# Patient Record
Sex: Female | Born: 1940 | Race: Black or African American | Hispanic: No | Smoking: Never smoker
Health system: Southern US, Community
[De-identification: ages and names within clinical notes are randomized; demographics above are authoritative.]

## PROBLEM LIST (undated history)

## (undated) ENCOUNTER — Ambulatory Visit: Disposition: A | Discharge status: Home / Self Care | Payer: PPO

## (undated) DIAGNOSIS — E119 Type 2 diabetes mellitus without complications: Secondary | ICD-10-CM

## (undated) DIAGNOSIS — I10 Essential (primary) hypertension: Secondary | ICD-10-CM

## (undated) DIAGNOSIS — M179 Osteoarthritis of knee, unspecified: Secondary | ICD-10-CM

## (undated) DIAGNOSIS — E785 Hyperlipidemia, unspecified: Secondary | ICD-10-CM

## (undated) DIAGNOSIS — D179 Benign lipomatous neoplasm, unspecified: Secondary | ICD-10-CM

## (undated) DIAGNOSIS — M1710 Unilateral primary osteoarthritis, unspecified knee: Secondary | ICD-10-CM

## (undated) HISTORY — DX: Essential (primary) hypertension: I10

## (undated) HISTORY — DX: Hyperlipidemia, unspecified: E78.5

## (undated) HISTORY — DX: Benign lipomatous neoplasm, unspecified: D17.9

## (undated) HISTORY — DX: Type 2 diabetes mellitus without complications: E11.9

## (undated) HISTORY — DX: Unilateral primary osteoarthritis, unspecified knee: M17.10

## (undated) HISTORY — DX: Osteoarthritis of knee, unspecified: M17.9

---
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
## 1968-09-24 HISTORY — PX: ACNE CYST REMOVAL: SUR1112

## 1983-09-25 HISTORY — PX: VAGINAL HYSTERECTOMY: SUR661

## 1983-09-25 HISTORY — PX: OOPHORECTOMY: SHX86

## 2002-12-08 ENCOUNTER — Observation Stay (HOSPITAL_COMMUNITY): Admission: EM | Admit: 2002-12-08 | Discharge: 2002-12-09 | Payer: Self-pay | Admitting: Emergency Medicine

## 2002-12-08 ENCOUNTER — Encounter: Payer: Self-pay | Admitting: Emergency Medicine

## 2002-12-09 ENCOUNTER — Encounter: Payer: Self-pay | Admitting: Internal Medicine

## 2003-04-20 ENCOUNTER — Encounter: Payer: Self-pay | Admitting: Emergency Medicine

## 2003-04-20 ENCOUNTER — Emergency Department (HOSPITAL_COMMUNITY): Admission: EM | Admit: 2003-04-20 | Discharge: 2003-04-20 | Payer: Self-pay | Admitting: Emergency Medicine

## 2004-09-27 ENCOUNTER — Ambulatory Visit: Payer: Self-pay | Admitting: Internal Medicine

## 2004-10-18 ENCOUNTER — Ambulatory Visit: Payer: Self-pay | Admitting: Internal Medicine

## 2004-10-26 ENCOUNTER — Ambulatory Visit: Payer: Self-pay | Admitting: Internal Medicine

## 2004-11-06 ENCOUNTER — Encounter: Admission: RE | Admit: 2004-11-06 | Discharge: 2005-02-04 | Payer: Self-pay | Admitting: Internal Medicine

## 2004-11-14 ENCOUNTER — Ambulatory Visit: Payer: Self-pay | Admitting: Internal Medicine

## 2004-11-21 ENCOUNTER — Encounter: Admission: RE | Admit: 2004-11-21 | Discharge: 2004-11-21 | Payer: Self-pay | Admitting: Internal Medicine

## 2005-01-31 ENCOUNTER — Ambulatory Visit: Payer: Self-pay | Admitting: Internal Medicine

## 2005-03-19 ENCOUNTER — Ambulatory Visit: Payer: Self-pay | Admitting: Internal Medicine

## 2005-04-02 ENCOUNTER — Ambulatory Visit: Payer: Self-pay | Admitting: Cardiology

## 2005-06-11 ENCOUNTER — Ambulatory Visit: Payer: Self-pay | Admitting: Internal Medicine

## 2005-06-18 ENCOUNTER — Ambulatory Visit: Payer: Self-pay | Admitting: Internal Medicine

## 2005-08-22 ENCOUNTER — Ambulatory Visit: Payer: Self-pay | Admitting: Internal Medicine

## 2006-04-22 ENCOUNTER — Ambulatory Visit: Payer: Self-pay | Admitting: Endocrinology

## 2006-05-13 ENCOUNTER — Ambulatory Visit: Payer: Self-pay | Admitting: Internal Medicine

## 2006-06-10 ENCOUNTER — Ambulatory Visit: Payer: Self-pay | Admitting: Internal Medicine

## 2006-09-09 ENCOUNTER — Ambulatory Visit: Payer: Self-pay | Admitting: Internal Medicine

## 2007-04-19 DIAGNOSIS — E119 Type 2 diabetes mellitus without complications: Secondary | ICD-10-CM | POA: Insufficient documentation

## 2007-04-19 DIAGNOSIS — I10 Essential (primary) hypertension: Secondary | ICD-10-CM | POA: Insufficient documentation

## 2007-04-19 DIAGNOSIS — M1990 Unspecified osteoarthritis, unspecified site: Secondary | ICD-10-CM | POA: Insufficient documentation

## 2007-04-19 DIAGNOSIS — J309 Allergic rhinitis, unspecified: Secondary | ICD-10-CM | POA: Insufficient documentation

## 2007-08-05 ENCOUNTER — Encounter: Payer: Self-pay | Admitting: Internal Medicine

## 2008-06-05 ENCOUNTER — Ambulatory Visit: Payer: Self-pay | Admitting: Family Medicine

## 2008-06-05 DIAGNOSIS — J019 Acute sinusitis, unspecified: Secondary | ICD-10-CM | POA: Insufficient documentation

## 2008-09-11 ENCOUNTER — Telehealth: Payer: Self-pay | Admitting: Family Medicine

## 2008-09-13 ENCOUNTER — Telehealth: Payer: Self-pay | Admitting: Internal Medicine

## 2008-09-15 ENCOUNTER — Ambulatory Visit: Payer: Self-pay | Admitting: Internal Medicine

## 2008-09-15 DIAGNOSIS — E785 Hyperlipidemia, unspecified: Secondary | ICD-10-CM | POA: Insufficient documentation

## 2008-10-05 ENCOUNTER — Telehealth: Payer: Self-pay | Admitting: Internal Medicine

## 2009-05-18 ENCOUNTER — Ambulatory Visit: Payer: Self-pay | Admitting: Internal Medicine

## 2009-05-18 ENCOUNTER — Telehealth: Payer: Self-pay | Admitting: Internal Medicine

## 2009-05-18 DIAGNOSIS — R05 Cough: Secondary | ICD-10-CM

## 2009-05-18 DIAGNOSIS — B351 Tinea unguium: Secondary | ICD-10-CM | POA: Insufficient documentation

## 2009-05-18 DIAGNOSIS — R059 Cough, unspecified: Secondary | ICD-10-CM | POA: Insufficient documentation

## 2009-05-18 LAB — CONVERTED CEMR LAB
ALT: 21 units/L (ref 0–35)
Alkaline Phosphatase: 77 units/L (ref 39–117)
BUN: 13 mg/dL (ref 6–23)
Bilirubin Urine: NEGATIVE
Bilirubin, Direct: 0.2 mg/dL (ref 0.0–0.3)
Chloride: 101 meq/L (ref 96–112)
Eosinophils Relative: 3 % (ref 0.0–5.0)
Glucose, Bld: 112 mg/dL — ABNORMAL HIGH (ref 70–99)
HCT: 36.2 % (ref 36.0–46.0)
MCV: 85.3 fL (ref 78.0–100.0)
Nitrite: NEGATIVE
Platelets: 266 10*3/uL (ref 150.0–400.0)
Potassium: 4.4 meq/L (ref 3.5–5.1)
RDW: 13.8 % (ref 11.5–14.6)
Specific Gravity, Urine: 1.015 (ref 1.000–1.030)
Total Protein, Urine: NEGATIVE mg/dL
Total Protein: 8.8 g/dL — ABNORMAL HIGH (ref 6.0–8.3)
pH: 6 (ref 5.0–8.0)

## 2009-05-19 ENCOUNTER — Encounter: Payer: Self-pay | Admitting: Internal Medicine

## 2009-05-25 LAB — HM DIABETES EYE EXAM: HM Diabetic Eye Exam: NORMAL

## 2009-11-02 ENCOUNTER — Ambulatory Visit: Payer: Self-pay | Admitting: Internal Medicine

## 2009-11-02 DIAGNOSIS — J45901 Unspecified asthma with (acute) exacerbation: Secondary | ICD-10-CM | POA: Insufficient documentation

## 2009-11-02 DIAGNOSIS — J209 Acute bronchitis, unspecified: Secondary | ICD-10-CM | POA: Insufficient documentation

## 2009-11-02 DIAGNOSIS — R1110 Vomiting, unspecified: Secondary | ICD-10-CM | POA: Insufficient documentation

## 2010-02-18 ENCOUNTER — Ambulatory Visit: Payer: Self-pay | Admitting: Family Medicine

## 2010-02-18 DIAGNOSIS — H6980 Other specified disorders of Eustachian tube, unspecified ear: Secondary | ICD-10-CM | POA: Insufficient documentation

## 2010-04-07 ENCOUNTER — Telehealth: Payer: Self-pay | Admitting: Internal Medicine

## 2010-04-17 ENCOUNTER — Telehealth: Payer: Self-pay | Admitting: Internal Medicine

## 2010-04-26 ENCOUNTER — Encounter: Payer: Self-pay | Admitting: Internal Medicine

## 2010-06-01 ENCOUNTER — Encounter: Payer: Self-pay | Admitting: Internal Medicine

## 2010-06-08 ENCOUNTER — Encounter: Payer: Self-pay | Admitting: Internal Medicine

## 2010-06-09 ENCOUNTER — Ambulatory Visit: Payer: Self-pay | Admitting: Internal Medicine

## 2010-06-12 LAB — CONVERTED CEMR LAB
AST: 20 units/L (ref 0–37)
Albumin: 4.1 g/dL (ref 3.5–5.2)
Alkaline Phosphatase: 72 units/L (ref 39–117)
Basophils Relative: 0.3 % (ref 0.0–3.0)
Bilirubin Urine: NEGATIVE
Bilirubin, Direct: 0.1 mg/dL (ref 0.0–0.3)
Eosinophils Relative: 1.3 % (ref 0.0–5.0)
GFR calc non Af Amer: 117.01 mL/min (ref >60)
Glucose, Bld: 139 mg/dL — ABNORMAL HIGH (ref 70–99)
Hemoglobin: 12.9 g/dL (ref 12.0–15.0)
Hgb A1c MFr Bld: 10 % — ABNORMAL HIGH (ref 4.6–6.5)
Leukocytes, UA: NEGATIVE
Lymphocytes Relative: 34.8 % (ref 12.0–46.0)
MCHC: 34 g/dL (ref 30.0–36.0)
Monocytes Relative: 4.8 % (ref 3.0–12.0)
Neutro Abs: 5.6 10*3/uL (ref 1.4–7.7)
Neutrophils Relative %: 58.8 % (ref 43.0–77.0)
Nitrite: NEGATIVE
Potassium: 4 meq/L (ref 3.5–5.1)
RBC: 4.41 M/uL (ref 3.87–5.11)
Sodium: 138 meq/L (ref 135–145)
Specific Gravity, Urine: >=1.03 (ref 1.000–1.030)
Total Protein: 8.4 g/dL — ABNORMAL HIGH (ref 6.0–8.3)
WBC: 9.5 10*3/uL (ref 4.5–10.5)
pH: 6 (ref 5.0–8.0)

## 2010-06-16 ENCOUNTER — Ambulatory Visit: Payer: Self-pay | Admitting: Internal Medicine

## 2010-06-16 ENCOUNTER — Telehealth: Payer: Self-pay | Admitting: Internal Medicine

## 2010-09-13 ENCOUNTER — Telehealth: Payer: Self-pay | Admitting: Internal Medicine

## 2010-10-19 ENCOUNTER — Telehealth: Payer: Self-pay | Admitting: Internal Medicine

## 2010-10-24 NOTE — Assessment & Plan Note (Signed)
Summary: EAR ACHE//VGJ   Vital Signs:  Patient profile:   70 year old female Weight:      219 pounds Temp:     97.5 degrees F oral Pulse rate:   74 / minute BP sitting:   120 / 80  (left arm)  Vitals Entered By: Doristine Devoid (Feb 18, 2010 10:56 AM) CC: R ear pain   Acute Visit History:      The patient complains of earache, sinus problems, and sore throat.  These symptoms began 5 days ago.  She denies chest pain, cough, and fever.  Other comments include: Pain from ear runs to right face and down neck  mild cough continues from early April (treated for brnchitis.Marland Kitchenusing albuterol as needed) Occ using veramyst, but not consoistently.        The earache is located on the right side.        She complains of ears being blocked.        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 1po Once Daily 2)  Ibuprofen 600 Mg  Tabs (Ibuprofen) .... Two Times A Day Pc As Needed 3)  Promethazine-Dm 6.25-15 Mg/6ml Syrp (Promethazine-Dm) .... 5-10 Ml By Mouth Qid As Needed Cough 4)  Astepro 137 Mcg/spray Soln (Azelastine Hcl) .... 2 Sprays Each Nostril Two Times A Day 5)  Veramyst 27.5 Mcg/spray Susp (Fluticasone Furoate) 6)  Adult Aspirin Ec Low Strength 81 Mg Tbec (Aspirin) .[name].. 1po Once Daily 7)  Actos 45 Mg Tabs (Pioglitazone Hcl) .[name].. 1po Once Daily 8)  Hyzaar 100-25 Mg Tabs (Losartan Potassium-Hctz) .[name].. 1 By Mouth Qd 9)  Proair Hfa 108 (90 Base) Mcg/act Aers (Albuterol Sulfate) .... 2 Inh Qid As Needed 10)   Tussicaps 10-8 Mg Xr12h-Cap (Hydrocod Polst-Chlorphen Polst) .[name].. 1 By Mouth Two Times A Day As Needed Cough 11)  Proair Hfa 108 (90 Base) Mcg/act Aers (Albuterol Sulfate) .... 2 Inh Qid As Needed 12)  Promethazine Hcl 25 Mg Tabs (Promethazine Hcl) .[name].. 1-2 By Mouth Four Times A Day As Needed Nausea  Allergies (verified): 1)  ! Codeine Sulfate (Codeine Sulfate) 2)  ! Aspirin (Aspirin) 3)  ! Darvocet 4)  Metformin Hcl (Metformin Hcl)  Review of Systems General:  Denies fatigue and fever. CV:  Denies chest pain or discomfort. Resp:  Denies shortness of breath.  Physical Exam  General:  Well-developed,well-nourished,in no acute distress; alert,appropriate and cooperative throughout examination Head:  no maxillary sinus pain, mil dpain more laterally near ear.  Eyes:  No corneal or conjunctival inflammation noted. EOMI. Perrla. Funduscopic exam benign, without hemorrhages, exudates or papilledema. Vision grossly normal. Ears:  clear fluid B TMs Nose:  nasal dischargemucosal pallor.   Mouth:  MMM Neck:  no carotid bruit or thyromegaly no cervical or supraclavicular lymphadenopathy  Chest Wall:  lipoma on upper sternum, no tenderness.   Lungs:  Normal respiratory effort, chest expands symmetrically. Lungs are clear to auscultation, no crackles or wheezes. Heart:  Normal rate and regular rhythm. S1 and S2 normal without gallop, murmur, click, rub or  other extra sounds. 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 If not improving call.   Problem # 2:  ALLERGIC RHINITIS (ICD-477.9) Start oral antihistamine and daily nasal steroid.  The following medications were removed from the medication list:    Astepro 137 Mcg/spray Soln (Azelastine hcl) .[name]... 2 sprays each  nostril two times a day Her updated medication list for this problem includes:    Veramyst 27.5 Mcg/spray Susp (Fluticasone furoate)    Promethazine Hcl 25 Mg Tabs (Promethazine hcl) .[name]... 1-2 by mouth four times a day as needed nausea  Problem # 3:  DIABETES MELLITUS, TYPE II (ICD-250.00) Not taking actos because of cost. Wishes to have generic.Marland Kitchentoled her no generic equivalent.  Call primary MD next week to discuss next step of treatment. Counseled on exercsie , weight loss and healthy eating habits.  Her updated medication list for this problem includes:    Glimepiride 4 Mg Tabs (Glimepiride) .[name]... 1po once daily    Adult Aspirin Ec Low Strength 81 Mg Tbec (Aspirin) .[name]... 1po once daily    Actos 45 Mg Tabs (Pioglitazone hcl) .[name]... 1po once daily    Hyzaar 100-25 Mg Tabs (Losartan potassium-hctz) .[name]... 1 by mouth qd  Complete Medication List: 1)  Glimepiride 4 Mg Tabs (Glimepiride) .[name].. 1po once daily 2)  Ibuprofen 600 Mg Tabs (Ibuprofen) .... Two times a day pc as needed 3)  Promethazine-dm 6.25-15 Mg/68ml Syrp (Promethazine-dm) .... 5-10 ml by mouth qid as needed cough 4)  Veramyst 27.5 Mcg/spray Susp (Fluticasone furoate) 5)  Adult Aspirin Ec Low Strength 81 Mg Tbec (Aspirin) .[name].. 1po once daily 6)  Actos 45 Mg Tabs (Pioglitazone hcl) .[name].. 1po once daily 7)  Hyzaar 100-25 Mg Tabs (Losartan potassium-hctz) .[name].. 1 by mouth qd 8)  Proair Hfa 108 (90 Base) Mcg/act Aers (Albuterol sulfate) .... 2 inh qid as needed 9)  Tussicaps 10-8 Mg Xr12h-cap (Hydrocod polst-chlorphen polst) .[name].. 1 by mouth two times a day as needed cough 10)  Promethazine Hcl 25 Mg Tabs (Promethazine hcl) .[name].. 1-2 by mouth four times a day as needed nausea  Patient Instructions: 1)  Start nasal saline irrigaton 2-3 times daily. MAke sure to do prior to nasal steroid. 2)  Veramyst 2 spray per nostril daily. 3)  Can try OTC claritin daily if not improving as expected.  4)  Ibuprofen as neede for pain. 5)  Call if not  improving in 5-7 days.

## 2010-10-24 NOTE — Assessment & Plan Note (Signed)
Summary: dry cough,chest sore,worsening-lb   Vital Signs:  Patient profile:   70 year old female Weight:      216 pounds Temp:     98 degrees F oral Pulse rate:   88 / minute BP sitting:   156 / 74  (left arm)  Vitals Entered By: Tora Perches (November 02, 2009 3:49 PM) CC: dry cough ,chest soreness Is Patient Diabetic? Yes   Primary Care Provider:  Plotnikov  CC:  dry cough  and chest soreness.  History of Present Illness: The patient presents with complaints of sore throat, fever, cough, sinus congestion and drainge of several days duration. Not better with OTC meds. Chest hurts with coughing. Can't sleep due to cough. Muscle aches are present.  The mucus is colored. Started to have a severe cough w/vomiting today, CP and chocking  Preventive Screening-Counseling & Management  Alcohol-Tobacco     Smoking Status: never  Current Medications (verified): 1)  Lotensin Hct 20-25 Mg  Tabs (Benazepril-Hydrochlorothiazide) .... Two Times A Day 2)  Glimepiride 4 Mg Tabs (Glimepiride) .[name].. 1po Once Daily 3)  Ibuprofen 600 Mg  Tabs (Ibuprofen) .... Two Times A Day Pc As Needed 4)  Promethazine-Dm 6.25-15 Mg/55ml Syrp (Promethazine-Dm) .... 5-10 Ml By Mouth Qid As Needed Cough 5)  Astepro 137 Mcg/spray Soln (Azelastine Hcl) .... 2 Sprays Each Nostril Two Times A Day 6)  Veramyst 27.5 Mcg/spray Susp (Fluticasone Furoate) 7)  Adult Aspirin Ec Low Strength 81 Mg Tbec (Aspirin) .[name].. 1po Once Daily 8)  Actos 45 Mg Tabs (Pioglitazone Hcl) .[name].. 1po Once Daily  Allergies: 1)  ! Codeine Sulfate (Codeine Sulfate) 2)  ! Aspirin (Aspirin) 3)  ! Darvocet 4)  Metformin Hcl (Metformin Hcl)  Past History:  Past Medical History: Last updated: 09/15/2008 Allergic rhinitis Diabetes mellitus, type II Hypertension Osteoarthritis knees Hyperlipidemia lipoma anterior upper chest  Past Surgical History: Last updated: 09/15/2008 Hysterectomy  ( complete)-85 Oophorectomy - 1985 Appendectomy  1963 cyst off the back 1970's  Family History: Last updated: 09/15/2008 father with brain cancer mother died age 6 yo with DM, MI 5 sibs - several with DM, HTN neice - brain aneurysm brother with CAD  Social History: Last updated: 05/18/2009 work - [employer] widowed Never Smoked Alcohol use-no Drug use-no Regular exercise-no  Physical Exam  General:  alert, well-developed, well-nourished, well-hydrated, cooperative to examination, good hygiene, and overweight-appearing.   Nose:  nasal discharge, mucosal erythema, and mucosal edema.   Mouth:  Oral mucosa and oropharynx without lesions or exudates.  Teeth in good repair. Lungs:  Normal respiratory effort, chest expands symmetrically. Lungs are clear to auscultation, no crackles or wheezes. Heart:  Normal rate and regular rhythm. S1 and S2 normal without gallop, murmur, click, rub or other extra sounds. Abdomen:  Bowel sounds positive,abdomen soft and non-tender without masses, organomegaly or hernias noted. Msk:  No deformity or scoliosis noted of thoracic or lumbar spine.   Neurologic:  alert & oriented X3.   Skin:  Intact without suspicious lesions or rashes   Impression & Recommendations:  Problem # 1:  COUGH (ICD-786.2) Assessment New d/c ACE Orders: T-2 View CXR, Same Day (71020.5TC)  Problem # 2:  ASTHMA, WITH ACUTE EXACERBATION (ICD-493.92) Assessment: Deteriorated  Her updated medication list for this problem includes:    Proair Hfa 108 (90 Base) Mcg/act Aers (Albuterol sulfate) .[name]... 2 inh qid as needed  Problem # 3:  VOMITING (ICD-787.03) Assessment: New  Problem # 4:  BRONCHITIS, ACUTE (ICD-466.0)  Assessment: New  Her updated medication list for this problem includes:    Promethazine-dm 6.25-15 Mg/36ml Syrp (Promethazine-dm) .[name]... 5-10 ml by mouth qid as needed cough    Proair Hfa 108 (90 Base) Mcg/act Aers (Albuterol sulfate) .[name]... 2 inh qid as needed    Tussicaps 10-8 Mg Xr12h-cap (Hydrocod  polst-chlorphen polst) .[name]... 1 by mouth two times a day as needed cough  Orders: Depo- Medrol 40mg  (J1030) Depo- Medrol 80mg  (J1040) Admin of Therapeutic Inj  intramuscular or subcutaneous (21308) Promethazine up to 50mg  (J2550) Demerol  100mg   Injection (M5784)  Complete Medication List: 1)  Glimepiride 4 Mg Tabs (Glimepiride) .[name].. 1po once daily 2)  Ibuprofen 600 Mg Tabs (Ibuprofen) .... Two times a day pc as needed 3)  Promethazine-dm 6.25-15 Mg/3ml Syrp (Promethazine-dm) .... 5-10 ml by mouth qid as needed cough 4)  Astepro 137 Mcg/spray Soln (Azelastine hcl) .... 2 sprays each nostril two times a day 5)  Veramyst 27.5 Mcg/spray Susp (Fluticasone furoate) 6)  Adult Aspirin Ec Low Strength 81 Mg Tbec (Aspirin) .[name].. 1po once daily 7)  Actos 45 Mg Tabs (Pioglitazone hcl) .[name].. 1po once daily 8)  Hyzaar 100-25 Mg Tabs (Losartan potassium-hctz) .[name].. 1 by mouth qd 9)  Proair Hfa 108 (90 Base) Mcg/act Aers (Albuterol sulfate) .... 2 inh qid as needed 10)  Tussicaps 10-8 Mg Xr12h-cap (Hydrocod polst-chlorphen polst) .[name].. 1 by mouth two times a day as needed cough 11)  Proair Hfa 108 (90 Base) Mcg/act Aers (Albuterol sulfate) .... 2 inh qid as needed 12)  Promethazine Hcl 25 Mg Tabs (Promethazine hcl) .[name].. 1-2 by mouth four times a day as needed nausea  Patient Instructions: 1)  Stop Lotensin HCT 2)  Start Hyzaar instead 3)  Call if you are not better in a reasonable amount of time or if worse. Go to ER if feeling really bad!  4)  Use over-the-counter medicines for "cold": Tylenol  650mg  or Advil 400mg  every 6 hours  for fever; Delsym or Robutussin for cough. Mucinex or Mucinex D for congestion. Ricola or Halls for sore throat. Prescriptions: IBUPROFEN 600 MG  TABS (IBUPROFEN) two times a day pc as needed  #60 x 1   Entered and Authorized by:   Tresa Garter MD   Signed by:   Tresa Garter MD on 11/02/2009   Method used:   Print then Give to Patient   RxID:    [id number] ZITHROMAX Z-PAK 250 MG TABS (AZITHROMYCIN) as dirrected  #1 x 0   Entered and Authorized by:   Tresa Garter MD   Signed by:   Tresa Garter MD on 11/02/2009   Method used:   Print then Give to Patient   RxID:   [id number] PROMETHAZINE HCL 25 MG TABS (PROMETHAZINE HCL) 1-2 by mouth four times a day as needed nausea  #60 x 1   Entered and Authorized by:   Tresa Garter MD   Signed by:   Tresa Garter MD on 11/02/2009   Method used:   Print then Give to Patient   RxID:   [id number] PROAIR HFA 108 (90 BASE) MCG/ACT AERS (ALBUTEROL SULFATE) 2 inh qid as needed  #1 x 3   Entered and Authorized by:   Tresa Garter MD   Signed by:   Tresa Garter MD on 11/02/2009   Method used:   Print then Give to Patient   RxID:   [id number] TUSSICAPS 10-8 MG XR12H-CAP (HYDROCOD POLST-CHLORPHEN POLST) 1 by  mouth two times a day as needed cough  #20 x 0   Entered and Authorized by:   Tresa Garter MD   Signed by:   Tresa Garter MD on 11/02/2009   Method used:   Print then Give to Patient   RxID:   [id number] PROAIR HFA 108 (90 BASE) MCG/ACT AERS (ALBUTEROL SULFATE) 2 inh qid as needed  #3 x 3   Entered and Authorized by:   Tresa Garter MD   Signed by:   Tresa Garter MD on 11/02/2009   Method used:   Print then Give to Patient   RxID:   [id number] MWNUUV 100-25 MG TABS (LOSARTAN POTASSIUM-HCTZ) 1 by mouth qd  #30 x 12   Entered and Authorized by:   Tresa Garter MD   Signed by:   Tresa Garter MD on 11/02/2009   Method used:   Print then Give to Patient   RxID:   [id number]    Medication Administration  Injection # 1:    Medication: Depo- Medrol 40mg     Diagnosis: BRONCHITIS, ACUTE (ICD-466.0)    Route: IM    Site: RUOQ gluteus    Exp Date: 06/2010    Lot #: [id number]    Mfr: teva    Patient tolerated injection without complications    Given by: Tora Perches (November 02, 2009 4:36 PM)  Injection # 2:    Medication: Depo- Medrol 80mg     Diagnosis: BRONCHITIS, ACUTE (ICD-466.0)    Route: IM    Site: RUOQ gluteus    Exp Date: 06/2010    Lot #: [id number]    Mfr: teva    Patient tolerated injection without complications    Given by: Tora Perches (November 02, 2009 4:36 PM)  Injection # 3:    Medication: Promethazine up to 50mg     Diagnosis: BRONCHITIS, ACUTE (ICD-466.0)    Route: IM    Site: LUOQ gluteus    Exp Date: 12/2009    Lot #: [id number]    Mfr: [email or web address]    Comments: 50mg  given    Patient tolerated injection without complications    Given by: Tora Perches (November 02, 2009 4:38 PM)  Injection # 4:    Medication: Demerol  100mg   Injection    Diagnosis: BRONCHITIS, ACUTE (ICD-466.0)    Route: IM    Site: LUOQ gluteus    Exp Date: 11/23/2010    Lot #: [id number]    Mfr: hospira    Comments: 50 mg given    Patient tolerated injection without complications    Given by: Tora Perches (November 02, 2009 4:41 PM)  Orders Added: 1)  Depo- Medrol 40mg  [J1030] 2)  Depo- Medrol 80mg  [J1040] 3)  Admin of Therapeutic Inj  intramuscular or subcutaneous [96372] 4)  Promethazine up to 50mg  [J2550] 5)  Demerol  100mg   Injection [J2175] 6)  T-2 View CXR, Same Day [71020.5TC] 7)  Est. Patient Level IV [32355]

## 2010-10-24 NOTE — Letter (Signed)
Summary: Generic Letter  [facility]  [address] [place], [place] [address]   Phone: [phone]  Fax: [phone]    06/08/2010  RE: QUINTANA CANELO [address] [place], [place]  [address]  Dear Dr Wynelle Cleveland,  I got your letter from 9/12 re: Ms. Gloeckner surgical clearance. I have not seen her since 11/02/09. Let me bring her for an office visit and labs to check her out first. I will let you know ASAP.     Thank you!           Sincerely,   Jacinta Shoe MD

## 2010-10-24 NOTE — Progress Notes (Signed)
Summary: Rf Diabetic Meds/Plot pt  Phone Note From Pharmacy   Caller: [facility] [address]  ZO[facility][phone] Summary of Call: rec fax from pharm: pt is requesting Rf on diabetic meds. We have Actos and Glimeperide listed in EMR. Per pharmacy she has not been filling any diabetic meds recently.  Tried to call pt to verify whether she needs this.....no answer.ok to refill? Initial call taken by: Lanier Prude, [facility]),  April 07, 2010 3:48 PM  Follow-up for Phone Call        ok to refill, then needs ROV in 6 wks with Dr Posey Rea  with:  hgba1c, bmet, and lipids prior - 250.02 Follow-up by: Corwin Levins MD,  April 07, 2010 4:39 PM    Prescriptions: ACTOS 45 MG TABS (PIOGLITAZONE HCL) 1po once daily  #90 x 1   Entered by:   Lamar Sprinkles, CMA   Authorized by:   Tresa Garter MD   Signed by:   Lamar Sprinkles, CMA on 04/07/2010   Method used:   Electronically to        [facility]  [address] [id number] ([facility])       [address]       [place], [place]  [address]       Ph: [phone] or [phone]       Fax: [phone]   RxID:   [id number] GLIMEPIRIDE 4 MG TABS (GLIMEPIRIDE) 1po once daily  #90 x 1   Entered by:   Lamar Sprinkles, CMA   Authorized by:   Tresa Garter MD   Signed by:   Lamar Sprinkles, CMA on 04/07/2010   Method used:   [facility] to        [facility]  [address] [id number]       [address]       [place], [place]  [address]       Ph: [phone] or [phone]       Fax: [phone]   RxID:   [id number]

## 2010-10-24 NOTE — Assessment & Plan Note (Signed)
Summary: medical clearance-lb   Vital Signs:  Patient profile:   70 year old female Height:      67 inches Weight:      220 pounds BMI:     34.58 Temp:     97.2 degrees F oral Pulse rate:   72 / minute Pulse rhythm:   regular Resp:     16 per minute BP sitting:   144 / 76  (left arm) Cuff size:   large  Vitals Entered By: Lanier Prude, Beverly Gust) (June 09, 2010 4:43 PM) CC: med clearance for phenol matrixectomy  Is Patient Diabetic? Yes Comments she is not taking Actos regularly.  She is not taking Tussicaps, Promethazine-DM, Tussicaps or Onglyza.     Primary Care Provider:  [facility]nikov  CC:  med clearance for phenol matrixectomy .  History of Present Illness: IM Consult Req: by Dr Wynelle Cleveland Reason: Preop phenol matrixectomy The patient presents for a follow up of hypertension- stable, diabetes - worse, hyperlipidemia  - stable Her CBGs -are high at home She has been taking Actos sporadically after she read that "it can kill you".  Preventive Screening-Counseling & Management  Alcohol-Tobacco     Smoking Status: never  Current Medications (verified): 1)  Ibuprofen 600 Mg  Tabs (Ibuprofen) .... Two Times A Day Pc As Needed 2)  Promethazine-Dm 6.25-15 Mg/8ml Syrp (Promethazine-Dm) .... 5-10 Ml By Mouth Qid As Needed Cough 3)  Veramyst 27.5 Mcg/spray Susp (Fluticasone Furoate) 4)  Adult Aspirin Ec Low Strength 81 Mg Tbec (Aspirin) .[name].. 1po Once Daily 5)  Actos 45 Mg Tabs (Pioglitazone Hcl) .[name].. 1po Once Daily 6)  Hyzaar 100-25 Mg Tabs (Losartan Potassium-Hctz) .[name].. 1 By Mouth Qd 7)  Proair Hfa 108 (90 Base) Mcg/act Aers (Albuterol Sulfate) .... 2 Inh Qid As Needed 8)  Tussicaps 10-8 Mg Xr12h-Cap (Hydrocod Polst-Chlorphen Polst) .[name].. 1 By Mouth Two Times A Day As Needed Cough 9)  Promethazine Hcl 25 Mg Tabs (Promethazine Hcl) .[name].. 1-2 By Mouth Four Times A Day As Needed Nausea 10)  Onglyza 5 Mg Tabs (Saxagliptin Hcl) .[name].. 1 By Mouth Once Daily For  Diabetes  Allergies (verified): 1)  ! Codeine Sulfate (Codeine Sulfate) 2)  ! Aspirin (Aspirin) 3)  ! Darvocet 4)  Metformin Hcl (Metformin Hcl) 5)  Glimepiride (Glimepiride)  Past History:  Past Medical History: Last updated: 09/15/2008 Allergic rhinitis Diabetes mellitus, type II Hypertension Osteoarthritis knees Hyperlipidemia lipoma anterior upper chest  Past Surgical History: Last updated: 09/15/2008 Hysterectomy  ( complete)-85 Oophorectomy - 1985 Appendectomy 1963 cyst off the back 1970's  Family History: Last updated: 09/15/2008 father with brain cancer mother died age 63 yo with DM, MI 5 sibs - several with DM, HTN neice - brain aneurysm brother with CAD  Social History: Last updated: 05/18/2009 work - [employer] widowed Never Smoked Alcohol use-no Drug use-no Regular exercise-no  Review of Systems  The patient denies anorexia, fever, weight loss, weight gain, vision loss, decreased hearing, hoarseness, chest pain, syncope, dyspnea on exertion, peripheral edema, prolonged cough, headaches, hemoptysis, abdominal pain, melena, hematochezia, severe indigestion/heartburn, hematuria, incontinence, genital sores, muscle weakness, suspicious skin lesions, transient blindness, difficulty walking, depression, unusual weight change, abnormal bleeding, enlarged lymph nodes, angioedema, and breast masses.    Physical Exam  General:  Well-developed,overweight-appearing.  NAD Head:  Normocephalic and atraumatic without obvious abnormalities. No apparent alopecia or balding. Eyes:  No corneal or conjunctival inflammation noted. EOMI. Perrla. Funduscopic exam benign, without hemorrhages, exudates or papilledema. Vision grossly normal. Ears:  clear fluid B TMs Nose:  No nasal discharge mucosal pallor.   Mouth:  MMM Neck:  No deformities, masses, or tenderness noted. 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 Lungs are clear to auscultation, no crackles or wheezes. Heart:  Normal rate and regular rhythm. S1 and S2 normal without gallop, murmur, click, rub or other extra sounds. Abdomen:  Bowel sounds positive,abdomen soft and non-tender without masses, organomegaly or hernias noted. Msk:  No deformity or scoliosis noted of thoracic or lumbar spine.  L great toenail deformed from injury Pulses:  R and L carotid,radial,femoral,dorsalis pedis and posterior tibial pulses are full and equal bilaterally Extremities:  trace ankle edema Neurologic:  alert & oriented X3.   Skin:  lipoma on centrl chest Psych:  Cognition and judgment appear intact. Alert and cooperative with normal attention span and concentration. No apparent delusions, illusions, hallucinations   Impression & Recommendations:  Problem # 1:  PREOPERATIVE EXAMINATION (ICD-V72.84) Assessment New Labs is pending. She should be good for surgery in 2 wks when her CBGs start to look better hopefully.  Orders: TLB-BMP (Basic Metabolic Panel-BMET) (80048-METABOL) TLB-A1C / Hgb A1C (Glycohemoglobin) (83036-A1C) TLB-CBC Platelet - w/Differential (85025-CBCD) TLB-Hepatic/Liver Function Pnl (80076-HEPATIC) TLB-TSH (Thyroid Stimulating Hormone) (84443-TSH) TLB-Udip ONLY (81003-UDIP) Thank you!  Problem # 2:  DIABETES MELLITUS, TYPE II (ICD-250.00) Assessment: Deteriorated  Risks of noncompliance with treatment discussed. Compliance encouraged.  She stopped Actos due to Pharm letters saying "it can kill"    Onglyza 5 Mg Tabs (Saxagliptin hcl) .[name]... 1 by mouth once daily for diabetes  Orders: TLB-BMP (Basic Metabolic Panel-BMET) (80048-METABOL) TLB-A1C / Hgb A1C (Glycohemoglobin) (83036-A1C) TLB-CBC Platelet - w/Differential (85025-CBCD) TLB-Hepatic/Liver Function Pnl (80076-HEPATIC) TLB-TSH (Thyroid Stimulating Hormone) (84443-TSH) TLB-Udip ONLY (81003-UDIP) Tests: (1) BMP (METABOL)    Sodium                    138 mEq/L                   135-145   Potassium                 4.0 mEq/L                   3.5-5.1   Chloride                  101 mEq/L                   96-112   Carbon Dioxide            29 mEq/L                    19-32   Glucose              [H]  139 mg/dL                   04-54   BUN                       17 mg/dL                    0-98   Creatinine                0.7 mg/dL  0.4-1.2   Calcium                   9.2 mg/dL                   5.6-43.3   GFR                       117.01 mL/min               >60  Tests: (2) Hemoglobin A1C (A1C)   Hemoglobin A1C       [H]  10.0 %                      4.6-6.5  Problem # 3:  HYPERLIPIDEMIA (ICD-272.4) Assessment: Comment Only  Orders: TLB-BMP (Basic Metabolic Panel-BMET) (80048-METABOL) TLB-A1C / Hgb A1C (Glycohemoglobin) (83036-A1C) TLB-CBC Platelet - w/Differential (85025-CBCD) TLB-Hepatic/Liver Function Pnl (80076-HEPATIC) TLB-TSH (Thyroid Stimulating Hormone) (84443-TSH) TLB-Udip ONLY (81003-UDIP)  Problem # 4:  HYPERTENSION (ICD-401.9) Assessment: Unchanged  Her updated medication list for this problem includes:    Hyzaar 100-25 Mg Tabs (Losartan potassium-hctz) .[name]... 1 by mouth qd  Orders: TLB-BMP (Basic Metabolic Panel-BMET) (80048-METABOL) TLB-A1C / Hgb A1C (Glycohemoglobin) (83036-A1C) TLB-CBC Platelet - w/Differential (85025-CBCD) TLB-Hepatic/Liver Function Pnl (80076-HEPATIC) TLB-TSH (Thyroid Stimulating Hormone) (84443-TSH) TLB-Udip ONLY (81003-UDIP)  Complete Medication List: 1)  Ibuprofen 600 Mg Tabs (Ibuprofen) .... Two times a day pc as needed 2)  Veramyst 27.5 Mcg/spray Susp (Fluticasone furoate) 3)  Hyzaar 100-25 Mg Tabs (Losartan potassium-hctz) .[name].. 1 by mouth qd 4)  Proair Hfa 108 (90 Base) Mcg/act Aers (Albuterol sulfate) .... 2 inh qid as needed 5)  Promethazine Hcl 25 Mg Tabs (Promethazine hcl) .[name].. 1-2 by mouth four times a day as needed nausea 6)  Onglyza  5 Mg Tabs (Saxagliptin hcl) .[name].. 1 by mouth once daily for diabetes 7)  Adult Aspirin Ec Low Strength 81 Mg Tbec (Aspirin) .[name].. 1po once daily  Patient Instructions: 1)  Please schedule a follow-up appointment in 2 wks Prescriptions: PROAIR HFA 108 (90 BASE) MCG/ACT AERS (ALBUTEROL SULFATE) 2 inh qid as needed  #3 x 3   Entered and Authorized by:   Tresa Garter MD   Signed by:   Tresa Garter MD on 06/09/2010   Method used:   Print then Give to Patient   RxID:   [id number] ONGLYZA 5 MG TABS (SAXAGLIPTIN HCL) 1 by mouth once daily for diabetes  #30 x 12   Entered and Authorized by:   Tresa Garter MD   Signed by:   Tresa Garter MD on 06/09/2010   Method used:   Print then Give to Patient   RxID:   [id number]

## 2010-10-24 NOTE — Letter (Signed)
Summary: Primary Care Appointment Letter  [facility]  [address] [place], [place] [address]   Phone: [phone]  Fax: [phone]    06/01/2010 MRN: [id number]  [facility] [address] [place], [place]  [address]  Dear Ms. Verhagen,   Your Primary Care Physician Tresa Garter MD has indicated that:    ___X____it is time to schedule an appointment with Dr. Posey Rea. Please call the office to schedule.   _______you missed your appointment on______ and need to call and          reschedule.    _______you need to have lab work done.    _______you need to schedule an appointment discuss lab or test results.    _______you need to call to reschedule your appointment that is                       scheduled on _________.     Please call our office as soon as possible. Our phone number is [phone]. Please press option 1. Our office is open 8a-12noon and 1p-5p, Monday through Friday.     Thank you,    [facility]

## 2010-10-24 NOTE — Letter (Signed)
Summary: Generic Letter  [facility]  [address] [place], [place] [address]   Phone: [phone]  Fax: [phone]    04/26/2010  [facility] [address] [place], [place]  [address]  Dear Ms. Sopher,           Sincerely,   Lanier Prude, [facility])

## 2010-10-24 NOTE — Letter (Signed)
Summary: Generic Letter  [facility]  [address] [place], [place] [address]   Phone: [phone]  Fax: [phone]    04/26/2010  [facility] [address] [place], [place]  [address]  Dear Ms. Bures,  We have been trying to contact you regarding your health concerns.  Please contact our office so that we can better assist you with these issues.  Also, please be advised you have samples and a new prescription at our office that you may pick up any time.      Sincerely,   Lanier Prude, [facility]) for Dr. Posey Rea

## 2010-10-24 NOTE — Progress Notes (Signed)
Summary: blood sugar--lm 7-25  Phone Note Call from Patient Call back at [facility] Phone [phone]   Summary of Call: Patient is having issues with her blood sugar. Please call.  Left message on machine to call back to office. Initial call taken by: Lucious Groves CMA,  April 17, 2010 8:20 AM  Follow-up for Phone Call        Pt is concerned about elevated cbgs. She exercises 14min/day -walks on treadmill & dance exercise.   Fasting cbgs have been in 200's. She gets afraid to eat when cbgs are elevated. No new meds.   Patient does not take glimepride. She states that the first pill she took gave her very bad diarrhea. She stopped med and only takes actos 45mg  once daily. Please advise.  Follow-up by: Lamar Sprinkles, CMA,  April 18, 2010 5:44 PM  Additional Follow-up for Phone Call Additional follow up Details #1::        Was it not Metformin that gave her diarrhea? Additional Follow-up by: Tresa Garter MD,  April 18, 2010 6:04 PM   New Allergies: GLIMEPIRIDE (GLIMEPIRIDE) Additional Follow-up for Phone Call Additional follow up Details #2::    No, I specifically asked about metformin. She states that she never has had metformin.....................Marland KitchenLamar Sprinkles, CMA  April 18, 2010 6:34 PM   She took Metformin and it gave her diarrhea. We can try to add Onglyza - 5 mg - samples. RTC 3 wks Follow-up by: Tresa Garter MD,  April 19, 2010 1:19 PM  Additional Follow-up for Phone Call Additional follow up Details #3:: Details for Additional Follow-up Action Taken: Onglyza samples and Rx put upfront...left mess for pt to call back to inform pt of above...Marland KitchenMarland KitchenLanier Prude, [facility])  April 19, 2010 4:42 PM   left mess for pt to call back .........Marland KitchenLanier Prude, [facility])  April 20, 2010 1:20 PM   left mess for pt to call back ..............Marland KitchenLanier Prude, [facility])  April 21, 2010 11:53 AM   No answer.Marland KitchenMarland KitchenMarland KitchenLanier Prude, [facility])  April 26, 2010 8:15 AM   Multiple unsucessful  attempts to contact pt.  Will mail letter to inform of above. Additional Follow-up by: Lanier Prude, [facility]),  April 26, 2010 8:24 AM  New/Updated Medications: ONGLYZA 5 MG TABS (SAXAGLIPTIN HCL) 1 by mouth once daily for diabetes New Allergies: GLIMEPIRIDE (GLIMEPIRIDE)Prescriptions: ONGLYZA 5 MG TABS (SAXAGLIPTIN HCL) 1 by mouth once daily for diabetes  #30 x 12   Entered and Authorized by:   Tresa Garter MD   Signed by:   Tresa Garter MD on 04/19/2010   Method used:   Print then Give to Patient   RxID:   [id number]

## 2010-10-24 NOTE — Assessment & Plan Note (Signed)
Summary: cough,cold,back pain/cd   Vital Signs:  Patient profile:   70 year old female Height:      67 inches Weight:      220 pounds BMI:     34.58 Temp:     97.7 degrees F oral Pulse rate:   80 / minute Pulse rhythm:   regular Resp:     16 per minute BP sitting:   148 / 72  (left arm) Cuff size:   large  Vitals Entered By: Lanier Prude, CMA(AAMA) (June 16, 2010 4:02 PM) CC: dry cough, soer chest, not sleeping well X 1wk Is Patient Diabetic? Yes   Primary Care Provider:  Plotnikov  CC:  dry cough, soer chest, and not sleeping well X 1wk.  History of Present Illness: The patient presents with complaints of sore throat, fever, cough, sinus congestion and drainge of several days duration. Not better with OTC meds. Chest hurts with coughing. Can't sleep due to cough. Muscle aches are present.  The mucus is colored.   Current Medications (verified): 1)  Ibuprofen 600 Mg  Tabs (Ibuprofen) .... Two Times A Day Pc As Needed 2)  Veramyst 27.5 Mcg/spray Susp (Fluticasone Furoate) 3)  Hyzaar 100-25 Mg Tabs (Losartan Potassium-Hctz) .[name].. 1 By Mouth Qd 4)  Proair Hfa 108 (90 Base) Mcg/act Aers (Albuterol Sulfate) .... 2 Inh Qid As Needed 5)  Promethazine Hcl 25 Mg Tabs (Promethazine Hcl) .[name].. 1-2 By Mouth Four Times A Day As Needed Nausea 6)  Onglyza 5 Mg Tabs (Saxagliptin Hcl) .[name].. 1 By Mouth Once Daily For Diabetes 7)  Adult Aspirin Ec Low Strength 81 Mg Tbec (Aspirin) .[name].. 1po Once Daily  Allergies (verified): 1)  ! Codeine Sulfate (Codeine Sulfate) 2)  ! Aspirin (Aspirin) 3)  ! Darvocet 4)  Metformin Hcl (Metformin Hcl) 5)  Glimepiride (Glimepiride)  Past History:  Past Medical History: Last updated: 09/15/2008 Allergic rhinitis Diabetes mellitus, type II Hypertension Osteoarthritis knees Hyperlipidemia lipoma anterior upper chest  Social History: Last updated: 05/18/2009 work - [employer] widowed Never Smoked Alcohol use-no Drug use-no Regular  exercise-no  Review of Systems       CBGs better  Physical Exam  General:  NAD Coughing a lot Mouth:  Erythematous throat and intranasal mucosa c/w URI  Lungs:  Normal respiratory effort, chest expands symmetrically. Lungs are clear to auscultation, no crackles or wheezes. Heart:  Normal rate and regular rhythm. S1 and S2 normal without gallop, murmur, click, rub or other extra sounds. Abdomen:  Bowel sounds positive,abdomen soft and non-tender without masses, organomegaly or hernias noted.   Impression & Recommendations:  Problem # 1:  BRONCHITIS, ACUTE (ICD-466.0) Assessment New  Her updated medication list for this problem includes:    Proair Hfa 108 (90 Base) Mcg/act Aers (Albuterol sulfate) .[name]... 2 inh qid as needed    Tussicaps 10-8 Mg Xr12h-cap (Hydrocod polst-chlorphen polst) .[name]... 1 by mouth two times a day as needed cough    Tessalon Perles 100 Mg Caps (Benzonatate) .[name]... 1-2 by mouth two times a day as needed cogh    Zithromax Z-pak 250 Mg Tabs (Azithromycin) .[name]... As dirrected    Symbicort 160-4.5 Mcg/act Aero (Budesonide-formoterol fumarate) .[name]... 2 inh two times a day  Problem # 2:  DIABETES MELLITUS, TYPE II (ICD-250.00) Assessment: Improved  Her updated medication list for this problem includes:    Hyzaar 100-25 Mg Tabs (Losartan potassium-hctz) .[name]... 1 by mouth qd    Onglyza 5 Mg Tabs (Saxagliptin hcl) .[name]... 1 by  mouth once daily for diabetes    Adult Aspirin Ec Low Strength 81 Mg Tbec (Aspirin) .[name]... 1po once daily  Problem # 3:  HYPERTENSION (ICD-401.9) Assessment: Unchanged  Her updated medication list for this problem includes:    Hyzaar 100-25 Mg Tabs (Losartan potassium-hctz) .[name]... 1 by mouth qd  BP today: 148/72 Prior BP: 144/76 (06/09/2010)  Labs Reviewed: K+: 4.0 (06/09/2010) Creat: : 0.7 (06/09/2010)     Problem # 4:  ASTHMA, WITH ACUTE EXACERBATION (ICD-493.92) Assessment: Comment Only  Her updated medication list for this problem  includes:    Proair Hfa 108 (90 Base) Mcg/act Aers (Albuterol sulfate) .[name]... 2 inh qid as needed    Symbicort 160-4.5 Mcg/act Aero (Budesonide-formoterol fumarate) .[name]... 2 inh two times a day  Complete Medication List: 1)  Ibuprofen 600 Mg Tabs (Ibuprofen) .... Two times a day pc as needed 2)  Veramyst 27.5 Mcg/spray Susp (Fluticasone furoate) 3)  Hyzaar 100-25 Mg Tabs (Losartan potassium-hctz) .[name].. 1 by mouth qd 4)  Proair Hfa 108 (90 Base) Mcg/act Aers (Albuterol sulfate) .... 2 inh qid as needed 5)  Promethazine Hcl 25 Mg Tabs (Promethazine hcl) .[name].. 1-2 by mouth four times a day as needed nausea 6)  Onglyza 5 Mg Tabs (Saxagliptin hcl) .[name].. 1 by mouth once daily for diabetes 7)  Adult Aspirin Ec Low Strength 81 Mg Tbec (Aspirin) .[name].. 1po once daily 8)  Tussicaps 10-8 Mg Xr12h-cap (Hydrocod polst-chlorphen polst) .[name].. 1 by mouth two times a day as needed cough 9)  Tessalon Perles 100 Mg Caps (Benzonatate) .[name].. 1-2 by mouth two times a day as needed cogh 10)  Zithromax Z-pak 250 Mg Tabs (Azithromycin) .... As dirrected 11)  Symbicort 160-4.5 Mcg/act Aero (Budesonide-formoterol fumarate) .... 2 inh two times a day  Patient Instructions: 1)  Symbicort 2 inh two times a day 2)  Call if you are not better in a reasonable amount of time or if worse. Go to ER if feeling really bad!  Prescriptions: SYMBICORT 160-4.5 MCG/ACT AERO (BUDESONIDE-FORMOTEROL FUMARATE) 2 inh two times a day  #1 x 3   Entered and Authorized by:   Tresa Garter MD   Signed by:   Tresa Garter MD on 06/16/2010   Method used:   Print then Give to Patient   RxID:   [id number] ZITHROMAX Z-PAK 250 MG TABS (AZITHROMYCIN) as dirrected  #1 x 0   Entered and Authorized by:   Tresa Garter MD   Signed by:   Tresa Garter MD on 06/16/2010   Method used:   Print then Give to Patient   RxID:   [id number] TESSALON PERLES 100 MG CAPS (BENZONATATE) 1-2 by mouth two times a day as needed cogh  #120  x 1   Entered and Authorized by:   Tresa Garter MD   Signed by:   Tresa Garter MD on 06/16/2010   Method used:   Print then Give to Patient   RxID:   [id number] TUSSICAPS 10-8 MG XR12H-CAP (HYDROCOD POLST-CHLORPHEN POLST) 1 by mouth two times a day as needed cough  #20 x 0   Entered and Authorized by:   Tresa Garter MD   Signed by:   Tresa Garter MD on 06/16/2010   Method used:   Print then Give to Patient   RxID:   [id number]

## 2010-10-24 NOTE — Progress Notes (Signed)
Summary: RF Tussicaps  Phone Note From Pharmacy   Caller: [facility]  [address] [id number] Summary of Call: [facility] Tuss[facility]aps 10mg /8mg  caps.......1 two times a day as needed.Marland KitchenMarland KitchenMarland Kitchen# 20.  ok to fill?? Initial call taken by: Lanier Prude, [facility]),  June 16, 2010 10:37 AM  Follow-up for Phone Call        she was given Rx Follow-up by: Tresa Garter MD,  June 16, 2010 5:47 PM

## 2010-10-26 NOTE — Progress Notes (Signed)
Summary: Rf Tussicaps  Phone Note Refill Request Message from:  Fax from Pharmacy  Refills Requested: Medication #1:  TUSSICAPS 10-8 MG XR12H-CAP 1 by mouth two times a day as needed cough   Dosage confirmed as above?Dosage Confirmed   Supply Requested: 20   Last Refilled: 08/15/2010 [facility]   Method Requested: Electronic Next Appointment Scheduled: none Initial call taken by: Lanier Prude, [facility]),  September 13, 2010 8:51 AM  Follow-up for Phone Call        ok #20 Thank you!  Follow-up by: Tresa Garter MD,  September 14, 2010 1:04 PM    Prescriptions: TUSSICAPS 10-8 MG XR12H-CAP (HYDROCOD POLST-CHLORPHEN POLST) 1 by mouth two times a day as needed cough  #20 x 0   Entered by:   Lanier Prude, CMA(AAMA)   Authorized by:   Tresa Garter MD   Signed by:   Lanier Prude, CMA(AAMA) on 09/14/2010   Method used:   Telephoned to ...       [facility]  [address] [id number] ([facility]ail[facility]       [address]       [place], [place]  [address]       Ph: [phone] or [phone]       Fax: [phone]   RxID:   [id number]  Error: called in to [facility] at [phone]

## 2010-11-01 NOTE — Progress Notes (Signed)
Summary: REFERRAL   Phone Note Call from Patient Call back at Home Phone [phone]   Summary of Call: Patient is requesting referral to see a surgeon for the knot on her chest.  Initial call taken by: Lamar Sprinkles, CMA,  October 19, 2010 4:19 PM  Follow-up for Phone Call        I'm note sure what that is - needs OV Follow-up by: Tresa Garter MD,  October 21, 2010 9:54 PM  Additional Follow-up for Phone Call Additional follow up Details #1::        Left detailed vm on hm # to schedule apt Additional Follow-up by: Lamar Sprinkles, CMA,  October 23, 2010 9:03 AM

## 2010-11-03 ENCOUNTER — Encounter: Payer: Self-pay | Admitting: Internal Medicine

## 2010-11-03 ENCOUNTER — Ambulatory Visit (INDEPENDENT_AMBULATORY_CARE_PROVIDER_SITE_OTHER): Payer: MEDICARE | Admitting: Internal Medicine

## 2010-11-03 DIAGNOSIS — R071 Chest pain on breathing: Secondary | ICD-10-CM | POA: Insufficient documentation

## 2010-11-03 DIAGNOSIS — D179 Benign lipomatous neoplasm, unspecified: Secondary | ICD-10-CM

## 2010-11-03 DIAGNOSIS — I10 Essential (primary) hypertension: Secondary | ICD-10-CM

## 2010-11-03 DIAGNOSIS — E119 Type 2 diabetes mellitus without complications: Secondary | ICD-10-CM

## 2010-11-09 NOTE — Assessment & Plan Note (Signed)
Summary: pt states she needs appt before going to surgeon,?surg clearance   Vital Signs:  Patient profile:   70 year old female Height:      67 inches Weight:      214 pounds BMI:     33.64 Temp:     97.9 degrees F oral Pulse rate:   88 / minute Pulse rhythm:   regular Resp:     16 per minute BP sitting:   150 / 82  (left arm) Cuff size:   large  Vitals Entered By: Lanier Prude, Beverly Gust) (November 03, 2010 4:42 PM) CC: f/u c/o 1 episode last Wednesday with burning in face, pain in left ear and tingling in her hand after walking up steps Is Patient Diabetic? Yes   Primary Care Provider:  Dartha Rozzell  CC:  f/u c/o 1 episode last Wednesday with burning in face and pain in left ear and tingling in her hand after walking up steps.  History of Present Illness: C/o a long-time swelling on L chest - stinging at times C/o L big toe toenail coming off C/o HA and B hands tingling  after treadmill, BP was 190/90 once C/o glucose being at 150-200 level  Current Medications (verified): 1)  Ibuprofen 600 Mg  Tabs (Ibuprofen) .... Two Times A Day Pc As Needed 2)  Veramyst 27.5 Mcg/spray Susp (Fluticasone Furoate) 3)  Hyzaar 100-25 Mg Tabs (Losartan Potassium-Hctz) .[name].. 1 By Mouth Qd 4)  Proair Hfa 108 (90 Base) Mcg/act Aers (Albuterol Sulfate) .... 2 Inh Qid As Needed 5)  Promethazine Hcl 25 Mg Tabs (Promethazine Hcl) .[name].. 1-2 By Mouth Four Times A Day As Needed Nausea 6)  Onglyza 5 Mg Tabs (Saxagliptin Hcl) .[name].. 1 By Mouth Once Daily For Diabetes 7)  Adult Aspirin Ec Low Strength 81 Mg Tbec (Aspirin) .[name].. 1po Once Daily 8)  Tussicaps 10-8 Mg Xr12h-Cap (Hydrocod Polst-Chlorphen Polst) .[name].. 1 By Mouth Two Times A Day As Needed Cough 9)  Tessalon Perles 100 Mg Caps (Benzonatate) .[name].. 1-2 By Mouth Two Times A Day As Needed Cogh 10)  Symbicort 160-4.5 Mcg/act Aero (Budesonide-Formoterol Fumarate) .... 2 Inh Two Times A Day  Allergies (verified): 1)  ! Codeine Sulfate (Codeine Sulfate) 2)  !  Aspirin (Aspirin) 3)  ! Darvocet 4)  Metformin Hcl (Metformin Hcl) 5)  Glimepiride (Glimepiride)  Past History:  Past Medical History: Last updated: 09/15/2008 Allergic rhinitis Diabetes mellitus, type II Hypertension Osteoarthritis knees Hyperlipidemia lipoma anterior upper chest  Past Surgical History: Last updated: 09/15/2008 Hysterectomy  ( complete)-85 Oophorectomy - 1985 Appendectomy 1963 cyst off the back 1970's  Family History: Last updated: 09/15/2008 father with brain cancer mother died age 43 yo with DM, MI 5 sibs - several with DM, HTN neice - brain aneurysm brother with CAD  Social History: Last updated: 05/18/2009 work - [employer] widowed Never Smoked Alcohol use-no Drug use-no Regular exercise-no  Review of Systems  The patient denies anorexia, fever, chest pain, syncope, dyspnea on exertion, abdominal pain, difficulty walking, and depression.    Physical Exam  General:  NAD overweight-appearing.   Eyes:  No corneal or conjunctival inflammation noted. EOMI. Perrla. Funduscopic exam benign, without hemorrhages, exudates or papilledema. Vision grossly normal. Nose:  WNL Mouth:  WNL Chest Wall:  6 cm ellastic round NT tumor subcutaneous - centrally located on upper mid-chest Lungs:  Normal respiratory effort, chest expands symmetrically. Lungs are clear to auscultation, no crackles or wheezes. Heart:  Normal rate and regular rhythm. S1 and  S2 normal without gallop, murmur, click, rub or other extra sounds. Abdomen:  Bowel sounds positive,abdomen soft and non-tender without masses, organomegaly or hernias noted. Msk:  No deformity or scoliosis noted of thoracic or lumbar spine.  L great toenail deformed from injury Extremities:  trace ankle edema Neurologic:  alert & oriented X3.   Skin:  lipoma on centrl chest Psych:  Cognition and judgment appear intact. Alert and cooperative with normal attention span and concentration. No apparent  delusions, illusions, hallucinations   Impression & Recommendations:  Problem # 1:  HYPERTENSION (ICD-401.9) Assessment Deteriorated  Her updated medication list for this problem includes:    Hyzaar 100-25 Mg Tabs (Losartan potassium-hctz) .[name]... 1 by mouth qd    Amlodipine Besylate 5 Mg Tabs (Amlodipine besylate) .[name]... 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 1 by mouth qd    Onglyza 5 Mg Tabs (Saxagliptin hcl) .[name]... 1 by mouth once daily for diabetes    Adult Aspirin Ec Low Strength 81 Mg Tbec (Aspirin) .[name]... 1po once daily    Kombiglyze Xr 5-500 Mg Xr24h-tab (Saxagliptin-metformin) .[name]... 1 by mouth once daily TO TRY  Complete Medication List: 1)  Ibuprofen 600 Mg Tabs (Ibuprofen) .... Two times a day pc as needed 2)  Veramyst 27.5 Mcg/spray Susp (Fluticasone furoate) 3)  Hyzaar 100-25 Mg Tabs (Losartan potassium-hctz) .[name].. 1 by mouth qd 4)  Proair Hfa 108 (90 Base) Mcg/act Aers (Albuterol sulfate) .... 2 inh qid as needed 5)  Promethazine Hcl 25 Mg Tabs (Promethazine hcl) .[name].. 1-2 by mouth four times a day as needed nausea 6)  Onglyza 5 Mg Tabs (Saxagliptin hcl) .[name].. 1 by mouth once daily for diabetes 7)  Adult Aspirin Ec Low Strength 81 Mg Tbec (Aspirin) .[name].. 1po once daily 8)  Tussicaps 10-8 Mg Xr12h-cap (Hydrocod polst-chlorphen polst) .[name].. 1 by mouth two times a day as needed cough 9)  Tessalon Perles 100 Mg Caps (Benzonatate) .[name].. 1-2 by mouth two times a day as needed cogh 10)   Symbicort 160-4.5 Mcg/act Aero (Budesonide-formoterol fumarate) .... 2 inh two times a day 11)  Amlodipine Besylate 5 Mg Tabs (Amlodipine besylate) .[name].. 1 by mouth qd 12)  Kombiglyze Xr 5-500 Mg Xr24h-tab (Saxagliptin-metformin) .[name].. 1 by mouth qd  Patient Instructions: 1)  Please schedule a follow-up appointment in 3 months. 2)  BMP prior to visit, ICD-9: 3)  Hepatic Panel prior to visit, ICD-9: 4)  HbgA1C prior to visit, ICD-9:250.02 Prescriptions: KOMBIGLYZE XR 5-500 MG XR24H-TAB (SAXAGLIPTIN-METFORMIN) 1 by mouth qd  #30 x 6   Entered and Authorized by:   Tresa Garter MD   Signed by:   Tresa Garter MD on 11/03/2010   Method used:   Print then Give to Patient   RxID:   [id number] AMLODIPINE BESYLATE 5 MG TABS (AMLODIPINE BESYLATE) 1 by mouth qd  #30 x 12   Entered and Authorized by:   Tresa Garter MD   Signed by:   Tresa Garter MD on 11/03/2010   Method used:   Print then  Give to Patient   RxID:   [id number]    Orders Added: 1)  Surgical Referral [Surgery] 2)  Podiatry Referral [Podiatry] 3)  Est. Patient Level IV [84696]

## 2010-11-30 ENCOUNTER — Encounter: Payer: Self-pay | Admitting: Internal Medicine

## 2010-12-12 NOTE — Letter (Signed)
Summary: [facility]  [facility]   Imported By: Sherian Rein 12/05/2010 07:05:21  _____________________________________________________________________  External Attachment:    Type:   Image     Comment:   External Document

## 2011-01-04 ENCOUNTER — Other Ambulatory Visit (HOSPITAL_COMMUNITY): Payer: Self-pay | Admitting: General Surgery

## 2011-01-04 ENCOUNTER — Encounter (HOSPITAL_COMMUNITY)
Admission: RE | Admit: 2011-01-04 | Discharge: 2011-01-04 | Disposition: A | Discharge status: Home / Self Care | Payer: Medicare Other | Source: Ambulatory Visit | Attending: General Surgery | Admitting: General Surgery

## 2011-01-04 ENCOUNTER — Ambulatory Visit (HOSPITAL_COMMUNITY)
Admission: RE | Admit: 2011-01-04 | Discharge: 2011-01-04 | Disposition: A | Discharge status: Home / Self Care | Payer: Medicare Other | Source: Ambulatory Visit | Attending: General Surgery | Admitting: General Surgery

## 2011-01-04 DIAGNOSIS — Z01811 Encounter for preprocedural respiratory examination: Secondary | ICD-10-CM | POA: Insufficient documentation

## 2011-01-04 DIAGNOSIS — D179 Benign lipomatous neoplasm, unspecified: Secondary | ICD-10-CM | POA: Insufficient documentation

## 2011-01-04 DIAGNOSIS — Z01818 Encounter for other preprocedural examination: Secondary | ICD-10-CM | POA: Insufficient documentation

## 2011-01-04 DIAGNOSIS — Z01812 Encounter for preprocedural laboratory examination: Secondary | ICD-10-CM | POA: Insufficient documentation

## 2011-01-04 LAB — DIFFERENTIAL
Basophils Absolute: 0 10*3/uL (ref 0.0–0.1)
Eosinophils Relative: 2 % (ref 0–5)
Lymphocytes Relative: 47 % — ABNORMAL HIGH (ref 12–46)

## 2011-01-04 LAB — BASIC METABOLIC PANEL
BUN: 12 mg/dL (ref 6–23)
CO2: 27 mEq/L (ref 19–32)
Calcium: 9.6 mg/dL (ref 8.4–10.5)
GFR calc non Af Amer: >60 mL/min (ref >60)
Glucose, Bld: 187 mg/dL — ABNORMAL HIGH (ref 70–99)
Sodium: 140 mEq/L (ref 135–145)

## 2011-01-04 LAB — CBC
HCT: 38.9 % (ref 36.0–46.0)
Hemoglobin: 13 g/dL (ref 12.0–15.0)
MCH: 28.1 pg (ref 26.0–34.0)
MCHC: 33.4 g/dL (ref 30.0–36.0)
MCV: 84.2 fL (ref 78.0–100.0)
RDW: 13.8 % (ref 11.5–15.5)

## 2011-01-11 ENCOUNTER — Other Ambulatory Visit: Payer: Self-pay | Admitting: General Surgery

## 2011-01-11 ENCOUNTER — Ambulatory Visit (HOSPITAL_COMMUNITY)
Admission: RE | Admit: 2011-01-11 | Discharge: 2011-01-11 | Disposition: A | Discharge status: Home / Self Care | Payer: Medicare Other | Source: Ambulatory Visit | Attending: General Surgery | Admitting: General Surgery

## 2011-01-11 DIAGNOSIS — Z01810 Encounter for preprocedural cardiovascular examination: Secondary | ICD-10-CM | POA: Insufficient documentation

## 2011-01-11 DIAGNOSIS — Z01818 Encounter for other preprocedural examination: Secondary | ICD-10-CM | POA: Insufficient documentation

## 2011-01-11 DIAGNOSIS — I10 Essential (primary) hypertension: Secondary | ICD-10-CM | POA: Insufficient documentation

## 2011-01-11 DIAGNOSIS — Z01812 Encounter for preprocedural laboratory examination: Secondary | ICD-10-CM | POA: Insufficient documentation

## 2011-01-11 DIAGNOSIS — E119 Type 2 diabetes mellitus without complications: Secondary | ICD-10-CM | POA: Insufficient documentation

## 2011-01-11 DIAGNOSIS — D1739 Benign lipomatous neoplasm of skin and subcutaneous tissue of other sites: Secondary | ICD-10-CM | POA: Insufficient documentation

## 2011-01-11 LAB — GLUCOSE, CAPILLARY
Glucose-Capillary: 222 mg/dL — ABNORMAL HIGH (ref 70–99)
Glucose-Capillary: 175 mg/dL — ABNORMAL HIGH (ref 70–99)
Glucose-Capillary: 229 mg/dL — ABNORMAL HIGH (ref 70–99)

## 2011-01-29 NOTE — Op Note (Signed)
  NAMESHARVI, MOONEYHAN           ACCOUNT NO.:  [id number]  MEDICAL RECORD NO.:  [id number]          PATIENT TYPE:  LOCATION:                                 FACILITY:  PHYSICIAN:  Lennie Muckle, MD      DATE OF BIRTH:  04-24-1941  DATE OF PROCEDURE: DATE OF DISCHARGE:                              OPERATIVE REPORT   PREOPERATIVE DIAGNOSIS:  Lipoma, chest wall.  POSTOPERATIVE DIAGNOSIS:  Lipoma, chest wall.  PROCEDURE:  Excisional lipoma, anterior chest wall.  ANESTHESIA:  General endotracheal anesthesia.  ESTIMATED BLOOD LOSS:  Minimal.  SPECIMEN:  Lipoma of chest wall, size was 8 x 7.  COMPLICATIONS:  No immediate complications.  BLOOD LOSS:  Minimal blood loss.  INDICATION FOR PROCEDURE:  Ms. Hollings is a 70 year old female who had a lipoma on her chest wall.  It slowly had grown over size.  She had some difficulty swallowing liquids, but no weight loss.  She desired to have removal due to the growth in the size.  Informed consent was obtained.  DETAILS OF PROCEDURE:  Ms. Sinning was identified in the preoperative holding area, seen by Anesthesia, and taken to the operating room.  No preoperative antibiotics were indicated.  She was placed in supine position and placed in general endotracheal anesthesia.  Left arm tucked.  Anterior chest was prepped and draped in the usual sterile fashion.  A surgical time-out was performed.  I began by placing an incision directly over the lipoma.  I divided the subcutaneous tissues with electrocautery.  I was able to remove the lipoma intact using electrocautery.  The specimen was oriented with short superior, long lateral.  The specimen was passed off the operative field.  Estimated size 8 x 11 cm.  I irrigated the wound bed.  A small amount of oozing. Controlled this with electrocautery.  Irrigated the wound bed.  I attempted to reapproximate the subcutaneous fat to close down the space using a 3-0 Vicryl suture.  A 20 mL  of 0.25% Marcaine were injected for local anesthetic.  Dermis was closed with 0 Vicryl and skin was closed with 4-0 Monocryl.  Steri-Strips, dry gauze, and Tegaderm was placed for final dressing.  The patient was extubated and transferred post to the Anesthesia Care Unit in stable condition. She will be discharged home with Vicodin.  Follow up with me in 2-3 weeks' time.     Lennie Muckle, MD     ALA/MEDQ  D:  01/11/2011  T:  01/11/2011  Job:  [id number]  cc:   Georgina Quint. Plotnikov, MD  Electronically Signed by Bertram Savin MD on 01/29/2011 10:41:51 AM

## 2011-02-09 NOTE — Discharge Summary (Signed)
   NAME:  Rachel [name], Rachel [name]                     ACCOUNT NO.:  [id number]   MEDICAL RECORD NO.:  [id number]                   PATIENT TYPE:  OBV   LOCATION:  3731                                 FACILITY:  [facility]   PHYSICIAN:  Rene Paci, M.D. [facility]          DATE OF BIRTH:  May 29, 1941   DATE OF ADMISSION:  12/08/2002  DATE OF DISCHARGE:  12/09/2002                                 DISCHARGE SUMMARY   DISCHARGE DIAGNOSES:  1. Atypical chest pain.  2. Probable bronchospasm, bronchitis.   HISTORY:  [name] is a 70 year old African American female who  presented with onset of chest pain.  This was nonexertional.  It was  associated with chest tightness, shortness of breath and diaphoresis.  Cardiac risk factors are negative for hypertension, diabetes, tobacco abuse  or hypercholesterolemia however, her mother was status post CABG at age 17  and deceased at 50 with congestive heart failure.   PAST MEDICAL HISTORY:  1. Status post appendectomy.  2. Status post total abdominal hysterectomy.   HOSPITAL COURSE:  #1.  ATYPICAL CHEST PAIN.  The patient was admitted to  rule out myocardial infarction.  Serial cardiac enzymes were negative.  We  did do an adenosine Cardiolite that was also negative.  There was no  indication for further cardiac work up at this time.  #2.  LIPIDS.  Her cholesterol level was not known.  A fasting lipid profile  was drawn but still pending at the time of this dictation.  #3.  HYPERTENSION.  The patient was hypertensive on admission.  She was not  on any antihypertensives prior to admission and she has not received any  while being here.  More than likely her hypertension was a result of  anxiety.  Her blood pressure has normalized.  #4.  ID.  It sounds like the patient was recently treated with doxycycline  for a sinusitis.  She does state that she has had some productive cough and  chest tightness secondary to her recent infection.  Her chest tightness  may  therefore be a component of bronchospasm or bronchitis.  She has received a  breathing treatment while here with some improvement and we will discharge  her home to use her albuterol and MDI.   LABORATORY DATA:  Laboratory at discharge:  lipid profile is pending.   DISCHARGE MEDICATIONS:  1. Ecotrin 325 mg daily.  2. Tussionex p.r.n.  3. Albuterol MDI two puffs every four to six hours p.r.n.   FOLLOW UP:  Follow up with Dr. Posey Rea in one to two weeks.    Cornell Barman, P.A. [facility]                  Rene Paci, M.D. [facility]     LC/MEDQ  D:  12/09/2002  T:  12/10/2002  Job:  [id number]   cc:   Georgina Quint. Plotnikov, M.D. [facility]

## 2011-02-14 ENCOUNTER — Other Ambulatory Visit: Payer: Self-pay | Admitting: *Deleted

## 2011-02-14 MED ORDER — ALBUTEROL SULFATE HFA 108 (90 BASE) MCG/ACT IN AERS: 2.0000 | INHALATION_SPRAY | Freq: Four times a day (QID) | RESPIRATORY_TRACT | Status: DC

## 2011-02-15 ENCOUNTER — Encounter: Payer: Self-pay | Admitting: Internal Medicine

## 2011-02-15 ENCOUNTER — Telehealth: Payer: Self-pay

## 2011-02-15 ENCOUNTER — Ambulatory Visit (INDEPENDENT_AMBULATORY_CARE_PROVIDER_SITE_OTHER): Payer: Medicare Other | Admitting: Internal Medicine

## 2011-02-15 VITALS — BP 138/70 | HR 69 | Temp 97.9°F | Ht 63.0 in | Wt 206.0 lb

## 2011-02-15 DIAGNOSIS — E119 Type 2 diabetes mellitus without complications: Secondary | ICD-10-CM

## 2011-02-15 DIAGNOSIS — I10 Essential (primary) hypertension: Secondary | ICD-10-CM

## 2011-02-15 DIAGNOSIS — J029 Acute pharyngitis, unspecified: Secondary | ICD-10-CM

## 2011-02-15 MED ORDER — AZITHROMYCIN 250 MG PO TABS: ORAL_TABLET | ORAL | Status: AC

## 2011-02-15 MED ORDER — LOSARTAN POTASSIUM-HCTZ 100-25 MG PO TABS: 1.0000 | ORAL_TABLET | Freq: Every day | ORAL | Status: DC

## 2011-02-15 MED ORDER — AMLODIPINE BESYLATE 5 MG PO TABS: 5.0000 mg | ORAL_TABLET | Freq: Every day | ORAL | Status: DC

## 2011-02-15 NOTE — Assessment & Plan Note (Addendum)
Mild elev in light of her DM, and we realize today pt not taking the amlodipine due to some confusion - ok to take both BP meds, f/u with Dr plotnikov  BP Readings from Last 3 Encounters:  02/15/11 138/70  11/03/10 150/82  06/16/10 148/72

## 2011-02-15 NOTE — Patient Instructions (Addendum)
Take all new medications as prescribed Continue all other medications as before, including both blood pressure medicines

## 2011-02-15 NOTE — Telephone Encounter (Signed)
abtract

## 2011-02-19 ENCOUNTER — Encounter: Payer: Self-pay | Admitting: Internal Medicine

## 2011-02-19 NOTE — Assessment & Plan Note (Addendum)
stable overall by hx and exam, most recent lab reviewed with pt, and pt to continue medical treatment as before, has been trying to do better with diet, needs f/u labs and exam with PCP -  Lab Results  Component Value Date   HGBA1C 10.0* 06/09/2010

## 2011-02-19 NOTE — Assessment & Plan Note (Signed)
Mild to mod, for antibx course,  to f/u any worsening symptoms or concerns 

## 2011-02-19 NOTE — Progress Notes (Signed)
  Subjective:    Patient ID: Rachel [name], female    DOB: July 07, 1941, 70 y.o.   MRN: [id number]  HPI  Here with acute onset mild to mod 2-3 days severe ST, HA, general weakness and malaise, with nonprod cough, but Pt denies chest pain, increased sob or doe, wheezing, orthopnea, PND, increased LE swelling, palpitations, dizziness or syncope. Pt denies new neurological symptoms such as new headache, or facial or extremity weakness or numbness   Pt denies polydipsia, polyuria, or low sugar symptoms such as weakness or confusion improved with po intake.  Pt states overall good compliance with meds, trying to follow lower cholesterol, diabetic diet, wt overall stable but little exercise however.      Pt denies fever, wt loss, night sweats, loss of appetite, or other constitutional symptoms except with current symptoms.   Past Medical History  Diagnosis Date  . Allergic rhinitis   . Diabetes mellitus, type 2   . Hypertension   . Osteoarthritis of knee   . Hyperlipemia   . Lipoma NEC     anterior upper chest   Past Surgical History  Procedure Date  . Vaginal hysterectomy 1985    complete  . Oophorectomy 1985  . Appendectomy 1963  . Acne cyst removal 1970    back    reports that she has never smoked. She does not have any smokeless tobacco history on file. She reports that she does not drink alcohol or use illicit drugs. family history includes Aneurysm in her other; Cancer in her father; Coronary artery disease in her brother; Diabetes in her brother, mother, and sister; Heart attack in her mother; and Hypertension in her brother and sister. Allergies  Allergen Reactions  . Aspirin   . Codeine Sulfate   . Glimepiride     REACTION: diarrhea  . Metformin     REACTION: diarrhea  . Propoxyphene N-Acetaminophen    Current Outpatient Prescriptions on File Prior to Visit  Medication Sig Dispense Refill  . albuterol (PROAIR HFA) 108 (90 BASE) MCG/ACT inhaler Inhale 2 puffs into the lungs  4 (four) times daily.  25.5 g  5   Review of Systems All otherwise neg per pt     Objective:   Physical Exam BP 138/70  Pulse 69  Temp(Src) 97.9 F (36.6 C) (Oral)  Ht 5\' 3"  (1.6 m)  Wt 206 lb (93.441 kg)  BMI 36.49 kg/m2  SpO2 96% Physical Exam  VS noted, mild ill  Constitutional: Pt appears well-developed and well-nourished.  HENT: Head: Normocephalic.  Right Ear: External ear normal.  Left Ear: External ear normal.  Bilat tm's mild erythema.  Sinus nontender.  Pharynx severe erythema with exudate Eyes: Conjunctivae and EOM are normal. Pupils are equal, round, and reactive to light.  Neck: Normal range of motion. Neck supple.  Cardiovascular: Normal rate and regular rhythm.   Pulmonary/Chest: Effort normal and breath sounds normal.  Neurological: Pt is alert. No cranial nerve deficit.  Skin: Skin is warm. No erythema.  Psychiatric: Pt behavior is normal. Thought content normal.         Assessment & Plan:

## 2011-03-14 ENCOUNTER — Other Ambulatory Visit: Payer: Self-pay | Admitting: Internal Medicine

## 2011-03-14 ENCOUNTER — Ambulatory Visit (INDEPENDENT_AMBULATORY_CARE_PROVIDER_SITE_OTHER): Payer: Medicare Other

## 2011-03-14 ENCOUNTER — Other Ambulatory Visit: Payer: Medicare Other

## 2011-03-14 ENCOUNTER — Other Ambulatory Visit (INDEPENDENT_AMBULATORY_CARE_PROVIDER_SITE_OTHER): Payer: Medicare Other

## 2011-03-14 DIAGNOSIS — Z0000 Encounter for general adult medical examination without abnormal findings: Secondary | ICD-10-CM

## 2011-03-14 DIAGNOSIS — I10 Essential (primary) hypertension: Secondary | ICD-10-CM

## 2011-03-14 DIAGNOSIS — E119 Type 2 diabetes mellitus without complications: Secondary | ICD-10-CM

## 2011-03-14 DIAGNOSIS — Z23 Encounter for immunization: Secondary | ICD-10-CM

## 2011-03-14 LAB — URINALYSIS
Ketones, ur: NEGATIVE
Specific Gravity, Urine: >=1.03 (ref 1.000–1.030)
Total Protein, Urine: NEGATIVE
Urine Glucose: NEGATIVE

## 2011-03-14 LAB — CBC WITH DIFFERENTIAL/PLATELET
Basophils Absolute: 0.1 10*3/uL (ref 0.0–0.1)
Eosinophils Absolute: 0.1 10*3/uL (ref 0.0–0.7)
Eosinophils Relative: 1.7 % (ref 0.0–5.0)
HCT: 36.3 % (ref 36.0–46.0)
Lymphs Abs: 3.2 10*3/uL (ref 0.7–4.0)
MCHC: 33.9 g/dL (ref 30.0–36.0)
MCV: 85.7 fl (ref 78.0–100.0)
Monocytes Absolute: 0.4 10*3/uL (ref 0.1–1.0)
Platelets: 312 10*3/uL (ref 150.0–400.0)
RDW: 14.1 % (ref 11.5–14.6)

## 2011-03-14 LAB — HEPATIC FUNCTION PANEL
ALT: 19 U/L (ref 0–35)
AST: 19 U/L (ref 0–37)
Alkaline Phosphatase: 85 U/L (ref 39–117)
Bilirubin, Direct: 0.1 mg/dL (ref 0.0–0.3)
Total Bilirubin: 0.7 mg/dL (ref 0.3–1.2)
Total Protein: 7.5 g/dL (ref 6.0–8.3)

## 2011-03-14 LAB — BASIC METABOLIC PANEL
BUN: 17 mg/dL (ref 6–23)
Calcium: 9.6 mg/dL (ref 8.4–10.5)
GFR: 105.43 mL/min (ref >60.00)
Glucose, Bld: 225 mg/dL — ABNORMAL HIGH (ref 70–99)
Potassium: 4.6 mEq/L (ref 3.5–5.1)
Sodium: 136 mEq/L (ref 135–145)

## 2011-03-14 LAB — LIPID PANEL
Cholesterol: 223 mg/dL — ABNORMAL HIGH (ref 0–200)
HDL: 46.1 mg/dL (ref >39.00)

## 2011-03-16 ENCOUNTER — Encounter: Payer: Self-pay | Admitting: *Deleted

## 2011-03-16 LAB — TB SKIN TEST: TB Skin Test: NEGATIVE mm

## 2011-03-21 ENCOUNTER — Ambulatory Visit (INDEPENDENT_AMBULATORY_CARE_PROVIDER_SITE_OTHER): Payer: Medicare Other | Admitting: Internal Medicine

## 2011-03-21 ENCOUNTER — Encounter: Payer: Self-pay | Admitting: Internal Medicine

## 2011-03-21 DIAGNOSIS — I10 Essential (primary) hypertension: Secondary | ICD-10-CM

## 2011-03-21 DIAGNOSIS — J45901 Unspecified asthma with (acute) exacerbation: Secondary | ICD-10-CM

## 2011-03-21 DIAGNOSIS — Z0000 Encounter for general adult medical examination without abnormal findings: Secondary | ICD-10-CM

## 2011-03-21 DIAGNOSIS — D179 Benign lipomatous neoplasm, unspecified: Secondary | ICD-10-CM

## 2011-03-21 DIAGNOSIS — E119 Type 2 diabetes mellitus without complications: Secondary | ICD-10-CM

## 2011-03-21 DIAGNOSIS — E786 Lipoprotein deficiency: Secondary | ICD-10-CM

## 2011-03-21 DIAGNOSIS — J309 Allergic rhinitis, unspecified: Secondary | ICD-10-CM

## 2011-03-21 MED ORDER — BUDESONIDE-FORMOTEROL FUMARATE 160-4.5 MCG/ACT IN AERO: 2.0000 | INHALATION_SPRAY | Freq: Two times a day (BID) | RESPIRATORY_TRACT | Status: DC

## 2011-03-21 MED ORDER — GLIMEPIRIDE 1 MG PO TABS: 1.0000 mg | ORAL_TABLET | ORAL | Status: DC

## 2011-03-21 NOTE — Assessment & Plan Note (Signed)
Cont Rx 

## 2011-03-21 NOTE — Assessment & Plan Note (Addendum)
Worse lately Ventolin MDI sample - using rare Tussicaps prn Symbicort

## 2011-03-21 NOTE — Assessment & Plan Note (Addendum)
Worse. Start Amaryl See Meds

## 2011-03-21 NOTE — Assessment & Plan Note (Signed)
The patient is here for annual Medicare wellness examination and management of other chronic and acute problems.   The risk factors are reflected in the social history.  The roster of all physicians providing medical care to patient - is listed in the Snapshot section of the chart.  Activities of daily living:  The patient is 100% inedpendent in all ADLs: dressing, toileting, feeding as well as independent mobility  Home safety : The patient has smoke detectors in the home. They wear seatbelts.No firearms at home ( firearms are present in the home, kept in a safe fashion). There is no violence in the home.   There is no risks for hepatitis, STDs or HIV. There is no   history of blood transfusion. They have no travel history to infectious disease endemic areas of the world.  The patient has (has not) seen their dentist in the last six month. They have (not) seen their eye doctor in the last year. They deny (admit to) any hearing difficulty and have not had audiologic testing in the last year.  They do not  have excessive sun exposure. Discussed the need for sun protection: hats, long sleeves and use of sunscreen if there is significant sun exposure.   Diet: the importance of a healthy diet is discussed. They do have a healthy (unhealthy-high fat/fast food) diet.  The patient has a regular exercise program: __3_____ , _h___duration, _1____per week.  The benefits of regular aerobic exercise were discussed.  Depression screen: there are no signs or vegative symptoms of depression- irritability, change in appetite, anhedonia, sadness/tearfullness.  Cognitive assessment: the patient manages all their financial and personal affairs and is actively engaged. They could relate day,date,year and events; recalled 3/3 objects at 3 minutes; performed clock-face test normally.  The following portions of the patient's history were reviewed and updated as appropriate: allergies, current medications, past family  history, past medical history,  past surgical history, past social history  and problem list.  Vision, hearing, body mass index were assessed and reviewed.   During the course of the visit the patient was educated and counseled about appropriate screening and preventive services including : fall prevention , diabetes screening, nutrition counseling, colorectal cancer screening, and recommended immunizations.

## 2011-03-21 NOTE — Progress Notes (Signed)
Subjective:    Patient ID: Rachel [name], female    DOB: [birth year], 70 y.o.   MRN: [id number]  HPI  The patient is here for a wellness exam. The patient has been doing well overall without major physical or psychological issues going on lately. The patient needs to address  chronic hypertension that has been well controlled with medicines; to address chronic  hyperlipidemia controlled with medicines as well; and to address type 2 chronic diabetes, not controlled too well with medical treatment and diet. Asthma is worse some  Wt Readings from Last 3 Encounters:  03/21/11 211 lb (95.709 kg)  02/15/11 206 lb (93.441 kg)  11/03/10 214 lb (97.07 kg)    Review of Systems  Constitutional: Negative.  Negative for fever, chills, diaphoresis, activity change, appetite change, fatigue and unexpected weight change.  HENT: Negative for hearing loss, ear pain, nosebleeds, congestion, sore throat, facial swelling, rhinorrhea, sneezing, mouth sores, trouble swallowing, neck pain, neck stiffness, postnasal drip, sinus pressure and tinnitus.   Eyes: Negative for pain, discharge, redness, itching and visual disturbance.  Respiratory: Negative for cough, chest tightness, shortness of breath, wheezing and stridor.   Cardiovascular: Negative for chest pain, palpitations and leg swelling.  Gastrointestinal: Negative for nausea, diarrhea, constipation, blood in stool, abdominal distention, anal bleeding and rectal pain.  Genitourinary: Negative for dysuria, urgency, frequency, hematuria, flank pain, vaginal bleeding, vaginal discharge, difficulty urinating, genital sores and pelvic pain.  Musculoskeletal: Negative for back pain, joint swelling, arthralgias and gait problem.  Skin: Negative.  Negative for pallor and rash.  Neurological: Negative for dizziness, tremors, seizures, syncope, speech difficulty, weakness, numbness and headaches.  Hematological: Negative for adenopathy. Does not bruise/bleed  easily.  Psychiatric/Behavioral: Negative for suicidal ideas, behavioral problems, sleep disturbance, dysphoric mood and decreased concentration. The patient is not nervous/anxious.        Objective:   Physical Exam  Constitutional: She appears well-developed. No distress.       Obese  HENT:  Head: Normocephalic.  Right Ear: External ear normal.  Left Ear: External ear normal.  Nose: Nose normal.  Mouth/Throat: Oropharynx is clear and moist.  Eyes: Conjunctivae are normal. Pupils are equal, round, and reactive to light. Right eye exhibits no discharge. Left eye exhibits no discharge.  Neck: Normal range of motion. Neck supple. No JVD present. No tracheal deviation present. No thyromegaly present.  Cardiovascular: Normal rate, regular rhythm and normal heart sounds.   Pulmonary/Chest: No stridor. No respiratory distress. She has no wheezes.  Abdominal: Soft. Bowel sounds are normal. She exhibits no distension and no mass. There is no tenderness. There is no rebound and no guarding.  Musculoskeletal: She exhibits no edema and no tenderness.       Chest lipoma is gone  Lymphadenopathy:    She has no cervical adenopathy.  Neurological: She displays normal reflexes. No cranial nerve deficit. She exhibits normal muscle tone. Coordination normal.  Skin: No rash noted. No erythema.  Psychiatric: She has a normal mood and affect. Her behavior is normal. Judgment and thought content normal.       Lab Results  Component Value Date   WBC 8.2 03/14/2011   HGB 12.3 03/14/2011   HCT 36.3 03/14/2011   PLT 312.0 03/14/2011   CHOL 223* 03/14/2011   TRIG 185.0* 03/14/2011   HDL 46.10 03/14/2011   LDLDIRECT 150.2 03/14/2011   ALT 19 03/14/2011   AST 19 03/14/2011   NA 136 03/14/2011   K 4.6 03/14/2011   CL 101  03/14/2011   CREATININE 0.7 03/14/2011   BUN 17 03/14/2011   CO2 27 03/14/2011   TSH 2.21 03/14/2011   HGBA1C 10.0* 06/09/2010      Assessment & Plan:

## 2011-03-21 NOTE — Assessment & Plan Note (Signed)
Doing Well

## 2011-03-21 NOTE — Assessment & Plan Note (Signed)
On Rx 

## 2011-05-31 ENCOUNTER — Other Ambulatory Visit: Payer: Self-pay | Admitting: *Deleted

## 2011-05-31 MED ORDER — IBUPROFEN 600 MG PO TABS: 600.0000 mg | ORAL_TABLET | Freq: Two times a day (BID) | ORAL | Status: DC | PRN

## 2011-06-25 ENCOUNTER — Encounter: Payer: Self-pay | Admitting: *Deleted

## 2011-06-25 ENCOUNTER — Other Ambulatory Visit (INDEPENDENT_AMBULATORY_CARE_PROVIDER_SITE_OTHER): Payer: Medicare Other

## 2011-06-25 ENCOUNTER — Telehealth: Payer: Self-pay | Admitting: Internal Medicine

## 2011-06-25 ENCOUNTER — Encounter: Payer: Self-pay | Admitting: Internal Medicine

## 2011-06-25 ENCOUNTER — Ambulatory Visit (INDEPENDENT_AMBULATORY_CARE_PROVIDER_SITE_OTHER): Payer: Medicare Other | Admitting: Internal Medicine

## 2011-06-25 DIAGNOSIS — I10 Essential (primary) hypertension: Secondary | ICD-10-CM

## 2011-06-25 DIAGNOSIS — M79609 Pain in unspecified limb: Secondary | ICD-10-CM

## 2011-06-25 DIAGNOSIS — M79672 Pain in left foot: Secondary | ICD-10-CM

## 2011-06-25 DIAGNOSIS — H6690 Otitis media, unspecified, unspecified ear: Secondary | ICD-10-CM

## 2011-06-25 DIAGNOSIS — M7989 Other specified soft tissue disorders: Secondary | ICD-10-CM

## 2011-06-25 DIAGNOSIS — E119 Type 2 diabetes mellitus without complications: Secondary | ICD-10-CM

## 2011-06-25 DIAGNOSIS — M6740 Ganglion, unspecified site: Secondary | ICD-10-CM

## 2011-06-25 LAB — COMPREHENSIVE METABOLIC PANEL
ALT: 19 U/L (ref 0–35)
AST: 18 U/L (ref 0–37)
Alkaline Phosphatase: 84 U/L (ref 39–117)
CO2: 27 mEq/L (ref 19–32)
Creatinine, Ser: 0.5 mg/dL (ref 0.4–1.2)
Sodium: 138 mEq/L (ref 135–145)
Total Bilirubin: 0.7 mg/dL (ref 0.3–1.2)
Total Protein: 8.4 g/dL — ABNORMAL HIGH (ref 6.0–8.3)

## 2011-06-25 LAB — HEMOGLOBIN A1C: Hgb A1c MFr Bld: 11.4 % — ABNORMAL HIGH (ref 4.6–6.5)

## 2011-06-25 MED ORDER — AMOXICILLIN 500 MG PO CAPS: 1000.0000 mg | ORAL_CAPSULE | Freq: Two times a day (BID) | ORAL | Status: AC

## 2011-06-25 MED ORDER — SAXAGLIPTIN HCL 5 MG PO TABS: 5.0000 mg | ORAL_TABLET | Freq: Every day | ORAL | Status: DC

## 2011-06-25 MED ORDER — LOSARTAN POTASSIUM-HCTZ 100-25 MG PO TABS: 1.0000 | ORAL_TABLET | Freq: Every day | ORAL | Status: DC

## 2011-06-25 MED ORDER — AMLODIPINE BESYLATE 5 MG PO TABS: 5.0000 mg | ORAL_TABLET | Freq: Every day | ORAL | Status: DC

## 2011-06-25 MED ORDER — GLIMEPIRIDE 1 MG PO TABS: 1.0000 mg | ORAL_TABLET | Freq: Two times a day (BID) | ORAL | Status: DC

## 2011-06-25 NOTE — Progress Notes (Signed)
  Subjective:    Patient ID: Rachel [name], female    DOB: 1941-06-15, 70 y.o.   MRN: [id number]  HPI   The patient presents for a follow-up of  chronic hypertension, chronic dyslipidemia, type 2 diabetes controlled with medicines C/o R earache C/o fall at work (slid and fell on L leg, twiste ankle) 2 mo ago and now is having pain in L foot, has swelling   Review of Systems  Constitutional: Negative for chills, activity change, appetite change, fatigue and unexpected weight change.  HENT: Negative for congestion, mouth sores and sinus pressure.   Eyes: Negative for visual disturbance.  Respiratory: Negative for cough and chest tightness.   Gastrointestinal: Negative for nausea and abdominal pain.  Genitourinary: Negative for frequency, difficulty urinating and vaginal pain.  Musculoskeletal: Positive for arthralgias (L ankle). Negative for back pain and gait problem.  Skin: Negative for pallor and rash.  Neurological: Negative for dizziness, tremors, weakness, numbness and headaches.  Psychiatric/Behavioral: Negative for confusion and sleep disturbance.       Objective:   Physical Exam  Constitutional: She appears well-developed and well-nourished. No distress.  HENT:  Head: Normocephalic.  Right Ear: External ear normal.  Left Ear: External ear normal.  Nose: Nose normal.  Mouth/Throat: Oropharynx is clear and moist.  Eyes: Conjunctivae are normal. Pupils are equal, round, and reactive to light. Right eye exhibits no discharge. Left eye exhibits no discharge.  Neck: Normal range of motion. Neck supple. No JVD present. No tracheal deviation present. No thyromegaly present.  Cardiovascular: Normal rate, regular rhythm and normal heart sounds.   Pulmonary/Chest: No stridor. No respiratory distress. She has no wheezes.  Abdominal: Soft. Bowel sounds are normal. She exhibits no distension and no mass. There is no tenderness. There is no rebound and no guarding.    Musculoskeletal: She exhibits tenderness (L foot lat). She exhibits no edema.       L foot ganglion cyst  Lymphadenopathy:    She has no cervical adenopathy.  Neurological: She displays normal reflexes. No cranial nerve deficit. She exhibits normal muscle tone. Coordination normal.  Skin: No rash noted. No erythema.  Psychiatric: She has a normal mood and affect. Her behavior is normal. Judgment and thought content normal.    Procedure Note :    Procedure :   Point of care (POC) sonography examination   Indication: L lat foot mass   Equipment used: Sonosite M-Turbo with HFL38x/13-6 MHz transducer linear probe. The images were stored in the unit and later transferred in storage.  The patient was placed in a decubitus position.  This study revealed a hypoechoic  lesion in the lat aspect of L midfoot c/w ganglion cyst    Impression: L lat foot ganglion cyst         Assessment & Plan:

## 2011-06-25 NOTE — Assessment & Plan Note (Signed)
Risks associated with treatment noncompliance were discussed. Compliance was encouraged. 

## 2011-06-25 NOTE — Assessment & Plan Note (Signed)
Risks associated with treatment noncompliance were discussed. Compliance was encouraged. BP Readings from Last 3 Encounters:  06/25/11 150/76  03/21/11 130/80  02/15/11 138/70

## 2011-06-25 NOTE — Assessment & Plan Note (Signed)
Will sch procedure

## 2011-06-25 NOTE — Telephone Encounter (Signed)
Stacey, please, inform patient that her sugar has been high - start Glimepiride 1 mg bid Thx

## 2011-06-25 NOTE — Assessment & Plan Note (Signed)
Will aspirate 

## 2011-06-25 NOTE — Assessment & Plan Note (Signed)
Amoxicillin po 

## 2011-06-26 NOTE — Telephone Encounter (Signed)
Left mess for patient to call back.  

## 2011-06-26 NOTE — Telephone Encounter (Signed)
Patient informed. 

## 2011-08-31 ENCOUNTER — Encounter: Payer: Self-pay | Admitting: Internal Medicine

## 2011-08-31 ENCOUNTER — Ambulatory Visit: Payer: Medicare Other | Admitting: Internal Medicine

## 2011-08-31 ENCOUNTER — Ambulatory Visit (INDEPENDENT_AMBULATORY_CARE_PROVIDER_SITE_OTHER): Payer: Medicare Other | Admitting: Internal Medicine

## 2011-08-31 VITALS — BP 162/72 | HR 79 | Temp 98.5°F

## 2011-08-31 DIAGNOSIS — J45909 Unspecified asthma, uncomplicated: Secondary | ICD-10-CM

## 2011-08-31 DIAGNOSIS — E119 Type 2 diabetes mellitus without complications: Secondary | ICD-10-CM

## 2011-08-31 DIAGNOSIS — J45901 Unspecified asthma with (acute) exacerbation: Secondary | ICD-10-CM

## 2011-08-31 MED ORDER — HYDROCOD POLST-CPM POLST ER 10-8 MG PO CP12: 1.0000 | ORAL_CAPSULE | Freq: Two times a day (BID) | ORAL | Status: DC | PRN

## 2011-08-31 MED ORDER — METHYLPREDNISOLONE ACETATE 80 MG/ML IJ SUSP
120.0000 mg | Freq: Once | INTRAMUSCULAR | Status: AC
  Administered 2011-08-31: 120 mg via INTRAMUSCULAR

## 2011-08-31 MED ORDER — AZITHROMYCIN 250 MG PO TABS: ORAL_TABLET | ORAL | Status: DC

## 2011-08-31 NOTE — Progress Notes (Signed)
  Subjective:    Patient ID: Rachel [name], female    DOB: 12-Jun-1941, 70 y.o.   MRN: [id number]  HPI Complains of cough symptoms Onset 48 hours ago Precipitated by sick contacts her grandchild Associated with fatigue, wheeze, fever and minimally productive sputum Denies chest pain, leg swelling or headache Using asthma rescue inhaler as needed without relief No improvement using over-the-counter cough medications  Past Medical History  Diagnosis Date  . Allergic rhinitis   . Diabetes mellitus, type 2   . Hypertension   . Osteoarthritis of knee   . Hyperlipemia   . Lipoma NEC     anterior upper chest     Review of Systems Respiratory: No hemoptysis, no pleurisy Cardiovascular: No palpitations, chest pain or edema    Objective:   Physical Exam BP 162/72  Pulse 79  Temp(Src) 98.5 F (36.9 C) (Oral)  SpO2 99% Wt Readings from Last 3 Encounters:  06/25/11 211 lb (95.709 kg)  03/21/11 211 lb (95.709 kg)  02/15/11 206 lb (93.441 kg)   Constitutional: Overweight, coughing but no acute distress.  HENT: Head: Normocephalic and atraumatic. Ears: B TMs ok, no erythema or effusion; Nose: Nose  Rhinorrhea with clear discharge. Mouth/Throat: Oropharynx is mildly red but clear and moist. No oropharyngeal exudate.  Eyes: Conjunctivae and EOM are normal. Pupils are equal, round, and reactive to light. No scleral icterus.  Neck: Normal range of motion. Neck supple. Mild cervical lymphadenopathy No JVD present. No thyromegaly present.  Cardiovascular: Normal rate, regular rhythm and normal heart sounds.  No murmur heard. No BLE edema. Pulmonary/Chest: Effort normal - breath sounds with scattered rhonchi and expiratory wheezing bilaterally        Assessment & Plan:  Acute asthmatic bronchitis -  Empiric Z-Pak antibiotic coverage Steroid shot given today Antitussives tablets Continue Symbicort and rescue albuterol inhaler as needed

## 2011-08-31 NOTE — Patient Instructions (Signed)
It was good to see you today. Steroid shot given for bronchitis symptoms today Z-Pak antibiotics and prescription cough capsules as needed - Your prescription(s) have been submitted to your pharmacy. Please take as directed and contact our office if you believe you are having problem(s) with the medication(s). Rest, stay hydrated and use her usual medications as ongoing. Watch your sugars and call if over 200 in next few days - steroids will increase your sugars more than usual for next few days!

## 2011-08-31 NOTE — Assessment & Plan Note (Signed)
patient advised on anticipated increase in cbgs due to steroid effect for next several days - pt will call if cbg>200

## 2011-09-26 ENCOUNTER — Ambulatory Visit: Payer: Medicare Other | Admitting: Internal Medicine

## 2011-10-31 ENCOUNTER — Ambulatory Visit (INDEPENDENT_AMBULATORY_CARE_PROVIDER_SITE_OTHER): Payer: Medicare Other | Admitting: Internal Medicine

## 2011-10-31 ENCOUNTER — Encounter: Payer: Self-pay | Admitting: Internal Medicine

## 2011-10-31 VITALS — BP 146/62 | HR 78 | Temp 97.5°F | Wt 218.0 lb

## 2011-10-31 DIAGNOSIS — J069 Acute upper respiratory infection, unspecified: Secondary | ICD-10-CM

## 2011-10-31 DIAGNOSIS — E119 Type 2 diabetes mellitus without complications: Secondary | ICD-10-CM

## 2011-10-31 DIAGNOSIS — J0100 Acute maxillary sinusitis, unspecified: Secondary | ICD-10-CM

## 2011-10-31 MED ORDER — FLUTICASONE PROPIONATE 50 MCG/ACT NA SUSP: 2.0000 | Freq: Every day | NASAL | Status: DC

## 2011-10-31 MED ORDER — HYDROCOD POLST-CPM POLST ER 10-8 MG PO CP12: 1.0000 | ORAL_CAPSULE | Freq: Two times a day (BID) | ORAL | Status: DC | PRN

## 2011-10-31 MED ORDER — AZITHROMYCIN 250 MG PO TABS: ORAL_TABLET | ORAL | Status: AC

## 2011-10-31 NOTE — Assessment & Plan Note (Signed)
Lab Results  Component Value Date   HGBA1C 11.4* 06/25/2011   Reminded pt to arrange follow up with PCP on same

## 2011-10-31 NOTE — Progress Notes (Signed)
  Subjective:    Patient ID: Rachel [name], female    DOB: Jan 23, 1941, 71 y.o.   MRN: [id number]  HPI Complains of R face and ear pain - ?sinus or ear infection Onset 5 days ago - progressively worse despite OTC meds Precipitated by sick contacts Associated with cough, LGF and minimally productive sputum Denies chest pain, leg swelling or headache Using asthma rescue inhaler as needed with relief of daytime cough symptoms  No improvement using over-the-counter cough medications  Past Medical History  Diagnosis Date  . Allergic rhinitis   . Diabetes mellitus, type 2   . Hypertension   . Osteoarthritis of knee   . Hyperlipemia   . Lipoma NEC     anterior upper chest     Review of Systems Respiratory: No hemoptysis, no pleurisy Cardiovascular: No palpitations, chest pain or edema    Objective:   Physical Exam BP 146/62  Pulse 78  Temp(Src) 97.5 F (36.4 C) (Oral)  Wt 218 lb (98.884 kg)  SpO2 97% Wt Readings from Last 3 Encounters:  10/31/11 218 lb (98.884 kg)  06/25/11 211 lb (95.709 kg)  03/21/11 211 lb (95.709 kg)   Constitutional: Overweight, coughing but no acute distress.  HENT: Head: Normocephalic and atraumatic. Sinus mildly tender over R maxillary region. Ears: B TMs ok, no erythema or effusion; Nose: Nose  Rhinorrhea with clear discharge. Mouth/Throat: Oropharynx is mildly red but clear and moist. No oropharyngeal exudate.  Eyes: Conjunctivae and EOM are normal. Pupils are equal, round, and reactive to light. No scleral icterus.  Neck: Normal range of motion. Neck supple. Mild cervical lymphadenopathy No JVD present. No thyromegaly present.  Cardiovascular: Normal rate, regular rhythm and normal heart sounds.  No murmur heard. No BLE edema. Pulmonary/Chest: Effort normal - breath sounds without  rhonchi or wheezing       Assessment & Plan:  Acute maxillary sinusitis   Empiric Z-Pak antibiotic coverage Antitussives tablets Add nasal steroid

## 2011-10-31 NOTE — Patient Instructions (Signed)
It was good to see you today Z-Pak antibiotics, nose spray for sinus and allergy symptoms + prescription cough capsules as needed - Your prescription(s) have been submitted to your pharmacy. Please take as directed and contact our office if you believe you are having problem(s) with the medication(s). Rest, stay hydrated and use your usual asthma medications as ongoing.

## 2012-02-26 ENCOUNTER — Other Ambulatory Visit: Payer: Self-pay

## 2012-02-26 ENCOUNTER — Emergency Department (HOSPITAL_COMMUNITY)
Admission: EM | Admit: 2012-02-26 | Discharge: 2012-02-26 | Disposition: A | Discharge status: Home / Self Care | Payer: Medicare Other | Attending: Emergency Medicine | Admitting: Emergency Medicine

## 2012-02-26 ENCOUNTER — Encounter (HOSPITAL_COMMUNITY): Payer: Self-pay | Admitting: *Deleted

## 2012-02-26 ENCOUNTER — Telehealth: Payer: Self-pay | Admitting: Internal Medicine

## 2012-02-26 ENCOUNTER — Emergency Department (HOSPITAL_COMMUNITY): Payer: Medicare Other

## 2012-02-26 DIAGNOSIS — Z79899 Other long term (current) drug therapy: Secondary | ICD-10-CM | POA: Insufficient documentation

## 2012-02-26 DIAGNOSIS — E785 Hyperlipidemia, unspecified: Secondary | ICD-10-CM | POA: Insufficient documentation

## 2012-02-26 DIAGNOSIS — M436 Torticollis: Secondary | ICD-10-CM | POA: Insufficient documentation

## 2012-02-26 DIAGNOSIS — E119 Type 2 diabetes mellitus without complications: Secondary | ICD-10-CM | POA: Insufficient documentation

## 2012-02-26 DIAGNOSIS — Z7982 Long term (current) use of aspirin: Secondary | ICD-10-CM | POA: Insufficient documentation

## 2012-02-26 DIAGNOSIS — I10 Essential (primary) hypertension: Secondary | ICD-10-CM | POA: Insufficient documentation

## 2012-02-26 DIAGNOSIS — M1710 Unilateral primary osteoarthritis, unspecified knee: Secondary | ICD-10-CM | POA: Insufficient documentation

## 2012-02-26 MED ORDER — CYCLOBENZAPRINE HCL 10 MG PO TABS: 10.0000 mg | ORAL_TABLET | Freq: Two times a day (BID) | ORAL | Status: AC | PRN

## 2012-02-26 MED ORDER — TRAMADOL HCL 50 MG PO TABS: 50.0000 mg | ORAL_TABLET | Freq: Four times a day (QID) | ORAL | Status: AC | PRN

## 2012-02-26 NOTE — ED Notes (Signed)
Pt states pain is intermittent.

## 2012-02-26 NOTE — Discharge Instructions (Signed)
Torticollis, Acute You have suddenly (acutely) developed a twisted neck (torticollis). This is usually a self-limited condition. CAUSES  Acute torticollis may be caused by malposition, trauma or infection. Most commonly, acute torticollis is caused by sleeping in an awkward position. Torticollis may also be caused by the flexion, extension or twisting of the neck muscles beyond their normal position. Sometimes, the exact cause may not be known. SYMPTOMS  Usually, there is pain and limited movement of the neck. Your neck may twist to one side. DIAGNOSIS  The diagnosis is often made by physical examination. X-rays, CT scans or MRIs may be done if there is a history of trauma or concern of infection. TREATMENT  For a common, stiff neck that develops during sleep, treatment is focused on relaxing the contracted neck muscle. Medications (including shots) may be used to treat the problem. Most cases resolve in several days. Torticollis usually responds to conservative physical therapy. If left untreated, the shortened and spastic neck muscle can cause deformities in the face and neck. Rarely, surgery is required. HOME CARE INSTRUCTIONS   Use over-the-counter and prescription medications as directed by your caregiver.   Do stretching exercises and massage the neck as directed by your caregiver.   Follow up with physical therapy if needed and as directed by your caregiver.  SEEK IMMEDIATE MEDICAL CARE IF:   You develop difficulty breathing or noisy breathing (stridor).   You drool, develop trouble swallowing or have pain with swallowing.   You develop numbness or weakness in the hands or feet.   You have changes in speech or vision.   You have problems with urination or bowel movements.   You have difficulty walking.   You have a fever.   You have increased pain.  MAKE SURE YOU:   Understand these instructions.   Will watch your condition.   Will get help right away if you are not  doing well or get worse.  Document Released: 09/07/2000 Document Revised: 08/30/2011 Document Reviewed: 10/19/2009 [employer] Patient Information 2012 [employer]. 

## 2012-02-26 NOTE — Telephone Encounter (Signed)
Caller: Otie/Patient; PCP: Sonda Primes; CB#: [phone];  Call regarding Sharp Coming and Going Pain in Neck (left); Unbearable intermittent sharp L neck pain onset last PM 02/25/12. Pain with palpation and movement. [facility] ED now per Neck Pain Protocol.

## 2012-02-26 NOTE — Telephone Encounter (Signed)
I could see her on Fri after 2 pm Use Ibuprofen prn If needs to be seen sooner - pls w/in w/another MD Thx

## 2012-02-26 NOTE — ED Notes (Signed)
Pt reports pain x 3 days to left side of neck. Pt reports it feels like I slept on it wrong. Denies chest pain sob or nausea. Pain increases with certain movements and palpation. Denies earaches or sore throat.

## 2012-02-26 NOTE — ED Notes (Signed)
Pt has returned from xray
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
## 2012-02-28 NOTE — ED Provider Notes (Signed)
Medical screening examination/treatment/procedure(s) were performed by non-physician practitioner and as supervising physician I was immediately available for consultation/collaboration.   Forbes Cellar, MD 02/28/12 [phone]

## 2012-05-12 ENCOUNTER — Other Ambulatory Visit: Payer: Self-pay | Admitting: Internal Medicine

## 2012-07-08 ENCOUNTER — Other Ambulatory Visit: Payer: Self-pay | Admitting: Internal Medicine

## 2012-07-09 ENCOUNTER — Other Ambulatory Visit: Payer: Self-pay | Admitting: Internal Medicine

## 2012-07-09 ENCOUNTER — Ambulatory Visit: Payer: Medicare Other | Admitting: Internal Medicine

## 2012-07-11 ENCOUNTER — Ambulatory Visit (INDEPENDENT_AMBULATORY_CARE_PROVIDER_SITE_OTHER): Payer: Medicare Other | Admitting: Internal Medicine

## 2012-07-11 ENCOUNTER — Encounter: Payer: Self-pay | Admitting: Internal Medicine

## 2012-07-11 ENCOUNTER — Ambulatory Visit: Payer: Medicare Other | Admitting: Internal Medicine

## 2012-07-11 VITALS — BP 126/72 | HR 72 | Temp 97.6°F | Resp 16 | Wt 209.0 lb

## 2012-07-11 DIAGNOSIS — I10 Essential (primary) hypertension: Secondary | ICD-10-CM

## 2012-07-11 DIAGNOSIS — E119 Type 2 diabetes mellitus without complications: Secondary | ICD-10-CM

## 2012-07-11 DIAGNOSIS — R05 Cough: Secondary | ICD-10-CM

## 2012-07-11 DIAGNOSIS — J069 Acute upper respiratory infection, unspecified: Secondary | ICD-10-CM

## 2012-07-11 DIAGNOSIS — R059 Cough, unspecified: Secondary | ICD-10-CM

## 2012-07-11 MED ORDER — AZITHROMYCIN 250 MG PO TABS: ORAL_TABLET | ORAL | Status: DC

## 2012-07-11 MED ORDER — HYDROCOD POLST-CPM POLST ER 5-4 MG PO CP12: ORAL_CAPSULE | ORAL | Status: DC

## 2012-07-11 NOTE — Patient Instructions (Addendum)
Take all new medications as prescribed Continue all other medications as before Please remember to sign up for My Chart at your earliest convenience, as this will be important to you in the future with finding out test results.  

## 2012-07-12 ENCOUNTER — Encounter: Payer: Self-pay | Admitting: Internal Medicine

## 2012-07-12 NOTE — Assessment & Plan Note (Signed)
Mild to mod, for antibx course,  to f/u any worsening symptoms or concerns 

## 2012-07-12 NOTE — Assessment & Plan Note (Signed)
Also for cough med prn,  to f/u any worsening symptoms or concerns

## 2012-07-12 NOTE — Assessment & Plan Note (Signed)
stable overall by hx and exam, most recent data reviewed with pt, and pt to continue medical treatment as before, but has been uncontrolled as of last labs, need further f/u with PCP Lab Results  Component Value Date   WBC 8.2 03/14/2011   HGB 12.3 03/14/2011   HCT 36.3 03/14/2011   PLT 312.0 03/14/2011   GLUCOSE 159* 06/25/2011   CHOL 223* 03/14/2011   TRIG 185.0* 03/14/2011   HDL 46.10 03/14/2011   LDLDIRECT 150.2 03/14/2011   ALT 19 06/25/2011   AST 18 06/25/2011   NA 138 06/25/2011   K 3.8 06/25/2011   CL 101 06/25/2011   CREATININE 0.5 06/25/2011   BUN 13 06/25/2011   CO2 27 06/25/2011   TSH 2.21 03/14/2011   HGBA1C 11.4* 06/25/2011

## 2012-07-12 NOTE — Assessment & Plan Note (Signed)
stable overall by hx and exam, most recent data reviewed with pt, and pt to continue medical treatment as before BP Readings from Last 3 Encounters:  07/11/12 126/72  02/26/12 152/54  10/31/11 146/62

## 2012-07-12 NOTE — Progress Notes (Signed)
Subjective:    Patient ID: Rachel [name], female    DOB: [birth year], 71 y.o.   MRN: [id number]  HPI   Here with 3 days acute onset fever, facial pain, pressure, general weakness and malaise, and greenish d/c, with mild ST and cough so that she cant sleep at night,  and Pt denies chest pain, increased sob or doe, wheezing, orthopnea, PND, increased LE swelling, palpitations, dizziness or syncope.  Pt denies new neurological symptoms such as new headache, or facial or extremity weakness or numbness   Pt denies polydipsia, polyuria.  Pt denies recent wt loss, night sweats, loss of appetite, or other constitutional symptoms Past Medical History  Diagnosis Date  . Allergic rhinitis   . Diabetes mellitus, type 2   . Hypertension   . Osteoarthritis of knee   . Hyperlipemia   . Lipoma NEC     anterior upper chest   Past Surgical History  Procedure Date  . Vaginal hysterectomy 1985    complete  . Oophorectomy 1985  . Appendectomy 1963  . Acne cyst removal 1970    back    reports that she has never smoked. She does not have any smokeless tobacco history on file. She reports that she does not drink alcohol or use illicit drugs. family history includes Aneurysm in her other; Cancer in her father; Coronary artery disease in her brother; Diabetes in her brother, mother, and sister; Heart attack in her mother; and Hypertension in her brother and sister. Allergies  Allergen Reactions  . Aspirin Nausea And Vomiting    Uncoated aspirin Jittery GI upset  . Codeine Sulfate Nausea And Vomiting    Heart races.  . Glimepiride Diarrhea  . Metformin Diarrhea  . Propoxyphene-Acetaminophen Nausea And Vomiting   Current Outpatient Prescriptions on File Prior to Visit  Medication Sig Dispense Refill  . aspirin 325 MG EC tablet Take 325 mg by mouth daily.      [name] glimepiride (AMARYL) 1 MG tablet Take 1 mg by mouth 2 (two) times daily.      [name] ibuprofen (ADVIL,MOTRIN) 600 MG tablet Take 600 mg by  mouth 2 (two) times daily as needed. For pain      . ibuprofen (ADVIL,MOTRIN) 600 MG tablet TAKE 1 TABLET TWICE A DAY  60 tablet  0  . losartan-hydrochlorothiazide (HYZAAR) 100-25 MG per tablet Take 1 tablet by mouth daily.      [name] PROAIR HFA 108 (90 BASE) MCG/ACT inhaler INHALE 2 PUFFS INTO THE LUNGS 4 (FOUR) TIMES DAILY.  25 each  0   Review of Systems  Constitutional: Negative for diaphoresis and unexpected weight change.  HENT: Negative for tinnitus.   Eyes: Negative for photophobia and visual disturbance.  Respiratory: Negative for choking and stridor.   Gastrointestinal: Negative for vomiting and blood in stool.  Genitourinary: Negative for hematuria and decreased urine volume.  Musculoskeletal: Negative for gait problem.  Skin: Negative for color change and wound.  Neurological: Negative for tremors and numbness.  Psychiatric/Behavioral: Negative for decreased concentration. The patient is not hyperactive.       Objective:   Physical Exam BP 126/72  Pulse 72  Temp 97.6 F (36.4 C) (Oral)  Resp 16  Wt 209 lb (94.802 kg)  SpO2 97% Physical Exam  VS noted, mild ill Constitutional: Pt appears well-developed and well-nourished.  HENT: Head: Normocephalic.  Right Ear: External ear normal.  Left Ear: External ear normal.  Bilat tm's mild erythema.  Sinus nontender.  Pharynx mild erythema  Eyes: Conjunctivae and EOM are normal. Pupils are equal, round, and reactive to light.  Neck: Normal range of motion. Neck supple.  Cardiovascular: Normal rate and regular rhythm.   Pulmonary/Chest: Effort normal and breath sounds normal.  - no rales or wheezing Neurological: Pt is alert. Not confused  Skin: Skin is warm. No erythema.  Psychiatric: Pt behavior is normal. Thought content normal.     Assessment & Plan:

## 2012-07-14 ENCOUNTER — Ambulatory Visit: Payer: Medicare Other | Admitting: Internal Medicine

## 2012-08-28 ENCOUNTER — Encounter: Payer: Self-pay | Admitting: Internal Medicine

## 2012-08-28 ENCOUNTER — Ambulatory Visit (INDEPENDENT_AMBULATORY_CARE_PROVIDER_SITE_OTHER): Payer: Medicare Other | Admitting: Internal Medicine

## 2012-08-28 VITALS — BP 124/70 | HR 80 | Temp 96.9°F | Resp 16 | Wt 202.0 lb

## 2012-08-28 DIAGNOSIS — J45901 Unspecified asthma with (acute) exacerbation: Secondary | ICD-10-CM

## 2012-08-28 DIAGNOSIS — I10 Essential (primary) hypertension: Secondary | ICD-10-CM

## 2012-08-28 DIAGNOSIS — R05 Cough: Secondary | ICD-10-CM

## 2012-08-28 DIAGNOSIS — R059 Cough, unspecified: Secondary | ICD-10-CM

## 2012-08-28 DIAGNOSIS — J069 Acute upper respiratory infection, unspecified: Secondary | ICD-10-CM

## 2012-08-28 MED ORDER — METHYLPREDNISOLONE ACETATE 80 MG/ML IJ SUSP
120.0000 mg | Freq: Once | INTRAMUSCULAR | Status: AC
  Administered 2012-08-28: 120 mg via INTRAMUSCULAR

## 2012-08-28 MED ORDER — HYDROCODONE-ACETAMINOPHEN 5-325 MG PO TABS: 1.0000 | ORAL_TABLET | Freq: Four times a day (QID) | ORAL | Status: DC | PRN

## 2012-08-28 MED ORDER — ALBUTEROL SULFATE HFA 108 (90 BASE) MCG/ACT IN AERS: 2.0000 | INHALATION_SPRAY | Freq: Four times a day (QID) | RESPIRATORY_TRACT | Status: DC | PRN

## 2012-08-28 MED ORDER — AZITHROMYCIN 250 MG PO TABS: ORAL_TABLET | ORAL | Status: DC

## 2012-08-28 NOTE — Assessment & Plan Note (Signed)
Continue with current prescription therapy as reflected on the Med list.  

## 2012-08-28 NOTE — Assessment & Plan Note (Signed)
Zpac 

## 2012-08-28 NOTE — Assessment & Plan Note (Signed)
Depo 120 mg IM Proair MDI

## 2012-08-28 NOTE — Progress Notes (Signed)
Subjective:    Patient ID: Rachel [name], female    DOB: July 21, 1941, 71 y.o.   MRN: [id number]  Cough The current episode started in the past 7 days. The problem has been waxing and waning. The problem occurs hourly. The cough is productive of sputum. Associated symptoms include a fever and shortness of breath. The treatment provided mild relief. Her past medical history is significant for asthma.  Fever  This is a new problem. The problem has been rapidly improving. Associated symptoms include coughing.     Here with 6 days acute onset fever, facial pain, pressure, general weakness and malaise, and greenish d/c, with mild ST and cough so that she cant sleep at night,  and Pt denies chest pain, increased sob or doe, wheezing, orthopnea, PND, increased LE swelling, palpitations, dizziness or syncope.  Pt denies new neurological symptoms such as new headache, or facial or extremity weakness or numbness   Pt denies polydipsia, polyuria.  Pt denies recent wt loss, night sweats, loss of appetite, or other constitutional symptoms Past Medical History  Diagnosis Date  . Allergic rhinitis   . Diabetes mellitus, type 2   . Hypertension   . Osteoarthritis of knee   . Hyperlipemia   . Lipoma NEC     anterior upper chest   Past Surgical History  Procedure Date  . Vaginal hysterectomy 1985    complete  . Oophorectomy 1985  . Appendectomy 1963  . Acne cyst removal 1970    back    reports that she has never smoked. She does not have any smokeless tobacco history on file. She reports that she does not drink alcohol or use illicit drugs. family history includes Aneurysm in her other; Cancer in her father; Coronary artery disease in her brother; Diabetes in her brother, mother, and sister; Heart attack in her mother; and Hypertension in her brother and sister. Allergies  Allergen Reactions  . Aspirin Nausea And Vomiting    Uncoated aspirin Jittery GI upset  . Codeine Sulfate Nausea And  Vomiting    Heart races.  . Glimepiride Diarrhea  . Metformin Diarrhea  . Propoxyphene-Acetaminophen Nausea And Vomiting   Current Outpatient Prescriptions on File Prior to Visit  Medication Sig Dispense Refill  . aspirin 325 MG EC tablet Take 325 mg by mouth daily.      [name] azithromycin (ZITHROMAX Z-PAK) 250 MG tablet Use as directed  6 each  1  . glimepiride (AMARYL) 1 MG tablet Take 1 mg by mouth 2 (two) times daily.      . Hydrocod Polst-Chlorphen Polst (TUSSICAPS) 5-4 MG CP12 1 tab by mouth twice per day as needed  60 each  0  . ibuprofen (ADVIL,MOTRIN) 600 MG tablet Take 600 mg by mouth 2 (two) times daily as needed. For pain      . ibuprofen (ADVIL,MOTRIN) 600 MG tablet TAKE 1 TABLET TWICE A DAY  60 tablet  0  . losartan-hydrochlorothiazide (HYZAAR) 100-25 MG per tablet Take 1 tablet by mouth daily.      [name] PROAIR HFA 108 (90 BASE) MCG/ACT inhaler INHALE 2 PUFFS INTO THE LUNGS 4 (FOUR) TIMES DAILY.  25 each  0   Review of Systems  Constitutional: Positive for fever.  Respiratory: Positive for cough and shortness of breath.     Constitutional: Negative for diaphoresis and unexpected weight change.  HENT: Negative for tinnitus.   Eyes: Negative for photophobia and visual disturbance.  Respiratory: Negative for choking and stridor.  Gastrointestinal: Negative for vomiting and blood in stool.  Genitourinary: Negative for hematuria and decreased urine volume.  Musculoskeletal: Negative for gait problem.  Skin: Negative for color change and wound.  Neurological: Negative for tremors and numbness.  Psychiatric/Behavioral: Negative for decreased concentration. The patient is not hyperactive.       Objective:   Physical Exam BP 124/70  Pulse 80  Temp 96.9 F (36.1 C) (Oral)  Resp 16  Wt 202 lb (91.627 kg) Physical Exam  VS noted, mild ill Constitutional: Pt appears well-developed and well-nourished.  HENT: Head: Normocephalic.  Right Ear: External ear normal.  Left Ear:  External ear normal.  Bilat tm's mild erythema.  Sinus nontender.  Pharynx mild erythema Eyes: Conjunctivae and EOM are normal. Pupils are equal, round, and reactive to light.  Neck: Normal range of motion. Neck supple.  Cardiovascular: Normal rate and regular rhythm.   Pulmonary/Chest: Effort normal and breath sounds normal.  - no rales or wheezing Neurological: Pt is alert. Not confused  Skin: Skin is warm. No erythema.  Psychiatric: Pt behavior is normal. Thought content normal.     Assessment & Plan:

## 2012-08-28 NOTE — Assessment & Plan Note (Signed)
Vicodin prn (Tussiocaps are too $$$, cn't use syrups)

## 2012-10-23 ENCOUNTER — Other Ambulatory Visit: Payer: Self-pay | Admitting: Internal Medicine

## 2012-12-11 ENCOUNTER — Other Ambulatory Visit: Payer: Self-pay | Admitting: Internal Medicine

## 2013-02-10 ENCOUNTER — Ambulatory Visit (INDEPENDENT_AMBULATORY_CARE_PROVIDER_SITE_OTHER): Payer: Medicare Other | Admitting: Internal Medicine

## 2013-02-10 ENCOUNTER — Encounter: Payer: Self-pay | Admitting: Internal Medicine

## 2013-02-10 VITALS — BP 142/72 | HR 81 | Temp 98.1°F | Ht 63.0 in | Wt 205.0 lb

## 2013-02-10 DIAGNOSIS — J069 Acute upper respiratory infection, unspecified: Secondary | ICD-10-CM

## 2013-02-10 MED ORDER — AZITHROMYCIN 250 MG PO TABS: ORAL_TABLET | ORAL | Status: DC

## 2013-02-10 MED ORDER — METHYLPREDNISOLONE ACETATE 80 MG/ML IJ SUSP
80.0000 mg | Freq: Once | INTRAMUSCULAR | Status: AC
  Administered 2013-02-10: 80 mg via INTRAMUSCULAR

## 2013-02-10 NOTE — Progress Notes (Signed)
HPI  Pt presents to the clinic today with c/o cold symptoms x 6 days. The worst part is the sore throat and dry cough. She does not produce any sputum. She denies fevers. He has tried Robitussin, Mucinex, cough drops and nothing seems to help. She does have a history of allergies and asthma. She does have sick contacts.  Review of Systems      Past Medical History  Diagnosis Date  . Allergic rhinitis   . Diabetes mellitus, type 2   . Hypertension   . Osteoarthritis of knee   . Hyperlipemia   . Lipoma NEC     anterior upper chest    Family History  Problem Relation Age of Onset  . Cancer Father     brain  . Diabetes Mother   . Heart attack Mother   . Diabetes Sister   . Diabetes Brother   . Hypertension Sister   . Hypertension Brother   . Aneurysm Other     niece  . Coronary artery disease Brother     History   Social History  . Marital Status: Married    Spouse Name: N/A    Number of Children: N/A  . Years of Education: N/A   Occupational History  . department mang [employer]   Social History Main Topics  . Smoking status: Never Smoker   . Smokeless tobacco: Not on file  . Alcohol Use: No  . Drug Use: No  . Sexually Active: Yes   Other Topics Concern  . Not on file   Social History Narrative   No regular exercise   Widowed    Allergies  Allergen Reactions  . Aspirin Nausea And Vomiting    Uncoated aspirin Jittery GI upset  . Codeine Sulfate Nausea And Vomiting    Heart races.  . Glimepiride Diarrhea  . Metformin Diarrhea  . Propoxyphene-Acetaminophen Nausea And Vomiting     Constitutional: Positive headache, fatigue. Denies fever or abrupt weight changes.  HEENT:  Positive sore throat. Denies eye redness, eye pain, pressure behind the eyes, facial pain, nasal congestion, ear pain, ringing in the ears, wax buildup, runny nose or bloody nose. Respiratory: Positive cough. Denies difficulty breathing or shortness of breath.   Cardiovascular: Denies chest pain, chest tightness, palpitations or swelling in the hands or feet.   No other specific complaints in a complete review of systems (except as listed in HPI above).  Objective:   BP 142/72  Pulse 81  Temp(Src) 98.1 F (36.7 C) (Oral)  Ht 5\' 3"  (1.6 m)  Wt 205 lb (92.987 kg)  BMI 36.32 kg/m2  SpO2 97% Wt Readings from Last 3 Encounters:  02/10/13 205 lb (92.987 kg)  08/28/12 202 lb (91.627 kg)  07/11/12 209 lb (94.802 kg)     General: Appears her stated age, well developed, well nourished in NAD. HEENT: Head: normal shape and size; Eyes: sclera white, no icterus, conjunctiva pink, PERRLA and EOMs intact; Ears: Tm's gray and intact, normal light reflex; Nose: mucosa pink and moist, septum midline; Throat/Mouth: + PND. Teeth present, mucosa erythematous and moist, no exudate noted, no lesions or ulcerations noted.  Neck: Mild cervical lymphadenopathy. Neck supple, trachea midline. No massses, lumps or thyromegaly present.  Cardiovascular: Normal rate and rhythm. S1,S2 noted.  No murmur, rubs or gallops noted. No JVD or BLE edema. No carotid bruits noted. Pulmonary/Chest: Normal effort and positive vesicular breath sounds. No respiratory distress. No wheezes, rales or ronchi noted.  Assessment & Plan:   Upper Respiratory Infection, new onset:  Get some rest and drink plenty of water Do salt water gargles for the sore throat eRx for Azithromax x 5 days Continue OTC allergy medicine and Delsym cough syrup  RTC as needed or if symptoms persist.

## 2013-02-10 NOTE — Patient Instructions (Signed)

## 2013-02-11 ENCOUNTER — Encounter: Payer: Self-pay | Admitting: Internal Medicine

## 2013-02-18 ENCOUNTER — Ambulatory Visit (INDEPENDENT_AMBULATORY_CARE_PROVIDER_SITE_OTHER): Payer: Medicare Other | Admitting: Internal Medicine

## 2013-02-18 ENCOUNTER — Encounter: Payer: Self-pay | Admitting: Internal Medicine

## 2013-02-18 VITALS — BP 140/80 | HR 80 | Temp 98.1°F | Resp 16 | Wt 202.0 lb

## 2013-02-18 DIAGNOSIS — I10 Essential (primary) hypertension: Secondary | ICD-10-CM

## 2013-02-18 DIAGNOSIS — J069 Acute upper respiratory infection, unspecified: Secondary | ICD-10-CM

## 2013-02-18 DIAGNOSIS — B351 Tinea unguium: Secondary | ICD-10-CM

## 2013-02-18 MED ORDER — PROMETHAZINE-CODEINE 6.25-10 MG/5ML PO SYRP: 5.0000 mL | ORAL_SOLUTION | ORAL | Status: DC | PRN

## 2013-02-18 NOTE — Progress Notes (Signed)
Patient ID: Rachel [name], female   DOB: [birth year], 72 y.o.   MRN: [id number]   Subjective:    Patient ID: Rachel [name], female    DOB: [birth year], 72 y.o.   MRN: [id number]  Cough This is a recurrent problem. The current episode started 1 to 4 weeks ago. The problem has been gradually improving. The problem occurs every few minutes. The cough is non-productive. Associated symptoms include shortness of breath. The treatment provided mild relief. Her past medical history is significant for asthma.     Here with 6 days acute onset fever, facial pain, pressure, general weakness and malaise, and greenish d/c, with mild ST and cough so that she cant sleep at night,  and Pt denies chest pain, increased sob or doe, wheezing, orthopnea, PND, increased LE swelling, palpitations, dizziness or syncope.  Pt denies new neurological symptoms such as new headache, or facial or extremity weakness or numbness   Pt denies polydipsia, polyuria.  Pt denies recent wt loss, night sweats, loss of appetite, or other constitutional symptoms Past Medical History  Diagnosis Date  . Allergic rhinitis   . Diabetes mellitus, type 2   . Hypertension   . Osteoarthritis of knee   . Hyperlipemia   . Lipoma NEC     anterior upper chest   Past Surgical History  Procedure Laterality Date  . Vaginal hysterectomy  1985    complete  . Oophorectomy  1985  . Appendectomy  1963  . Acne cyst removal  1970    back    reports that she has never smoked. She does not have any smokeless tobacco history on file. She reports that she does not drink alcohol or use illicit drugs. family history includes Aneurysm in her other; Cancer in her father; Coronary artery disease in her brother; Diabetes in her brother, mother, and sister; Heart attack in her mother; and Hypertension in her brother and sister. Allergies  Allergen Reactions  . Aspirin Nausea And Vomiting    Uncoated aspirin Jittery GI upset  . Codeine Sulfate  Nausea And Vomiting    Heart races.  . Glimepiride Diarrhea  . Metformin Diarrhea  . Propoxyphene-Acetaminophen Nausea And Vomiting   Current Outpatient Prescriptions on File Prior to Visit  Medication Sig Dispense Refill  . albuterol (PROAIR HFA) 108 (90 BASE) MCG/ACT inhaler Inhale 2 puffs into the lungs every 6 (six) hours as needed for wheezing.  25 each  0  . aspirin 325 MG EC tablet Take 325 mg by mouth daily.      [name] glimepiride (AMARYL) 1 MG tablet Take 1 mg by mouth 2 (two) times daily.      [name] HYDROcodone-acetaminophen (NORCO/VICODIN) 5-325 MG per tablet Take 1 tablet by mouth every 6 (six) hours as needed for pain (cough, pain).  30 tablet  0  . ibuprofen (ADVIL,MOTRIN) 600 MG tablet TAKE 1 TABLET TWICE A DAY  60 tablet  5  . losartan-hydrochlorothiazide (HYZAAR) 100-25 MG per tablet Take 1 tablet by mouth daily.      [name] azithromycin (ZITHROMAX) 250 MG tablet Take 2 tablets today, then 1 tablet daily for 4 days  6 tablet  0   No current facility-administered medications on file prior to visit.   Review of Systems  Respiratory: Positive for shortness of breath.     Constitutional: Negative for diaphoresis and unexpected weight change.  HENT: Negative for tinnitus.   Eyes: Negative for photophobia and visual disturbance.  Respiratory: Negative for choking and stridor.  Gastrointestinal: Negative for vomiting and blood in stool.  Genitourinary: Negative for hematuria and decreased urine volume.  Musculoskeletal: Negative for gait problem.  Skin: Negative for color change and wound.  Neurological: Negative for tremors and numbness.  Psychiatric/Behavioral: Negative for decreased concentration. The patient is not hyperactive.       Objective:   Physical Exam BP 140/80  Pulse 80  Temp(Src) 98.1 F (36.7 C) (Oral)  Resp 16  Wt 202 lb (91.627 kg)  BMI 35.79 kg/m2 Physical Exam  VS noted, mild ill Constitutional: Pt appears well-developed and well-nourished.  HENT: Head:  Normocephalic.  Right Ear: External ear normal.  Left Ear: External ear normal.  Bilat tm's mild erythema.  Sinus nontender.  Pharynx mild erythema Eyes: Conjunctivae and EOM are normal. Pupils are equal, round, and reactive to light.  Neck: Normal range of motion. Neck supple.  Cardiovascular: Normal rate and regular rhythm.   Pulmonary/Chest: Effort normal and breath sounds normal.  - no rales or wheezing Neurological: Pt is alert. Not confused  Skin: Skin is warm. No erythema.  Psychiatric: Pt behavior is normal. Thought content normal.     Assessment & Plan:

## 2013-02-18 NOTE — Assessment & Plan Note (Signed)
Prom-cod CXR if worse

## 2013-02-18 NOTE — Assessment & Plan Note (Signed)
Continue with current prescription therapy as reflected on the Med list.  

## 2013-02-18 NOTE — Assessment & Plan Note (Signed)
Pod cons

## 2013-05-09 ENCOUNTER — Other Ambulatory Visit: Payer: Self-pay | Admitting: Internal Medicine

## 2013-08-27 ENCOUNTER — Other Ambulatory Visit: Payer: Self-pay | Admitting: Internal Medicine

## 2013-08-28 NOTE — Telephone Encounter (Signed)
Phen with codeine cough syrup not usually an ongoing refillable med - please consider OV

## 2013-08-28 NOTE — Telephone Encounter (Signed)
Ok to Rf in PCP absence? 

## 2013-08-28 NOTE — Telephone Encounter (Signed)
Left detailed mess informing pt of below.  

## 2014-01-08 ENCOUNTER — Encounter: Payer: Self-pay | Admitting: Internal Medicine

## 2014-01-08 ENCOUNTER — Other Ambulatory Visit (INDEPENDENT_AMBULATORY_CARE_PROVIDER_SITE_OTHER): Payer: Medicare Other

## 2014-01-08 ENCOUNTER — Ambulatory Visit (INDEPENDENT_AMBULATORY_CARE_PROVIDER_SITE_OTHER): Payer: Medicare Other | Admitting: Internal Medicine

## 2014-01-08 VITALS — BP 120/70 | HR 75 | Temp 97.1°F | Wt 197.4 lb

## 2014-01-08 DIAGNOSIS — E1165 Type 2 diabetes mellitus with hyperglycemia: Principal | ICD-10-CM

## 2014-01-08 DIAGNOSIS — IMO0001 Reserved for inherently not codable concepts without codable children: Secondary | ICD-10-CM

## 2014-01-08 DIAGNOSIS — J309 Allergic rhinitis, unspecified: Secondary | ICD-10-CM

## 2014-01-08 LAB — LIPID PANEL
Cholesterol: 198 mg/dL (ref 0–200)
HDL: 40.2 mg/dL (ref >39.00)
LDL CALC: 120 mg/dL — AB (ref 0–99)
Total CHOL/HDL Ratio: 5
Triglycerides: 191 mg/dL — ABNORMAL HIGH (ref 0.0–149.0)
VLDL: 38.2 mg/dL (ref 0.0–40.0)

## 2014-01-08 LAB — BASIC METABOLIC PANEL
BUN: 17 mg/dL (ref 6–23)
CHLORIDE: 100 meq/L (ref 96–112)
CO2: 26 mEq/L (ref 19–32)
Calcium: 9.4 mg/dL (ref 8.4–10.5)
Creatinine, Ser: 0.7 mg/dL (ref 0.4–1.2)
GFR: 101.26 mL/min (ref >60.00)
GLUCOSE: 276 mg/dL — AB (ref 70–99)
POTASSIUM: 3.8 meq/L (ref 3.5–5.1)
Sodium: 134 mEq/L — ABNORMAL LOW (ref 135–145)

## 2014-01-08 LAB — MICROALBUMIN / CREATININE URINE RATIO
CREATININE, U: 82.5 mg/dL
MICROALB UR: 0.7 mg/dL (ref 0.0–1.9)
Microalb Creat Ratio: 0.8 mg/g (ref 0.0–30.0)

## 2014-01-08 LAB — HEMOGLOBIN A1C: Hgb A1c MFr Bld: 12.6 % — ABNORMAL HIGH (ref 4.6–6.5)

## 2014-01-08 MED ORDER — FLUTICASONE PROPIONATE 50 MCG/ACT NA SUSP: 2.0000 | Freq: Every day | NASAL | Status: DC

## 2014-01-08 MED ORDER — LORATADINE 10 MG PO TABS: 10.0000 mg | ORAL_TABLET | Freq: Every day | ORAL | Status: DC

## 2014-01-08 MED ORDER — PROMETHAZINE-CODEINE 6.25-10 MG/5ML PO SYRP: 5.0000 mL | ORAL_SOLUTION | ORAL | Status: DC | PRN

## 2014-01-08 NOTE — Assessment & Plan Note (Signed)
Chronic, uncontrolled Complicated by noncompliance Check a1c now There have been some probable medication compliance issues here. I have discussed with her the great importance of following the treatment plan exactly as directed in order to achieve a good medical outcome. Lab Results  Component Value Date   HGBA1C 11.4* 06/25/2011

## 2014-01-08 NOTE — Patient Instructions (Signed)
It was good to see you today.  We have reviewed your prior records including labs and tests today  Test(s) ordered today. Your results will be released to [facility] (or called to you) after review, usually within 72hours after test completion. If any changes need to be made, you will be notified at that same time.  Medications reviewed and updated Start Claritin 10 mg once daily and use Flonase nose spray every day for next 30 days Continue albuterol as needed Cough syrup at night as needed Your prescription(s)/refills have been submitted to your pharmacy. Please take as directed and contact our office if you believe you are having problem(s) with the medication(s).  Please schedule followup in 6-8 weeks with Dr Tessa Lerner, call sooner if problems.

## 2014-01-08 NOTE — Progress Notes (Signed)
Pre visit review using our clinic review tool, if applicable. No additional management support is needed unless otherwise documented below in the visit note. 

## 2014-01-08 NOTE — Progress Notes (Signed)
Rachel [name] [id number] 01/08/2014  Chief Complaint  Patient presents with  . Asthma    Subjective  Patient presents with sinus pressure - note hx of sinusitus secondary to seasonal allergies, sinus symptoms ongoing X 3 weeks. Allergies worse in Fall and Spring.  States she has been around sick people at work who are coughing and sneezing. Has also had a headache, R earache, laryngitis,  and dry cough w/ SOB for X 3 days and productive cough w/ chest tightness X 3 weeks. Tried Mucinex for her sxs and  was not effective. Cough syrup that Dr. Arline Asp prescribed relieved chest tightness.     Asthma She complains of cough, shortness of breath, sputum production and wheezing. There is no hemoptysis. Associated symptoms include ear pain, headaches and PND. Pertinent negatives include no chest pain, fever, malaise/fatigue, myalgias or sore throat. Her past medical history is significant for asthma.   Diabetes Mellitus 2: Patient is noncompliant on current medical regimen.  Past Medical History  Diagnosis Date  . Allergic rhinitis   . Diabetes mellitus, type 2   . Hypertension   . Osteoarthritis of knee   . Hyperlipemia   . Lipoma NEC     anterior upper chest    Past Surgical History  Procedure Laterality Date  . Vaginal hysterectomy  1985    complete  . Oophorectomy  1985  . Appendectomy  1963  . Acne cyst removal  1970    back    Family History  Problem Relation Age of Onset  . Cancer Father     brain  . Diabetes Mother   . Heart attack Mother   . Diabetes Sister   . Diabetes Brother   . Hypertension Sister   . Hypertension Brother   . Aneurysm Other     niece  . Coronary artery disease Brother     History  Substance Use Topics  . Smoking status: Never Smoker   . Smokeless tobacco: Not on file  . Alcohol Use: No    Current Outpatient Prescriptions on File Prior to Visit  Medication Sig Dispense Refill  . albuterol (PROAIR HFA) 108 (90 BASE) MCG/ACT  inhaler Inhale 2 puffs into the lungs every 6 (six) hours as needed for wheezing.  25 each  0  . aspirin 325 MG EC tablet Take 325 mg by mouth daily.      [name] glimepiride (AMARYL) 1 MG tablet Take 1 mg by mouth 2 (two) times daily.      [name] ibuprofen (ADVIL,MOTRIN) 600 MG tablet TAKE 1 TABLET TWICE A DAY  60 tablet  5  . losartan-hydrochlorothiazide (HYZAAR) 100-25 MG per tablet TAKE 1 TABLET BY MOUTH DAILY.  30 tablet  5   No current facility-administered medications on file prior to visit.     Allergies: Allergies  Allergen Reactions  . Aspirin Nausea And Vomiting    Uncoated aspirin Jittery GI upset  . Codeine Sulfate Nausea And Vomiting    Heart races.  . Glimepiride Diarrhea  . Metformin Diarrhea  . Propoxyphene N-Acetaminophen Nausea And Vomiting    Review of Systems  Constitutional: Negative for fever, chills, malaise/fatigue and diaphoresis.  HENT: Positive for congestion and ear pain. Negative for ear discharge and sore throat.   Eyes: Positive for discharge.       Eye discharge with coughing spells.  Respiratory: Positive for cough, sputum production, shortness of breath and wheezing. Negative for hemoptysis.        Light green sputum  X 3 weeks.  Cardiovascular: Positive for PND. Negative for chest pain.       Chest tightness.  Gastrointestinal: Negative for nausea, vomiting, abdominal pain and diarrhea.       No bowel changes.   Genitourinary:       No urinary changes.  Musculoskeletal: Negative for myalgias.  Neurological: Positive for headaches. Negative for weakness.       Objective    Filed Vitals:   01/08/14 1419  BP: 120/70  Pulse: 75  Temp: 97.1 F (36.2 C)  TempSrc: Oral  Weight: 197 lb 6.4 oz (89.54 kg)  SpO2: 96%    Physical Exam  Constitutional: She appears well-developed and well-nourished.  In NAD  HENT:  H- Nontender on palpation of sinuses. E- L clear TM w/o erythema or bulging; R- Cloudy TM w/o erythema or bulging. N- Erythematous  nasal turbinates BL T- Cobblestoning appearance in throat w/ slight erythema. No oropharyngeal exudate.    Eyes: Pupils are equal, round, and reactive to light. Right eye exhibits no discharge.  Neck: Neck supple. No thyromegaly present.  Cardiovascular: Normal rate, regular rhythm and normal heart sounds.  Exam reveals no gallop and no friction rub.   No murmur heard. Respiratory: Breath sounds normal. She has no wheezes. She has no rales.  Lymphadenopathy:    She has no cervical adenopathy.    BP Readings from Last 3 Encounters:  01/08/14 120/70  02/18/13 140/80  02/10/13 142/72    Wt Readings from Last 3 Encounters:  01/08/14 197 lb 6.4 oz (89.54 kg)  02/18/13 202 lb (91.627 kg)  02/10/13 205 lb (92.987 kg)    Lab Results  Component Value Date   WBC 8.2 03/14/2011   HGB 12.3 03/14/2011   HCT 36.3 03/14/2011   PLT 312.0 03/14/2011   GLUCOSE 159* 06/25/2011   CHOL 223* 03/14/2011   TRIG 185.0* 03/14/2011   HDL 46.10 03/14/2011   LDLDIRECT 150.2 03/14/2011   ALT 19 06/25/2011   AST 18 06/25/2011   NA 138 06/25/2011   K 3.8 06/25/2011   CL 101 06/25/2011   CREATININE 0.5 06/25/2011   BUN 13 06/25/2011   CO2 27 06/25/2011   TSH 2.21 03/14/2011   HGBA1C 11.4* 06/25/2011    Dg Cervical Spine Complete  02/26/2012   *RADIOLOGY REPORT*  Clinical Data: Intermittent left neck pain, fell 2 months ago  CERVICAL SPINE - COMPLETE 4+ VIEW  Comparison: None  Findings: Prevertebral soft tissues normal thickness. Vertebral body and disc space heights maintained. No acute fracture, subluxation or bone destruction. Bony foramina patent. Few scattered clothing artifacts. Mild scattered facet degenerative changes. C1-C2 alignment normal.  IMPRESSION: No acute cervical spine abnormalities.  Original Report Authenticated By: Burnetta Sabin, M.D.      Assessment and Plan  Allergic sinusitus: Prescribed OTC Flonase for acute flare and Claritin for seasonal allergies.   Diabetes Mellitus Type 2: Patient is  noncompliant w/ current med regimen. Informed her of potential complications due to noncompliance w/ med regimen. Ordered A1C today.   Lab orders: A1C, BMP, Lipid panel, Microalbumin/Creatinine Urine ratio.   Return in about 6 weeks (around 02/19/2014) for recheck diabetes and labs. Berenice Bouton, Student-PA    I have personally reviewed this case with PA student. I also personally examined this patient. I agree with history and findings as documented above. I reviewed, discussed and approve of the assessment and plan as listed above. Rowe Clack, MD

## 2014-01-08 NOTE — Progress Notes (Signed)
Subjective:    Patient ID: Rachel [name], female    DOB: 03/23/1941, 73 y.o.   MRN: [id number]  Asthma She complains of cough, shortness of breath and wheezing. Associated symptoms include postnasal drip and sneezing. Pertinent negatives include no fever. Her past medical history is significant for asthma.    Also reviewed chronic medical issues and interval medical events  Past Medical History  Diagnosis Date  . Allergic rhinitis   . Diabetes mellitus, type 2   . Hypertension   . Osteoarthritis of knee   . Hyperlipemia   . Lipoma NEC     anterior upper chest    Review of Systems  Constitutional: Positive for fatigue. Negative for fever.  HENT: Positive for postnasal drip, sinus pressure and sneezing.   Respiratory: Positive for cough, shortness of breath and wheezing.   Cardiovascular: Negative for palpitations and leg swelling.       Objective:   Physical Exam  BP 120/70  Pulse 75  Temp(Src) 97.1 F (36.2 C) (Oral)  Wt 197 lb 6.4 oz (89.54 kg)  SpO2 96% Wt Readings from Last 3 Encounters:  01/08/14 197 lb 6.4 oz (89.54 kg)  02/18/13 202 lb (91.627 kg)  02/10/13 205 lb (92.987 kg)   Constitutional: She is obese, but appears well-developed and well-nourished. No distress.  HENT: NCAT, TMs hazy without erythema or effusion Neck: Normal range of motion. Neck supple. No JVD present. No thyromegaly present.  Cardiovascular: Normal rate, regular rhythm and normal heart sounds.  No murmur heard. No BLE edema. Pulmonary/Chest: Effort normal at rest and breath sounds with rare soft exp wheeze. No respiratory distress. She has no crackles.  Psychiatric: She has a normal mood and affect. Her behavior is normal. Judgment and thought content normal.   Lab Results  Component Value Date   WBC 8.2 03/14/2011   HGB 12.3 03/14/2011   HCT 36.3 03/14/2011   PLT 312.0 03/14/2011   GLUCOSE 159* 06/25/2011   CHOL 223* 03/14/2011   TRIG 185.0* 03/14/2011   HDL 46.10 03/14/2011   LDLDIRECT 150.2 03/14/2011   ALT 19 06/25/2011   AST 18 06/25/2011   NA 138 06/25/2011   K 3.8 06/25/2011   CL 101 06/25/2011   CREATININE 0.5 06/25/2011   BUN 13 06/25/2011   CO2 27 06/25/2011   TSH 2.21 03/14/2011   HGBA1C 11.4* 06/25/2011    Dg Cervical Spine Complete  02/26/2012   *RADIOLOGY REPORT*  Clinical Data: Intermittent left neck pain, fell 2 months ago  CERVICAL SPINE - COMPLETE 4+ VIEW  Comparison: None  Findings: Prevertebral soft tissues normal thickness. Vertebral body and disc space heights maintained. No acute fracture, subluxation or bone destruction. Bony foramina patent. Few scattered clothing artifacts. Mild scattered facet degenerative changes. C1-C2 alignment normal.  IMPRESSION: No acute cervical spine abnormalities.  Original Report Authenticated By: Burnetta Sabin, M.D.      Assessment & Plan:   Sinusitis, seasonal, recurrent No evidence for infection on exam Advised OTC antihistamine and Flonase for allergy symptoms  symptomatic cough relief - refill on prometh w/ cod today  Problem List Items Addressed This Visit   ALLERGIC RHINITIS - Primary   Type II or unspecified type diabetes mellitus without mention of complication, uncontrolled      Chronic, uncontrolled Complicated by noncompliance Check a1c now There have been some probable medication compliance issues here. I have discussed with her the great importance of following the treatment plan exactly as directed in order to achieve  a good medical outcome. Lab Results  Component Value Date   HGBA1C 11.4* 06/25/2011       Relevant Orders      Hemoglobin A1c      Basic metabolic panel      Lipid panel      Microalbumin / creatinine urine ratio

## 2014-03-29 ENCOUNTER — Telehealth: Payer: Self-pay

## 2014-03-29 DIAGNOSIS — E1165 Type 2 diabetes mellitus with hyperglycemia: Principal | ICD-10-CM

## 2014-03-29 DIAGNOSIS — IMO0001 Reserved for inherently not codable concepts without codable children: Secondary | ICD-10-CM

## 2014-03-29 NOTE — Telephone Encounter (Signed)
Diabetic bundle-Left a message on pt VM TCB and schedule f/u appointment on diabetes and a1c and BMET ordered

## 2014-07-02 ENCOUNTER — Other Ambulatory Visit: Payer: Self-pay | Admitting: Internal Medicine

## 2014-07-03 ENCOUNTER — Ambulatory Visit (INDEPENDENT_AMBULATORY_CARE_PROVIDER_SITE_OTHER): Payer: Medicare Other | Admitting: Family Medicine

## 2014-07-03 ENCOUNTER — Encounter: Payer: Self-pay | Admitting: Family Medicine

## 2014-07-03 VITALS — BP 130/80 | HR 76 | Temp 98.5°F | Resp 17 | Wt 201.2 lb

## 2014-07-03 DIAGNOSIS — J209 Acute bronchitis, unspecified: Secondary | ICD-10-CM

## 2014-07-03 DIAGNOSIS — B370 Candidal stomatitis: Secondary | ICD-10-CM

## 2014-07-03 DIAGNOSIS — J4521 Mild intermittent asthma with (acute) exacerbation: Secondary | ICD-10-CM

## 2014-07-03 MED ORDER — LOSARTAN POTASSIUM-HCTZ 100-25 MG PO TABS: ORAL_TABLET | ORAL | Status: DC

## 2014-07-03 MED ORDER — AZITHROMYCIN 250 MG PO TABS: ORAL_TABLET | ORAL | Status: DC

## 2014-07-03 MED ORDER — IBUPROFEN 600 MG PO TABS: 600.0000 mg | ORAL_TABLET | Freq: Two times a day (BID) | ORAL | Status: DC | PRN

## 2014-07-03 MED ORDER — NYSTATIN 100000 UNIT/ML MT SUSP: 5.0000 mL | Freq: Four times a day (QID) | OROMUCOSAL | Status: DC

## 2014-07-03 MED ORDER — PROMETHAZINE-CODEINE 6.25-10 MG/5ML PO SYRP: 5.0000 mL | ORAL_SOLUTION | ORAL | Status: DC | PRN

## 2014-07-03 MED ORDER — ALBUTEROL SULFATE HFA 108 (90 BASE) MCG/ACT IN AERS: INHALATION_SPRAY | RESPIRATORY_TRACT | Status: DC

## 2014-07-03 NOTE — Assessment & Plan Note (Signed)
Nystatin swish and spit. 

## 2014-07-03 NOTE — Progress Notes (Signed)
   Subjective:    Patient ID: Rachel [name], female    DOB: [birth year], 73 y.o.   MRN: [id number]  Sore Throat  This is a new problem. The current episode started in the past 7 days. Associated symptoms include coughing and ear pain. Pertinent negatives include no shortness of breath. Treatments tried: claritin.  Otalgia  There is pain in the right ear. This is a new problem. The current episode started in the past 7 days. The problem has been gradually worsening. There has been no fever. The pain is moderate. Associated symptoms include coughing and a sore throat.  Cough This is a new problem. The current episode started in the past 7 days. The problem has been gradually worsening. The cough is productive of purulent sputum. Associated symptoms include ear pain, a sore throat and wheezing. Pertinent negatives include no fever or shortness of breath. Associated symptoms comments: No sinus pain. The symptoms are aggravated by lying down (keeping her up at night). Treatments tried: claritin. The treatment provided no relief (out of proair). Her past medical history is significant for asthma.      Review of Systems  Constitutional: Positive for fatigue. Negative for fever.  HENT: Positive for ear pain and sore throat.   Respiratory: Positive for cough and wheezing. Negative for shortness of breath.        Objective:   Physical Exam  Constitutional: Vital signs are normal. She appears well-developed and well-nourished. She is cooperative.  Non-toxic appearance. She does not appear ill. No distress.  HENT:  Head: Normocephalic.  Right Ear: Hearing, tympanic membrane, external ear and ear canal normal. Tympanic membrane is not erythematous, not retracted and not bulging.  Left Ear: Hearing, tympanic membrane, external ear and ear canal normal. Tympanic membrane is not erythematous, not retracted and not bulging.  Nose: Mucosal edema and rhinorrhea present. Right sinus exhibits no  maxillary sinus tenderness and no frontal sinus tenderness. Left sinus exhibits no maxillary sinus tenderness and no frontal sinus tenderness.  Mouth/Throat: Uvula is midline, oropharynx is clear and moist and mucous membranes are normal.  White plaque on tounge  Eyes: Conjunctivae, EOM and lids are normal. Pupils are equal, round, and reactive to light. Lids are everted and swept, no foreign bodies found.  Neck: Trachea normal and normal range of motion. Neck supple. Carotid bruit is not present. No mass and no thyromegaly present.  Cardiovascular: Normal rate, regular rhythm, S1 normal, S2 normal, normal heart sounds, intact distal pulses and normal pulses.  Exam reveals no gallop and no friction rub.   No murmur heard. Pulmonary/Chest: Effort normal and breath sounds normal. Not tachypneic. No respiratory distress. She has no decreased breath sounds. She has no wheezes. She has no rhonchi. She has no rales.  Neurological: She is alert.  Skin: Skin is warm, dry and intact. No rash noted.  Psychiatric: Her speech is normal and behavior is normal. Judgment normal. Her mood appears not anxious. Cognition and memory are normal. She does not exhibit a depressed mood.          Assessment & Plan:

## 2014-07-03 NOTE — Assessment & Plan Note (Signed)
Mild flare no indication for steroids. Use proair prn.

## 2014-07-03 NOTE — Assessment & Plan Note (Signed)
Treat with azithromycin course, rest and fluids

## 2014-07-03 NOTE — Patient Instructions (Signed)
Treat with azithromycin course, rest and fluids  Nystatin swish for oral thrush.  Can use cough suppressant at night and , mucinex DM during the day. Call if sough not improving as expected.

## 2014-07-03 NOTE — Progress Notes (Signed)
Pre visit review using our clinic review tool, if applicable. No additional management support is needed unless otherwise documented below in the visit note. 

## 2014-08-25 ENCOUNTER — Ambulatory Visit (INDEPENDENT_AMBULATORY_CARE_PROVIDER_SITE_OTHER): Payer: Medicare Other | Admitting: Family

## 2014-08-25 ENCOUNTER — Encounter: Payer: Self-pay | Admitting: Family

## 2014-08-25 VITALS — BP 154/82 | HR 76 | Temp 98.3°F | Resp 18 | Ht 63.0 in | Wt 202.8 lb

## 2014-08-25 DIAGNOSIS — J028 Acute pharyngitis due to other specified organisms: Principal | ICD-10-CM

## 2014-08-25 DIAGNOSIS — B9689 Other specified bacterial agents as the cause of diseases classified elsewhere: Secondary | ICD-10-CM | POA: Insufficient documentation

## 2014-08-25 DIAGNOSIS — J029 Acute pharyngitis, unspecified: Secondary | ICD-10-CM

## 2014-08-25 MED ORDER — AZITHROMYCIN 250 MG PO TABS: ORAL_TABLET | ORAL | Status: DC

## 2014-08-25 MED ORDER — PHENYLEPH-PROMETHAZINE-COD 5-6.25-10 MG/5ML PO SYRP: 5.0000 mL | ORAL_SOLUTION | Freq: Four times a day (QID) | ORAL | Status: DC | PRN

## 2014-08-25 NOTE — Progress Notes (Signed)
   Subjective:    Patient ID: Rachel [name], female    DOB: Jan 31, 1941, 73 y.o.   MRN: [id number]  Chief Complaint  Patient presents with  . Sore Throat    sore throat, cough, ears hurt, sinus pressure, x3 days    HPI:  Rachel [name] is a 73 y.o. female who presents today for an acute visit.  Acute symptoms started about 3 days ago with sore throat, cough, and sinus pressure on the right side. Also describes right ear pain. Denies fever or chills. Attempted to treat with Claritin and 600 mg motrin, but nothing has seemed to help. Over the course of the past 3 days she is getting worse.  Allergies  Allergen Reactions  . Aspirin Nausea And Vomiting    Uncoated aspirin Jittery GI upset  . Codeine Sulfate Nausea And Vomiting    Heart races.  . Glimepiride Diarrhea  . Metformin Diarrhea  . Propoxyphene N-Acetaminophen Nausea And Vomiting    Current Outpatient Prescriptions on File Prior to Visit  Medication Sig Dispense Refill  . albuterol (PROAIR HFA) 108 (90 BASE) MCG/ACT inhaler INHALE 2 PUFFS INTO THE LUNGS EVERY 6 (SIX) HOURS AS NEEDED FOR WHEEZING. 25.5 each 0  . aspirin 325 MG EC tablet Take 325 mg by mouth daily.    . fluticasone (FLONASE) 50 MCG/ACT nasal spray Place 2 sprays into both nostrils daily. 16 g 6  . glimepiride (AMARYL) 1 MG tablet Take 1 mg by mouth 2 (two) times daily.    [name] ibuprofen (ADVIL,MOTRIN) 600 MG tablet Take 1 tablet (600 mg total) by mouth 2 (two) times daily as needed. 60 tablet 0  . loratadine (CLARITIN) 10 MG tablet Take 1 tablet (10 mg total) by mouth daily. 30 tablet 11  . losartan-hydrochlorothiazide (HYZAAR) 100-25 MG per tablet TAKE 1 TABLET BY MOUTH DAILY. 30 tablet 1  . nystatin (MYCOSTATIN) 100000 UNIT/ML suspension Take 5 mLs (500,000 Units total) by mouth 4 (four) times daily. 60 mL 0   No current facility-administered medications on file prior to visit.    Review of Systems    See HPI   Objective:    BP 154/82 mmHg   Pulse 76  Temp(Src) 98.3 F (36.8 C) (Oral)  Resp 18  Ht 5\' 3"  (1.6 m)  Wt 202 lb 12.8 oz (91.989 kg)  BMI 35.93 kg/m2  SpO2 98% Nursing note and vital signs reviewed.  Physical Exam  Constitutional: She is oriented to person, place, and time. She appears well-developed and well-nourished. No distress.  HENT:  Right Ear: Hearing, tympanic membrane, external ear and ear canal normal.  Left Ear: Hearing, tympanic membrane, external ear and ear canal normal.  Nose: Right sinus exhibits no maxillary sinus tenderness and no frontal sinus tenderness. Left sinus exhibits no maxillary sinus tenderness and no frontal sinus tenderness.  Mouth/Throat: Uvula is midline and mucous membranes are normal. Posterior oropharyngeal erythema present. No oropharyngeal exudate, posterior oropharyngeal edema or tonsillar abscesses.  Cardiovascular: Normal rate, regular rhythm, normal heart sounds and intact distal pulses.   Pulmonary/Chest: Effort normal and breath sounds normal.  Lymphadenopathy:    She has cervical adenopathy.  Neurological: She is alert and oriented to person, place, and time.  Skin: Skin is warm and dry.  Psychiatric: She has a normal mood and affect. Her behavior is normal. Judgment and thought content normal.       Assessment & Plan:

## 2014-08-25 NOTE — Progress Notes (Signed)
Pre visit review using our clinic review tool, if applicable. No additional management support is needed unless otherwise documented below in the visit note. 

## 2014-08-25 NOTE — Assessment & Plan Note (Signed)
Symptoms and exam consistent with bacterial pharyngitis. Start azithromycin. Refill Phenergan with codeine cough syrup. Continue over-the-counter medications as needed for symptom relief. Try honey or Cepacol for sore throat. May also include ibuprofen if needed for soreness and pain. Follow up if symptoms worsen or fail to improve

## 2014-08-25 NOTE — Patient Instructions (Signed)
Thank you for choosing [facility].  Summary/Instructions:  Your prescription(s) have been submitted to your pharmacy. Please take as directed and contact our office if you believe you are having problem(s) with the medication(s).  If your symptoms worsen or fail to improve, please contact our office for further instruction, or in case of emergency go directly to the emergency room at the closest medical facility.   Pharyngitis Pharyngitis is redness, pain, and swelling (inflammation) of your pharynx.  CAUSES  Pharyngitis is usually caused by infection. Most of the time, these infections are from viruses (viral) and are part of a cold. However, sometimes pharyngitis is caused by bacteria (bacterial). Pharyngitis can also be caused by allergies. Viral pharyngitis may be spread from person to person by coughing, sneezing, and personal items or utensils (cups, forks, spoons, toothbrushes). Bacterial pharyngitis may be spread from person to person by more intimate contact, such as kissing.  SIGNS AND SYMPTOMS  Symptoms of pharyngitis include:   Sore throat.   Tiredness (fatigue).   Low-grade fever.   Headache.  Joint pain and muscle aches.  Skin rashes.  Swollen lymph nodes.  Plaque-like film on throat or tonsils (often seen with bacterial pharyngitis). DIAGNOSIS  Your health care provider will ask you questions about your illness and your symptoms. Your medical history, along with a physical exam, is often all that is needed to diagnose pharyngitis. Sometimes, a rapid strep test is done. Other lab tests may also be done, depending on the suspected cause.  TREATMENT  Viral pharyngitis will usually get better in 3-4 days without the use of medicine. Bacterial pharyngitis is treated with medicines that kill germs (antibiotics).  HOME CARE INSTRUCTIONS   Drink enough water and fluids to keep your urine clear or pale yellow.   Only take over-the-counter or prescription  medicines as directed by your health care provider:   If you are prescribed antibiotics, make sure you finish them even if you start to feel better.   Do not take aspirin.   Get lots of rest.   Gargle with 8 oz of salt water ( tsp of salt per 1 qt of water) as often as every 1-2 hours to soothe your throat.   Throat lozenges (if you are not at risk for choking) or sprays may be used to soothe your throat. SEEK MEDICAL CARE IF:   You have large, tender lumps in your neck.  You have a rash.  You cough up green, yellow-brown, or bloody spit. SEEK IMMEDIATE MEDICAL CARE IF:   Your neck becomes stiff.  You drool or are unable to swallow liquids.  You vomit or are unable to keep medicines or liquids down.  You have severe pain that does not go away with the use of recommended medicines.  You have trouble breathing (not caused by a stuffy nose). MAKE SURE YOU:   Understand these instructions.  Will watch your condition.  Will get help right away if you are not doing well or get worse. Document Released: 09/10/2005 Document Revised: 07/01/2013 Document Reviewed: 05/18/2013 [facility] Patient Information 2015 [place], [place]. This information is not intended to replace advice given to you by your health care provider. Make sure you discuss any questions you have with your health care provider.

## 2014-11-05 ENCOUNTER — Other Ambulatory Visit: Payer: Self-pay | Admitting: Family Medicine

## 2014-11-05 NOTE — Telephone Encounter (Signed)
Last office visit 07/03/2014.  Last refilled 07/03/2014 for #60 with no refills.  Ok to refill?

## 2014-11-13 ENCOUNTER — Encounter: Payer: Self-pay | Admitting: Family

## 2014-11-13 ENCOUNTER — Ambulatory Visit (INDEPENDENT_AMBULATORY_CARE_PROVIDER_SITE_OTHER): Payer: PPO | Admitting: Family

## 2014-11-13 VITALS — BP 142/70 | HR 71 | Temp 98.2°F | Ht 63.0 in | Wt 200.0 lb

## 2014-11-13 DIAGNOSIS — J0120 Acute ethmoidal sinusitis, unspecified: Secondary | ICD-10-CM

## 2014-11-13 MED ORDER — AMOXICILLIN-POT CLAVULANATE 875-125 MG PO TABS: 1.0000 | ORAL_TABLET | Freq: Two times a day (BID) | ORAL | Status: DC

## 2014-11-13 MED ORDER — BENZONATATE 100 MG PO CAPS: 100.0000 mg | ORAL_CAPSULE | Freq: Three times a day (TID) | ORAL | Status: DC | PRN

## 2014-11-13 NOTE — Patient Instructions (Addendum)
Continue flonase, claritin. Start augmentin (antibiotic) for sinus infection. You may use tessalon as needed for cough. Call if symptoms worsen or if symptoms are not improved in 3 days.

## 2014-11-13 NOTE — Progress Notes (Signed)
Pre visit review using our clinic review tool, if applicable. No additional management support is needed unless otherwise documented below in the visit note. 

## 2014-11-13 NOTE — Progress Notes (Signed)
Subjective:    Patient ID: Rachel [name], female    DOB: [birth year], 74 y.o.   MRN: [id number]  HPI  Rachel [name] is a 74 yr old female who presents today with complaint of nasal congestion.  + nasal drainage, cough, hoarseness, chest congestion.  Has been using albuterol. She took claritin yesterday which helped "a little."  She continues flonase. Reports that she has not been checking her sugars regularly. Denies fever. Reports that her symptoms started about 2 weeks ago, but they are getting worse.  She died have one episode of epistaxis. Reports that the nasal drainage "chokes me up." She reports pressure on either side of her nose. She has right sided otalgia as well.   Review of Systems See HPI  Past Medical History  Diagnosis Date  . Allergic rhinitis   . Diabetes mellitus, type 2   . Hypertension   . Osteoarthritis of knee   . Hyperlipemia   . Lipoma NEC     anterior upper chest    History   Social History  . Marital Status: Married    Spouse Name: N/A  . Number of Children: N/A  . Years of Education: N/A   Occupational History  . department mang [employer]   Social History Main Topics  . Smoking status: Never Smoker   . Smokeless tobacco: Not on file  . Alcohol Use: No  . Drug Use: No  . Sexual Activity: Yes   Other Topics Concern  . Not on file   Social History Narrative   No regular exercise   Widowed    Past Surgical History  Procedure Laterality Date  . Vaginal hysterectomy  1985    complete  . Oophorectomy  1985  . Appendectomy  1963  . Acne cyst removal  1970    back    Family History  Problem Relation Age of Onset  . Cancer Father     brain  . Diabetes Mother   . Heart attack Mother   . Diabetes Sister   . Diabetes Brother   . Hypertension Sister   . Hypertension Brother   . Aneurysm Other     niece  . Coronary artery disease Brother     Allergies  Allergen Reactions  . Aspirin Nausea And Vomiting    Uncoated  aspirin Jittery GI upset  . Codeine Sulfate Nausea And Vomiting    Heart races.  . Glimepiride Diarrhea  . Metformin Diarrhea  . Propoxyphene N-Acetaminophen Nausea And Vomiting    Current Outpatient Prescriptions on File Prior to Visit  Medication Sig Dispense Refill  . albuterol (PROAIR HFA) 108 (90 BASE) MCG/ACT inhaler INHALE 2 PUFFS INTO THE LUNGS EVERY 6 (SIX) HOURS AS NEEDED FOR WHEEZING. 25.5 each 0  . aspirin 325 MG EC tablet Take 325 mg by mouth daily.    . fluticasone (FLONASE) 50 MCG/ACT nasal spray Place 2 sprays into both nostrils daily. 16 g 6  . glimepiride (AMARYL) 1 MG tablet Take 1 mg by mouth 2 (two) times daily.    [name] ibuprofen (ADVIL,MOTRIN) 600 MG tablet TAKE 1 TABLET (600 MG TOTAL) BY MOUTH 2 (TWO) TIMES DAILY AS NEEDED. 60 tablet 0  . loratadine (CLARITIN) 10 MG tablet Take 1 tablet (10 mg total) by mouth daily. 30 tablet 11  . losartan-hydrochlorothiazide (HYZAAR) 100-25 MG per tablet TAKE 1 TABLET BY MOUTH DAILY. 30 tablet 1  . nystatin (MYCOSTATIN) 100000 UNIT/ML suspension Take 5 mLs (500,000 Units total) by  mouth 4 (four) times daily. 60 mL 0   No current facility-administered medications on file prior to visit.    BP 142/70 mmHg  Pulse 71  Temp(Src) 98.2 F (36.8 C) (Oral)  Ht 5\' 3"  (1.6 m)  Wt 200 lb (90.719 kg)  BMI 35.44 kg/m2  SpO2 98%       Objective:   Physical Exam  Constitutional: She is oriented to person, place, and time. She appears well-developed and well-nourished.  HENT:  Right Ear: Ear canal normal. Tympanic membrane is retracted. Tympanic membrane is not erythematous.  Left Ear: Tympanic membrane and ear canal normal.  Mouth/Throat: No posterior oropharyngeal edema or posterior oropharyngeal erythema.  Cardiovascular: Normal rate, regular rhythm and normal heart sounds.   No murmur heard. Pulmonary/Chest: Effort normal and breath sounds normal. No respiratory distress. She has no wheezes.  Lymphadenopathy:    She has no  cervical adenopathy.  Neurological: She is alert and oriented to person, place, and time.  Psychiatric: She has a normal mood and affect. Her behavior is normal. Judgment and thought content normal.          Assessment & Plan:  Symptoms most consistent with sinusitis.  Advised pt as follows:   Continue flonase, claritin. Start augmentin (antibiotic) for sinus infection. You may use tessalon as needed for cough. Call if symptoms worsen or if symptoms are not improved in 3 days.

## 2014-11-17 ENCOUNTER — Telehealth: Payer: Self-pay | Admitting: *Deleted

## 2014-11-17 NOTE — Telephone Encounter (Signed)
[facility] - Client TELEPHONE ADVICE RECORD [facility] Patient Name: Rachel [name] Gender: Female DOB: [birth year] Age: 74 Y 2 M 8 D Return Phone Number: [phone] (Primary) Address: [address] City/State/Zip: [place] [address] Client [facility] - Client Client Site [place] - Night Physician Plotnikov, Alex Contact Type Call Call Type Triage / Clinical Caller Name Faithlynn Deeley Relationship To Patient Self Return Phone Number [phone] (Primary) Chief Complaint Nasal Congestion Initial Comment Caller states wants appt to be seen for congestion, headache, ear ache, declined triage; called office backline listed, opt 8 asks to update software; caller accepted triage; Nurse Assessment Nurse: Markus Daft, RN, Sherre Poot Date/Time ([place] Time): 11/13/2014 9:20:26 AM Confirm and document reason for call. If symptomatic, describe symptoms. ---Caller states wants appt to be seen for congestion, headache, earache. She wants medication called in. Has the patient traveled out of the country within the last 30 days? ---Not Applicable Does the patient require triage? ---Declined Triage Please document clinical information provided and list any resource used. ---RN tried the office # and also got a recording that office is closed, and when option 8 chose, states "asks to update software." - RN advised office should be open today. Only option if she doesn't want to discuss s/s is to see if they are open by driving there. Medications can not be called in w/o being seen. Caller verb. understanding. Guidelines Guideline Title Affirmed Question Affirmed Notes Nurse Date/Time (Eastern Time) Disp. Time Eilene Ghazi Time) Disposition Final User 11/13/2014 9:28:00 AM Clinical Call Yes Markus Daft, RN, Sherre Poot After Care Instructions Given Call Event Type User Date / Time Description Comments User: Mayford Knife,  RN Date/Time Eilene Ghazi Time): 11/13/2014 9:27:24 AM OFFICE: please call pt for appt today! PLEASE NOTE: All timestamps contained within this report are represented as [place] Standard Time. CONFIDENTIALTY NOTICE: This fax transmission is intended only for the addressee. It contains information that is legally privileged, confidential or otherwise protected from use or disclosure. If you are not the intended recipient, you are strictly prohibited from reviewing, disclosing, copying using or disseminating any of this information or taking any action in reliance on or regarding this information. If you have received this fax in error, please notify us immediately by telephone so that we can arrange for its return to [place]. Phone: [phone], Toll-Free: [phone], Fax: [phone] Page: 2 of 2 Call Id: [id number] Referrals [facility]

## 2014-11-22 ENCOUNTER — Encounter: Payer: Self-pay | Admitting: Internal Medicine

## 2014-11-22 ENCOUNTER — Telehealth: Payer: Self-pay | Admitting: Internal Medicine

## 2014-11-22 NOTE — Telephone Encounter (Signed)
Patient a no show. Ok to reschedule?

## 2014-11-23 NOTE — Telephone Encounter (Signed)
She only had 1 no show...Johny Chess

## 2014-11-23 NOTE — Telephone Encounter (Signed)
Rachel [name], Can you tell me how many prior NS has a NS pt had? Thx

## 2014-11-24 ENCOUNTER — Other Ambulatory Visit: Payer: Self-pay | Admitting: Family

## 2015-03-15 ENCOUNTER — Telehealth: Payer: Self-pay

## 2015-03-15 NOTE — Telephone Encounter (Signed)
Pt will come in early next week for HgA1C recheck

## 2015-03-16 ENCOUNTER — Ambulatory Visit (INDEPENDENT_AMBULATORY_CARE_PROVIDER_SITE_OTHER): Payer: PPO | Admitting: Podiatry

## 2015-03-16 ENCOUNTER — Other Ambulatory Visit: Payer: Self-pay

## 2015-03-16 DIAGNOSIS — M79672 Pain in left foot: Secondary | ICD-10-CM | POA: Diagnosis not present

## 2015-03-16 DIAGNOSIS — R52 Pain, unspecified: Secondary | ICD-10-CM

## 2015-03-16 DIAGNOSIS — B351 Tinea unguium: Secondary | ICD-10-CM | POA: Diagnosis not present

## 2015-03-16 MED ORDER — HYDROCODONE-ACETAMINOPHEN 10-325 MG PO TABS: 1.0000 | ORAL_TABLET | Freq: Four times a day (QID) | ORAL | Status: DC | PRN

## 2015-03-16 NOTE — Progress Notes (Signed)
Subjective:     Patient ID: Rachel [name], female   DOB: Apr 24, 1941, 74 y.o.   MRN: [id number]  HPIThis patient returns for scheduled nail surgery for removal of nail spicules right hallux and painful thick mycotic nail left foot.   Review of Systems     Objective:   Physical Exam Objective: Review of past medical history, medications, social history and allergies were performed.  Vascular: Dorsalis pedis and posterior tibial pulses were palpable B/L, capillary refill was  WNL B/L, temperature gradient was WNL B/L   Skin:  No signs of symptoms of infection or ulcers on both feet  Nails: Two nail spicules noted right hallux.  Thick disfigured discolored mycotic nail left hallux.  Sensory: Thornell Mule monifilament WNL   Orthopedic: Orthopedic evaluation demonstrates all joints distal t ankle have full ROM without crepitus, muscle power WNL B/L     Assessment:     Onychomycosis Hallux B/L     Plan:     Nail surgery for removal of both nail spicules and toenail left hallux .  Nails were excised and phenolized under local anesthesia.  Neosporin/DSD. Home instructions given.  RTC 1 week.

## 2015-03-25 ENCOUNTER — Ambulatory Visit (INDEPENDENT_AMBULATORY_CARE_PROVIDER_SITE_OTHER): Payer: PPO | Admitting: Podiatry

## 2015-03-25 DIAGNOSIS — Z09 Encounter for follow-up examination after completed treatment for conditions other than malignant neoplasm: Secondary | ICD-10-CM

## 2015-03-25 NOTE — Progress Notes (Signed)
Subjective:     Patient ID: Rachel [name], female   DOB: [birth year], 74 y.o.   MRN: [id number]  HPIThis patient returns to the office following nail surgery one week ago.  She says she has not experience much pain.  She has been soaking and bandaging her toe as directed.   Review of Systems     Objective:   Physical Exam    Objective: Review of past medical history, medications, social history and allergies were performed.  Vascular: Dorsalis pedis and posterior tibial pulses were palpable B/L, capillary refill was  WNL B/L, temperature gradient was WNL B/L   Skin:  No signs of symptoms of infection or ulcers on both feet  Nails: No signs of redness or swelling or pain hallux B/L.  Good red granulation tissue on nail bed in absence of infection.  Sensory: Thornell Mule monifilament WNL   Orthopedic: Orthopedic evaluation demonstrates all joints distal t ankle have full ROM without crepitus, muscle power WNL B/L Assessment:     S/p nail surgery     Plan:     ROV.  Continue home instructions.

## 2015-04-08 ENCOUNTER — Ambulatory Visit (INDEPENDENT_AMBULATORY_CARE_PROVIDER_SITE_OTHER): Payer: PPO | Admitting: Podiatry

## 2015-04-08 DIAGNOSIS — Z09 Encounter for follow-up examination after completed treatment for conditions other than malignant neoplasm: Secondary | ICD-10-CM

## 2015-04-09 NOTE — Progress Notes (Signed)
Subjective:     Patient ID: Rachel [name], female   DOB: [birth year], 74 y.o.   MRN: [id number]  HPIThis patient presents to office following excision of nail and matrix both great toes.  Her right foot is dried with no pain noted.  Her left big toe is draining and is painful wearing her shoes.  She is diabetic.   Review of Systems     Objective:   Physical Exam Objective: Review of past medical history, medications, social history and allergies were performed.  Vascular: Dorsalis pedis and posterior tibial pulses were palpable B/L, capillary refill was  WNL B/L, temperature gradient was WNL B/L   Skin:  No signs of symptoms of infection or ulcers on both feet  Nails:right hallux nail has healed with no evidence of redness or swelling or pain. No drainage.  Left hallux has red inflamed tissue on nail bed with necrotic tissue  Present.  There is drainage with swelling at the proximal nail fold.  Sensory: Thornell Mule monifilament WNL   Orthopedic: Orthopedic evaluation demonstrates all joints distal t ankle have full ROM without crepitus, muscle power WNL B/L     Assessment:     s/p nail surgery     Plan:     ROV   Prescribed cephalexin 500 my  #15.  Take one bid. Continue soaks and peroxide.  RTC 2 weeks .  Debride necrotic tissue.  Neosporin/DSD

## 2015-04-22 ENCOUNTER — Ambulatory Visit (INDEPENDENT_AMBULATORY_CARE_PROVIDER_SITE_OTHER): Payer: PPO | Admitting: Podiatry

## 2015-04-22 ENCOUNTER — Encounter: Payer: Self-pay | Admitting: Podiatry

## 2015-04-22 VITALS — BP 146/74 | HR 66 | Resp 17

## 2015-04-22 DIAGNOSIS — Z09 Encounter for follow-up examination after completed treatment for conditions other than malignant neoplasm: Secondary | ICD-10-CM

## 2015-04-22 NOTE — Progress Notes (Signed)
Subjective:     Patient ID: Rachel [name], female   DOB: May 04, 1941, 74 y.o.   MRN: [id number]  HPIThis patient presents to the office following nail surgery for permanent removal of both big toenail.  She has no problem with her right toe but the left is still draining.  There is brownish cover on the surgical site left foot.  The toe looks all healed in the morning and then worsens as the day progresses.  She has been taking antibiotics since last visit.She is diabetic.   Review of Systems     Objective:   Physical Exam GENERAL APPEARANCE: Alert, conversant. Appropriately groomed. No acute distress.  VASCULAR: Pedal pulses palpable at 2/4 DP and PT bilateral.  Capillary refill time is immediate to all digits,  Proximal to distal cooling it warm to warm.  Digital hair growth is present bilateral  NEUROLOGIC: sensation is intact epicritically and protectively to 5.07 monofilament at 5/5 sites bilateral.  Light touch is intact bilateral, vibratory sensation intact bilateral, achilles tendon reflex is intact bilateral.  MUSCULOSKELETAL: acceptable muscle strength, tone and stability bilateral.  Intrinsic muscluature intact bilateral.  Rectus appearance of foot and digits noted bilateral.   DERMATOLOGIC: skin color, texture, and turgor are within normal limits.  No preulcerative lesions or ulcers  are seen, no interdigital maceration noted.  No open lesions present.   NAILS  Right toe is healed with no infection or drainage.  Her left big toe has drainage under brown membranous cover.  No redness or swelling or infection noted.      Assessment:     S/p nail surgery     Plan:     ROV  Patient says the toe is draining but no signs of infection noted.  Told her to air dry her toe and discontinue neosporin.  The toe is healing and will heal  As the toe bed dries.

## 2015-04-25 ENCOUNTER — Other Ambulatory Visit: Payer: Self-pay | Admitting: Internal Medicine

## 2015-08-11 ENCOUNTER — Telehealth: Payer: Self-pay | Admitting: Internal Medicine

## 2015-08-11 NOTE — Telephone Encounter (Signed)
Called left vm to schedule AWV with [name]

## 2015-09-15 ENCOUNTER — Ambulatory Visit (INDEPENDENT_AMBULATORY_CARE_PROVIDER_SITE_OTHER)
Admission: RE | Admit: 2015-09-15 | Discharge: 2015-09-15 | Disposition: A | Discharge status: Home / Self Care | Payer: PPO | Source: Ambulatory Visit | Attending: Internal Medicine | Admitting: Internal Medicine

## 2015-09-15 ENCOUNTER — Other Ambulatory Visit: Payer: Self-pay | Admitting: Internal Medicine

## 2015-09-15 ENCOUNTER — Ambulatory Visit (INDEPENDENT_AMBULATORY_CARE_PROVIDER_SITE_OTHER): Payer: PPO | Admitting: Internal Medicine

## 2015-09-15 ENCOUNTER — Encounter: Payer: Self-pay | Admitting: Internal Medicine

## 2015-09-15 VITALS — BP 148/60 | HR 78 | Temp 97.5°F | Wt 200.0 lb

## 2015-09-15 DIAGNOSIS — J209 Acute bronchitis, unspecified: Secondary | ICD-10-CM | POA: Diagnosis not present

## 2015-09-15 DIAGNOSIS — M2669 Other specified disorders of temporomandibular joint: Secondary | ICD-10-CM | POA: Diagnosis not present

## 2015-09-15 DIAGNOSIS — E119 Type 2 diabetes mellitus without complications: Secondary | ICD-10-CM

## 2015-09-15 DIAGNOSIS — I10 Essential (primary) hypertension: Secondary | ICD-10-CM

## 2015-09-15 DIAGNOSIS — M26649 Arthritis of unspecified temporomandibular joint: Secondary | ICD-10-CM | POA: Insufficient documentation

## 2015-09-15 DIAGNOSIS — Z00129 Encounter for routine child health examination without abnormal findings: Secondary | ICD-10-CM

## 2015-09-15 MED ORDER — MELOXICAM 15 MG PO TABS: 15.0000 mg | ORAL_TABLET | Freq: Every day | ORAL | Status: DC | PRN

## 2015-09-15 MED ORDER — LOSARTAN POTASSIUM-HCTZ 100-25 MG PO TABS
ORAL_TABLET | ORAL | Status: DC
Start: 2015-09-15 — End: 2017-02-19

## 2015-09-15 MED ORDER — PROMETHAZINE-CODEINE 6.25-10 MG/5ML PO SYRP: 5.0000 mL | ORAL_SOLUTION | ORAL | Status: DC | PRN

## 2015-09-15 MED ORDER — AZITHROMYCIN 250 MG PO TABS: ORAL_TABLET | ORAL | Status: DC

## 2015-09-15 NOTE — Assessment & Plan Note (Signed)
Z pac Prom-cod syr prn 

## 2015-09-15 NOTE — Assessment & Plan Note (Signed)
Meloxicam Jaw X ray Will ref out if needed

## 2015-09-15 NOTE — Assessment & Plan Note (Signed)
Labs

## 2015-09-15 NOTE — Progress Notes (Signed)
Subjective:  Patient ID: Rachel [name], female    DOB: 1941/06/02  Age: 74 y.o. MRN: [id number]  CC: No chief complaint on file.   HPI Rachel [name] presents for cough, sinus congestion, R earache x 2 wks - worse  Outpatient Prescriptions Prior to Visit  Medication Sig Dispense Refill  . albuterol (PROAIR HFA) 108 (90 BASE) MCG/ACT inhaler INHALE 2 PUFFS INTO THE LUNGS EVERY 6 (SIX) HOURS AS NEEDED FOR WHEEZING. 25.5 each 0  . aspirin 325 MG EC tablet Take 325 mg by mouth daily.    . fluticasone (FLONASE) 50 MCG/ACT nasal spray Place 2 sprays into both nostrils daily. 16 g 6  . glimepiride (AMARYL) 1 MG tablet Take 1 mg by mouth 2 (two) times daily.    [name] loratadine (CLARITIN) 10 MG tablet Take 1 tablet (10 mg total) by mouth daily. 30 tablet 11  . losartan-hydrochlorothiazide (HYZAAR) 100-25 MG per tablet TAKE 1 TABLET BY MOUTH DAILY. 30 tablet 1   No facility-administered medications prior to visit.    ROS Review of Systems  Constitutional: Positive for fatigue. Negative for chills, activity change, appetite change and unexpected weight change.  HENT: Positive for ear pain, postnasal drip, sinus pressure and sore throat. Negative for congestion and mouth sores.   Eyes: Negative for visual disturbance.  Respiratory: Positive for cough. Negative for chest tightness.   Gastrointestinal: Negative for nausea and abdominal pain.  Genitourinary: Negative for frequency, difficulty urinating and vaginal pain.  Musculoskeletal: Negative for back pain and gait problem.  Skin: Negative for pallor and rash.  Neurological: Negative for dizziness, tremors, weakness, numbness and headaches.  Psychiatric/Behavioral: Negative for confusion and sleep disturbance.    Objective:  BP 148/60 mmHg  Pulse 78  Temp(Src) 97.5 F (36.4 C) (Oral)  Wt 200 lb (90.719 kg)  SpO2 98%  BP Readings from Last 3 Encounters:  09/15/15 148/60  04/22/15 146/74  11/13/14 142/70    Wt Readings  from Last 3 Encounters:  09/15/15 200 lb (90.719 kg)  11/13/14 200 lb (90.719 kg)  08/25/14 202 lb 12.8 oz (91.989 kg)    Physical Exam  Constitutional: She appears well-developed. No distress.  HENT:  Head: Normocephalic.  Right Ear: External ear normal.  Left Ear: External ear normal.  Nose: Nose normal.  Mouth/Throat: Oropharynx is clear and moist.  Eyes: Conjunctivae are normal. Pupils are equal, round, and reactive to light. Right eye exhibits no discharge. Left eye exhibits no discharge.  Neck: Normal range of motion. Neck supple. No JVD present. No tracheal deviation present. No thyromegaly present.  Cardiovascular: Normal rate, regular rhythm and normal heart sounds.   Pulmonary/Chest: No stridor. No respiratory distress. She has no wheezes.  Abdominal: Soft. Bowel sounds are normal. She exhibits no distension and no mass. There is no tenderness. There is no rebound and no guarding.  Musculoskeletal: She exhibits no edema or tenderness.  Lymphadenopathy:    She has no cervical adenopathy.  Neurological: She displays normal reflexes. No cranial nerve deficit. She exhibits normal muscle tone. Coordination normal.  Skin: No rash noted. No erythema.  Psychiatric: She has a normal mood and affect. Her behavior is normal. Judgment and thought content normal.  Eryth throat R TMJ is tender and feels fuller - no LNs, no mass  Lab Results  Component Value Date   WBC 8.2 03/14/2011   HGB 12.3 03/14/2011   HCT 36.3 03/14/2011   PLT 312.0 03/14/2011   GLUCOSE 276* 01/08/2014   CHOL  198 01/08/2014   TRIG 191.0* 01/08/2014   HDL 40.20 01/08/2014   LDLDIRECT 150.2 03/14/2011   LDLCALC 120* 01/08/2014   ALT 19 06/25/2011   AST 18 06/25/2011   NA 134* 01/08/2014   K 3.8 01/08/2014   CL 100 01/08/2014   CREATININE 0.7 01/08/2014   BUN 17 01/08/2014   CO2 26 01/08/2014   TSH 2.21 03/14/2011   HGBA1C 12.6* 01/08/2014   MICROALBUR 0.7 01/08/2014    Dg Cervical Spine  Complete  02/26/2012  *RADIOLOGY REPORT* Clinical Data: Intermittent left neck pain, fell 2 months ago CERVICAL SPINE - COMPLETE 4+ VIEW Comparison: None Findings: Prevertebral soft tissues normal thickness. Vertebral body and disc space heights maintained. No acute fracture, subluxation or bone destruction. Bony foramina patent. Few scattered clothing artifacts. Mild scattered facet degenerative changes. C1-C2 alignment normal. IMPRESSION: No acute cervical spine abnormalities. Original Report Authenticated By: Burnetta Sabin, M.D.   Assessment & Plan:   There are no diagnoses linked to this encounter. I am having Ms. Dickerman maintain her glimepiride, aspirin, loratadine, fluticasone, albuterol, and losartan-hydrochlorothiazide.  No orders of the defined types were placed in this encounter.     Follow-up: No Follow-up on file.  Walker Kehr, MD

## 2015-09-15 NOTE — Progress Notes (Signed)
Pre visit review using our clinic review tool, if applicable. No additional management support is needed unless otherwise documented below in the visit note. 

## 2015-09-15 NOTE — Assessment & Plan Note (Signed)
Losartan HCT 

## 2015-09-20 ENCOUNTER — Telehealth: Payer: Self-pay

## 2015-09-20 NOTE — Telephone Encounter (Signed)
Called patient and left message for her to call us back. Let her know that her xray for TMJ was normal. If she has any questions or you would like for her to talk to [place] send her back   Rachel [name]

## 2015-10-13 ENCOUNTER — Other Ambulatory Visit: Payer: Self-pay | Admitting: Internal Medicine

## 2015-10-19 NOTE — Telephone Encounter (Signed)
Called [facility] to [facility] 

## 2015-12-12 ENCOUNTER — Other Ambulatory Visit (INDEPENDENT_AMBULATORY_CARE_PROVIDER_SITE_OTHER): Payer: PPO

## 2015-12-12 DIAGNOSIS — E119 Type 2 diabetes mellitus without complications: Secondary | ICD-10-CM | POA: Diagnosis not present

## 2015-12-12 DIAGNOSIS — Z00129 Encounter for routine child health examination without abnormal findings: Secondary | ICD-10-CM

## 2015-12-12 DIAGNOSIS — M2669 Other specified disorders of temporomandibular joint: Secondary | ICD-10-CM | POA: Diagnosis not present

## 2015-12-12 DIAGNOSIS — J209 Acute bronchitis, unspecified: Secondary | ICD-10-CM

## 2015-12-12 DIAGNOSIS — I10 Essential (primary) hypertension: Secondary | ICD-10-CM

## 2015-12-12 DIAGNOSIS — M26649 Arthritis of unspecified temporomandibular joint: Secondary | ICD-10-CM

## 2015-12-12 LAB — URINALYSIS, ROUTINE W REFLEX MICROSCOPIC
Bilirubin Urine: NEGATIVE
HGB URINE DIPSTICK: NEGATIVE
KETONES UR: NEGATIVE
Nitrite: POSITIVE — AB
SPECIFIC GRAVITY, URINE: 1.025 (ref 1.000–1.030)
Total Protein, Urine: NEGATIVE
URINE GLUCOSE: 250 — AB
UROBILINOGEN UA: 1 (ref 0.0–1.0)
pH: 6 (ref 5.0–8.0)

## 2015-12-12 LAB — HEPATIC FUNCTION PANEL
ALT: 14 U/L (ref 0–35)
AST: 15 U/L (ref 0–37)
Albumin: 4 g/dL (ref 3.5–5.2)
Alkaline Phosphatase: 81 U/L (ref 39–117)
BILIRUBIN DIRECT: 0.1 mg/dL (ref 0.0–0.3)
BILIRUBIN TOTAL: 0.7 mg/dL (ref 0.2–1.2)
Total Protein: 8.1 g/dL (ref 6.0–8.3)

## 2015-12-12 LAB — BASIC METABOLIC PANEL
BUN: 16 mg/dL (ref 6–23)
CALCIUM: 9.7 mg/dL (ref 8.4–10.5)
CO2: 26 mEq/L (ref 19–32)
CREATININE: 0.7 mg/dL (ref 0.40–1.20)
Chloride: 99 mEq/L (ref 96–112)
GFR: 105.7 mL/min (ref >60.00)
Glucose, Bld: 262 mg/dL — ABNORMAL HIGH (ref 70–99)
Potassium: 4.2 mEq/L (ref 3.5–5.1)
Sodium: 133 mEq/L — ABNORMAL LOW (ref 135–145)

## 2015-12-12 LAB — LIPID PANEL
CHOL/HDL RATIO: 4
Cholesterol: 212 mg/dL — ABNORMAL HIGH (ref 0–200)
HDL: 47.1 mg/dL (ref >39.00)
LDL Cholesterol: 135 mg/dL — ABNORMAL HIGH (ref 0–99)
NONHDL: 164.64
TRIGLYCERIDES: 146 mg/dL (ref 0.0–149.0)
VLDL: 29.2 mg/dL (ref 0.0–40.0)

## 2015-12-12 LAB — TSH: TSH: 2.58 u[IU]/mL (ref 0.35–4.50)

## 2015-12-12 LAB — MICROALBUMIN / CREATININE URINE RATIO
CREATININE, U: 159.4 mg/dL
MICROALB UR: 1.8 mg/dL (ref 0.0–1.9)
Microalb Creat Ratio: 1.1 mg/g (ref 0.0–30.0)

## 2015-12-13 LAB — HEPATITIS C ANTIBODY: HCV AB: NEGATIVE

## 2015-12-14 ENCOUNTER — Encounter: Payer: Self-pay | Admitting: Internal Medicine

## 2015-12-14 ENCOUNTER — Ambulatory Visit (INDEPENDENT_AMBULATORY_CARE_PROVIDER_SITE_OTHER): Payer: PPO | Admitting: Internal Medicine

## 2015-12-14 VITALS — BP 130/70 | HR 63 | Ht 63.0 in | Wt 202.0 lb

## 2015-12-14 DIAGNOSIS — J069 Acute upper respiratory infection, unspecified: Secondary | ICD-10-CM

## 2015-12-14 DIAGNOSIS — E1100 Type 2 diabetes mellitus with hyperosmolarity without nonketotic hyperglycemic-hyperosmolar coma (NKHHC): Secondary | ICD-10-CM | POA: Diagnosis not present

## 2015-12-14 DIAGNOSIS — Z0001 Encounter for general adult medical examination with abnormal findings: Secondary | ICD-10-CM | POA: Diagnosis not present

## 2015-12-14 DIAGNOSIS — Z1231 Encounter for screening mammogram for malignant neoplasm of breast: Secondary | ICD-10-CM

## 2015-12-14 DIAGNOSIS — N390 Urinary tract infection, site not specified: Secondary | ICD-10-CM

## 2015-12-14 DIAGNOSIS — Z0000 Encounter for general adult medical examination without abnormal findings: Secondary | ICD-10-CM | POA: Insufficient documentation

## 2015-12-14 DIAGNOSIS — E2839 Other primary ovarian failure: Secondary | ICD-10-CM

## 2015-12-14 MED ORDER — GLIMEPIRIDE 1 MG PO TABS: 1.0000 mg | ORAL_TABLET | Freq: Every day | ORAL | Status: DC

## 2015-12-14 MED ORDER — CEFUROXIME AXETIL 500 MG PO TABS: 500.0000 mg | ORAL_TABLET | Freq: Two times a day (BID) | ORAL | Status: DC

## 2015-12-14 MED ORDER — IBUPROFEN 600 MG PO TABS: 600.0000 mg | ORAL_TABLET | Freq: Two times a day (BID) | ORAL | Status: DC | PRN

## 2015-12-14 NOTE — Assessment & Plan Note (Addendum)
Worse Glimeperide - pls restart
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
## 2015-12-14 NOTE — Progress Notes (Signed)
Pre visit review using our clinic review tool, if applicable. No additional management support is needed unless otherwise documented below in the visit note. 

## 2015-12-14 NOTE — Assessment & Plan Note (Signed)

## 2015-12-14 NOTE — Patient Instructions (Addendum)
[name] , Thank you for taking time to come for your Medicare Wellness Visit. I appreciate your ongoing commitment to your health goals. Please review the following plan we discussed and let me know if I can assist you in the future.   [facility]   Will think about having a bone density scan;   Have someone rails placed in bathroom  Will have eye exam in the near future/  Will consider 2nd series of Pneumonia vaccination; Prevnar 13;   These are the goals we discussed: Goals    . Exercise 150 minutes per week (moderate activity)     Will try to go to community center and walk around trail  Will try to walk and build up to 30 minutes a day; especially;        This is a list of the screening recommended for you and due dates:  Health Maintenance  Topic Date Due  . Colon Cancer Screening  09/04/1993  . Shingles Vaccine  09/05/2003  . DEXA scan (bone density measurement)  09/04/2008  . Pneumonia vaccines (2 of 2 - PCV13) 09/15/2009  . Eye exam for diabetics  05/25/2010  . Complete foot exam   02/18/2014  . Hemoglobin A1C  07/10/2014  . Flu Shot  05/15/2016*  . Tetanus Vaccine  09/15/2018  *Topic was postponed. The date shown is not the original due date.   Pneumococcal Vaccine, Polyvalent suspension for injection What is this medicine? PNEUMOCOCCAL VACCINE (NEU mo KOK al vak SEEN) is a vaccine used to prevent pneumococcus bacterial infections. These bacteria can cause serious infections like pneumonia, meningitis, and blood infections. This vaccine will lower your chance of getting pneumonia. If you do get pneumonia, it can make your symptoms milder and your illness shorter. This vaccine will not treat an infection and will not cause infection. This vaccine is recommended for infants and young children, adults with certain medical conditions, and adults 35 years or older. This medicine may be used for other purposes; ask your health  care provider or pharmacist if you have questions. What should I tell my health care provider before I take this medicine? They need to know if you have any of these conditions: -bleeding problems -fever -immune system problems -an unusual or allergic reaction to pneumococcal vaccine, diphtheria toxoid, other vaccines, latex, other medicines, foods, dyes, or preservatives -pregnant or trying to get pregnant -breast-feeding How should I use this medicine? This vaccine is for injection into a muscle. It is given by a health care professional. A copy of Vaccine Information Statements will be given before each vaccination. Read this sheet carefully each time. The sheet may change frequently. Talk to your pediatrician regarding the use of this medicine in children. While this drug may be prescribed for children as young as 18 weeks old for selected conditions, precautions do apply. Overdosage: If you think you have taken too much of this medicine contact a poison control center or emergency room at once. NOTE: This medicine is only for you. Do not share this medicine with others. What if I miss a dose? It is important not to miss your dose. Call your doctor or health care professional if you are unable to keep an appointment. What may interact with this medicine? -medicines for cancer chemotherapy -medicines that suppress your immune function -steroid medicines like prednisone or cortisone This list may not describe all possible interactions. Give your health care provider a list of all the  medicines, herbs, non-prescription drugs, or dietary supplements you use. Also tell them if you smoke, drink alcohol, or use illegal drugs. Some items may interact with your medicine. What should I watch for while using this medicine? Mild fever and pain should go away in 3 days or less. Report any unusual symptoms to your doctor or health care professional. What side effects may I notice from receiving this  medicine? Side effects that you should report to your doctor or health care professional as soon as possible: -allergic reactions like skin rash, itching or hives, swelling of the face, lips, or tongue -breathing problems -confused -fast or irregular heartbeat -fever over 102 degrees F -seizures -unusual bleeding or bruising -unusual muscle weakness Side effects that usually do not require medical attention (report to your doctor or health care professional if they continue or are bothersome): -aches and pains -diarrhea -fever of 102 degrees F or less -headache -irritable -loss of appetite -pain, tender at site where injected -trouble sleeping This list may not describe all possible side effects. Call your doctor for medical advice about side effects. You may report side effects to FDA at 1-800-FDA-1088. Where should I keep my medicine? This does not apply. This vaccine is given in a clinic, pharmacy, doctor's office, or other health care setting and will not be stored at home. NOTE: This sheet is a summary. It may not cover all possible information. If you have questions about this medicine, talk to your doctor, pharmacist, or health care provider.    2016, Elsevier/Gold Standard. (2014-06-17 10:27:27)  Bone Densitometry Bone densitometry is an imaging test that uses a special X-ray to measure the amount of calcium and other minerals in your bones (bone density). This test is also known as a bone mineral density test or dual-energy X-ray absorptiometry (DXA). The test can measure bone density at your hip and your spine. It is similar to having a regular X-ray. You may have this test to:  Diagnose a condition that causes weak or thin bones (osteoporosis).  Predict your risk of a broken bone (fracture).  Determine how well osteoporosis treatment is working. LET [facility] CARE PROVIDER KNOW ABOUT:  Any allergies you have.  All medicines you are taking, including vitamins,  herbs, eye drops, creams, and over-the-counter medicines.  Previous problems you or members of your family have had with the use of anesthetics.  Any blood disorders you have.  Previous surgeries you have had.  Medical conditions you have.  Possibility of pregnancy.  Any other medical test you had within the previous 14 days that used contrast material. RISKS AND COMPLICATIONS Generally, this is a safe procedure. However, problems can occur and may include the following:  This test exposes you to a very small amount of radiation.  The risks of radiation exposure may be greater to unborn children. BEFORE THE PROCEDURE  Do not take any calcium supplements for 24 hours before having the test. You can otherwise eat and drink what you usually do.  Take off all metal jewelry, eyeglasses, dental appliances, and any other metal objects. PROCEDURE  You may lie on an exam table. There will be an X-ray generator below you and an imaging device above you.  Other devices, such as boxes or braces, may be used to position your body properly for the scan.  You will need to lie still while the machine slowly scans your body.  The images will show up on a computer monitor. AFTER THE PROCEDURE You may need  more testing at a later time.   This information is not intended to replace advice given to you by your health care provider. Make sure you discuss any questions you have with your health care provider.   Document Released: 10/02/2004 Document Revised: 10/01/2014 Document Reviewed: 02/18/2014 Elsevier Interactive Patient Education 2016 [place] for Adults, Female A healthy lifestyle and preventive care can promote health and wellness. Preventive health guidelines for women include the following key practices.  A routine yearly physical is a good way to check with your health care provider about your health and preventive screening. It is a chance to share any  concerns and updates on your health and to receive a thorough exam.  Visit your dentist for a routine exam and preventive care every 6 months. Brush your teeth twice a day and floss once a day. Good oral hygiene prevents tooth decay and gum disease.  The frequency of eye exams is based on your age, health, family medical history, use of contact lenses, and other factors. Follow your health care provider's recommendations for frequency of eye exams.  Eat a healthy diet. Foods like vegetables, fruits, whole grains, low-fat dairy products, and lean protein foods contain the nutrients you need without too many calories. Decrease your intake of foods high in solid fats, added sugars, and salt. Eat the right amount of calories for you.Get information about a proper diet from your health care provider, if necessary.  Regular physical exercise is one of the most important things you can do for your health. Most adults should get at least 150 minutes of moderate-intensity exercise (any activity that increases your heart rate and causes you to sweat) each week. In addition, most adults need muscle-strengthening exercises on 2 or more days a week.  Maintain a healthy weight. The body mass index (BMI) is a screening tool to identify possible weight problems. It provides an estimate of body fat based on height and weight. Your health care provider can find your BMI and can help you achieve or maintain a healthy weight.For adults 20 years and older:  A BMI below 18.5 is considered underweight.  A BMI of 18.5 to 24.9 is normal.  A BMI of 25 to 29.9 is considered overweight.  A BMI of 30 and above is considered obese.  Maintain normal blood lipids and cholesterol levels by exercising and minimizing your intake of saturated fat. Eat a balanced diet with plenty of fruit and vegetables. Blood tests for lipids and cholesterol should begin at age 31 and be repeated every 5 years. If your lipid or cholesterol levels  are high, you are over 50, or you are at high risk for heart disease, you may need your cholesterol levels checked more frequently.Ongoing high lipid and cholesterol levels should be treated with medicines if diet and exercise are not working.  If you smoke, find out from your health care provider how to quit. If you do not use tobacco, do not start.  Lung cancer screening is recommended for adults aged 59-80 years who are at high risk for developing lung cancer because of a history of smoking. A yearly low-dose CT scan of the lungs is recommended for people who have at least a 30-pack-year history of smoking and are a current smoker or have quit within the past 15 years. A pack year of smoking is smoking an average of 1 pack of cigarettes a day for 1 year (for example: 1 pack a day for  30 years or 2 packs a day for 15 years). Yearly screening should continue until the smoker has stopped smoking for at least 15 years. Yearly screening should be stopped for people who develop a health problem that would prevent them from having lung cancer treatment.  If you are pregnant, do not drink alcohol. If you are breastfeeding, be very cautious about drinking alcohol. If you are not pregnant and choose to drink alcohol, do not have more than 1 drink per day. One drink is considered to be 12 ounces (355 mL) of beer, 5 ounces (148 mL) of wine, or 1.5 ounces (44 mL) of liquor.  Avoid use of street drugs. Do not share needles with anyone. Ask for help if you need support or instructions about stopping the use of drugs.  High blood pressure causes heart disease and increases the risk of stroke. Your blood pressure should be checked at least every 1 to 2 years. Ongoing high blood pressure should be treated with medicines if weight loss and exercise do not work.  If you are 76-40 years old, ask your health care provider if you should take aspirin to prevent strokes.  Diabetes screening is done by taking a blood sample  to check your blood glucose level after you have not eaten for a certain period of time (fasting). If you are not overweight and you do not have risk factors for diabetes, you should be screened once every 3 years starting at age 23. If you are overweight or obese and you are 21-47 years of age, you should be screened for diabetes every year as part of your cardiovascular risk assessment.  Breast cancer screening is essential preventive care for women. You should practice "breast self-awareness." This means understanding the normal appearance and feel of your breasts and may include breast self-examination. Any changes detected, no matter how small, should be reported to a health care provider. Women in their 14s and 30s should have a clinical breast exam (CBE) by a health care provider as part of a regular health exam every 1 to 3 years. After age 6, women should have a CBE every year. Starting at age 58, women should consider having a mammogram (breast X-ray test) every year. Women who have a family history of breast cancer should talk to their health care provider about genetic screening. Women at a high risk of breast cancer should talk to their health care providers about having an MRI and a mammogram every year.  Breast cancer gene (BRCA)-related cancer risk assessment is recommended for women who have family members with BRCA-related cancers. BRCA-related cancers include breast, ovarian, tubal, and peritoneal cancers. Having family members with these cancers may be associated with an increased risk for harmful changes (mutations) in the breast cancer genes BRCA1 and BRCA2. Results of the assessment will determine the need for genetic counseling and BRCA1 and BRCA2 testing.  Your health care provider may recommend that you be screened regularly for cancer of the pelvic organs (ovaries, uterus, and vagina). This screening involves a pelvic examination, including checking for microscopic changes to the  surface of your cervix (Pap test). You may be encouraged to have this screening done every 3 years, beginning at age 49.  For women ages 74-65, health care providers may recommend pelvic exams and Pap testing every 3 years, or they may recommend the Pap and pelvic exam, combined with testing for human papilloma virus (HPV), every 5 years. Some types of HPV increase your risk of cervical  cancer. Testing for HPV may also be done on women of any age with unclear Pap test results.  Other health care providers may not recommend any screening for nonpregnant women who are considered low risk for pelvic cancer and who do not have symptoms. Ask your health care provider if a screening pelvic exam is right for you.  If you have had past treatment for cervical cancer or a condition that could lead to cancer, you need Pap tests and screening for cancer for at least 20 years after your treatment. If Pap tests have been discontinued, your risk factors (such as having a new sexual partner) need to be reassessed to determine if screening should resume. Some women have medical problems that increase the chance of getting cervical cancer. In these cases, your health care provider may recommend more frequent screening and Pap tests.  Colorectal cancer can be detected and often prevented. Most routine colorectal cancer screening begins at the age of 59 years and continues through age 15 years. However, your health care provider may recommend screening at an earlier age if you have risk factors for colon cancer. On a yearly basis, your health care provider may provide home test kits to check for hidden blood in the stool. Use of a small camera at the end of a tube, to directly examine the colon (sigmoidoscopy or colonoscopy), can detect the earliest forms of colorectal cancer. Talk to your health care provider about this at age 82, when routine screening begins. Direct exam of the colon should be repeated every 5-10 years  through age 85 years, unless early forms of precancerous polyps or small growths are found.  People who are at an increased risk for hepatitis B should be screened for this virus. You are considered at high risk for hepatitis B if:  You were born in a country where hepatitis B occurs often. Talk with your health care provider about which countries are considered high risk.  Your parents were born in a high-risk country and you have not received a shot to protect against hepatitis B (hepatitis B vaccine).  You have HIV or AIDS.  You use needles to inject street drugs.  You live with, or have sex with, someone who has hepatitis B.  You get hemodialysis treatment.  You take certain medicines for conditions like cancer, organ transplantation, and autoimmune conditions.  Hepatitis C blood testing is recommended for all people born from [birth year] through 1963 and any individual with known risks for hepatitis C.  Practice safe sex. Use condoms and avoid high-risk sexual practices to reduce the spread of sexually transmitted infections (STIs). STIs include gonorrhea, chlamydia, syphilis, trichomonas, herpes, HPV, and human immunodeficiency virus (HIV). Herpes, HIV, and HPV are viral illnesses that have no cure. They can result in disability, cancer, and death.  You should be screened for sexually transmitted illnesses (STIs) including gonorrhea and chlamydia if:  You are sexually active and are younger than 24 years.  You are older than 24 years and your health care provider tells you that you are at risk for this type of infection.  Your sexual activity has changed since you were last screened and you are at an increased risk for chlamydia or gonorrhea. Ask your health care provider if you are at risk.  If you are at risk of being infected with HIV, it is recommended that you take a prescription medicine daily to prevent HIV infection. This is called preexposure prophylaxis (PrEP). You are  considered at  risk if:  You are sexually active and do not regularly use condoms or know the HIV status of your partner(s).  You take drugs by injection.  You are sexually active with a partner who has HIV.  Talk with your health care provider about whether you are at high risk of being infected with HIV. If you choose to begin PrEP, you should first be tested for HIV. You should then be tested every 3 months for as long as you are taking PrEP.  Osteoporosis is a disease in which the bones lose minerals and strength with aging. This can result in serious bone fractures or breaks. The risk of osteoporosis can be identified using a bone density scan. Women ages 5 years and over and women at risk for fractures or osteoporosis should discuss screening with their health care providers. Ask your health care provider whether you should take a calcium supplement or vitamin D to reduce the rate of osteoporosis.  Menopause can be associated with physical symptoms and risks. Hormone replacement therapy is available to decrease symptoms and risks. You should talk to your health care provider about whether hormone replacement therapy is right for you.  Use sunscreen. Apply sunscreen liberally and repeatedly throughout the day. You should seek shade when your shadow is shorter than you. Protect yourself by wearing long sleeves, pants, a wide-brimmed hat, and sunglasses year round, whenever you are outdoors.  Once a month, do a whole body skin exam, using a mirror to look at the skin on your back. Tell your health care provider of new moles, moles that have irregular borders, moles that are larger than a pencil eraser, or moles that have changed in shape or color.  Stay current with required vaccines (immunizations).  Influenza vaccine. All adults should be immunized every year.  Tetanus, diphtheria, and acellular pertussis (Td, Tdap) vaccine. Pregnant women should receive 1 dose of Tdap vaccine during each  pregnancy. The dose should be obtained regardless of the length of time since the last dose. Immunization is preferred during the 27th-36th week of gestation. An adult who has not previously received Tdap or who does not know her vaccine status should receive 1 dose of Tdap. This initial dose should be followed by tetanus and diphtheria toxoids (Td) booster doses every 10 years. Adults with an unknown or incomplete history of completing a 3-dose immunization series with Td-containing vaccines should begin or complete a primary immunization series including a Tdap dose. Adults should receive a Td booster every 10 years.  Varicella vaccine. An adult without evidence of immunity to varicella should receive 2 doses or a second dose if she has previously received 1 dose. Pregnant females who do not have evidence of immunity should receive the first dose after pregnancy. This first dose should be obtained before leaving the health care facility. The second dose should be obtained 4-8 weeks after the first dose.  Human papillomavirus (HPV) vaccine. Females aged 13-26 years who have not received the vaccine previously should obtain the 3-dose series. The vaccine is not recommended for use in pregnant females. However, pregnancy testing is not needed before receiving a dose. If a female is found to be pregnant after receiving a dose, no treatment is needed. In that case, the remaining doses should be delayed until after the pregnancy. Immunization is recommended for any person with an immunocompromised condition through the age of 70 years if she did not get any or all doses earlier. During the 3-dose series, the second  dose should be obtained 4-8 weeks after the first dose. The third dose should be obtained 24 weeks after the first dose and 16 weeks after the second dose.  Zoster vaccine. One dose is recommended for adults aged 16 years or older unless certain conditions are present.  Measles, mumps, and rubella  (MMR) vaccine. Adults born before [birth year] generally are considered immune to measles and mumps. Adults born in [birth year] or later should have 1 or more doses of MMR vaccine unless there is a contraindication to the vaccine or there is laboratory evidence of immunity to each of the three diseases. A routine second dose of MMR vaccine should be obtained at least 28 days after the first dose for students attending postsecondary schools, health care workers, or international travelers. People who received inactivated measles vaccine or an unknown type of measles vaccine during 1963-1967 should receive 2 doses of MMR vaccine. People who received inactivated mumps vaccine or an unknown type of mumps vaccine before 1979 and are at high risk for mumps infection should consider immunization with 2 doses of MMR vaccine. For females of childbearing age, rubella immunity should be determined. If there is no evidence of immunity, females who are not pregnant should be vaccinated. If there is no evidence of immunity, females who are pregnant should delay immunization until after pregnancy. Unvaccinated health care workers born before [birth year] who lack laboratory evidence of measles, mumps, or rubella immunity or laboratory confirmation of disease should consider measles and mumps immunization with 2 doses of MMR vaccine or rubella immunization with 1 dose of MMR vaccine.  Pneumococcal 13-valent conjugate (PCV13) vaccine. When indicated, a person who is uncertain of his immunization history and has no record of immunization should receive the PCV13 vaccine. All adults 49 years of age and older should receive this vaccine. An adult aged 84 years or older who has certain medical conditions and has not been previously immunized should receive 1 dose of PCV13 vaccine. This PCV13 should be followed with a dose of pneumococcal polysaccharide (PPSV23) vaccine. Adults who are at high risk for pneumococcal disease should obtain the PPSV23 vaccine at  least 8 weeks after the dose of PCV13 vaccine. Adults older than 73 years of age who have normal immune system function should obtain the PPSV23 vaccine dose at least 1 year after the dose of PCV13 vaccine.  Pneumococcal polysaccharide (PPSV23) vaccine. When PCV13 is also indicated, PCV13 should be obtained first. All adults aged 25 years and older should be immunized. An adult younger than age 59 years who has certain medical conditions should be immunized. Any person who resides in a nursing home or long-term care facility should be immunized. An adult smoker should be immunized. People with an immunocompromised condition and certain other conditions should receive both PCV13 and PPSV23 vaccines. People with human immunodeficiency virus (HIV) infection should be immunized as soon as possible after diagnosis. Immunization during chemotherapy or radiation therapy should be avoided. Routine use of PPSV23 vaccine is not recommended for American Indians, [place] Natives, or people younger than 65 years unless there are medical conditions that require PPSV23 vaccine. When indicated, people who have unknown immunization and have no record of immunization should receive PPSV23 vaccine. One-time revaccination 5 years after the first dose of PPSV23 is recommended for people aged 19-64 years who have chronic kidney failure, nephrotic syndrome, asplenia, or immunocompromised conditions. People who received 1-2 doses of PPSV23 before age 53 years should receive another dose of PPSV23 vaccine at age 82  years or later if at least 5 years have passed since the previous dose. Doses of PPSV23 are not needed for people immunized with PPSV23 at or after age 79 years.  Meningococcal vaccine. Adults with asplenia or persistent complement component deficiencies should receive 2 doses of quadrivalent meningococcal conjugate (MenACWY-D) vaccine. The doses should be obtained at least 2 months apart. Microbiologists working with certain  meningococcal bacteria, [place] recruits, people at risk during an outbreak, and people who travel to or live in countries with a high rate of meningitis should be immunized. A first-year college student up through age 48 years who is living in a residence hall should receive a dose if she did not receive a dose on or after her 16th birthday. Adults who have certain high-risk conditions should receive one or more doses of vaccine.  Hepatitis A vaccine. Adults who wish to be protected from this disease, have certain high-risk conditions, work with hepatitis A-infected animals, work in hepatitis A research labs, or travel to or work in countries with a high rate of hepatitis A should be immunized. Adults who were previously unvaccinated and who anticipate close contact with an international adoptee during the first 60 days after arrival in the [place] from a country with a high rate of hepatitis A should be immunized.  Hepatitis B vaccine. Adults who wish to be protected from this disease, have certain high-risk conditions, may be exposed to blood or other infectious body fluids, are household contacts or sex partners of hepatitis B positive people, are clients or workers in certain care facilities, or travel to or work in countries with a high rate of hepatitis B should be immunized.  Haemophilus influenzae type b (Hib) vaccine. A previously unvaccinated person with asplenia or sickle cell disease or having a scheduled splenectomy should receive 1 dose of Hib vaccine. Regardless of previous immunization, a recipient of a hematopoietic stem cell transplant should receive a 3-dose series 6-12 months after her successful transplant. Hib vaccine is not recommended for adults with HIV infection. Preventive Services / Frequency Ages 65 to 78 years  Blood pressure check.** / Every 3-5 years.  Lipid and cholesterol check.** / Every 5 years beginning at age 21.  Clinical breast exam.** / Every 3 years for  women in their 55s and 11s.  BRCA-related cancer risk assessment.** / For women who have family members with a BRCA-related cancer (breast, ovarian, tubal, or peritoneal cancers).  Pap test.** / Every 2 years from ages 51 through 81. Every 3 years starting at age 45 through age 28 or 71 with a history of 3 consecutive normal Pap tests.  HPV screening.** / Every 3 years from ages 73 through ages 23 to 57 with a history of 3 consecutive normal Pap tests.  Hepatitis C blood test.** / For any individual with known risks for hepatitis C.  Skin self-exam. / Monthly.  Influenza vaccine. / Every year.  Tetanus, diphtheria, and acellular pertussis (Tdap, Td) vaccine.** / Consult your health care provider. Pregnant women should receive 1 dose of Tdap vaccine during each pregnancy. 1 dose of Td every 10 years.  Varicella vaccine.** / Consult your health care provider. Pregnant females who do not have evidence of immunity should receive the first dose after pregnancy.  HPV vaccine. / 3 doses over 6 months, if 15 and younger. The vaccine is not recommended for use in pregnant females. However, pregnancy testing is not needed before receiving a dose.  Measles, mumps, rubella (MMR) vaccine.** /  You need at least 1 dose of MMR if you were born in 1957 or later. You may also need a 2nd dose. For females of childbearing age, rubella immunity should be determined. If there is no evidence of immunity, females who are not pregnant should be vaccinated. If there is no evidence of immunity, females who are pregnant should delay immunization until after pregnancy.  Pneumococcal 13-valent conjugate (PCV13) vaccine.** / Consult your health care provider.  Pneumococcal polysaccharide (PPSV23) vaccine.** / 1 to 2 doses if you smoke cigarettes or if you have certain conditions.  Meningococcal vaccine.** / 1 dose if you are age 66 to 12 years and a Market researcher living in a residence hall, or have one of  several medical conditions, you need to get vaccinated against meningococcal disease. You may also need additional booster doses.  Hepatitis A vaccine.** / Consult your health care provider.  Hepatitis B vaccine.** / Consult your health care provider.  Haemophilus influenzae type b (Hib) vaccine.** / Consult your health care provider. Ages 23 to 62 years  Blood pressure check.** / Every year.  Lipid and cholesterol check.** / Every 5 years beginning at age 46 years.  Lung cancer screening. / Every year if you are aged 32-80 years and have a 30-pack-year history of smoking and currently smoke or have quit within the past 15 years. Yearly screening is stopped once you have quit smoking for at least 15 years or develop a health problem that would prevent you from having lung cancer treatment.  Clinical breast exam.** / Every year after age 71 years.  BRCA-related cancer risk assessment.** / For women who have family members with a BRCA-related cancer (breast, ovarian, tubal, or peritoneal cancers).  Mammogram.** / Every year beginning at age 35 years and continuing for as long as you are in good health. Consult with your health care provider.  Pap test.** / Every 3 years starting at age 69 years through age 36 or 47 years with a history of 3 consecutive normal Pap tests.  HPV screening.** / Every 3 years from ages 79 years through ages 67 to 32 years with a history of 3 consecutive normal Pap tests.  Fecal occult blood test (FOBT) of stool. / Every year beginning at age 40 years and continuing until age 57 years. You may not need to do this test if you get a colonoscopy every 10 years.  Flexible sigmoidoscopy or colonoscopy.** / Every 5 years for a flexible sigmoidoscopy or every 10 years for a colonoscopy beginning at age 6 years and continuing until age 69 years.  Hepatitis C blood test.** / For all people born from [birth year] through 1963 and any individual with known risks for hepatitis  C.  Skin self-exam. / Monthly.  Influenza vaccine. / Every year.  Tetanus, diphtheria, and acellular pertussis (Tdap/Td) vaccine.** / Consult your health care provider. Pregnant women should receive 1 dose of Tdap vaccine during each pregnancy. 1 dose of Td every 10 years.  Varicella vaccine.** / Consult your health care provider. Pregnant females who do not have evidence of immunity should receive the first dose after pregnancy.  Zoster vaccine.** / 1 dose for adults aged 99 years or older.  Measles, mumps, rubella (MMR) vaccine.** / You need at least 1 dose of MMR if you were born in 1957 or later. You may also need a second dose. For females of childbearing age, rubella immunity should be determined. If there is no evidence of immunity, females who are  not pregnant should be vaccinated. If there is no evidence of immunity, females who are pregnant should delay immunization until after pregnancy.  Pneumococcal 13-valent conjugate (PCV13) vaccine.** / Consult your health care provider.  Pneumococcal polysaccharide (PPSV23) vaccine.** / 1 to 2 doses if you smoke cigarettes or if you have certain conditions.  Meningococcal vaccine.** / Consult your health care provider.  Hepatitis A vaccine.** / Consult your health care provider.  Hepatitis B vaccine.** / Consult your health care provider.  Haemophilus influenzae type b (Hib) vaccine.** / Consult your health care provider. Ages 66 years and over  Blood pressure check.** / Every year.  Lipid and cholesterol check.** / Every 5 years beginning at age 81 years.  Lung cancer screening. / Every year if you are aged 22-80 years and have a 30-pack-year history of smoking and currently smoke or have quit within the past 15 years. Yearly screening is stopped once you have quit smoking for at least 15 years or develop a health problem that would prevent you from having lung cancer treatment.  Clinical breast exam.** / Every year after age 10  years.  BRCA-related cancer risk assessment.** / For women who have family members with a BRCA-related cancer (breast, ovarian, tubal, or peritoneal cancers).  Mammogram.** / Every year beginning at age 69 years and continuing for as long as you are in good health. Consult with your health care provider.  Pap test.** / Every 3 years starting at age 35 years through age 71 or 17 years with 3 consecutive normal Pap tests. Testing can be stopped between 65 and 70 years with 3 consecutive normal Pap tests and no abnormal Pap or HPV tests in the past 10 years.  HPV screening.** / Every 3 years from ages 52 years through ages 19 or 66 years with a history of 3 consecutive normal Pap tests. Testing can be stopped between 65 and 70 years with 3 consecutive normal Pap tests and no abnormal Pap or HPV tests in the past 10 years.  Fecal occult blood test (FOBT) of stool. / Every year beginning at age 34 years and continuing until age 98 years. You may not need to do this test if you get a colonoscopy every 10 years.  Flexible sigmoidoscopy or colonoscopy.** / Every 5 years for a flexible sigmoidoscopy or every 10 years for a colonoscopy beginning at age 20 years and continuing until age 41 years.  Hepatitis C blood test.** / For all people born from [birth year] through 1965 and any individual with known risks for hepatitis C.  Osteoporosis screening.** / A one-time screening for women ages 49 years and over and women at risk for fractures or osteoporosis.  Skin self-exam. / Monthly.  Influenza vaccine. / Every year.  Tetanus, diphtheria, and acellular pertussis (Tdap/Td) vaccine.** / 1 dose of Td every 10 years.  Varicella vaccine.** / Consult your health care provider.  Zoster vaccine.** / 1 dose for adults aged 55 years or older.  Pneumococcal 13-valent conjugate (PCV13) vaccine.** / Consult your health care provider.  Pneumococcal polysaccharide (PPSV23) vaccine.** / 1 dose for all adults aged 11  years and older.  Meningococcal vaccine.** / Consult your health care provider.  Hepatitis A vaccine.** / Consult your health care provider.  Hepatitis B vaccine.** / Consult your health care provider.  Haemophilus influenzae type b (Hib) vaccine.** / Consult your health care provider. ** Family history and personal history of risk and conditions may change your health care provider's recommendations.   This  information is not intended to replace advice given to you by your health care provider. Make sure you discuss any questions you have with your health care provider.   Document Released: 11/06/2001 Document Revised: 10/01/2014 Document Reviewed: 02/05/2011 Elsevier Interactive Patient Education [employer].

## 2015-12-14 NOTE — Assessment & Plan Note (Signed)
R sinusitis Ceftin po

## 2015-12-14 NOTE — Progress Notes (Signed)
Subjective:  Patient ID: Callie Fielding, female    DOB: 07/10/1941  Age: 75 y.o. MRN: [id number]  CC: Medicare Wellness   HPI IFEOMA MIDURA presents for a well exam. C/o occ nose bleed on the right side at times  Outpatient Prescriptions Prior to Visit  Medication Sig Dispense Refill  . albuterol (PROAIR HFA) 108 (90 BASE) MCG/ACT inhaler INHALE 2 PUFFS INTO THE LUNGS EVERY 6 (SIX) HOURS AS NEEDED FOR WHEEZING. 25.5 each 0  . aspirin 325 MG EC tablet Take 325 mg by mouth daily.    . fluticasone (FLONASE) 50 MCG/ACT nasal spray Place 2 sprays into both nostrils daily. 16 g 6  . loratadine (CLARITIN) 10 MG tablet Take 1 tablet (10 mg total) by mouth daily. 30 tablet 11  . losartan-hydrochlorothiazide (HYZAAR) 100-25 MG tablet TAKE 1 TABLET BY MOUTH DAILY. 30 tablet 11  . meloxicam (MOBIC) 15 MG tablet Take 1 tablet (15 mg total) by mouth daily as needed for pain. 30 tablet 2  . glimepiride (AMARYL) 1 MG tablet Take 1 mg by mouth 2 (two) times daily.    [name] azithromycin (ZITHROMAX) 250 MG tablet As directed (Patient not taking: Reported on 12/14/2015) 6 tablet 0  . promethazine-codeine (PHENERGAN WITH CODEINE) 6.25-10 MG/5ML syrup TAKE 5 MLS BY MOUTH EVERY 4 HOURS AS NEEDED FOR PAIN (Patient not taking: Reported on 12/14/2015) 300 mL 0   No facility-administered medications prior to visit.    ROS Review of Systems  Constitutional: Negative for chills, activity change, appetite change, fatigue and unexpected weight change.  HENT: Negative for congestion, mouth sores and sinus pressure.   Eyes: Negative for visual disturbance.  Respiratory: Negative for cough and chest tightness.   Gastrointestinal: Negative for nausea and abdominal pain.  Genitourinary: Negative for frequency, difficulty urinating and vaginal pain.  Musculoskeletal: Positive for back pain. Negative for gait problem.  Skin: Negative for pallor and rash.  Neurological: Negative for dizziness, tremors, weakness,  numbness and headaches.  Psychiatric/Behavioral: Negative for suicidal ideas, confusion and sleep disturbance.    Objective:  BP 130/70 mmHg  Pulse 63  Ht 5\' 3"  (1.6 m)  Wt 202 lb (91.627 kg)  BMI 35.79 kg/m2  SpO2 98%  BP Readings from Last 3 Encounters:  12/14/15 130/70  09/15/15 148/60  04/22/15 146/74    Wt Readings from Last 3 Encounters:  12/14/15 202 lb (91.627 kg)  09/15/15 200 lb (90.719 kg)  11/13/14 200 lb (90.719 kg)    Physical Exam  Constitutional: She appears well-developed. No distress.  HENT:  Head: Normocephalic.  Right Ear: External ear normal.  Left Ear: External ear normal.  Nose: Nose normal.  Mouth/Throat: Oropharynx is clear and moist.  Eyes: Conjunctivae are normal. Pupils are equal, round, and reactive to light. Right eye exhibits no discharge. Left eye exhibits no discharge.  Neck: Normal range of motion. Neck supple. No JVD present. No tracheal deviation present. No thyromegaly present.  Cardiovascular: Normal rate, regular rhythm and normal heart sounds.   Pulmonary/Chest: No stridor. No respiratory distress. She has no wheezes.  Abdominal: Soft. Bowel sounds are normal. She exhibits no distension and no mass. There is no tenderness. There is no rebound and no guarding.  Musculoskeletal: She exhibits no edema or tenderness.  Lymphadenopathy:    She has no cervical adenopathy.  Neurological: She displays normal reflexes. No cranial nerve deficit. She exhibits normal muscle tone. Coordination normal.  Skin: No rash noted. No erythema.  Psychiatric: She has a normal  mood and affect. Her behavior is normal. Judgment and thought content normal.  obese R nostril is swollen  Lab Results  Component Value Date   WBC 8.2 03/14/2011   HGB 12.3 03/14/2011   HCT 36.3 03/14/2011   PLT 312.0 03/14/2011   GLUCOSE 262* 12/12/2015   CHOL 212* 12/12/2015   TRIG 146.0 12/12/2015   HDL 47.10 12/12/2015   LDLDIRECT 150.2 03/14/2011   LDLCALC 135*  12/12/2015   ALT 14 12/12/2015   AST 15 12/12/2015   NA 133* 12/12/2015   K 4.2 12/12/2015   CL 99 12/12/2015   CREATININE 0.70 12/12/2015   BUN 16 12/12/2015   CO2 26 12/12/2015   TSH 2.58 12/12/2015   HGBA1C 12.6* 01/08/2014   MICROALBUR 1.8 12/12/2015    Dg Tmj Open & Close Bilateral  09/15/2015  CLINICAL DATA:  Right ear and jaw pain, initial encounter EXAM: TEMPOROMANDIBULAR JOINTS COMPARISON:  None. FINDINGS: No acute fracture or dislocation is noted. The temporomandibular joints are within normal limits. No significant subluxation is noted IMPRESSION: No acute abnormality noted. Electronically Signed   By: Inez Catalina M.D.   On: 09/15/2015 13:54    Assessment & Plan:   Adriannah was seen today for medicare wellness.  Diagnoses and all orders for this visit:  Well adult exam  URI (upper respiratory infection)  Acute UTI  Type 2 diabetes mellitus with hyperosmolarity without coma, without long-term current use of insulin ([place])  Encounter for screening mammogram for breast cancer -     MM Digital Screening; Future  Estrogen deficiency -     DG Bone Density; Future  Medicare annual wellness visit, subsequent  Other orders -     ibuprofen (ADVIL,MOTRIN) 600 MG tablet; Take 1 tablet (600 mg total) by mouth 2 (two) times daily as needed. -     glimepiride (AMARYL) 1 MG tablet; Take 1 tablet (1 mg total) by mouth daily with breakfast. -     cefUROXime (CEFTIN) 500 MG tablet; Take 1 tablet (500 mg total) by mouth 2 (two) times daily.  I have discontinued Ms. Raspberry's azithromycin and promethazine-codeine. I have also changed her glimepiride. Additionally, I am having her start on cefUROXime. Lastly, I am having her maintain her aspirin, loratadine, fluticasone, albuterol, meloxicam, losartan-hydrochlorothiazide, and ibuprofen.  Meds ordered this encounter  Medications  . ibuprofen (ADVIL,MOTRIN) 600 MG tablet    Sig: Take 1 tablet (600 mg total) by mouth 2 (two)  times daily as needed.    Dispense:  60 tablet    Refill:  2  . glimepiride (AMARYL) 1 MG tablet    Sig: Take 1 tablet (1 mg total) by mouth daily with breakfast.    Dispense:  30 tablet    Refill:  11  . cefUROXime (CEFTIN) 500 MG tablet    Sig: Take 1 tablet (500 mg total) by mouth 2 (two) times daily.    Dispense:  20 tablet    Refill:  0     Follow-up: Return in about 3 months (around 03/15/2016) for a follow-up visit.  Walker Kehr, MD

## 2016-03-08 ENCOUNTER — Encounter: Payer: Self-pay | Admitting: Internal Medicine

## 2016-05-19 ENCOUNTER — Encounter: Payer: Self-pay | Admitting: Family Medicine

## 2016-05-19 ENCOUNTER — Ambulatory Visit (INDEPENDENT_AMBULATORY_CARE_PROVIDER_SITE_OTHER): Payer: PPO | Admitting: Family Medicine

## 2016-05-19 VITALS — BP 138/70 | HR 72 | Temp 98.0°F | Resp 18 | Ht 63.0 in | Wt 199.8 lb

## 2016-05-19 DIAGNOSIS — H9201 Otalgia, right ear: Secondary | ICD-10-CM | POA: Diagnosis not present

## 2016-05-19 NOTE — Progress Notes (Signed)
Pre-visit discussion using our clinic review tool. No additional management support is needed unless otherwise documented below in the visit note.  

## 2016-05-19 NOTE — Patient Instructions (Addendum)
You need to schedule follow up with Dr. Alain Marion ASAP to follow up on your diabetes  We will call you within a week about your referral to ear, nose and throat doctors. If you do not hear within 2 weeks, give [place] a call.

## 2016-05-19 NOTE — Progress Notes (Signed)
Subjective:  Rachel [name] is a 75 y.o. year old very pleasant female patient who presents for/with See problem oriented charting ROS- see any ROS included in HPI as well.   Past Medical History-  Patient Active Problem List   Diagnosis Date Noted  . Well adult exam 12/14/2015  . Acute UTI 12/14/2015  . 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 Bacterial pharyngitis 08/25/2014  . Acute bronchitis 07/03/2014  . Oral thrush 07/03/2014  . Onychomycosis 02/18/2013  . URI (upper respiratory infection) 07/11/2012  . Cough 07/11/2012  . Ganglion cyst 06/25/2011  . Foot pain, left 06/25/2011  . Otitis media 06/25/2011  . LIPOMA 11/03/2010  . EUSTACHIAN TUBE DYSFUNCTION, RIGHT 02/18/2010  . Asthma with acute exacerbation 11/02/2009  . ONYCHOMYCOSIS, TOENAILS 05/18/2009  . DM2 (diabetes mellitus, type 2) ([place]) 04/19/2007  . 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 Essential hypertension 04/19/2007  . ALLERGIC RHINITIS 04/19/2007  . OSTEOARTHRITIS 04/19/2007    Medications- reviewed and updated Current Outpatient Prescriptions  Medication Sig Dispense Refill  . albuterol (PROAIR HFA) 108 (90 BASE) MCG/ACT inhaler INHALE 2 PUFFS INTO THE LUNGS EVERY 6 (SIX) HOURS AS NEEDED FOR WHEEZING. 25.5 each 0  . aspirin 325 MG EC tablet Take 325 mg by mouth daily.    . fluticasone (FLONASE) 50 MCG/ACT nasal spray Place 2 sprays into both nostrils daily. 16 g 6  . glimepiride (AMARYL) 1 MG tablet Take 1 tablet (1 mg total) by mouth daily with breakfast. 30 tablet 11  . ibuprofen (ADVIL,MOTRIN) 600 MG tablet Take 1 tablet (600 mg total) by mouth 2 (two) times daily as needed. 60 tablet 2  . loratadine (CLARITIN) 10 MG tablet Take 1 tablet (10 mg total) by mouth daily. 30 tablet 11  . losartan-hydrochlorothiazide (HYZAAR) 100-25 MG tablet TAKE 1 TABLET BY MOUTH DAILY. 30 tablet 11  . meloxicam (MOBIC) 15 MG tablet Take 1 tablet (15 mg total) by mouth daily as needed for pain. 30 tablet 2   No current  facility-administered medications for this visit.     Objective: BP 138/70   Pulse 72   Temp 98 F (36.7 C) (Oral)   Resp 18   Ht 5\' 3"  (1.6 m)   Wt 199 lb 12 oz (90.6 kg)   SpO2 98%   BMI 35.38 kg/m  Gen: NAD, resting comfortably TM normal bilaterally. No pain with jaw motion- no clicking or popping. Oropharynx normal. Nares normal. No pain with palpation of neck or neck movement- no pain with palpation over mastoid. No pain with movement of ear or pressing on tragus CV: RRR no murmurs rubs or gallops Lungs: CTAB no crackles, wheeze, rhonchi Abdomen: soft/nontender/nondistended/normal bowel sounds. No rebound or guarding.  Ext: no edema Skin: warm, dry Neuro: grossly normal, moves all extremities  Assessment/Plan:   Right ear pain S: 2-3 nights of earache on right side. Minor nose bleed. Last night ached into neck some and slight headache. Rated pain as severe- tried to put hand over ear to help. Mild dry cough but no other URI symptoms ROS- no fever, chills. Mild dry cough- doing some workin her house though. No chest pain or shortness of breath. No clicking or popping of jaw or pain with jaw movement. Denies neck pain or worsening pain with turning neck near her ear. No hearing loss. No blurry vision.  A/P: Examination is unrevealing. Unclear etiology- we will get ENT consult this week for their opinion. Does also not appear to be cervical in origin on exam or history  or related to TMJD.   Diabetes mellitus Lab Results  Component Value Date   HGBA1C 12.6 (H) 01/08/2014  reviewed chart and noted last set of labs do not appear to have been completed. I very strongly encouraged patient to schedule prompt follow up with Dr. Alain Marion for repeat evaluation.   Orders Placed This Encounter  Procedures  . Ambulatory referral to ENT    Referral Priority:   Routine    Referral Type:   Consultation    Referral Reason:   Specialty Services Required    Requested Specialty:    Otolaryngology    Number of Visits Requested:   1   Return precautions advised.  Garret Reddish, MD

## 2016-06-30 ENCOUNTER — Encounter: Payer: Self-pay | Admitting: Family Medicine

## 2016-06-30 ENCOUNTER — Ambulatory Visit (INDEPENDENT_AMBULATORY_CARE_PROVIDER_SITE_OTHER): Payer: PPO | Admitting: Family Medicine

## 2016-06-30 VITALS — BP 140/84 | HR 75 | Temp 97.6°F | Resp 18 | Ht 63.0 in | Wt 198.0 lb

## 2016-06-30 DIAGNOSIS — J4521 Mild intermittent asthma with (acute) exacerbation: Secondary | ICD-10-CM | POA: Diagnosis not present

## 2016-06-30 DIAGNOSIS — R059 Cough, unspecified: Secondary | ICD-10-CM

## 2016-06-30 DIAGNOSIS — R040 Epistaxis: Secondary | ICD-10-CM | POA: Diagnosis not present

## 2016-06-30 DIAGNOSIS — J302 Other seasonal allergic rhinitis: Secondary | ICD-10-CM

## 2016-06-30 DIAGNOSIS — R05 Cough: Secondary | ICD-10-CM

## 2016-06-30 MED ORDER — IPRATROPIUM-ALBUTEROL 0.5-2.5 (3) MG/3ML IN SOLN
3.0000 mL | Freq: Once | RESPIRATORY_TRACT | Status: AC
  Administered 2016-06-30: 3 mL via RESPIRATORY_TRACT

## 2016-06-30 MED ORDER — PREDNISONE 20 MG PO TABS: 20.0000 mg | ORAL_TABLET | Freq: Every day | ORAL | 0 refills | Status: AC

## 2016-06-30 MED ORDER — MECLIZINE HCL 25 MG PO TABS: 25.0000 mg | ORAL_TABLET | Freq: Three times a day (TID) | ORAL | 0 refills | Status: DC | PRN

## 2016-06-30 MED ORDER — BENZONATATE 100 MG PO CAPS: 200.0000 mg | ORAL_CAPSULE | Freq: Two times a day (BID) | ORAL | 0 refills | Status: AC | PRN

## 2016-06-30 NOTE — Patient Instructions (Signed)
A few things to remember from today's visit:   Mild intermittent asthma with acute exacerbation - Plan: ipratropium-albuterol (DUONEB) 0.5-2.5 (3) MG/3ML nebulizer solution 3 mL  Seasonal allergic rhinitis, unspecified chronicity, unspecified trigger  Cough  Epistaxis  Flonase can aggravate/cause nose bleed.  Nasal saline. Vaseline small amount on nasal mucosa may help.  Albuterol inh 4 times daily for 7 days. Plain Mucinex. Monitor for fever.  Adequate hydration.  Prednisone with breakfast.  Monitor blood sugars. Follow in 5-7 days    Please be sure medication list is accurate. If a new problem present, please set up appointment sooner than planned today.

## 2016-06-30 NOTE — Progress Notes (Signed)
HPI:  ACUTE VISIT:  Chief Complaint  Patient presents with  . Cough    congestion, seasonal ashtma concerns, chest tightness and nosebleed     [name] is a 75 y.o. female, who is here today complaining of 5-6 days of respiratory symptoms.  Productive cough with yellowish sputum, denies hemoptysis.  No fever, chill, or myalgias. + nasal congestion, rhinorrhea, and post nasal drainage.  + bilateral chest tightness at rest, no exacerbated by exertion, intermittent dyspnea ("little"), and wheezing. She denies diaphoresis.  Symptoms seem seems to be worse at night when she is lying down. She denies orthopnea or PND.   No Hx of recent travel. No sick contact. No known insect bite. + Hx of allergies: asthma, she is on Albuterol inh, last time she used it about 5 days ago. Denies tobacco use. + Allergic rhinitis, she uses Flonase nasal spray as needed, not lately; Claritin also.  Yesterday she had an episode of nose bleed, stopped after a few minutes. Denies easy bruising or gun bleeding, no blood in stool or gross hematuria.   OTC medications for this problem: None Symptoms otherwise better today.  Hx of DM II, she is not checking BS's.      Review of Systems  Constitutional: Negative for activity change, appetite change, fatigue and fever.  HENT: Positive for congestion, nosebleeds, postnasal drip, rhinorrhea and sneezing. Negative for ear pain, facial swelling, mouth sores, sinus pressure, sore throat, trouble swallowing and voice change.   Eyes: Negative for discharge, redness and itching.  Respiratory: Positive for cough, chest tightness, shortness of breath and wheezing.   Cardiovascular: Negative for palpitations and leg swelling.  Gastrointestinal: Negative for abdominal pain, diarrhea, nausea and vomiting.  Musculoskeletal: Negative for back pain, joint swelling, myalgias and neck pain.  Skin: Negative for color change and rash.    Allergic/Immunologic: Positive for environmental allergies.  Neurological: Negative for syncope, weakness, numbness and headaches.  Hematological: Negative for adenopathy. Does not bruise/bleed easily.  Psychiatric/Behavioral: Negative for confusion. The patient is nervous/anxious.       Current Outpatient Prescriptions on File Prior to Visit  Medication Sig Dispense Refill  . albuterol (PROAIR HFA) 108 (90 BASE) MCG/ACT inhaler INHALE 2 PUFFS INTO THE LUNGS EVERY 6 (SIX) HOURS AS NEEDED FOR WHEEZING. 25.5 each 0  . aspirin 325 MG EC tablet Take 325 mg by mouth daily.    . fluticasone (FLONASE) 50 MCG/ACT nasal spray Place 2 sprays into both nostrils daily. 16 g 6  . glimepiride (AMARYL) 1 MG tablet Take 1 tablet (1 mg total) by mouth daily with breakfast. 30 tablet 11  . ibuprofen (ADVIL,MOTRIN) 600 MG tablet Take 1 tablet (600 mg total) by mouth 2 (two) times daily as needed. 60 tablet 2  . loratadine (CLARITIN) 10 MG tablet Take 1 tablet (10 mg total) by mouth daily. 30 tablet 11  . losartan-hydrochlorothiazide (HYZAAR) 100-25 MG tablet TAKE 1 TABLET BY MOUTH DAILY. 30 tablet 11  . meloxicam (MOBIC) 15 MG tablet Take 1 tablet (15 mg total) by mouth daily as needed for pain. (Patient not taking: Reported on 06/30/2016) 30 tablet 2   No current facility-administered medications on file prior to visit.      Past Medical History:  Diagnosis Date  . Allergic rhinitis   . Diabetes mellitus, type 2 ([place])   . Hyperlipemia   . Hypertension   . Lipoma NEC    anterior upper chest  . Osteoarthritis of knee  Allergies  Allergen Reactions  . Aspirin Nausea And Vomiting    Uncoated aspirin Jittery GI upset  . Codeine Sulfate Nausea And Vomiting    Heart races.  . Glimepiride Diarrhea  . Metformin Diarrhea  . Propoxyphene N-Acetaminophen Nausea And Vomiting    Social History   Social History  . Marital status: Married    Spouse name: N/A  . Number of children: N/A  . Years  of education: N/A   Occupational History  . department mang [employer]   Social History Main Topics  . Smoking status: Never Smoker  . Smokeless tobacco: Never Used  . Alcohol use No  . Drug use: No  . Sexual activity: Yes   Other Topics Concern  . None   Social History Narrative   No regular exercise   Widowed    Vitals:   06/30/16 1016  BP: 140/84  Pulse: 75  Resp: 18  Temp: 97.6 F (36.4 C)    O2 at RA 98%   Body mass index is 35.07 kg/m.      Physical Exam  Nursing note and vitals reviewed. Constitutional: She is oriented to person, place, and time. She appears well-developed. She does not appear ill. No distress.  HENT:  Head: Atraumatic.  Nose: Rhinorrhea present. No nasal septal hematoma. No epistaxis. Right sinus exhibits no maxillary sinus tenderness and no frontal sinus tenderness. Left sinus exhibits no maxillary sinus tenderness and no frontal sinus tenderness.  Mouth/Throat: Uvula is midline, oropharynx is clear and moist and mucous membranes are normal.  Clearing throat frequently. Post nasal drainage.  Eyes: Conjunctivae are normal.  Neck: No muscular tenderness present. No edema and no erythema present.  Cardiovascular: Normal rate and regular rhythm.   No murmur heard. Respiratory: Effort normal and breath sounds normal. No stridor. No respiratory distress.  Coughing a few times during visit.  Lymphadenopathy:       Head (right side): No submandibular adenopathy present.       Head (left side): No submandibular adenopathy present.    She has no cervical adenopathy.  Neurological: She is alert and oriented to person, place, and time. She has normal strength. Coordination normal.  Skin: Skin is warm. No rash noted. No erythema.  Psychiatric: Her speech is normal. Her mood appears anxious.  Well groomed, good eye contact.      ASSESSMENT AND PLAN:    Rachel [name] was seen today for cough.  Diagnoses and all orders for this  visit:  Mild intermittent asthma with acute exacerbation  Today I did not hear wheezing or any abnormal breath sound, before or after Duoneb neb treatment. Reporting improvement with breathing treatment. Prednisone 20 mg daily x 5 days recommended. Side effects of steroid and other medications discussed, monitor BS closely. Instructed about warning signs and f/u in 5-7 days, before if needed.   -     ipratropium-albuterol (DUONEB) 0.5-2.5 (3) MG/3ML nebulizer solution 3 mL; Take 3 mLs by nebulization once. -     predniSONE (DELTASONE) 20 MG tablet; Take 1 tablet (20 mg total) by mouth daily with breakfast.  Seasonal allergic rhinitis, unspecified chronicity, unspecified trigger  Could be contributing/aggravating symptoms. Resume Flonase nasal spray and Claritin. Nasal saline. Avoid cold meds or decongestants.   Cough  She is requesting "cough syrup", Benzonatate may help.Explained cough is an important defense mechanism, may last a few days or weeks. Imaging is not available today but I do not think it is needed for now.  Adequate  hydration and plain Mucinex.  -     benzonatate (TESSALON) 100 MG capsule; Take 2 capsules (200 mg total) by mouth 2 (two) times daily as needed for cough.   Epistaxis  Resolved. Possible causes discussed: allergies, dry nose among some. Flonase and antihistaminics may aggravate problem. Vaseline or [place] , small amount on nasal mucosa may help. Nasal saline as needed. Instructed about warning signs.      Return in about 1 week (around 07/07/2016) for PCP asthma.     -[name] was advised to return or notify a doctor immediately if symptoms worsen or new concerns arise, otherwise she will follow in 5-7 days. She voices understanding. Daughter came to room later during visit , plan and instructions also discussed with her.       Jaloni Davoli G. [place], MD  [facility] [place] office.

## 2016-06-30 NOTE — Progress Notes (Signed)
Pre visit review using our clinic review tool, if applicable. No additional management support is needed unless otherwise documented below in the visit note. 

## 2016-07-05 ENCOUNTER — Telehealth: Payer: Self-pay | Admitting: Internal Medicine

## 2016-07-05 NOTE — Telephone Encounter (Signed)
Patient Name: Rachel [name]  DOB: [birth year]    Initial Comment Caller states was seen in the office Saturday for asthma. Was given a breathing treatment and some other prescriptions. Still not feeling any better. Has an appointment on Monday, but doesn't think she can make it through the weekend. Wanting to know if they will refill the prescriptions for her. Real bad cough and not sleeping, tightness in the chest.    Nurse Assessment  Nurse: Adella Nissen, RN, Leitha Schuller Date/Time ([place] Time): 07/05/2016 3:53:07 PM  Confirm and document reason for call. If symptomatic, describe symptoms. You must click the next button to save text entered. ---Caller states she does fell some better, somewhat short winded, has steroids completed, running low on cough medicine, no fever, just a bit of wheezing at night, talking in full sentences  Has the patient traveled out of the country within the last 30 days? ---No  Does the patient have any new or worsening symptoms? ---Yes  Will a triage be completed? ---Yes  Related visit to physician within the last 2 weeks? ---Yes  Does the PT have any chronic conditions? (i.e. diabetes, asthma, etc.) ---Yes  List chronic conditions. ---seasonal asthma, diabetes type 2, hypertension  Is this a behavioral health or substance abuse call? ---No     Guidelines    Guideline Title Affirmed Question Affirmed Notes  Asthma Attack [1] MILD asthma attack (e.g., no SOB at rest, mild SOB with walking, speaks normally in sentences, mild wheezing) AND [2] persists > 24 hours on appropriate treatment    Final Disposition User   See Physician within [place], RN, Leitha Schuller    Comments  Caller informed that can take second inhaler treatment 20 minutes after her regular dose if still tight in chest and coughing.  Spoke with back office line, please have Evlyn Kanner NP call the patient or other representative of office for follow up appointment, patient willing to go elsewhere  for re-check if not able to be seen in her home office   Referrals  REFERRED TO PCP OFFICE   Disagree/Comply: Comply

## 2016-07-09 ENCOUNTER — Encounter: Payer: Self-pay | Admitting: Nurse Practitioner

## 2016-07-09 ENCOUNTER — Ambulatory Visit (INDEPENDENT_AMBULATORY_CARE_PROVIDER_SITE_OTHER): Payer: PPO | Admitting: Nurse Practitioner

## 2016-07-09 ENCOUNTER — Ambulatory Visit: Payer: PPO | Admitting: Nurse Practitioner

## 2016-07-09 ENCOUNTER — Ambulatory Visit (INDEPENDENT_AMBULATORY_CARE_PROVIDER_SITE_OTHER)
Admission: RE | Admit: 2016-07-09 | Discharge: 2016-07-09 | Disposition: A | Discharge status: Home / Self Care | Payer: PPO | Source: Ambulatory Visit | Attending: Nurse Practitioner | Admitting: Nurse Practitioner

## 2016-07-09 VITALS — BP 142/84 | HR 88 | Temp 97.6°F | Ht 63.0 in | Wt 201.0 lb

## 2016-07-09 DIAGNOSIS — J4531 Mild persistent asthma with (acute) exacerbation: Secondary | ICD-10-CM | POA: Diagnosis not present

## 2016-07-09 DIAGNOSIS — J302 Other seasonal allergic rhinitis: Secondary | ICD-10-CM

## 2016-07-09 DIAGNOSIS — R05 Cough: Secondary | ICD-10-CM | POA: Diagnosis not present

## 2016-07-09 MED ORDER — MONTELUKAST SODIUM 10 MG PO TABS: 10.0000 mg | ORAL_TABLET | Freq: Every day | ORAL | 3 refills | Status: DC

## 2016-07-09 MED ORDER — HYDROCODONE-HOMATROPINE 5-1.5 MG/5ML PO SYRP: 5.0000 mL | ORAL_SOLUTION | Freq: Every evening | ORAL | 0 refills | Status: DC | PRN

## 2016-07-09 MED ORDER — ALBUTEROL SULFATE HFA 108 (90 BASE) MCG/ACT IN AERS: 1.0000 | INHALATION_SPRAY | Freq: Four times a day (QID) | RESPIRATORY_TRACT | 1 refills | Status: DC | PRN

## 2016-07-09 NOTE — Patient Instructions (Signed)
Use albuterol and flonase as prescribed. May also use robitussin DM as needed for cough during the day. Encourage adequate oral hydration.  Go to basement for CXR. Will call with CXR results.

## 2016-07-09 NOTE — Progress Notes (Signed)
Subjective:  Patient ID: Rachel [name], female    DOB: September 26, 1940  Age: 76 y.o. MRN: [id number]  CC: Cough (asthma, coughing, not sleeping and tightness in chest)   Cough  This is a new problem. The current episode started in the past 7 days. The problem has been gradually improving. The problem occurs constantly. The cough is non-productive. Associated symptoms include nasal congestion, postnasal drip, rhinorrhea, shortness of breath and wheezing. Pertinent negatives include no chest pain, chills, ear congestion, ear pain, fever, headaches, heartburn, hemoptysis, myalgias, sore throat, sweats or weight loss. She has tried a beta-agonist inhaler and oral steroids for the symptoms. The treatment provided mild relief. Her past medical history is significant for asthma and environmental allergies.    Outpatient Medications Prior to Visit  Medication Sig Dispense Refill  . aspirin 325 MG EC tablet Take 325 mg by mouth daily.    . fluticasone (FLONASE) 50 MCG/ACT nasal spray Place 2 sprays into both nostrils daily. 16 g 6  . glimepiride (AMARYL) 1 MG tablet Take 1 tablet (1 mg total) by mouth daily with breakfast. 30 tablet 11  . ibuprofen (ADVIL,MOTRIN) 600 MG tablet Take 1 tablet (600 mg total) by mouth 2 (two) times daily as needed. 60 tablet 2  . loratadine (CLARITIN) 10 MG tablet Take 1 tablet (10 mg total) by mouth daily. 30 tablet 11  . losartan-hydrochlorothiazide (HYZAAR) 100-25 MG tablet TAKE 1 TABLET BY MOUTH DAILY. 30 tablet 11  . meloxicam (MOBIC) 15 MG tablet Take 1 tablet (15 mg total) by mouth daily as needed for pain. 30 tablet 2  . albuterol (PROAIR HFA) 108 (90 BASE) MCG/ACT inhaler INHALE 2 PUFFS INTO THE LUNGS EVERY 6 (SIX) HOURS AS NEEDED FOR WHEEZING. 25.5 each 0   No facility-administered medications prior to visit.     ROS See HPI  Objective:  BP (!) 142/84 (BP Location: Left Arm, Patient Position: Sitting, Cuff Size: Normal)   Pulse 88   Temp 97.6 F (36.4  C)   Ht 5\' 3"  (1.6 m)   Wt 201 lb (91.2 kg)   SpO2 98%   BMI 35.61 kg/m   BP Readings from Last 3 Encounters:  07/09/16 (!) 142/84  06/30/16 140/84  05/19/16 138/70    Wt Readings from Last 3 Encounters:  07/09/16 201 lb (91.2 kg)  06/30/16 198 lb (89.8 kg)  05/19/16 199 lb 12 oz (90.6 kg)    Physical Exam  Constitutional: She is oriented to person, place, and time.  HENT:  Right Ear: External ear and ear canal normal. Tympanic membrane is not erythematous. A middle ear effusion is present.  Left Ear: External ear and ear canal normal. Tympanic membrane is not erythematous. A middle ear effusion is present.  Nose: Mucosal edema and rhinorrhea present. Right sinus exhibits no maxillary sinus tenderness and no frontal sinus tenderness. Left sinus exhibits no maxillary sinus tenderness and no frontal sinus tenderness.  Mouth/Throat: Uvula is midline. Posterior oropharyngeal erythema present. No oropharyngeal exudate.  Eyes: No scleral icterus.  Neck: Normal range of motion. Neck supple.  Cardiovascular: Normal rate, regular rhythm and normal heart sounds.   Pulmonary/Chest: Effort normal and breath sounds normal.  Musculoskeletal: She exhibits no edema.  Lymphadenopathy:    She has no cervical adenopathy.  Neurological: She is alert and oriented to person, place, and time.  Skin: Skin is warm and dry.  Psychiatric: She has a normal mood and affect. Her behavior is normal.  Vitals reviewed.  Lab Results  Component Value Date   WBC 8.2 03/14/2011   HGB 12.3 03/14/2011   HCT 36.3 03/14/2011   PLT 312.0 03/14/2011   GLUCOSE 262 (H) 12/12/2015   CHOL 212 (H) 12/12/2015   TRIG 146.0 12/12/2015   HDL 47.10 12/12/2015   LDLDIRECT 150.2 03/14/2011   LDLCALC 135 (H) 12/12/2015   ALT 14 12/12/2015   AST 15 12/12/2015   NA 133 (L) 12/12/2015   K 4.2 12/12/2015   CL 99 12/12/2015   CREATININE 0.70 12/12/2015   BUN 16 12/12/2015   CO2 26 12/12/2015   TSH 2.58 12/12/2015    HGBA1C 12.6 (H) 01/08/2014   MICROALBUR 1.8 12/12/2015    Dg Tmj Open & Close Bilateral  Result Date: 09/15/2015 CLINICAL DATA:  Right ear and jaw pain, initial encounter EXAM: TEMPOROMANDIBULAR JOINTS COMPARISON:  None. FINDINGS: No acute fracture or dislocation is noted. The temporomandibular joints are within normal limits. No significant subluxation is noted IMPRESSION: No acute abnormality noted. Electronically Signed   By: Inez Catalina M.D.   On: 09/15/2015 13:54    Assessment & Plan:   Nikira was seen today for cough.  Diagnoses and all orders for this visit:  Mild persistent asthma with acute exacerbation -     albuterol (PROAIR HFA) 108 (90 Base) MCG/ACT inhaler; Inhale 1-2 puffs into the lungs every 6 (six) hours as needed for wheezing or shortness of breath. -     DG Chest 2 View; Future -     montelukast (SINGULAIR) 10 MG tablet; Take 1 tablet (10 mg total) by mouth at bedtime. -     HYDROcodone-homatropine (HYCODAN) 5-1.5 MG/5ML syrup; Take 5 mLs by mouth at bedtime as needed for cough.  Seasonal allergic rhinitis, unspecified chronicity, unspecified trigger -     montelukast (SINGULAIR) 10 MG tablet; Take 1 tablet (10 mg total) by mouth at bedtime.   I have changed Ms. Audia's albuterol. I am also having her start on montelukast and HYDROcodone-homatropine. Additionally, I am having her maintain her aspirin, loratadine, fluticasone, meloxicam, losartan-hydrochlorothiazide, ibuprofen, and glimepiride.  Meds ordered this encounter  Medications  . albuterol (PROAIR HFA) 108 (90 Base) MCG/ACT inhaler    Sig: Inhale 1-2 puffs into the lungs every 6 (six) hours as needed for wheezing or shortness of breath.    Dispense:  1 Inhaler    Refill:  1    Order Specific Question:   Supervising Provider    Answer:   Cassandria Anger [[id number]]  . montelukast (SINGULAIR) 10 MG tablet    Sig: Take 1 tablet (10 mg total) by mouth at bedtime.    Dispense:  30 tablet    Refill:   3    Order Specific Question:   Supervising Provider    Answer:   Cassandria Anger [[id number]]  . HYDROcodone-homatropine (HYCODAN) 5-1.5 MG/5ML syrup    Sig: Take 5 mLs by mouth at bedtime as needed for cough.    Dispense:  120 mL    Refill:  0    Order Specific Question:   Supervising Provider    Answer:   Cassandria Anger [[id number]]   Normal chest x-ray  Follow-up: Return if symptoms worsen or fail to improve.  Wilfred Lacy, NP

## 2016-07-09 NOTE — Progress Notes (Signed)
Pre visit review using our clinic review tool, if applicable. No additional management support is needed unless otherwise documented below in the visit note. 

## 2016-08-03 DIAGNOSIS — R0600 Dyspnea, unspecified: Secondary | ICD-10-CM | POA: Diagnosis not present

## 2016-08-03 DIAGNOSIS — J45901 Unspecified asthma with (acute) exacerbation: Secondary | ICD-10-CM | POA: Diagnosis not present

## 2016-08-03 DIAGNOSIS — R062 Wheezing: Secondary | ICD-10-CM | POA: Diagnosis not present

## 2016-08-03 DIAGNOSIS — J80 Acute respiratory distress syndrome: Secondary | ICD-10-CM | POA: Diagnosis not present

## 2016-08-03 DIAGNOSIS — E1100 Type 2 diabetes mellitus with hyperosmolarity without nonketotic hyperglycemic-hyperosmolar coma (NKHHC): Secondary | ICD-10-CM | POA: Diagnosis not present

## 2016-11-22 ENCOUNTER — Other Ambulatory Visit: Payer: Self-pay | Admitting: *Deleted

## 2016-11-22 MED ORDER — IBUPROFEN 600 MG PO TABS: 600.0000 mg | ORAL_TABLET | Freq: Two times a day (BID) | ORAL | 0 refills | Status: DC | PRN

## 2017-01-20 ENCOUNTER — Other Ambulatory Visit: Payer: Self-pay | Admitting: Family Medicine

## 2017-01-20 DIAGNOSIS — R059 Cough, unspecified: Secondary | ICD-10-CM

## 2017-01-20 DIAGNOSIS — R05 Cough: Secondary | ICD-10-CM

## 2017-02-19 ENCOUNTER — Ambulatory Visit (INDEPENDENT_AMBULATORY_CARE_PROVIDER_SITE_OTHER): Payer: PPO | Admitting: Nurse Practitioner

## 2017-02-19 ENCOUNTER — Other Ambulatory Visit (INDEPENDENT_AMBULATORY_CARE_PROVIDER_SITE_OTHER): Payer: PPO

## 2017-02-19 ENCOUNTER — Encounter: Payer: Self-pay | Admitting: Nurse Practitioner

## 2017-02-19 VITALS — BP 230/80 | HR 71 | Temp 97.7°F | Ht 63.0 in | Wt 199.0 lb

## 2017-02-19 DIAGNOSIS — L97509 Non-pressure chronic ulcer of other part of unspecified foot with unspecified severity: Secondary | ICD-10-CM | POA: Diagnosis not present

## 2017-02-19 DIAGNOSIS — E11621 Type 2 diabetes mellitus with foot ulcer: Secondary | ICD-10-CM | POA: Diagnosis not present

## 2017-02-19 DIAGNOSIS — L03032 Cellulitis of left toe: Secondary | ICD-10-CM

## 2017-02-19 DIAGNOSIS — H6981 Other specified disorders of Eustachian tube, right ear: Secondary | ICD-10-CM | POA: Diagnosis not present

## 2017-02-19 DIAGNOSIS — I169 Hypertensive crisis, unspecified: Secondary | ICD-10-CM | POA: Diagnosis not present

## 2017-02-19 DIAGNOSIS — I10 Essential (primary) hypertension: Secondary | ICD-10-CM

## 2017-02-19 DIAGNOSIS — E1140 Type 2 diabetes mellitus with diabetic neuropathy, unspecified: Secondary | ICD-10-CM

## 2017-02-19 DIAGNOSIS — Z794 Long term (current) use of insulin: Principal | ICD-10-CM

## 2017-02-19 DIAGNOSIS — S91111A Laceration without foreign body of right great toe without damage to nail, initial encounter: Secondary | ICD-10-CM | POA: Diagnosis not present

## 2017-02-19 LAB — HEMOGLOBIN A1C: Hgb A1c MFr Bld: 12.4 % — ABNORMAL HIGH (ref 4.6–6.5)

## 2017-02-19 MED ORDER — FLUTICASONE PROPIONATE 50 MCG/ACT NA SUSP
2.0000 | Freq: Every day | NASAL | 1 refills | Status: DC
Start: 2017-02-19 — End: 2018-06-03

## 2017-02-19 MED ORDER — BLOOD GLUCOSE MONITOR KIT: PACK | 1 refills | Status: DC

## 2017-02-19 MED ORDER — MUPIROCIN 2 % EX OINT: 1.0000 "application " | TOPICAL_OINTMENT | Freq: Two times a day (BID) | CUTANEOUS | 0 refills | Status: DC

## 2017-02-19 MED ORDER — CLONIDINE HCL 0.1 MG PO TABS
0.2000 mg | ORAL_TABLET | Freq: Once | ORAL | Status: AC
  Administered 2017-02-19: 0.2 mg via ORAL

## 2017-02-19 MED ORDER — DOXYCYCLINE HYCLATE 100 MG PO TABS: 100.0000 mg | ORAL_TABLET | Freq: Two times a day (BID) | ORAL | 0 refills | Status: DC

## 2017-02-19 MED ORDER — LOSARTAN POTASSIUM-HCTZ 100-25 MG PO TABS: ORAL_TABLET | ORAL | 1 refills | Status: DC

## 2017-02-19 NOTE — Patient Instructions (Addendum)
Start BP medication today.  Managing Your Hypertension Hypertension is commonly called high blood pressure. This is when the force of your blood pressing against the walls of your arteries is too strong. Arteries are blood vessels that carry blood from your heart throughout your body. Hypertension forces the heart to work harder to pump blood, and may cause the arteries to become narrow or stiff. Having untreated or uncontrolled hypertension can cause heart attack, stroke, kidney disease, and other problems. What are blood pressure readings? A blood pressure reading consists of a higher number over a lower number. Ideally, your blood pressure should be below 120/80. The first ("top") number is called the systolic pressure. It is a measure of the pressure in your arteries as your heart beats. The second ("bottom") number is called the diastolic pressure. It is a measure of the pressure in your arteries as the heart relaxes. What does my blood pressure reading mean? Blood pressure is classified into four stages. Based on your blood pressure reading, your health care provider may use the following stages to determine what type of treatment you need, if any. Systolic pressure and diastolic pressure are measured in a unit called mm Hg. Normal   Systolic pressure: below 062.  Diastolic pressure: below 80. Elevated   Systolic pressure: 376-283.  Diastolic pressure: below 80. Hypertension stage 1     Diastolic pressure: 15-17. Hypertension stage 2   Systolic pressure: 616 or above.  Diastolic pressure: 90 or above. What health risks are associated with hypertension? Managing your hypertension is an important responsibility. Uncontrolled hypertension can lead to:  A heart attack.  A stroke.  A weakened blood vessel (aneurysm).  Heart failure.  Kidney damage.  Eye damage.  Metabolic syndrome.  Memory and concentration problems. What changes can I make to manage my  hypertension? Eating and drinking   Eat a diet that is high in fiber and potassium, and low in salt (sodium), added sugar, and fat. An example eating plan is called the DASH (Dietary Approaches to Stop Hypertension) diet. To eat this way:  Eat plenty of fresh fruits and vegetables. Try to fill half of your plate at each meal with fruits and vegetables.  Eat whole grains, such as whole wheat pasta, brown rice, or whole grain bread. Fill about one quarter of your plate with whole grains.  Eat low-fat diary products.  Avoid fatty cuts of meat, processed or cured meats, and poultry with skin. Fill about one quarter of your plate with lean proteins such as fish, chicken without skin, beans, eggs, and tofu.  Avoid premade and processed foods. These tend to be higher in sodium, added sugar, and fat.     Lifestyle   Work with your health care provider to maintain a healthy body weight, or to lose weight. Ask what an ideal weight is for you.  Get at least 30 minutes of exercise that causes your heart to beat faster (aerobic exercise) most days of the week. Activities may include walking, swimming, or biking.       Monitoring   Monitor your blood pressure at home as told by your health care provider. Your personal target blood pressure may vary depending on your medical conditions, your age, and other factors.  Have your blood pressure checked regularly, as often as told by your health care provider. Working with your health care provider   Review all the medicines you take with your health care provider because there may be side effects or interactions.  Talk with your health care provider about your diet, exercise habits, and other lifestyle factors that may be contributing to hypertension.  Visit your health care provider regularly. Your health care provider can help you create and adjust your plan for managing hypertension. Will I need medicine to control my blood pressure? Your  health care provider may prescribe medicine if lifestyle changes are not enough to get your blood pressure under control, and if:  Your systolic blood pressure is 130 or higher.  Your diastolic blood pressure is 80 or higher. Take medicines only as told by your health care provider. Follow the directions carefully. Blood pressure medicines must be taken as prescribed. The medicine does not work as well when you skip doses. Skipping doses also puts you at risk for problems. Contact a health care provider if:  You think you are having a reaction to medicines you have taken.  You have repeated (recurrent) headaches.  You feel dizzy.  You have swelling in your ankles.  You have trouble with your vision. Get help right away if:  You develop a severe headache or confusion.  You have unusual weakness or numbness, or you feel faint.  You have severe pain in your chest or abdomen.  You vomit repeatedly.  You have trouble breathing. Summary  Hypertension is when the force of blood pumping through your arteries is too strong. If this condition is not controlled, it may put you at risk for serious complications.  Your personal target blood pressure may vary depending on your medical conditions, your age, and other factors. For most people, a normal blood pressure is less than 120/80.  Hypertension is managed by lifestyle changes, medicines, or both. Lifestyle changes include weight loss, eating a healthy, low-sodium diet, exercising more, and limiting alcohol. This information is not intended to replace advice given to you by your health care provider. Make sure you discuss any questions you have with your health care provider. Document Released: 06/04/2012 Document Revised: 08/08/2016 Document Reviewed: 08/08/2016 Elsevier Interactive Patient Education  2017 [employer].    Diabetes and Foot Care Diabetes may cause you to have problems because of poor blood supply (circulation) to  your feet and legs. This may cause the skin on your feet to become thinner, break easier, and heal more slowly. Your skin may become dry, and the skin may peel and crack. You may also have nerve damage in your legs and feet causing decreased feeling in them. You may not notice minor injuries to your feet that could lead to infections or more serious problems. Taking care of your feet is one of the most important things you can do for yourself. Follow these instructions at home:  Wear shoes at all times, even in the house. Do not go barefoot. Bare feet are easily injured.  Check your feet daily for blisters, cuts, and redness. If you cannot see the bottom of your feet, use a mirror or ask someone for help.  Wash your feet with warm water (do not use hot water) and mild soap. Then pat your feet and the areas between your toes until they are completely dry. Do not soak your feet as this can dry your skin.  Apply a moisturizing lotion or petroleum jelly (that does not contain alcohol and is unscented) to the skin on your feet and to dry, brittle toenails. Do not apply lotion between your toes.  Trim your toenails straight across. Do not dig under them or around the cuticle. File  the edges of your nails with an emery board or nail file.  Do not cut corns or calluses or try to remove them with medicine.  Wear clean socks or stockings every day. Make sure they are not too tight. Do not wear knee-high stockings since they may decrease blood flow to your legs.  Wear shoes that fit properly and have enough cushioning. To break in new shoes, wear them for just a few hours a day. This prevents you from injuring your feet. Always look in your shoes before you put them on to be sure there are no objects inside.  Do not cross your legs. This may decrease the blood flow to your feet.  If you find a minor scrape, cut, or break in the skin on your feet, keep it and the skin around it clean and dry. These areas  may be cleansed with mild soap and water. Do not cleanse the area with peroxide, alcohol, or iodine.  When you remove an adhesive bandage, be sure not to damage the skin around it.  If you have a wound, look at it several times a day to make sure it is healing.  Do not use heating pads or hot water bottles. They may burn your skin. If you have lost feeling in your feet or legs, you may not know it is happening until it is too late.  Make sure your health care provider performs a complete foot exam at least annually or more often if you have foot problems. Report any cuts, sores, or bruises to your health care provider immediately. Contact a health care provider if:  You have an injury that is not healing.  You have cuts or breaks in the skin.  You have an ingrown nail.  You notice redness on your legs or feet.  You feel burning or tingling in your legs or feet.  You have pain or cramps in your legs and feet.  Your legs or feet are numb.  Your feet always feel cold. Get help right away if:  There is increasing redness, swelling, or pain in or around a wound.  There is a red line that goes up your leg.  Pus is coming from a wound.  You develop a fever or as directed by your health care provider.  You notice a bad smell coming from an ulcer or wound. This information is not intended to replace advice given to you by your health care provider. Make sure you discuss any questions you have with your health care provider. Document Released: 09/07/2000 Document Revised: 02/16/2016 Document Reviewed: 02/17/2013 Elsevier Interactive Patient Education  2017 [employer].

## 2017-02-19 NOTE — Progress Notes (Signed)
Subjective:  Patient ID: Rachel [name], female    DOB: Dec 19, 1940  Age: 76 y.o. MRN: [id number]  CC: Ear Pain (right ear pain,swelling,right nose stop up,headache/ left toe got cut from nail shop saturday--DM--she is concern)  Otalgia   There is pain in the right ear. This is a recurrent problem. The current episode started more than 1 month ago. The problem occurs constantly. The problem has been waxing and waning. There has been no fever. The pain is severe. Associated symptoms include headaches. Pertinent negatives include no abdominal pain, coughing, diarrhea, ear discharge, hearing loss, neck pain, rash, rhinorrhea, sore throat or vomiting. She has tried nothing for the symptoms.  Wound Check  She was originally treated 3 to 5 days ago (Left Great Toe). Previous treatment included wound cleansing or irrigation. Her temperature was unmeasured prior to arrival. There has been colored discharge from the wound. There is no redness present. There is new swelling present. There is new pain present. She has no difficulty moving the affected extremity or digit.   HTN: Has need out of medication for several weeks.  DM: Has been out of medications for several weeks. Does not check glucose at home.  Outpatient Medications Prior to Visit  Medication Sig Dispense Refill  . albuterol (PROAIR HFA) 108 (90 Base) MCG/ACT inhaler Inhale 1-2 puffs into the lungs every 6 (six) hours as needed for wheezing or shortness of breath. 1 Inhaler 1  . aspirin 325 MG EC tablet Take 325 mg by mouth daily.    [name] glimepiride (AMARYL) 1 MG tablet Take 1 tablet (1 mg total) by mouth daily with breakfast. 30 tablet 11  . loratadine (CLARITIN) 10 MG tablet Take 1 tablet (10 mg total) by mouth daily. 30 tablet 11  . meloxicam (MOBIC) 15 MG tablet Take 1 tablet (15 mg total) by mouth daily as needed for pain. 30 tablet 2  . fluticasone (FLONASE) 50 MCG/ACT nasal spray Place 2 sprays into both nostrils daily. 16 g 6    . HYDROcodone-homatropine (HYCODAN) 5-1.5 MG/5ML syrup Take 5 mLs by mouth at bedtime as needed for cough. 120 mL 0  . ibuprofen (ADVIL,MOTRIN) 600 MG tablet Take 1 tablet (600 mg total) by mouth 2 (two) times daily as needed. Yearly physical w/labs is due must see MD for future refills 60 tablet 0  . losartan-hydrochlorothiazide (HYZAAR) 100-25 MG tablet TAKE 1 TABLET BY MOUTH DAILY. 30 tablet 11  . montelukast (SINGULAIR) 10 MG tablet Take 1 tablet (10 mg total) by mouth at bedtime. (Patient not taking: Reported on 02/19/2017) 30 tablet 3   No facility-administered medications prior to visit.     ROS See HPI  Objective:  BP (!) 230/80   Pulse 71   Temp 97.7 F (36.5 C)   Ht _0  (1.6 m)   Wt 199 lb (90.3 kg)   SpO2 99%   BMI 35.25 kg/m   BP Readings from Last 3 Encounters:  02/19/17 (!) 230/80  07/09/16 (!) 142/84  06/30/16 140/84    Wt Readings from Last 3 Encounters:  02/19/17 199 lb (90.3 kg)  07/09/16 201 lb (91.2 kg)  06/30/16 198 lb (89.8 kg)    Physical Exam  Constitutional: She is oriented to person, place, and time.  Eyes: Conjunctivae and EOM are normal. Pupils are equal, round, and reactive to light.  Neck: Normal range of motion. Neck supple.  Cardiovascular: Normal rate and normal heart sounds.   Pulmonary/Chest: Effort normal and breath sounds normal.  Musculoskeletal: She exhibits edema and tenderness.  Neurological: She is alert and oriented to person, place, and time. No cranial nerve deficit.  Skin: There is erythema.  Vitals reviewed.   Lab Results  Component Value Date   WBC 8.2 03/14/2011   HGB 12.3 03/14/2011   HCT 36.3 03/14/2011   PLT 312.0 03/14/2011   GLUCOSE 262 (H) 12/12/2015   CHOL 212 (H) 12/12/2015   TRIG 146.0 12/12/2015   HDL 47.10 12/12/2015   LDLDIRECT 150.2 03/14/2011   LDLCALC 135 (H) 12/12/2015   ALT 14 12/12/2015   AST 15 12/12/2015   NA 133 (L) 12/12/2015   K 4.2 12/12/2015   CL 99 12/12/2015   CREATININE 0.70  12/12/2015   BUN 16 12/12/2015   CO2 26 12/12/2015   TSH 2.58 12/12/2015   HGBA1C 12.6 (H) 01/08/2014   MICROALBUR 1.8 12/12/2015    Dg Chest 2 View  Result Date: 07/09/2016 CLINICAL DATA:  Patient with cough and congestion for 3 weeks. EXAM: CHEST  2 VIEW COMPARISON:  Chest radiograph 01/04/2011. FINDINGS: Normal cardiac and mediastinal contours. No consolidative pulmonary opacities. No pleural effusion or pneumothorax. Mid thoracic spine degenerative changes. IMPRESSION: No active cardiopulmonary disease. Electronically Signed   By: Lovey Newcomer M.D.   On: 07/09/2016 14:11    Assessment & Plan:   Rachel [name] was seen today for ear pain.  Diagnoses and all orders for this visit:  Dysfunction of right eustachian tube -     fluticasone (FLONASE) 50 MCG/ACT nasal spray; Place 2 sprays into both nostrils daily. -     Ambulatory referral to ENT  Essential hypertension -     Basic metabolic panel; Future -     Hemoglobin A1c; Future -     losartan-hydrochlorothiazide (HYZAAR) 100-25 MG tablet; TAKE 1 TABLET BY MOUTH DAILY. -     cloNIDine (CATAPRES) tablet 0.2 mg; Take 2 tablets (0.2 mg total) by mouth once.  Cellulitis of toe of left foot -     doxycycline (VIBRA-TABS) 100 MG tablet; Take 1 tablet (100 mg total) by mouth 2 (two) times daily. -     mupirocin ointment (BACTROBAN) 2 %; Place 1 application into the nose 2 (two) times daily.  Laceration of right great toe without foreign body present or damage to nail, initial encounter -     doxycycline (VIBRA-TABS) 100 MG tablet; Take 1 tablet (100 mg total) by mouth 2 (two) times daily. -     mupirocin ointment (BACTROBAN) 2 %; Place 1 application into the nose 2 (two) times daily.  Type 2 diabetes mellitus with foot ulcer, without long-term current use of insulin (HCC) -     blood glucose meter kit and supplies KIT; Dispense based on patient and insurance preference. Use before breakfast and at bedtime. ICD10: E11.00 -     Ambulatory  referral to Podiatry  Hypertensive crisis -     cloNIDine (CATAPRES) tablet 0.2 mg; Take 2 tablets (0.2 mg total) by mouth once.   I have discontinued Rachel [name]'s HYDROcodone-homatropine and ibuprofen. I am also having her start on doxycycline, blood glucose meter kit and supplies, and mupirocin ointment. Additionally, I am having her maintain her aspirin, loratadine, meloxicam, glimepiride, albuterol, montelukast, losartan-hydrochlorothiazide, and fluticasone. We will continue to administer cloNIDine.  Meds ordered this encounter  Medications  . doxycycline (VIBRA-TABS) 100 MG tablet    Sig: Take 1 tablet (100 mg total) by mouth 2 (two) times daily.    Dispense:  14 tablet  Refill:  0    Order Specific Question:   Supervising Provider    Answer:   Cassandria Anger [[id number]]  . blood glucose meter kit and supplies KIT    Sig: Dispense based on patient and insurance preference. Use before breakfast and at bedtime. ICD10: E11.00    Dispense:  1 each    Refill:  1    Order Specific Question:   Supervising Provider    Answer:   Cassandria Anger [[id number]]    Order Specific Question:   Number of strips    Answer:   100    Order Specific Question:   Number of lancets    Answer:   100  . losartan-hydrochlorothiazide (HYZAAR) 100-25 MG tablet    Sig: TAKE 1 TABLET BY MOUTH DAILY.    Dispense:  30 tablet    Refill:  1    Order Specific Question:   Supervising Provider    Answer:   Cassandria Anger [[id number]]  . fluticasone (FLONASE) 50 MCG/ACT nasal spray    Sig: Place 2 sprays into both nostrils daily.    Dispense:  16 g    Refill:  1    Order Specific Question:   Supervising Provider    Answer:   Cassandria Anger [[id number]]  . cloNIDine (CATAPRES) tablet 0.2 mg  . mupirocin ointment (BACTROBAN) 2 %    Sig: Place 1 application into the nose 2 (two) times daily.    Dispense:  22 g    Refill:  0    Order Specific Question:   Supervising Provider    Answer:   Cassandria Anger [[id number]]   Follow-up: Return in about 6 days (around 02/25/2017) for DM and HTN with plotnikov.  Wilfred Lacy, NP

## 2017-02-20 LAB — BASIC METABOLIC PANEL
BUN: 12 mg/dL (ref 6–23)
CHLORIDE: 99 meq/L (ref 96–112)
CO2: 30 meq/L (ref 19–32)
Calcium: 9.6 mg/dL (ref 8.4–10.5)
Creatinine, Ser: 0.73 mg/dL (ref 0.40–1.20)
GFR: 100.37 mL/min (ref >60.00)
GLUCOSE: 242 mg/dL — AB (ref 70–99)
POTASSIUM: 4.5 meq/L (ref 3.5–5.1)
SODIUM: 134 meq/L — AB (ref 135–145)

## 2017-02-20 MED ORDER — INSULIN GLARGINE 100 UNITS/ML SOLOSTAR PEN: 10.0000 [IU] | PEN_INJECTOR | Freq: Every day | SUBCUTANEOUS | 3 refills | Status: DC

## 2017-02-20 MED ORDER — PEN NEEDLES 31G X 6 MM MISC: 1.0000 "application " | Freq: Every day | 2 refills | Status: DC

## 2017-02-20 MED ORDER — MUPIROCIN 2 % EX OINT: 1.0000 "application " | TOPICAL_OINTMENT | Freq: Two times a day (BID) | CUTANEOUS | 0 refills | Status: DC

## 2017-02-20 NOTE — Addendum Note (Signed)
Addended by: Wilfred Lacy L on: 02/20/2017 12:12 PM   Modules accepted: Orders

## 2017-02-21 ENCOUNTER — Other Ambulatory Visit: Payer: Self-pay

## 2017-02-21 MED ORDER — GLUCOSE BLOOD VI STRP
ORAL_STRIP | 12 refills | Status: DC
Start: 2017-02-21 — End: 2020-03-31

## 2017-02-21 MED ORDER — LANCETS MISC: 12 refills | Status: DC

## 2017-02-25 ENCOUNTER — Encounter: Payer: Self-pay | Admitting: Internal Medicine

## 2017-02-25 ENCOUNTER — Ambulatory Visit (INDEPENDENT_AMBULATORY_CARE_PROVIDER_SITE_OTHER): Payer: PPO | Admitting: Internal Medicine

## 2017-02-25 DIAGNOSIS — Z794 Long term (current) use of insulin: Secondary | ICD-10-CM

## 2017-02-25 DIAGNOSIS — E1140 Type 2 diabetes mellitus with diabetic neuropathy, unspecified: Secondary | ICD-10-CM | POA: Diagnosis not present

## 2017-02-25 DIAGNOSIS — I10 Essential (primary) hypertension: Secondary | ICD-10-CM

## 2017-02-25 DIAGNOSIS — M79672 Pain in left foot: Secondary | ICD-10-CM | POA: Diagnosis not present

## 2017-02-25 MED ORDER — REPAGLINIDE 1 MG PO TABS: 1.0000 mg | ORAL_TABLET | Freq: Three times a day (TID) | ORAL | 11 refills | Status: DC

## 2017-02-25 MED ORDER — EMPAGLIFLOZIN 25 MG PO TABS: 25.0000 mg | ORAL_TABLET | Freq: Every day | ORAL | 3 refills | Status: DC

## 2017-02-25 NOTE — Assessment & Plan Note (Signed)
Risks associated with treatment noncompliance were discussed. Compliance was encouraged. Hyzaar - restart

## 2017-02-25 NOTE — Patient Instructions (Addendum)
[facility] well w/Jill   Start Jardiance 10 mg a day samples, then 25 mg/day

## 2017-02-25 NOTE — Progress Notes (Signed)
Subjective:  Patient ID: Rachel [name], female    DOB: 05-11-1941  Age: 76 y.o. MRN: [id number]  CC: No chief complaint on file.   HPI EVONE ARSENEAU presents for HTN, DM f/u. Pt has not been taking her meds.Marland KitchenMarland KitchenShe is refusing to use insulin The pt has a wound on the L big toe since last wk (finished abx)  Outpatient Medications Prior to Visit  Medication Sig Dispense Refill  . albuterol (PROAIR HFA) 108 (90 Base) MCG/ACT inhaler Inhale 1-2 puffs into the lungs every 6 (six) hours as needed for wheezing or shortness of breath. 1 Inhaler 1  . aspirin 325 MG EC tablet Take 325 mg by mouth daily.    . blood glucose meter kit and supplies KIT Dispense based on patient and insurance preference. Use before breakfast and at bedtime. ICD10: E11.00 1 each 1  . doxycycline (VIBRA-TABS) 100 MG tablet Take 1 tablet (100 mg total) by mouth 2 (two) times daily. 14 tablet 0  . fluticasone (FLONASE) 50 MCG/ACT nasal spray Place 2 sprays into both nostrils daily. 16 g 1  . glimepiride (AMARYL) 1 MG tablet Take 1 tablet (1 mg total) by mouth daily with breakfast. 30 tablet 11  . glucose blood test strip Dispense based on patient and insurance preference. Use before breakfast and at bedtime. ICD10: E11.00 100 each 12  . insulin glargine (LANTUS) 100 unit/mL SOPN Inject 0.1 mLs (10 Units total) into the skin at bedtime. Increase dose by 2units every 3days till fasting CBG is between 80-120. 3 pen 3  . Insulin Pen Needle (PEN NEEDLES) 31G X 6 MM MISC 1 application by Does not apply route at bedtime. 100 each 2  . Lancets MISC Dispense based on patient and insurance preference. Use before breakfast and at bedtime. ICD10: E11.00 100 each 12  . loratadine (CLARITIN) 10 MG tablet Take 1 tablet (10 mg total) by mouth daily. 30 tablet 11  . losartan-hydrochlorothiazide (HYZAAR) 100-25 MG tablet TAKE 1 TABLET BY MOUTH DAILY. 30 tablet 1  . meloxicam (MOBIC) 15 MG tablet Take 1 tablet (15 mg total) by mouth  daily as needed for pain. 30 tablet 2  . montelukast (SINGULAIR) 10 MG tablet Take 1 tablet (10 mg total) by mouth at bedtime. 30 tablet 3  . mupirocin ointment (BACTROBAN) 2 % Apply 1 application topically 2 (two) times daily. 22 g 0   No facility-administered medications prior to visit.     ROS Review of Systems  Constitutional: Negative for activity change, appetite change, chills, fatigue and unexpected weight change.  HENT: Negative for congestion, mouth sores and sinus pressure.   Eyes: Negative for visual disturbance.  Respiratory: Negative for cough and chest tightness.   Gastrointestinal: Negative for abdominal pain and nausea.  Genitourinary: Negative for difficulty urinating, frequency and vaginal pain.  Musculoskeletal: Negative for back pain and gait problem.  Skin: Positive for wound. Negative for pallor and rash.  Neurological: Negative for dizziness, tremors, weakness, numbness and headaches.  Psychiatric/Behavioral: Negative for confusion and sleep disturbance.    Objective:  BP (!) 142/76 (BP Location: Left Arm, Patient Position: Sitting, Cuff Size: Large)   Pulse 74   Temp 97.6 F (36.4 C) (Oral)   Ht '5\' 3"'$  (1.6 m)   Wt 194 lb (88 kg)   SpO2 99%   BMI 34.37 kg/m   BP Readings from Last 3 Encounters:  02/25/17 (!) 142/76  02/19/17 (!) 230/80  07/09/16 (!) 142/84    Wt Readings  from Last 3 Encounters:  02/25/17 194 lb (88 kg)  02/19/17 199 lb (90.3 kg)  07/09/16 201 lb (91.2 kg)    Physical Exam  Constitutional: She appears well-developed. No distress.  HENT:  Head: Normocephalic.  Right Ear: External ear normal.  Left Ear: External ear normal.  Nose: Nose normal.  Mouth/Throat: Oropharynx is clear and moist.  Eyes: Conjunctivae are normal. Pupils are equal, round, and reactive to light. Right eye exhibits no discharge. Left eye exhibits no discharge.  Neck: Normal range of motion. Neck supple. No JVD present. No tracheal deviation present. No  thyromegaly present.  Cardiovascular: Normal rate, regular rhythm and normal heart sounds.   Pulmonary/Chest: No stridor. No respiratory distress. She has no wheezes.  Abdominal: Soft. Bowel sounds are normal. She exhibits no distension and no mass. There is no tenderness. There is no rebound and no guarding.  Musculoskeletal: She exhibits no edema or tenderness.  Lymphadenopathy:    She has no cervical adenopathy.  Neurological: She displays normal reflexes. No cranial nerve deficit. She exhibits normal muscle tone. Coordination normal.  Skin: No rash noted. No erythema.  Psychiatric: She has a normal mood and affect. Her behavior is normal. Judgment and thought content normal.   Big toe wound - healing   Lab Results  Component Value Date   WBC 8.2 03/14/2011   HGB 12.3 03/14/2011   HCT 36.3 03/14/2011   PLT 312.0 03/14/2011   GLUCOSE 242 (H) 02/19/2017   CHOL 212 (H) 12/12/2015   TRIG 146.0 12/12/2015   HDL 47.10 12/12/2015   LDLDIRECT 150.2 03/14/2011   LDLCALC 135 (H) 12/12/2015   ALT 14 12/12/2015   AST 15 12/12/2015   NA 134 (L) 02/19/2017   K 4.5 02/19/2017   CL 99 02/19/2017   CREATININE 0.73 02/19/2017   BUN 12 02/19/2017   CO2 30 02/19/2017   TSH 2.58 12/12/2015   HGBA1C 12.4 Repeated and verified X2. (H) 02/19/2017   MICROALBUR 1.8 12/12/2015    Dg Chest 2 View  Result Date: 07/09/2016 CLINICAL DATA:  Patient with cough and congestion for 3 weeks. EXAM: CHEST  2 VIEW COMPARISON:  Chest radiograph 01/04/2011. FINDINGS: Normal cardiac and mediastinal contours. No consolidative pulmonary opacities. No pleural effusion or pneumothorax. Mid thoracic spine degenerative changes. IMPRESSION: No active cardiopulmonary disease. Electronically Signed   By: Lovey Newcomer M.D.   On: 07/09/2016 14:11    Assessment & Plan:   There are no diagnoses linked to this encounter. I am having Ms. Feeser maintain her aspirin, loratadine, meloxicam, glimepiride, albuterol,  montelukast, doxycycline, blood glucose meter kit and supplies, losartan-hydrochlorothiazide, fluticasone, insulin glargine, Pen Needles, mupirocin ointment, Lancets, and glucose blood.  No orders of the defined types were placed in this encounter.    Follow-up: No Follow-up on file.  Walker Kehr, MD

## 2017-02-25 NOTE — Assessment & Plan Note (Signed)
Topical care Finished Doxy Mupirocin

## 2017-02-25 NOTE — Assessment & Plan Note (Signed)
Pt has not been taking her meds.Rachel KitchenMarland KitchenShe is refusing to use insulin Start Prandin and Jardiance 10 mg a day samples, then 25 mg/d  Potential benefits of a long term opioids use as well as potential risks (i.e. addiction risk, apnea etc) and complications (i.e. Somnolence, constipation and others) were explained to the patient and were aknowledged.

## 2017-03-19 ENCOUNTER — Ambulatory Visit: Payer: PPO | Admitting: Podiatry

## 2017-03-19 ENCOUNTER — Other Ambulatory Visit: Payer: Self-pay | Admitting: Internal Medicine

## 2017-03-28 DIAGNOSIS — M26621 Arthralgia of right temporomandibular joint: Secondary | ICD-10-CM | POA: Diagnosis not present

## 2017-03-28 DIAGNOSIS — H9201 Otalgia, right ear: Secondary | ICD-10-CM | POA: Insufficient documentation

## 2017-04-02 ENCOUNTER — Ambulatory Visit: Payer: PPO | Admitting: Podiatry

## 2017-04-16 ENCOUNTER — Other Ambulatory Visit: Payer: Self-pay

## 2017-04-16 ENCOUNTER — Encounter: Payer: Self-pay | Admitting: Internal Medicine

## 2017-04-16 ENCOUNTER — Other Ambulatory Visit (INDEPENDENT_AMBULATORY_CARE_PROVIDER_SITE_OTHER): Payer: PPO

## 2017-04-16 ENCOUNTER — Ambulatory Visit (INDEPENDENT_AMBULATORY_CARE_PROVIDER_SITE_OTHER): Payer: PPO | Admitting: Internal Medicine

## 2017-04-16 VITALS — BP 148/80 | HR 65 | Resp 20 | Ht 63.0 in | Wt 195.0 lb

## 2017-04-16 DIAGNOSIS — I10 Essential (primary) hypertension: Secondary | ICD-10-CM | POA: Diagnosis not present

## 2017-04-16 DIAGNOSIS — Z0000 Encounter for general adult medical examination without abnormal findings: Secondary | ICD-10-CM

## 2017-04-16 DIAGNOSIS — E2839 Other primary ovarian failure: Secondary | ICD-10-CM

## 2017-04-16 DIAGNOSIS — IMO0001 Reserved for inherently not codable concepts without codable children: Secondary | ICD-10-CM

## 2017-04-16 DIAGNOSIS — Z1231 Encounter for screening mammogram for malignant neoplasm of breast: Secondary | ICD-10-CM

## 2017-04-16 DIAGNOSIS — Z23 Encounter for immunization: Secondary | ICD-10-CM

## 2017-04-16 DIAGNOSIS — E1165 Type 2 diabetes mellitus with hyperglycemia: Secondary | ICD-10-CM

## 2017-04-16 DIAGNOSIS — E1140 Type 2 diabetes mellitus with diabetic neuropathy, unspecified: Secondary | ICD-10-CM | POA: Diagnosis not present

## 2017-04-16 DIAGNOSIS — Z1239 Encounter for other screening for malignant neoplasm of breast: Secondary | ICD-10-CM

## 2017-04-16 LAB — BASIC METABOLIC PANEL
BUN: 17 mg/dL (ref 6–23)
CO2: 28 mEq/L (ref 19–32)
CREATININE: 0.7 mg/dL (ref 0.40–1.20)
Calcium: 10.4 mg/dL (ref 8.4–10.5)
Chloride: 101 mEq/L (ref 96–112)
GFR: 105.31 mL/min (ref >60.00)
Glucose, Bld: 117 mg/dL — ABNORMAL HIGH (ref 70–99)
Potassium: 4 mEq/L (ref 3.5–5.1)
Sodium: 138 mEq/L (ref 135–145)

## 2017-04-16 LAB — HEMOGLOBIN A1C: Hgb A1c MFr Bld: 10.3 % — ABNORMAL HIGH (ref 4.6–6.5)

## 2017-04-16 MED ORDER — LOSARTAN POTASSIUM-HCTZ 100-25 MG PO TABS: ORAL_TABLET | ORAL | 1 refills | Status: DC

## 2017-04-16 MED ORDER — EMPAGLIFLOZIN 25 MG PO TABS: 25.0000 mg | ORAL_TABLET | Freq: Every day | ORAL | 3 refills | Status: DC

## 2017-04-16 NOTE — Assessment & Plan Note (Signed)
She will start [place]

## 2017-04-16 NOTE — Progress Notes (Signed)
Subjective:  Patient ID: Rachel [name], female    DOB: Jul 03, 1941  Age: 76 y.o. MRN: [id number]  CC: Medicare Wellness   HPI Rachel [name] presents for HTN, DM, obesity The pt has not started [place] yet...  Outpatient Medications Prior to Visit  Medication Sig Dispense Refill  . albuterol (PROAIR HFA) 108 (90 Base) MCG/ACT inhaler Inhale 1-2 puffs into the lungs every 6 (six) hours as needed for wheezing or shortness of breath. 1 Inhaler 1  . aspirin 325 MG EC tablet Take 325 mg by mouth daily.    . blood glucose meter kit and supplies KIT Dispense based on patient and insurance preference. Use before breakfast and at bedtime. ICD10: E11.00 1 each 1  . fluticasone (FLONASE) 50 MCG/ACT nasal spray Place 2 sprays into both nostrils daily. 16 g 1  . glucose blood test strip Dispense based on patient and insurance preference. Use before breakfast and at bedtime. ICD10: E11.00 100 each 12  . ibuprofen (ADVIL,MOTRIN) 600 MG tablet TAKE 1 TABLET (600 MG TOTAL) BY MOUTH 2 (TWO) TIMES DAILY AS NEEDED. 60 tablet 1  . Lancets MISC Dispense based on patient and insurance preference. Use before breakfast and at bedtime. ICD10: E11.00 100 each 12  . loratadine (CLARITIN) 10 MG tablet Take 1 tablet (10 mg total) by mouth daily. 30 tablet 11  . losartan-hydrochlorothiazide (HYZAAR) 100-25 MG tablet TAKE 1 TABLET BY MOUTH DAILY. 30 tablet 1  . montelukast (SINGULAIR) 10 MG tablet Take 1 tablet (10 mg total) by mouth at bedtime. 30 tablet 3  . mupirocin ointment (BACTROBAN) 2 % Apply 1 application topically 2 (two) times daily. 22 g 0  . repaglinide (PRANDIN) 1 MG tablet Take 1 tablet (1 mg total) by mouth 3 (three) times daily before meals. 90 tablet 11  . empagliflozin (JARDIANCE) 25 MG TABS tablet Take 25 mg by mouth daily. (Patient not taking: Reported on 04/16/2017) 90 tablet 3   No facility-administered medications prior to visit.     ROS Review of Systems  Constitutional:  Negative for activity change, appetite change, chills, fatigue and unexpected weight change.  HENT: Negative for congestion, mouth sores and sinus pressure.   Eyes: Negative for visual disturbance.  Respiratory: Negative for cough and chest tightness.   Gastrointestinal: Negative for abdominal pain and nausea.  Genitourinary: Negative for difficulty urinating, frequency and vaginal pain.  Musculoskeletal: Negative for back pain and gait problem.  Skin: Negative for pallor and rash.  Neurological: Negative for dizziness, tremors, weakness, numbness and headaches.  Psychiatric/Behavioral: Negative for confusion and sleep disturbance.    Objective:  BP (!) 148/80 Comment: L arm  Pulse 65   Resp 20   Ht '5\' 3"'$  (1.6 m)   Wt 195 lb (88.5 kg)   SpO2 98% Comment: r/a  BMI 34.54 kg/m   BP Readings from Last 3 Encounters:  04/16/17 (!) 148/80  02/25/17 (!) 142/76  02/19/17 (!) 230/80    Wt Readings from Last 3 Encounters:  04/16/17 195 lb (88.5 kg)  02/25/17 194 lb (88 kg)  02/19/17 199 lb (90.3 kg)    Physical Exam  Constitutional: She appears well-developed. No distress.  HENT:  Head: Normocephalic.  Right Ear: External ear normal.  Left Ear: External ear normal.  Nose: Nose normal.  Mouth/Throat: Oropharynx is clear and moist.  Eyes: Pupils are equal, round, and reactive to light. Conjunctivae are normal. Right eye exhibits no discharge. Left eye exhibits no discharge.  Neck: Normal range of  motion. Neck supple. No JVD present. No tracheal deviation present. No thyromegaly present.  Cardiovascular: Normal rate, regular rhythm and normal heart sounds.   Pulmonary/Chest: No stridor. No respiratory distress. She has no wheezes.  Abdominal: Soft. Bowel sounds are normal. She exhibits no distension and no mass. There is no tenderness. There is no rebound and no guarding.  Musculoskeletal: She exhibits no edema or tenderness.  Lymphadenopathy:    She has no cervical adenopathy.    Neurological: She displays normal reflexes. No cranial nerve deficit. She exhibits normal muscle tone. Coordination normal.  Skin: No rash noted. No erythema.  Psychiatric: She has a normal mood and affect. Her behavior is normal. Judgment and thought content normal.    Lab Results  Component Value Date   WBC 8.2 03/14/2011   HGB 12.3 03/14/2011   HCT 36.3 03/14/2011   PLT 312.0 03/14/2011   GLUCOSE 242 (H) 02/19/2017   CHOL 212 (H) 12/12/2015   TRIG 146.0 12/12/2015   HDL 47.10 12/12/2015   LDLDIRECT 150.2 03/14/2011   LDLCALC 135 (H) 12/12/2015   ALT 14 12/12/2015   AST 15 12/12/2015   NA 134 (L) 02/19/2017   K 4.5 02/19/2017   CL 99 02/19/2017   CREATININE 0.73 02/19/2017   BUN 12 02/19/2017   CO2 30 02/19/2017   TSH 2.58 12/12/2015   HGBA1C 12.4 Repeated and verified X2. (H) 02/19/2017   MICROALBUR 1.8 12/12/2015    Dg Chest 2 View  Result Date: 07/09/2016 CLINICAL DATA:  Patient with cough and congestion for 3 weeks. EXAM: CHEST  2 VIEW COMPARISON:  Chest radiograph 01/04/2011. FINDINGS: Normal cardiac and mediastinal contours. No consolidative pulmonary opacities. No pleural effusion or pneumothorax. Mid thoracic spine degenerative changes. IMPRESSION: No active cardiopulmonary disease. Electronically Signed   By: Lovey Newcomer M.D.   On: 07/09/2016 14:11    Assessment & Plan:   There are no diagnoses linked to this encounter. I have discontinued Ms. Yasui's empagliflozin. I am also having her maintain her aspirin, loratadine, albuterol, montelukast, blood glucose meter kit and supplies, fluticasone, mupirocin ointment, Lancets, glucose blood, repaglinide, ibuprofen, and losartan-hydrochlorothiazide.  No orders of the defined types were placed in this encounter.    Follow-up: No Follow-up on file.  Walker Kehr, MD

## 2017-04-16 NOTE — Progress Notes (Signed)
Subjective:   Rachel [name] is a 76 y.o. female who presents for Medicare Annual (Subsequent) preventive examination.  Review of Systems:  No ROS.  Medicare Wellness Visit. Additional risk factors are reflected in the social history.  Cardiac Risk Factors include: advanced age (>22mn, >>39women);diabetes mellitus;hypertension Sleep patterns: gets up 1-2 times nightly to void and sleeps 7-8 hours nightly.    Home Safety/Smoke Alarms: Feels safe in home. Smoke alarms in place.  Living environment; residence and Firearm Safety: 2-story house, no firearms. Seat Belt Safety/Bike Helmet: Wears seat belt.   Counseling:   Eye Exam- referral placed today Dental- resources given  Female:   Pap- N/A       Mammo-  referral placed today Dexa scan- referral placed today [facility]- N/D, cologuard order placed      Objective:     Vitals: BP (!) 148/80 Comment: L arm  Pulse 65   Resp 20   Ht '5\' 3"'$  (1.6 m)   Wt 195 lb (88.5 kg)   SpO2 98% Comment: r/a  BMI 34.54 kg/m   Body mass index is 34.54 kg/m.   Tobacco History  Smoking Status  . Never Smoker  Smokeless Tobacco  . Never Used     Counseling given: Not Answered   Past Medical History:  Diagnosis Date  . Allergic rhinitis   . Diabetes mellitus, type 2 (HAda   . Hyperlipemia   . Hypertension   . Lipoma NEC    anterior upper chest  . Osteoarthritis of knee    Past Surgical History:  Procedure Laterality Date  . ACNE CYST REMOVAL  1970   back  . APPENDECTOMY  1963  . OOPHORECTOMY  1985  . VAGINAL HYSTERECTOMY  1985   complete   Family History  Problem Relation Age of Onset  . Cancer Father        brain  . Diabetes Mother   . Heart attack Mother   . Diabetes Sister   . Diabetes Brother   . Hypertension Sister   . Hypertension Brother   . Aneurysm Other        niece  . Coronary artery disease Brother    History  Sexual Activity  . Sexual activity: No    Outpatient Encounter Prescriptions as of  04/16/2017  Medication Sig  . albuterol (PROAIR HFA) 108 (90 Base) MCG/ACT inhaler Inhale 1-2 puffs into the lungs every 6 (six) hours as needed for wheezing or shortness of breath.  .Marland Kitchenaspirin 325 MG EC tablet Take 325 mg by mouth daily.  . blood glucose meter kit and supplies KIT Dispense based on patient and insurance preference. Use before breakfast and at bedtime. ICD10: E11.00  . fluticasone (FLONASE) 50 MCG/ACT nasal spray Place 2 sprays into both nostrils daily.  .Marland Kitchenglucose blood test strip Dispense based on patient and insurance preference. Use before breakfast and at bedtime. ICD10: E11.00  . ibuprofen (ADVIL,MOTRIN) 600 MG tablet TAKE 1 TABLET (600 MG TOTAL) BY MOUTH 2 (TWO) TIMES DAILY AS NEEDED.  .Marland KitchenLancets MISC Dispense based on patient and insurance preference. Use before breakfast and at bedtime. ICD10: E11.00  . loratadine (CLARITIN) 10 MG tablet Take 1 tablet (10 mg total) by mouth daily.  .Marland Kitchenlosartan-hydrochlorothiazide (HYZAAR) 100-25 MG tablet TAKE 1 TABLET BY MOUTH DAILY.  . montelukast (SINGULAIR) 10 MG tablet Take 1 tablet (10 mg total) by mouth at bedtime.  . mupirocin ointment (BACTROBAN) 2 % Apply 1 application topically 2 (two) times daily.  .[name]  repaglinide (PRANDIN) 1 MG tablet Take 1 tablet (1 mg total) by mouth 3 (three) times daily before meals.  . empagliflozin (JARDIANCE) 25 MG TABS tablet Take 25 mg by mouth daily.  . [DISCONTINUED] empagliflozin (JARDIANCE) 25 MG TABS tablet Take 25 mg by mouth daily. (Patient not taking: Reported on 04/16/2017)  . [DISCONTINUED] losartan-hydrochlorothiazide (HYZAAR) 100-25 MG tablet TAKE 1 TABLET BY MOUTH DAILY.   No facility-administered encounter medications on file as of 04/16/2017.     Activities of Daily Living In your present state of health, do you have any difficulty performing the following activities: 04/16/2017  Hearing? N  Vision? N  Difficulty concentrating or making decisions? N  Walking or climbing stairs? N    Dressing or bathing? N  Doing errands, shopping? N  Preparing Food and eating ? N  Using the Toilet? N  In the past six months, have you accidently leaked urine? N  Do you have problems with loss of bowel control? N  Managing your Medications? N  Managing your Finances? N  Housekeeping or managing your Housekeeping? N  Some recent data might be hidden    Patient Care Team: Plotnikov, Evie Lacks, MD as PCP - General    Assessment:    Physical assessment deferred to PCP.    Exercise Activities and Dietary recommendations Current Exercise Habits: Home exercise routine, Type of exercise: walking, Time (Minutes): 30, Frequency (Times/Week): 4, Weekly Exercise (Minutes/Week): 120, Exercise limited by: None identified  Diet (meal preparation, eat out, water intake, caffeinated beverages, dairy products, fruits and vegetables): in general, a "healthy" diet   eats a variety of fruits and vegetables daily, limits salt   Reviewed heart healthy and diabetic diet, encouraged patient to increase daily water intake. Diet education was attached to patient's AVS.   Goals    . Exercise 150 minutes per week (moderate activity)          Will try to go to community center and walk around trail  Will try to walk and build up to 30 minutes a day; especially;     . retire and do more traveling, enjoy life.      Fall Risk Fall Risk  04/16/2017 12/14/2015 12/14/2015  Falls in the past year? No No No   Depression Screen PHQ 2/9 Scores 04/16/2017 12/14/2015 12/14/2015  PHQ - 2 Score 0 0 0     Cognitive Function       Ad8 score reviewed for issues:  Issues making decisions: no  Less interest in hobbies / activities: no  Repeats questions, stories (family complaining): no  Trouble using ordinary gadgets (microwave, computer, phone):no  Forgets the month or year: no  Mismanaging finances: no  Remembering appts: no  Daily problems with thinking and/or memory: no Ad8 score is=  0  Immunization History  Administered Date(s) Administered  . PPD Test 03/14/2011  . Pneumococcal Conjugate-13 04/16/2017  . Pneumococcal Polysaccharide-23 09/15/2008  . Td 09/15/2008   Screening Tests Health Maintenance  Topic Date Due  . COLONOSCOPY  09/04/1993  . DEXA SCAN  09/04/2008  . PNA vac Low Risk Adult (2 of 2 - PCV13) 09/15/2009  . OPHTHALMOLOGY EXAM  05/25/2010  . FOOT EXAM  02/18/2014  . INFLUENZA VACCINE  04/24/2017  . HEMOGLOBIN A1C  08/22/2017  . TETANUS/TDAP  09/15/2018      Plan:    Start to eat heart healthy diet (full of fruits, vegetables, whole grains, lean protein, water--limit salt, fat, and sugar intake) and increase physical  activity as tolerated.  Continue doing brain stimulating activities (puzzles, reading, adult coloring books, staying active) to keep memory sharp.    I have personally reviewed and noted the following in the patient's chart:   . Medical and social history . Use of alcohol, tobacco or illicit drugs  . Current medications and supplements . Functional ability and status . Nutritional status . Physical activity . Advanced directives . List of other physicians . Vitals . Screenings to include cognitive, depression, and falls . Referrals and appointments  In addition, I have reviewed and discussed with patient certain preventive protocols, quality metrics, and best practice recommendations. A written personalized care plan for preventive services as well as general preventive health recommendations were provided to patient.     Michiel Cowboy, RN  04/16/2017  Medical screening examination/treatment/procedure(s) were performed by non-physician practitioner and as supervising physician I was immediately available for consultation/collaboration. I agree with above. Walker Kehr, MD

## 2017-04-16 NOTE — Progress Notes (Signed)
Pre visit review using our clinic review tool, if applicable. No additional management support is needed unless otherwise documented below in the visit note. 

## 2017-04-16 NOTE — Patient Instructions (Addendum)
Start to eat heart healthy diet (full of fruits, vegetables, whole grains, lean protein, water--limit salt, fat, and sugar intake) and increase physical activity as tolerated.  Continue doing brain stimulating activities (puzzles, reading, adult coloring books, staying active) to keep memory sharp.    [name] , Thank you for taking time to come for your Medicare Wellness Visit. I appreciate your ongoing commitment to your health goals. Please review the following plan we discussed and let me know if I can assist you in the future.   These are the goals we discussed: Goals    . Exercise 150 minutes per week (moderate activity)          Will try to go to community center and walk around trail  Will try to walk and build up to 30 minutes a day; especially;     . retire and do more traveling, enjoy life.       This is a list of the screening recommended for you and due dates:  Health Maintenance  Topic Date Due  . Colon Cancer Screening  09/04/1993  . DEXA scan (bone density measurement)  09/04/2008  . Pneumonia vaccines (2 of 2 - PCV13) 09/15/2009  . Eye exam for diabetics  05/25/2010  . Complete foot exam   02/18/2014  . Flu Shot  04/24/2017  . Hemoglobin A1C  08/22/2017  . Tetanus Vaccine  09/15/2018    Cholesterol Cholesterol is a fat. Your body needs a small amount of cholesterol. Cholesterol (plaque) may build up in your blood vessels (arteries). That makes you more likely to have a heart attack or stroke. You cannot feel your cholesterol level. Having a blood test is the only way to find out if your level is high. Keep your test results. Work with your doctor to keep your cholesterol at a good level. What do the results mean? Total cholesterol is how much cholesterol is in your blood. LDL is bad cholesterol. This is the type that can build up. Try to have low LDL. HDL is good cholesterol. It cleans your blood vessels and carries LDL away. Try to have high  HDL. Triglycerides are fat that the body can store or burn for energy. What are good levels of cholesterol? Total cholesterol below 200. LDL below 100 is good for people who have health risks. LDL below 70 is good for people who have very high risks. HDL above 40 is good. It is best to have HDL of 60 or higher. Triglycerides below 150. How can I lower my cholesterol? Diet Follow your diet program as told by your doctor. Choose fish, white meat chicken, or [place] that is roasted or baked. Try not to eat red meat, fried foods, sausage, or lunch meats. Eat lots of fresh fruits and vegetables. Choose whole grains, beans, pasta, potatoes, and cereals. Choose olive oil, corn oil, or canola oil. Only use small amounts. Try not to eat butter, mayonnaise, shortening, or palm kernel oils. Try not to eat foods with trans fats. Choose low-fat or nonfat dairy foods. Drink skim or nonfat milk. Eat low-fat or nonfat yogurt and cheeses. Try not to drink whole milk or cream. Try not to eat ice cream, egg yolks, or full-fat cheeses. Healthy desserts include angel food cake, ginger snaps, animal crackers, hard candy, popsicles, and low-fat or nonfat frozen yogurt. Try not to eat pastries, cakes, pies, and cookies.  Exercise Follow your exercise program as told by your doctor. Be more active. Try gardening, walking, and  taking the stairs. Ask your doctor about ways that you can be more active.  Medicine Take over-the-counter and prescription medicines only as told by your doctor.  This information is not intended to replace advice given to you by your health care provider. Make sure you discuss any questions you have with your health care provider. Document Released: 12/07/2008 Document Revised: 04/11/2016 Document Reviewed: 03/22/2016 Elsevier Interactive Patient Education  2017 [employer]  Cholesterol Cholesterol is a white, waxy, fat-like substance that is needed by the human body in small  amounts. The liver makes all the cholesterol we need. Cholesterol is carried from the liver by the blood through the blood vessels. Deposits of cholesterol (plaques) may build up on blood vessel (artery) walls. Plaques make the arteries narrower and stiffer. Cholesterol plaques increase the risk for heart attack and stroke. You cannot feel your cholesterol level even if it is very high. The only way to know that it is high is to have a blood test. Once you know your cholesterol levels, you should keep a record of the test results. Work with your health care provider to keep your levels in the desired range. What do the results mean?  Total cholesterol is a rough measure of all the cholesterol in your blood.  LDL (low-density lipoprotein) is the "bad" cholesterol. This is the type that causes plaque to build up on the artery walls. You want this level to be low.  HDL (high-density lipoprotein) is the "good" cholesterol because it cleans the arteries and carries the LDL away. You want this level to be high.  Triglycerides are fat that the body can either burn for energy or store. High levels are closely linked to heart disease. What are the desired levels of cholesterol?  Total cholesterol below 200.  LDL below 100 for people who are at risk, below 70 for people at very high risk.  HDL above 40 is good. A level of 60 or higher is considered to be protective against heart disease.  Triglycerides below 150. How can I lower my cholesterol? Diet Follow your diet program as told by your health care provider.  Choose fish or white meat chicken and [place], roasted or baked. Limit fatty cuts of red meat, fried foods, and processed meats, such as sausage and lunch meats.  Eat lots of fresh fruits and vegetables.  Choose whole grains, beans, pasta, potatoes, and cereals.  Choose olive oil, corn oil, or canola oil, and use only small amounts.  Avoid butter, mayonnaise, shortening, or palm kernel  oils.  Avoid foods with trans fats.  Drink skim or nonfat milk and eat low-fat or nonfat yogurt and cheeses. Avoid whole milk, cream, ice cream, egg yolks, and full-fat cheeses.  Healthier desserts include angel food cake, ginger snaps, animal crackers, hard candy, popsicles, and low-fat or nonfat frozen yogurt. Avoid pastries, cakes, pies, and cookies.  Exercise  Follow your exercise program as told by your health care provider. A regular program: ? Helps to decrease LDL and raise HDL. ? Helps with weight control.  Do things that increase your activity level, such as gardening, walking, and taking the stairs.  Ask your health care provider about ways that you can be more active in your daily life.  Medicine  Take over-the-counter and prescription medicines only as told by your health care provider. ? Medicine may be prescribed by your health care provider to help lower cholesterol and decrease the risk for heart disease. This is usually done if  diet and exercise have failed to bring down cholesterol levels. ? If you have several risk factors, you may need medicine even if your levels are normal.  This information is not intended to replace advice given to you by your health care provider. Make sure you discuss any questions you have with your health care provider. Document Released: 06/05/2001 Document Revised: 04/07/2016 Document Reviewed: 03/10/2016 Elsevier Interactive Patient Education  2017 [place] for Diabetes Mellitus, Adult Carbohydrate counting is a method for keeping track of how many carbohydrates you eat. Eating carbohydrates naturally increases the amount of sugar (glucose) in the blood. Counting how many carbohydrates you eat helps keep your blood glucose within normal limits, which helps you manage your diabetes (diabetes mellitus). It is important to know how many carbohydrates you can safely have in each meal. This is different for every  person. A diet and nutrition specialist (registered dietitian) can help you make a meal plan and calculate how many carbohydrates you should have at each meal and snack. Carbohydrates are found in the following foods:  Grains, such as breads and cereals.  Dried beans and soy products.  Starchy vegetables, such as potatoes, peas, and corn.  Fruit and fruit juices.  Milk and yogurt.  Sweets and snack foods, such as cake, cookies, candy, chips, and soft drinks.  How do I count carbohydrates? There are two ways to count carbohydrates in food. You can use either of the methods or a combination of both. Reading "Nutrition Facts" on packaged food The "Nutrition Facts" list is included on the labels of almost all packaged foods and beverages in the [place] It includes:  The serving size.  Information about nutrients in each serving, including the grams (g) of carbohydrate per serving.  To use the "Nutrition Facts":  Decide how many servings you will have.  Multiply the number of servings by the number of carbohydrates per serving.  The resulting number is the total amount of carbohydrates that you will be having.  Learning standard serving sizes of other foods When you eat foods containing carbohydrates that are not packaged or do not include "Nutrition Facts" on the label, you need to measure the servings in order to count the amount of carbohydrates:  Measure the foods that you will eat with a food scale or measuring cup, if needed.  Decide how many standard-size servings you will eat.  Multiply the number of servings by 15. Most carbohydrate-rich foods have about 15 g of carbohydrates per serving. ? For example, if you eat 8 oz (170 g) of strawberries, you will have eaten 2 servings and 30 g of carbohydrates (2 servings x 15 g = 30 g).  For foods that have more than one food mixed, such as soups and casseroles, you must count the carbohydrates in each food that is included.  The  following list contains standard serving sizes of common carbohydrate-rich foods. Each of these servings has about 15 g of carbohydrates:   hamburger bun or  English muffin.   oz (15 mL) syrup.   oz (14 g) jelly.  1 slice of bread.  1 six-inch tortilla.  3 oz (85 g) cooked rice or pasta.  4 oz (113 g) cooked dried beans.  4 oz (113 g) starchy vegetable, such as peas, corn, or potatoes.  4 oz (113 g) hot cereal.  4 oz (113 g) mashed potatoes or  of a large baked potato.  4 oz (113 g) canned or frozen fruit.  4 oz (120 mL) fruit juice.  4-6 crackers.  6 chicken nuggets.  6 oz (170 g) unsweetened dry cereal.  6 oz (170 g) plain fat-free yogurt or yogurt sweetened with artificial sweeteners.  8 oz (240 mL) milk.  8 oz (170 g) fresh fruit or one small piece of fruit.  24 oz (680 g) popped popcorn.  Example of carbohydrate counting Sample meal  3 oz (85 g) chicken breast.  6 oz (170 g) brown rice.  4 oz (113 g) corn.  8 oz (240 mL) milk.  8 oz (170 g) strawberries with sugar-free whipped topping. Carbohydrate calculation 1. Identify the foods that contain carbohydrates: ? Rice. ? Corn. ? Milk. ? Strawberries. 2. Calculate how many servings you have of each food: ? 2 servings rice. ? 1 serving corn. ? 1 serving milk. ? 1 serving strawberries. 3. Multiply each number of servings by 15 g: ? 2 servings rice x 15 g = 30 g. ? 1 serving corn x 15 g = 15 g. ? 1 serving milk x 15 g = 15 g. ? 1 serving strawberries x 15 g = 15 g. 4. Add together all of the amounts to find the total grams of carbohydrates eaten: ? 30 g + 15 g + 15 g + 15 g = 75 g of carbohydrates total. This information is not intended to replace advice given to you by your health care provider. Make sure you discuss any questions you have with your health care provider. Document Released: 09/10/2005 Document Revised: 03/30/2016 Document Reviewed: 02/22/2016 Elsevier Interactive Patient  Education  2018 [employer]. Diabetes Mellitus and Food It is important for you to manage your blood sugar (glucose) level. Your blood glucose level can be greatly affected by what you eat. Eating healthier foods in the appropriate amounts throughout the day at about the same time each day will help you control your blood glucose level. It can also help slow or prevent worsening of your diabetes mellitus. Healthy eating may even help you improve the level of your blood pressure and reach or maintain a healthy weight. General recommendations for healthful eating and cooking habits include:  Eating meals and snacks regularly. Avoid going long periods of time without eating to lose weight.  Eating a diet that consists mainly of plant-based foods, such as fruits, vegetables, nuts, legumes, and whole grains.  Using low-heat cooking methods, such as baking, instead of high-heat cooking methods, such as deep frying.  Work with your dietitian to make sure you understand how to use the Nutrition Facts information on food labels. How can food affect me? Carbohydrates Carbohydrates affect your blood glucose level more than any other type of food. Your dietitian will help you determine how many carbohydrates to eat at each meal and teach you how to count carbohydrates. Counting carbohydrates is important to keep your blood glucose at a healthy level, especially if you are using insulin or taking certain medicines for diabetes mellitus. Alcohol Alcohol can cause sudden decreases in blood glucose (hypoglycemia), especially if you use insulin or take certain medicines for diabetes mellitus. Hypoglycemia can be a life-threatening condition. Symptoms of hypoglycemia (sleepiness, dizziness, and disorientation) are similar to symptoms of having too much alcohol. If your health care provider has given you approval to drink alcohol, do so in moderation and use the following guidelines:  Women should not have more  than one drink per day, and men should not have more than two drinks per day. One drink is  equal to: ? 12 oz of beer. ? 5 oz of [place]. ? 1 oz of hard liquor.  Do not drink on an empty stomach.  Keep yourself hydrated. Have water, diet soda, or unsweetened iced tea.  Regular soda, juice, and other mixers might contain a lot of carbohydrates and should be counted.  What foods are not recommended? As you make food choices, it is important to remember that all foods are not the same. Some foods have fewer nutrients per serving than other foods, even though they might have the same number of calories or carbohydrates. It is difficult to get your body what it needs when you eat foods with fewer nutrients. Examples of foods that you should avoid that are high in calories and carbohydrates but low in nutrients include:  Trans fats (most processed foods list trans fats on the Nutrition Facts label).  Regular soda.  Juice.  Candy.  Sweets, such as cake, pie, doughnuts, and cookies.  Fried foods.  What foods can I eat? Eat nutrient-rich foods, which will nourish your body and keep you healthy. The food you should eat also will depend on several factors, including:  The calories you need.  The medicines you take.  Your weight.  Your blood glucose level.  Your blood pressure level.  Your cholesterol level.  You should eat a variety of foods, including:  Protein. ? Lean cuts of meat. ? Proteins low in saturated fats, such as fish, egg whites, and beans. Avoid processed meats.  Fruits and vegetables. ? Fruits and vegetables that may help control blood glucose levels, such as apples, mangoes, and yams.  Dairy products. ? Choose fat-free or low-fat dairy products, such as milk, yogurt, and cheese.  Grains, bread, pasta, and rice. ? Choose whole grain products, such as multigrain bread, whole oats, and brown rice. These foods may help control blood pressure.  Fats. ? Foods  containing healthful fats, such as nuts, avocado, olive oil, canola oil, and fish.  Does everyone with diabetes mellitus have the same meal plan? Because every person with diabetes mellitus is different, there is not one meal plan that works for everyone. It is very important that you meet with a dietitian who will help you create a meal plan that is just right for you. This information is not intended to replace advice given to you by your health care provider. Make sure you discuss any questions you have with your health care provider. Document Released: 06/07/2005 Document Revised: 02/16/2016 Document Reviewed: 08/07/2013 Elsevier Interactive Patient Education  2017 [place] that are packaged or in containers have a Nutrition Facts panel on the side or back. This is commonly called the food label. The food label helps you make healthy food choices by providing information about serving size and the amount of calories and various nutrients in the food. You can check the food label to find out if the food contains high or low amounts of nutrients that you want to limit in your diet. You can also use the food label to see if the food is a good source of the nutrients that you want to make sure are included in your diet. How do I read the food label?  Begin by checking the serving size and number of servings in the container. All of the nutrition information listed on the food label is based on one serving. If you eat more than one serving, you must multiply the amounts (such as calories,  grams of saturated fat, or milligrams of sodium) by the number of servings.  Check the calories. Choosing foods that are low in calories can help you manage your weight.  Look at the numbers in the % Daily Value column for each listed nutrient. This gives you an idea of how much of the daily recommended amount for that nutrient is provided in one serving of the food. A daily value of 5%  or less is considered low. A daily value of 20% or more is considered high.  Check the amounts for the items you should limit in your diet. These include: ? Total fat. ? Saturated fat. ? Trans fat. ? Cholesterol. ? Sodium.  Check the amounts for the items you should make sure you get enough of. These include: ? Dietary fiber. ? Vitamins A and C. ? Calcium. ? Iron. What information is provided on the food label? Serving information  Serving size. ? The serving size is listed in cups or pieces. The nutrient amounts listed on the food label apply to this amount of the food.  Servings per container or package. ? This shows the number of servings you can expect to get from the container or package if you follow the suggested serving size. Amount per serving  Calories. ? The number of calories in one serving of the food. This information is helpful in managing weight. Low-calorie foods contain 40 calories or less. High-calorie foods contain 400 or more calories.  Calories from fat. ? The number of calories that come from fat in one serving. Percent daily value Percent daily value (shown on the label as % Daily Value) tells you what percent of the daily value for each nutrient one serving provides. The daily value is the recommended amount of the nutrient that you should get each day. For example, if 15% is listed next to dietary fiber, it means that one serving of the food will give you 15% of the recommended amount of fiber that you should get in a day. The daily values are based on a 2,000-calorie-per-day diet. You may get more or less than 2,000 calories in your diet each day, but the % Daily Value gives you an idea of whether the food contains a high or low amount of the listed nutrient. A daily value of 5% or less is low. A daily value of 20% or more is high. Total fat Total fat shows you the total amount of fat in one serving (listed in grams). Foods with high amounts of fat usually  have higher calories and may lead to weight gain. Two of the fats that make up a portion of the total fat are included on the label:  Saturated fat. ? This number is the amount of saturated fat in one serving (listed in grams). Saturated fat increases the amount of blood cholesterol and should be limited to less than 7% of total calories each day. This means that if you eat 2,000 calories each day, you should eat less than 140 calories from saturated fat.    Cholesterol The amount of cholesterol in one serving is listed in milligrams. Cholesterol should be limited to no more than 300 mg each day. Sodium The amount of sodium in one serving is listed in milligrams. Most people should limit their sodium intake to 2,300 mg a day. Total carbohydrate This number shows the amount of total carbohydrate in one serving (listed in grams). This information can help people with diabetes manage the amount of  carbohydrate they eat. Two of the carbohydrates that make up a portion of the total carbohydrate are included on the label:  Dietary fiber. ? The amount of dietary fiber in one serving is listed in grams. Most people should eat at least 25 g of dietary fiber each day.  Sugars. ? The amount of sugar in one serving is also listed in grams. This value includes both naturally occurring sugars from fruit and milk and added sugars such as honey or table sugar.  Protein The amount of protein in one serving is listed in grams. What other important labeling is on the food package? Ingredients Food labels will list each ingredient in the food. The first ingredient listed is the ingredient that the food has the most of. The ingredients are listed in the order of their amount by weight from highest to lowest. Food allergen labeling Food labels may also include a food allergen warning. Listed here are ingredients that can cause allergic reactions in some people. The potential allergens are listed behind the word  "Contains" or "May contain." Examples of ingredients that may be listed are wheat, dairy, eggs, soy, and nuts. If a person knows that he or she is allergic to one of these ingredients, he or she will know to avoid that food. Where to find more information:  [place] Food and Drug Administration: [place] This information is not intended to replace advice given to you by your health care provider. Make sure you discuss any questions you have with your health care provider. Document Released: 09/10/2005 Document Revised: 05/09/2016 Document Reviewed: 08/03/2013 Elsevier Interactive Patient Education  2017 [employer].

## 2017-04-16 NOTE — Assessment & Plan Note (Signed)
Hyzaar 

## 2017-04-17 ENCOUNTER — Ambulatory Visit (INDEPENDENT_AMBULATORY_CARE_PROVIDER_SITE_OTHER): Payer: PPO | Admitting: Podiatry

## 2017-04-17 ENCOUNTER — Encounter: Payer: Self-pay | Admitting: Podiatry

## 2017-04-17 DIAGNOSIS — E119 Type 2 diabetes mellitus without complications: Secondary | ICD-10-CM

## 2017-04-17 DIAGNOSIS — M79676 Pain in unspecified toe(s): Secondary | ICD-10-CM | POA: Diagnosis not present

## 2017-04-17 DIAGNOSIS — B351 Tinea unguium: Secondary | ICD-10-CM | POA: Diagnosis not present

## 2017-04-17 MED ORDER — AMMONIUM LACTATE 12 % EX CREA: TOPICAL_CREAM | CUTANEOUS | 0 refills | Status: DC | PRN

## 2017-04-17 NOTE — Progress Notes (Signed)
Complaint:  Visit Type: Patient returns to my office for continued preventative foot care services. Complaint: Patient states" my nails have grown long and thick and become painful to walk and wear shoes" Patient has been diagnosed with DM with no foot complications. The patient presents for preventative foot care services. No changes to ROS.  She had both great nails removed by myself 2 years ago,  Podiatric Exam: Vascular: dorsalis pedis and posterior tibial pulses are palpable bilateral. Capillary return is immediate. Temperature gradient is WNL. Skin turgor WNL  Sensorium: Normal Semmes Weinstein monofilament test. Normal tactile sensation bilaterally. Nail Exam: Pt has thick disfigured discolored nails with subungual debris noted bilateral entire nail second  through fifth toenails Ulcer Exam: There is no evidence of ulcer or pre-ulcerative changes or infection. Orthopedic Exam: Muscle tone and strength are WNL. No limitations in general ROM. No crepitus or effusions noted. Foot type and digits show no abnormalities. Bony prominences are unremarkable. Skin: No Porokeratosis. No infection or ulcers  Diagnosis:  Onychomycosis, , Pain in right toe, pain in left toes  Treatment & Plan Procedures and Treatment: Consent by patient was obtained for treatment procedures. The patient understood the discussion of treatment and procedures well. All questions were answered thoroughly reviewed. Debridement of mycotic and hypertrophic toenails, 1 through 5 bilateral and clearing of subungual debris. No ulceration, no infection noted. Prescribe lac-hydrin. Return Visit-Office Procedure: Patient instructed to return to the office for a follow up visit 3 months for continued evaluation and treatment.    Gardiner Barefoot DPM

## 2017-06-24 ENCOUNTER — Other Ambulatory Visit: Payer: Self-pay | Admitting: Internal Medicine

## 2017-06-24 DIAGNOSIS — I10 Essential (primary) hypertension: Secondary | ICD-10-CM

## 2017-07-02 ENCOUNTER — Encounter: Payer: Self-pay | Admitting: Internal Medicine

## 2017-07-18 ENCOUNTER — Other Ambulatory Visit: Payer: Self-pay | Admitting: Internal Medicine

## 2017-07-18 DIAGNOSIS — I10 Essential (primary) hypertension: Secondary | ICD-10-CM

## 2017-07-19 ENCOUNTER — Ambulatory Visit: Payer: PPO | Admitting: Internal Medicine

## 2017-07-19 DIAGNOSIS — H40033 Anatomical narrow angle, bilateral: Secondary | ICD-10-CM | POA: Diagnosis not present

## 2017-07-19 DIAGNOSIS — H18831 Recurrent erosion of cornea, right eye: Secondary | ICD-10-CM | POA: Diagnosis not present

## 2017-07-19 DIAGNOSIS — H1859 Other hereditary corneal dystrophies: Secondary | ICD-10-CM | POA: Diagnosis not present

## 2017-07-19 NOTE — Telephone Encounter (Signed)
Pt called checking on this refill. She said that she has been out of it for a week.

## 2017-07-23 ENCOUNTER — Ambulatory Visit: Payer: PPO | Admitting: Podiatry

## 2017-07-28 ENCOUNTER — Other Ambulatory Visit: Payer: Self-pay | Admitting: Internal Medicine

## 2017-07-28 DIAGNOSIS — I10 Essential (primary) hypertension: Secondary | ICD-10-CM

## 2017-08-01 ENCOUNTER — Other Ambulatory Visit: Payer: Self-pay | Admitting: Internal Medicine

## 2017-08-01 ENCOUNTER — Other Ambulatory Visit: Payer: Self-pay | Admitting: Podiatry

## 2017-08-01 DIAGNOSIS — I10 Essential (primary) hypertension: Secondary | ICD-10-CM

## 2017-08-02 ENCOUNTER — Other Ambulatory Visit: Payer: Self-pay | Admitting: Internal Medicine

## 2017-08-02 ENCOUNTER — Ambulatory Visit: Payer: PPO | Admitting: Nurse Practitioner

## 2017-08-02 ENCOUNTER — Encounter: Payer: Self-pay | Admitting: Nurse Practitioner

## 2017-08-02 VITALS — BP 174/72 | HR 72 | Temp 97.8°F | Resp 16 | Ht 63.0 in | Wt 200.0 lb

## 2017-08-02 DIAGNOSIS — J209 Acute bronchitis, unspecified: Secondary | ICD-10-CM | POA: Diagnosis not present

## 2017-08-02 DIAGNOSIS — I10 Essential (primary) hypertension: Secondary | ICD-10-CM

## 2017-08-02 MED ORDER — AZITHROMYCIN 250 MG PO TABS: ORAL_TABLET | ORAL | 0 refills | Status: DC

## 2017-08-02 MED ORDER — LORATADINE 10 MG PO TABS: 10.0000 mg | ORAL_TABLET | Freq: Every day | ORAL | 11 refills | Status: DC

## 2017-08-02 MED ORDER — HYDROCODONE-HOMATROPINE 5-1.5 MG/5ML PO SYRP: 5.0000 mL | ORAL_SOLUTION | Freq: Four times a day (QID) | ORAL | 0 refills | Status: DC | PRN

## 2017-08-02 NOTE — Patient Instructions (Addendum)
I have sent a prescription for a z-pak to your pharmacy. Please take antibiotic until complete.  I have provided you with hycodan for your cough. This medication can cause drowsiness.  Please rest and stay hydrated.  Please follow up for fevers over 101, if your symptoms get worse, or if you are not feeling better by Monday  I hope that you feel better. Thanks for letting me take care of you today :)

## 2017-08-02 NOTE — Progress Notes (Signed)
Subjective:    Patient ID: Rachel [name], female    DOB: 07/30/1941, 76 y.o.   MRN: [id number]  HPI Rachel [name] is a 76 yo female who presents today for an acute visit. Her chief complaint is cough. The cough began about a week and a half ago. The cough has been productive. shes been feeling progressively worse since the cough began. This week Rachel [name] has been experiencing body aches and fatigue. Rachel [name] is unable to sleep at night because of the cough and shes started hurting in her ribs every time Rachel [name] coughs. Rachel [name] reports right ear pain, sinus congestion, postnasal drip, sore throat. Rachel [name] denies fevers, chills, chest pain, shortness of breath.   Review of Systems  See HPI  Past Medical History:  Diagnosis Date  . Allergic rhinitis   . Diabetes mellitus, type 2 ([place])   . Hyperlipemia   . Hypertension   . Lipoma NEC    anterior upper chest  . Osteoarthritis of knee      Social History   Socioeconomic History  . Marital status: Married    Spouse name: Not on file  . Number of children: Not on file  . Years of education: Not on file  . Highest education level: Not on file  Social Needs  . Financial resource strain: Not on file  . Food insecurity - worry: Not on file  . Food insecurity - inability: Not on file  . Transportation needs - medical: Not on file  . Transportation needs - non-medical: Not on file  Occupational History  . Occupation: department mang    Employer: [employer]  Tobacco Use  . Smoking status: Never Smoker  . Smokeless tobacco: Never Used  Substance and Sexual Activity  . Alcohol use: No    Alcohol/week: 0.0 oz  . Drug use: No  . Sexual activity: No  Other Topics Concern  . Not on file  Social History Narrative   No regular exercise   Widowed    Past Surgical History:  Procedure Laterality Date  . ACNE CYST REMOVAL  1970   back  . APPENDECTOMY  1963  . OOPHORECTOMY  1985  . VAGINAL HYSTERECTOMY  1985   complete    Family History    Problem Relation Age of Onset  . Cancer Father        brain  . Diabetes Mother   . Heart attack Mother   . Diabetes Sister   . Diabetes Brother   . Hypertension Sister   . Hypertension Brother   . Aneurysm Other        niece  . Coronary artery disease Brother     Allergies  Allergen Reactions  . Aspirin Nausea And Vomiting    Uncoated aspirin Jittery GI upset  . Codeine Sulfate Nausea And Vomiting    Heart races.  . Glimepiride Diarrhea  . Metformin Diarrhea  . Propoxyphene N-Acetaminophen Nausea And Vomiting    Current Outpatient Medications on File Prior to Visit  Medication Sig Dispense Refill  . albuterol (PROAIR HFA) 108 (90 Base) MCG/ACT inhaler Inhale 1-2 puffs into the lungs every 6 (six) hours as needed for wheezing or shortness of breath. 1 Inhaler 1  . ammonium lactate (AMLACTIN) 12 % cream APPLY TOPICALLY AS NEEDED FOR DRY SKIN. 385 g 0  . aspirin 325 MG EC tablet Take 325 mg by mouth daily.    . blood glucose meter kit and supplies KIT Dispense based on patient and insurance preference.  Use before breakfast and at bedtime. ICD10: E11.00 1 each 1  . empagliflozin (JARDIANCE) 25 MG TABS tablet Take 25 mg by mouth daily. 90 tablet 3  . fluticasone (FLONASE) 50 MCG/ACT nasal spray Place 2 sprays into both nostrils daily. 16 g 1  . glucose blood test strip Dispense based on patient and insurance preference. Use before breakfast and at bedtime. ICD10: E11.00 100 each 12  . ibuprofen (ADVIL,MOTRIN) 600 MG tablet TAKE 1 TABLET (600 MG TOTAL) BY MOUTH 2 (TWO) TIMES DAILY AS NEEDED. 60 tablet 1  . Lancets MISC Dispense based on patient and insurance preference. Use before breakfast and at bedtime. ICD10: E11.00 100 each 12  . loratadine (CLARITIN) 10 MG tablet Take 1 tablet (10 mg total) by mouth daily. 30 tablet 11  . losartan-hydrochlorothiazide (HYZAAR) 100-25 MG tablet TAKE 1 TABLET BY MOUTH EVERY DAY 30 tablet 1  . montelukast (SINGULAIR) 10 MG tablet Take 1 tablet  (10 mg total) by mouth at bedtime. 30 tablet 3  . mupirocin ointment (BACTROBAN) 2 % Apply 1 application topically 2 (two) times daily. 22 g 0  . repaglinide (PRANDIN) 1 MG tablet Take 1 tablet (1 mg total) by mouth 3 (three) times daily before meals. 90 tablet 11   No current facility-administered medications on file prior to visit.     BP (!) 174/72 (BP Location: Left Arm, Patient Position: Sitting, Cuff Size: Large)   Pulse 72   Temp 97.8 F (36.6 C) (Oral)   Resp 16   Ht _0  (1.6 m)   Wt 200 lb (90.7 kg)   SpO2 98%   BMI 35.43 kg/m      Objective:   Physical Exam  Constitutional: Rachel [name] is oriented to person, place, and time. Rachel [name] appears well-developed and well-nourished. No distress.  HENT:  Head: Normocephalic and atraumatic.  Cardiovascular: Normal rate, regular rhythm, normal heart sounds and intact distal pulses.  Pulmonary/Chest: Effort normal and breath sounds normal. No respiratory distress. Rachel [name] has no wheezes.  Neurological: Rachel [name] is alert and oriented to person, place, and time. Coordination normal.  Skin: Skin is warm and dry.  Psychiatric: Rachel [name] has a normal mood and affect. Judgment and thought content normal.      Assessment & Plan:  Acute bronchitis, unspecified organism- will treat with antibiotics due to duration of cough, worsening of symptoms, malaise. zpak given with instructions to complete. Hycodan for cough and to help with sleep at night. Instructions not to operate machinery or drink alcohol when taking the hycodan as is may cause drowsiness. Rachel [name] will follow up for  fevers over 101, if symptoms get worse, or if not feeling better by Monday

## 2017-08-14 ENCOUNTER — Ambulatory Visit: Payer: PPO | Admitting: Podiatry

## 2017-09-12 ENCOUNTER — Other Ambulatory Visit: Payer: Self-pay

## 2017-09-12 MED ORDER — IBUPROFEN 600 MG PO TABS: 600.0000 mg | ORAL_TABLET | Freq: Two times a day (BID) | ORAL | 0 refills | Status: DC | PRN

## 2017-09-18 ENCOUNTER — Ambulatory Visit: Payer: PPO | Admitting: Podiatry

## 2017-09-18 ENCOUNTER — Encounter: Payer: Self-pay | Admitting: Podiatry

## 2017-09-18 DIAGNOSIS — M79676 Pain in unspecified toe(s): Secondary | ICD-10-CM

## 2017-09-18 DIAGNOSIS — E119 Type 2 diabetes mellitus without complications: Secondary | ICD-10-CM | POA: Diagnosis not present

## 2017-09-18 DIAGNOSIS — B351 Tinea unguium: Secondary | ICD-10-CM

## 2017-09-18 NOTE — Progress Notes (Signed)
Complaint:  Visit Type: Patient returns to my office for continued preventative foot care services. Complaint: Patient states" my nails have grown long and thick and become painful to walk and wear shoes" Patient has been diagnosed with DM with no foot complications. The patient presents for preventative foot care services. No changes to ROS.  She had both great nails removed by myself 2 years ago,  Podiatric Exam: Vascular: dorsalis pedis and posterior tibial pulses are palpable bilateral. Capillary return is immediate. Temperature gradient is WNL. Skin turgor WNL  Sensorium: Normal Semmes Weinstein monofilament test. Normal tactile sensation bilaterally. Nail Exam: Pt has thick disfigured discolored nails with subungual debris noted bilateral entire nail second  through fifth toenails Ulcer Exam: There is no evidence of ulcer or pre-ulcerative changes or infection. Orthopedic Exam: Muscle tone and strength are WNL. No limitations in general ROM. No crepitus or effusions noted. Foot type and digits show no abnormalities. Bony prominences are unremarkable. Skin: No Porokeratosis. No infection or ulcers  Diagnosis:  Onychomycosis, , Pain in right toe, pain in left toes  Treatment & Plan Procedures and Treatment: Consent by patient was obtained for treatment procedures. The patient understood the discussion of treatment and procedures well. All questions were answered thoroughly reviewed. Debridement of mycotic and hypertrophic toenails, 1 through 5 bilateral and clearing of subungual debris. No ulceration, no infection noted.  Return Visit-Office Procedure: Patient instructed to return to the office for a follow up visit 3 months for continued evaluation and treatment.    Gardiner Barefoot DPM

## 2017-10-28 ENCOUNTER — Ambulatory Visit: Payer: Self-pay | Admitting: *Deleted

## 2017-10-28 ENCOUNTER — Ambulatory Visit (INDEPENDENT_AMBULATORY_CARE_PROVIDER_SITE_OTHER): Payer: PPO | Admitting: Family

## 2017-10-28 ENCOUNTER — Encounter: Payer: Self-pay | Admitting: Family

## 2017-10-28 VITALS — BP 150/78 | HR 72 | Temp 97.9°F | Ht 63.0 in | Wt 202.1 lb

## 2017-10-28 DIAGNOSIS — J45909 Unspecified asthma, uncomplicated: Secondary | ICD-10-CM | POA: Diagnosis not present

## 2017-10-28 DIAGNOSIS — I10 Essential (primary) hypertension: Secondary | ICD-10-CM | POA: Diagnosis not present

## 2017-10-28 DIAGNOSIS — E1165 Type 2 diabetes mellitus with hyperglycemia: Secondary | ICD-10-CM

## 2017-10-28 MED ORDER — DOXYCYCLINE HYCLATE 100 MG PO TABS
100.0000 mg | ORAL_TABLET | Freq: Two times a day (BID) | ORAL | 0 refills | Status: DC
Start: 2017-10-28 — End: 2018-01-03

## 2017-10-28 MED ORDER — PREDNISONE 20 MG PO TABS: 20.0000 mg | ORAL_TABLET | Freq: Every day | ORAL | 0 refills | Status: DC

## 2017-10-28 NOTE — Telephone Encounter (Addendum)
Pt's daughter Santiago Glad calling stating that the pt "can't catch her breath". Pt's daughter states that the pt is short of breath with talking and at rest.  Pt was seen for an office visit today with Rosann Auerbach for asthmatic bronchitis. Pt's daughter states that the pt had a nebulizer treatment in the office and had 2 puffs of the inhaler while pt's daughter was calling the office. Pt's daughter states that the pt had 3 additional asthma attacks since being home from the office. Pt advised to go to ED for further treatment of current symptoms.  Reason for Disposition . [1] SEVERE asthma attack (e.g., very SOB at rest, speaks in single words, loud wheezes) AND [2] not resolved after 2 nebulizer or inhaler treatments  Answer Assessment - Initial Assessment Questions 1. RESPIRATORY STATUS: "Describe your breathing?" (e.g., wheezing, shortness of breath, unable to speak, severe coughing)      Shortness of breath, having difficulty talking 2. ONSET: "When did this asthma attack begin?"      Pt's daughter states that she had 3 asthma attacks since coming from the office today at 1pm 3. TRIGGER: "What do you think triggered this attack?" (e.g., URI, exposure to pollen or other allergen, tobacco smoke)      Not assessed 4. PEAK EXPIRATORY FLOW RATE (PEFR): "Do you use a peak flow meter?" If so, ask: "What's the current peak flow? What's your personal best peak flow?"      Not assessed 5. SEVERITY: "How bad is this attack?"    - MILD: No SOB at rest, mild SOB with walking, speaks normally in sentences, can lay down, no retractions, pulse < 100. (GREEN Zone: PEFR 80-100%)   - MODERATE: SOB at rest, SOB with minimal exertion and prefers to sit, cannot lie down flat, speaks in phrases, mild retractions, audible wheezing, pulse 100-120. (YELLOW Zone: PEFR 50-80%)    - SEVERE: Very SOB at rest, speaks in single words, struggling to breathe, sitting hunched forward, retractions, usually loud wheezing, sometimes  minimal wheezing because of decreased air movement, pulse > 120. (RED Zone: PEFR < 50%).      severe 6. MEDICATIONS (Inhaler or nebs): "What are your asthma medications?" and "What treatments have you given so far?"    - Quick-relief: albuterol, metaproterenol, salbutamol, or other inhaled or nebulized beta-agonist medicines   - Long-term-control: steroids, cromolyn, or other anti-inflammatory medicines.    albuterol inhaler- 2 puffs approximately 20min ago 7. OTHER SYMPTOMS: "Do you have any other symptoms? (e.g., runny nose, chest pain, fever)     No  Protocols used: ASTHMA ATTACK-A-AH

## 2017-10-28 NOTE — Patient Instructions (Signed)
Please get back on your blood pressure medication

## 2017-10-28 NOTE — Progress Notes (Signed)
Rachel [name] is a 77 y.o. female with the following history as recorded in [employer]:  Patient Active Problem List   Diagnosis Date Noted  . Well adult exam 12/14/2015  . Acute UTI 12/14/2015  . 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 Bacterial pharyngitis 08/25/2014  . Acute bronchitis 07/03/2014  . Oral thrush 07/03/2014  . Onychomycosis 02/18/2013  . URI (upper respiratory infection) 07/11/2012  . Cough 07/11/2012  . Ganglion cyst 06/25/2011  . Foot pain, left 06/25/2011  . Otitis media 06/25/2011  . LIPOMA 11/03/2010  . EUSTACHIAN TUBE DYSFUNCTION, RIGHT 02/18/2010  . Asthma with acute exacerbation 11/02/2009  . ONYCHOMYCOSIS, TOENAILS 05/18/2009  . DM2 (diabetes mellitus, type 2) ([place]) 04/19/2007  . 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 Essential hypertension 04/19/2007  . Allergic rhinitis 04/19/2007  . OSTEOARTHRITIS 04/19/2007    Current Outpatient Medications  Medication Sig Dispense Refill  . albuterol (PROAIR HFA) 108 (90 Base) MCG/ACT inhaler Inhale 1-2 puffs into the lungs every 6 (six) hours as needed for wheezing or shortness of breath. 1 Inhaler 1  . ammonium lactate (AMLACTIN) 12 % cream APPLY TOPICALLY AS NEEDED FOR DRY SKIN. 385 g 0  . aspirin 325 MG EC tablet Take 325 mg by mouth daily.    . blood glucose meter kit and supplies KIT Dispense based on patient and insurance preference. Use before breakfast and at bedtime. ICD10: E11.00 1 each 1  . empagliflozin (JARDIANCE) 25 MG TABS tablet Take 25 mg by mouth daily. 90 tablet 3  . fluticasone (FLONASE) 50 MCG/ACT nasal spray Place 2 sprays into both nostrils daily. 16 g 1  . glucose blood test strip Dispense based on patient and insurance preference. Use before breakfast and at bedtime. ICD10: E11.00 100 each 12  . ibuprofen (ADVIL,MOTRIN) 600 MG tablet Take 1 tablet (600 mg total) by mouth 2 (two) times daily as needed. Patient needs office visit before refills will be given 60 tablet 0  . Lancets MISC Dispense based on  patient and insurance preference. Use before breakfast and at bedtime. ICD10: E11.00 100 each 12  . loratadine (CLARITIN) 10 MG tablet Take 1 tablet (10 mg total) daily by mouth. 30 tablet 11  . mupirocin ointment (BACTROBAN) 2 % Apply 1 application topically 2 (two) times daily. 22 g 0  . repaglinide (PRANDIN) 1 MG tablet Take 1 tablet (1 mg total) by mouth 3 (three) times daily before meals. 90 tablet 11  . doxycycline (VIBRA-TABS) 100 MG tablet Take 1 tablet (100 mg total) by mouth 2 (two) times daily. 20 tablet 0  . losartan-hydrochlorothiazide (HYZAAR) 100-25 MG tablet TAKE 1 TABLET BY MOUTH EVERY DAY (Patient not taking: Reported on 10/28/2017) 30 tablet 1  . montelukast (SINGULAIR) 10 MG tablet Take 1 tablet (10 mg total) by mouth at bedtime. (Patient not taking: Reported on 10/28/2017) 30 tablet 3  . predniSONE (DELTASONE) 20 MG tablet Take 1 tablet (20 mg total) by mouth daily with breakfast. 5 tablet 0   No current facility-administered medications for this visit.     Allergies: Aspirin; Codeine sulfate; Glimepiride; Metformin; and Propoxyphene n-acetaminophen  Past Medical History:  Diagnosis Date  . Allergic rhinitis   . Diabetes mellitus, type 2 ([place])   . Hyperlipemia   . Hypertension   . Lipoma NEC    anterior upper chest  . Osteoarthritis of knee     Past Surgical History:  Procedure Laterality Date  . ACNE CYST REMOVAL  1970   back  . APPENDECTOMY  1963  .  OOPHORECTOMY  1985  . VAGINAL HYSTERECTOMY  1985   complete    Family History  Problem Relation Age of Onset  . Cancer Father        brain  . Diabetes Mother   . Heart attack Mother   . Diabetes Sister   . Diabetes Brother   . Hypertension Sister   . Hypertension Brother   . Aneurysm Other        niece  . Coronary artery disease Brother     Social History   Tobacco Use  . Smoking status: Never Smoker  . Smokeless tobacco: Never Used  Substance Use Topics  . Alcohol use: No    Alcohol/week: 0.0 oz     Subjective:  Started with cough/ congestion last Friday; feels like she is "very congested." Does have albuterol inhaler and has used occasionally; feels like right sinuses are swollen; denies any fever; Admits that blood pressure is elevated today because she is not taking her blood pressure medication; saw that Losartan was involved in national recall and just decided to stop her medicine; Denies any chest pain, blurred vision or headache. Also has Type 2 diabetes; does not check her blood sugar regularly; last Hgba1c done in July 2018; did not understand she was due to come back in 06/2017;     Objective:  Vitals:   10/28/17 1336  BP: (!) 150/78  Pulse: 72  Temp: 97.9 F (36.6 C)  TempSrc: Oral  SpO2: 99%  Weight: 202 lb 1.3 oz (91.7 kg)  Height: '5\' 3"'$  (1.6 m)    General: Well developed, well nourished, in no acute distress  Skin : Warm and dry.  Head: Normocephalic and atraumatic  Eyes: Sclera and conjunctiva clear; pupils round and reactive to light; extraocular movements intact  Ears: External normal; canals clear; tympanic membranes normal  Oropharynx: Pink, supple. No suspicious lesions  Neck: Supple without thyromegaly, adenopathy  Lungs: Respirations unlabored; wheezing in all 4 lobes; improved after nebulizer treatment given;  CVS exam: normal rate and regular rhythm.  Musculoskeletal: No deformities; no active joint inflammation  Extremities: No edema, cyanosis, clubbing  Vessels: Symmetric bilaterally  Neurologic: Alert and oriented; speech intact; face symmetrical; moves all extremities well; CNII-XII intact without focal deficit   1. Acute asthmatic bronchitis   2. Essential hypertension   3. Uncontrolled type 2 diabetes mellitus with hyperglycemia ([place])     Plan:  1. Albuterol nebulizer treatment given in office with relief; Rx for Doxycycline and prednisone; after 5 days of prednisone, start Dulera 200 (sample given today); suspect patient needs daily  preventive medication for asthma; follow-up with her PCP in 2 weeks; 2. Uncontrolled; get back on medication; reassurance regarding Losartan recall; 3. Needs to see her PCP in follow-up; was due for Hgba1c in October 2018;  Return in about 2 weeks (around 11/11/2017) for with Dr. Alain Marion.  No orders of the defined types were placed in this encounter.   Requested Prescriptions   Signed Prescriptions Disp Refills  . doxycycline (VIBRA-TABS) 100 MG tablet 20 tablet 0    Sig: Take 1 tablet (100 mg total) by mouth 2 (two) times daily.  . predniSONE (DELTASONE) 20 MG tablet 5 tablet 0    Sig: Take 1 tablet (20 mg total) by mouth daily with breakfast.

## 2017-10-29 NOTE — Telephone Encounter (Signed)
FYI

## 2017-11-11 ENCOUNTER — Ambulatory Visit: Payer: PPO | Admitting: Internal Medicine

## 2017-11-13 ENCOUNTER — Ambulatory Visit (INDEPENDENT_AMBULATORY_CARE_PROVIDER_SITE_OTHER)
Admission: RE | Admit: 2017-11-13 | Discharge: 2017-11-13 | Disposition: A | Discharge status: Home / Self Care | Payer: PPO | Source: Ambulatory Visit | Attending: Family | Admitting: Family

## 2017-11-13 ENCOUNTER — Other Ambulatory Visit (INDEPENDENT_AMBULATORY_CARE_PROVIDER_SITE_OTHER): Payer: PPO

## 2017-11-13 ENCOUNTER — Encounter: Payer: Self-pay | Admitting: Family

## 2017-11-13 ENCOUNTER — Ambulatory Visit (INDEPENDENT_AMBULATORY_CARE_PROVIDER_SITE_OTHER): Payer: PPO | Admitting: Family

## 2017-11-13 VITALS — BP 148/78 | HR 72 | Temp 97.8°F | Ht 63.0 in | Wt 201.1 lb

## 2017-11-13 DIAGNOSIS — I10 Essential (primary) hypertension: Secondary | ICD-10-CM

## 2017-11-13 DIAGNOSIS — R059 Cough, unspecified: Secondary | ICD-10-CM

## 2017-11-13 DIAGNOSIS — R05 Cough: Secondary | ICD-10-CM

## 2017-11-13 DIAGNOSIS — E1140 Type 2 diabetes mellitus with diabetic neuropathy, unspecified: Secondary | ICD-10-CM

## 2017-11-13 DIAGNOSIS — J4541 Moderate persistent asthma with (acute) exacerbation: Secondary | ICD-10-CM

## 2017-11-13 LAB — COMPREHENSIVE METABOLIC PANEL
ALK PHOS: 57 U/L (ref 39–117)
ALT: 16 U/L (ref 0–35)
AST: 17 U/L (ref 0–37)
Albumin: 3.9 g/dL (ref 3.5–5.2)
BUN: 17 mg/dL (ref 6–23)
CO2: 26 mEq/L (ref 19–32)
Calcium: 9.8 mg/dL (ref 8.4–10.5)
Chloride: 100 mEq/L (ref 96–112)
Creatinine, Ser: 0.79 mg/dL (ref 0.40–1.20)
GFR: 91.44 mL/min (ref >60.00)
GLUCOSE: 177 mg/dL — AB (ref 70–99)
POTASSIUM: 4.3 meq/L (ref 3.5–5.1)
Sodium: 135 mEq/L (ref 135–145)
TOTAL PROTEIN: 7.9 g/dL (ref 6.0–8.3)
Total Bilirubin: 0.6 mg/dL (ref 0.2–1.2)

## 2017-11-13 LAB — CBC WITH DIFFERENTIAL/PLATELET
Basophils Absolute: 0.1 10*3/uL (ref 0.0–0.1)
Basophils Relative: 1.4 % (ref 0.0–3.0)
EOS PCT: 1.4 % (ref 0.0–5.0)
Eosinophils Absolute: 0.2 10*3/uL (ref 0.0–0.7)
HCT: 37.7 % (ref 36.0–46.0)
Hemoglobin: 12.6 g/dL (ref 12.0–15.0)
LYMPHS ABS: 4.1 10*3/uL — AB (ref 0.7–4.0)
Lymphocytes Relative: 39.4 % (ref 12.0–46.0)
MCHC: 33.6 g/dL (ref 30.0–36.0)
MCV: 84.8 fl (ref 78.0–100.0)
MONOS PCT: 5.2 % (ref 3.0–12.0)
Monocytes Absolute: 0.5 10*3/uL (ref 0.1–1.0)
NEUTROS ABS: 5.5 10*3/uL (ref 1.4–7.7)
NEUTROS PCT: 52.6 % (ref 43.0–77.0)
PLATELETS: 329 10*3/uL (ref 150.0–400.0)
RBC: 4.44 Mil/uL (ref 3.87–5.11)
RDW: 14.4 % (ref 11.5–15.5)
WBC: 10.5 10*3/uL (ref 4.0–10.5)

## 2017-11-13 LAB — POCT GLYCOSYLATED HEMOGLOBIN (HGB A1C): Hemoglobin A1C: 9.9

## 2017-11-13 MED ORDER — PREDNISONE 20 MG PO TABS: 20.0000 mg | ORAL_TABLET | Freq: Every day | ORAL | 0 refills | Status: DC

## 2017-11-13 MED ORDER — REPAGLINIDE 1 MG PO TABS: 1.0000 mg | ORAL_TABLET | Freq: Three times a day (TID) | ORAL | 3 refills | Status: DC

## 2017-11-13 MED ORDER — IPRATROPIUM-ALBUTEROL 0.5-2.5 (3) MG/3ML IN SOLN: 3.0000 mL | Freq: Once | RESPIRATORY_TRACT | Status: DC

## 2017-11-13 MED ORDER — EMPAGLIFLOZIN 25 MG PO TABS: 25.0000 mg | ORAL_TABLET | Freq: Every day | ORAL | 3 refills | Status: DC

## 2017-11-13 MED ORDER — BUDESONIDE-FORMOTEROL FUMARATE 160-4.5 MCG/ACT IN AERO: 2.0000 | INHALATION_SPRAY | Freq: Two times a day (BID) | RESPIRATORY_TRACT | 3 refills | Status: DC

## 2017-11-13 NOTE — Progress Notes (Signed)
Rachel [name] is a 77 y.o. female with the following history as recorded in EpicCare:  Patient Active Problem List   Diagnosis Date Noted  . Well adult exam 12/14/2015  . Acute UTI 12/14/2015  . 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 Bacterial pharyngitis 08/25/2014  . Acute bronchitis 07/03/2014  . Oral thrush 07/03/2014  . Onychomycosis 02/18/2013  . URI (upper respiratory infection) 07/11/2012  . Cough 07/11/2012  . Ganglion cyst 06/25/2011  . Foot pain, left 06/25/2011  . Otitis media 06/25/2011  . LIPOMA 11/03/2010  . EUSTACHIAN TUBE DYSFUNCTION, RIGHT 02/18/2010  . Asthma with acute exacerbation 11/02/2009  . ONYCHOMYCOSIS, TOENAILS 05/18/2009  . DM2 (diabetes mellitus, type 2) ([place]) 04/19/2007  . 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 Essential hypertension 04/19/2007  . Allergic rhinitis 04/19/2007  . OSTEOARTHRITIS 04/19/2007    Current Outpatient Medications  Medication Sig Dispense Refill  . albuterol (PROAIR HFA) 108 (90 Base) MCG/ACT inhaler Inhale 1-2 puffs into the lungs every 6 (six) hours as needed for wheezing or shortness of breath. 1 Inhaler 1  . ammonium lactate (AMLACTIN) 12 % cream APPLY TOPICALLY AS NEEDED FOR DRY SKIN. 385 g 0  . aspirin 325 MG EC tablet Take 325 mg by mouth daily.    . blood glucose meter kit and supplies KIT Dispense based on patient and insurance preference. Use before breakfast and at bedtime. ICD10: E11.00 1 each 1  . doxycycline (VIBRA-TABS) 100 MG tablet Take 1 tablet (100 mg total) by mouth 2 (two) times daily. 20 tablet 0  . empagliflozin (JARDIANCE) 25 MG TABS tablet Take 25 mg by mouth daily. 90 tablet 3  . fluticasone (FLONASE) 50 MCG/ACT nasal spray Place 2 sprays into both nostrils daily. 16 g 1  . glucose blood test strip Dispense based on patient and insurance preference. Use before breakfast and at bedtime. ICD10: E11.00 100 each 12  . ibuprofen (ADVIL,MOTRIN) 600 MG tablet Take 1 tablet (600 mg total) by mouth 2 (two) times daily  as needed. Patient needs office visit before refills will be given 60 tablet 0  . Lancets MISC Dispense based on patient and insurance preference. Use before breakfast and at bedtime. ICD10: E11.00 100 each 12  . loratadine (CLARITIN) 10 MG tablet Take 1 tablet (10 mg total) daily by mouth. 30 tablet 11  . losartan-hydrochlorothiazide (HYZAAR) 100-25 MG tablet TAKE 1 TABLET BY MOUTH EVERY DAY 30 tablet 1  . montelukast (SINGULAIR) 10 MG tablet Take 1 tablet (10 mg total) by mouth at bedtime. 30 tablet 3  . mupirocin ointment (BACTROBAN) 2 % Apply 1 application topically 2 (two) times daily. 22 g 0  . predniSONE (DELTASONE) 20 MG tablet Take 1 tablet (20 mg total) by mouth daily with breakfast. 5 tablet 0  . repaglinide (PRANDIN) 1 MG tablet Take 1 tablet (1 mg total) by mouth 3 (three) times daily before meals. 90 tablet 3  . budesonide-formoterol (SYMBICORT) 160-4.5 MCG/ACT inhaler Inhale 2 puffs into the lungs 2 (two) times daily. 1 Inhaler 3   Current Facility-Administered Medications  Medication Dose Route Frequency Provider Last Rate Last Dose  . ipratropium-albuterol (DUONEB) 0.5-2.5 (3) MG/3ML nebulizer solution 3 mL  3 mL Nebulization Once Marrian Salvage, FNP        Allergies: Aspirin; Codeine sulfate; Glimepiride; Metformin; and Propoxyphene n-acetaminophen  Past Medical History:  Diagnosis Date  . Allergic rhinitis   . Diabetes mellitus, type 2 ([place])   . Hyperlipemia   . Hypertension   . Lipoma NEC  anterior upper chest  . Osteoarthritis of knee     Past Surgical History:  Procedure Laterality Date  . ACNE CYST REMOVAL  1970   back  . APPENDECTOMY  1963  . OOPHORECTOMY  1985  . VAGINAL HYSTERECTOMY  1985   complete    Family History  Problem Relation Age of Onset  . Cancer Father        brain  . Diabetes Mother   . Heart attack Mother   . Diabetes Sister   . Diabetes Brother   . Hypertension Sister   . Hypertension Brother   . Aneurysm Other         niece  . Coronary artery disease Brother     Social History   Tobacco Use  . Smoking status: Never Smoker  . Smokeless tobacco: Never Used  Substance Use Topics  . Alcohol use: No    Alcohol/week: 0.0 oz    Subjective:  2 week follow up on chronic care needs: 1) Uncontrolled asthma- notes she had been feeling good after completing Rx for doxycycline and prednisone given at last office visit; did not understand that she was supposed to be taking the [facility] sample given at last office visit everyday to help treat/ control her asthma; felt like she had an asthma attack yesterday and again today on way to office;  Tried using her rescue inhaler today but did not feel any better;  2) Type 2 Diabetes- uncontrolled; denies any concerns for low blood sugar; admits she has not ever started the jardiance that was prescribed; does not remember the last time she took her Prandin; thought she could control it with "diet alone." Does not want to take any type of shot at this time; allergic to Metformin , Glipizide and aspirin;  3) Hypertension- did re-start her blood pressure medication as asked at last office visit; does not check regularly; feels like asthma attack she had today could be causing some of the elevation seen today;  Objective:  Vitals:   11/13/17 1304  BP: (!) 148/78  Pulse: 72  Temp: 97.8 F (36.6 C)  TempSrc: Oral  SpO2: 98%  Weight: 201 lb 1.9 oz (91.2 kg)  Height: _0  (1.6 m)    General: Well developed, well nourished, in no acute distress  Skin : Warm and dry.  Head: Normocephalic and atraumatic  Eyes: Sclera and conjunctiva clear; pupils round and reactive to light; extraocular movements intact  Ears: External normal; canals clear; tympanic membranes normal  Oropharynx: Pink, supple. No suspicious lesions  Neck: Supple without thyromegaly, adenopathy  Lungs: Respirations unlabored; wheezing on initial exam- improved after Duo-neb given in office; ? Mild allergic  reaction to Duo-neb though- progressed into coughing fits and was eventually started on oxygen with resolution of symptoms CVS exam: normal rate and regular rhythm.  Neurologic: Alert and oriented; speech intact; face symmetrical; moves all extremities well; CNII-XII intact without focal deficit  Assessment:  1. Moderate persistent asthma with acute exacerbation   2. Cough   3. Type 2 diabetes mellitus with diabetic neuropathy, without long-term current use of insulin ([place])   4. Essential hypertension     Plan:  1. Check D-dimer, CXR today; will re-treat with 5 days of prednisone and stressed need to take her daily inhaler ( Symbicort) everyday as prescribed to prevent asthma attacks; she expresses understanding. 3. Patient's Hgba1c today is 9.9; she admits she has not been taking her medication; long discussion about treatment options and need to  take medication as prescribed; she agrees to re-start both medications; refills updated; follow-up with her PCP in 1 month; 4. ? Control or if elevation due to asthma attack today; EKG shows NSR today; re-check in 1 month.   No Follow-up on file.  Orders Placed This Encounter  Procedures  . DG Chest 2 View    Standing Status:   Future    Standing Expiration Date:   01/12/2019    Order Specific Question:   Reason for Exam (SYMPTOM  OR DIAGNOSIS REQUIRED)    Answer:   cough    Order Specific Question:   Preferred imaging location?    Answer:   [facility]    Order Specific Question:   Radiology Contrast Protocol - do NOT remove file path    Answer:   [email or web address]  . CBC w/Diff    Standing Status:   Future    Standing Expiration Date:   11/13/2018  . Comp Met (CMET)    Standing Status:   Future    Standing Expiration Date:   11/13/2018  . D-Dimer, Quantitative    Standing Status:   Future    Standing Expiration Date:   11/13/2018  . POCT glycosylated hemoglobin (Hb A1C)  . EKG 12-Lead     Requested Prescriptions   Signed Prescriptions Disp Refills  . predniSONE (DELTASONE) 20 MG tablet 5 tablet 0    Sig: Take 1 tablet (20 mg total) by mouth daily with breakfast.  . budesonide-formoterol (SYMBICORT) 160-4.5 MCG/ACT inhaler 1 Inhaler 3    Sig: Inhale 2 puffs into the lungs 2 (two) times daily.  . repaglinide (PRANDIN) 1 MG tablet 90 tablet 3    Sig: Take 1 tablet (1 mg total) by mouth 3 (three) times daily before meals.  . empagliflozin (JARDIANCE) 25 MG TABS tablet 90 tablet 3    Sig: Take 25 mg by mouth daily.

## 2017-11-14 LAB — D-DIMER, QUANTITATIVE: D-Dimer, Quant: 0.48 mcg/mL FEU (ref <0.50)

## 2017-11-15 ENCOUNTER — Other Ambulatory Visit: Payer: Self-pay | Admitting: Family

## 2017-11-15 DIAGNOSIS — I517 Cardiomegaly: Secondary | ICD-10-CM

## 2017-11-18 ENCOUNTER — Telehealth: Payer: Self-pay | Admitting: Internal Medicine

## 2017-11-18 ENCOUNTER — Other Ambulatory Visit: Payer: Self-pay | Admitting: Family

## 2017-11-18 DIAGNOSIS — I517 Cardiomegaly: Secondary | ICD-10-CM

## 2017-11-18 NOTE — Telephone Encounter (Signed)
Routing to Mickel Baas to do referral for patient.  Per message patient is feeling much better and would like to proceed with referral.

## 2017-11-18 NOTE — Telephone Encounter (Signed)
11/18/17  Pt is returning the office call and that she is feeling better. Also she would like the referral to be put in.  Please f/u with pt.  Copied from [place]. Topic: Quick Communication - Office Called Patient >> Nov 18, 2017  8:46 AM Rachel [name], CMA wrote: Reason for CRM: Called patient this morning and was able to get her voicemail today. Left message today regarding info from Mickel Baas and should she return call to office results may be released. Per Mickel Baas her labs were stable. She should be taking medication for diabetes. On the CXR, her heart looked very slightly enlarged ( this is probably due to her history of high blood pressure)  however, Mickel Baas would like to have her see cardiology just to make sure she doesn't need a medication change; and would like to know if she is okay with referral?   I also asked that she contact office to let us know how she was doing and how her breathing was doing.

## 2017-11-19 NOTE — Telephone Encounter (Signed)
Referral completed by Mickel Baas on yesterday for patient to see cardiologist.

## 2017-11-22 ENCOUNTER — Encounter: Payer: Self-pay | Admitting: Family

## 2017-11-24 ENCOUNTER — Other Ambulatory Visit: Payer: Self-pay | Admitting: Internal Medicine

## 2017-11-24 DIAGNOSIS — I10 Essential (primary) hypertension: Secondary | ICD-10-CM

## 2017-11-26 ENCOUNTER — Ambulatory Visit: Payer: Self-pay | Admitting: *Deleted

## 2017-11-26 NOTE — Telephone Encounter (Signed)
  Pt called because she would like a cough med called into the pharmacy. She is having a hard time at night because the cough is keeping her from resting. She denies having a fever. She thinks her cold is better but the cough is lingering.  Home care advise given to her with verbal understanding.   Reason for Disposition . Cough  Answer Assessment - Initial Assessment Questions 1. ONSET: "When did the cough begin?"      Since February 2. SEVERITY: "How bad is the cough today?"      Non productive worst at night 3. RESPIRATORY DISTRESS: "Describe your breathing."      A little short of breath,  4. FEVER: "Do you have a fever?" If so, ask: "What is your temperature, how was it measured, and when did it start?"     no 5. HEMOPTYSIS: "Are you coughing up any blood?" If so ask: "How much?" (flecks, streaks, tablespoons, etc.)     no 6. TREATMENT: "What have you done so far to treat the cough?" (e.g., meds, fluids, humidifier)     Humidifier, fluids no med for the cough 7. CARDIAC HISTORY: "Do you have any history of heart disease?" (e.g., heart attack, congestive heart failure)      no 8. LUNG HISTORY: "Do you have any history of lung disease?"  (e.g., pulmonary embolus, asthma, emphysema)     asthma 9. PE RISK FACTORS: "Do you have a history of blood clots?" (or: recent major surgery, recent prolonged travel, bedridden )     no 10. OTHER SYMPTOMS: "Do you have any other symptoms? (e.g., runny nose, wheezing, chest pain)       Sniffles every once and a while 11. PREGNANCY: "Is there any chance you are pregnant?" "When was your last menstrual period?"       no 12. TRAVEL: "Have you traveled out of the country in the last month?" (e.g., travel history, exposures)       no  Protocols used: COUGH - ACUTE NON-PRODUCTIVE-A-AH

## 2017-11-26 NOTE — Telephone Encounter (Signed)
Please advise 

## 2017-11-26 NOTE — Telephone Encounter (Signed)
Pt can use over-the-counter  "cold" medicines  such as "Tylenol cold" , "Advil cold",  "Mucinex" or" Mucinex D"  for cough and congestion.   Avoid decongestants if you have high blood pressure and use "Afrin" nasal spray for nasal congestion as directed. Use " Delsym" or" Robitussin" cough syrup varietis for cough.  You can use plain "Tylenol" or "Advil" for fever, chills and achyness. Use Halls or Ricola cough drops.   "Common cold" symptoms are usually triggered by a virus.  The antibiotics are usually not necessary. On average, a" viral cold" illness would take 4-7 days to resolve.   Please, make an appointment if you are not better or if you're worse.

## 2017-11-28 NOTE — Telephone Encounter (Signed)
LM notifying pt

## 2017-12-04 ENCOUNTER — Telehealth: Payer: Self-pay | Admitting: Internal Medicine

## 2017-12-04 NOTE — Telephone Encounter (Signed)
Copied from [place] [id number]. Topic: Quick Communication - See Telephone Encounter >> Dec 04, 2017  4:35 PM Bea Graff, NT wrote: CRM for notification. See Telephone encounter for: Pt calling to let Jodi Mourning know that she has been trying to contact [facility] and has been unable to get them on the phone to schedule her appt. She wanted to see if the office could try to get her appointment set up with them?   12/04/17.

## 2017-12-05 NOTE — Telephone Encounter (Signed)
Pt is scheduled and left msg with her son for her to call me back at [phone]

## 2017-12-18 ENCOUNTER — Ambulatory Visit: Payer: PPO | Admitting: Podiatry

## 2017-12-25 ENCOUNTER — Ambulatory Visit: Payer: PPO | Admitting: Cardiovascular Disease

## 2018-01-03 ENCOUNTER — Ambulatory Visit: Payer: PPO | Admitting: Podiatry

## 2018-01-03 ENCOUNTER — Ambulatory Visit: Payer: PPO | Admitting: Cardiovascular Disease

## 2018-01-03 ENCOUNTER — Encounter: Payer: Self-pay | Admitting: Cardiovascular Disease

## 2018-01-03 VITALS — BP 184/69 | HR 75 | Ht 63.0 in | Wt 202.0 lb

## 2018-01-03 DIAGNOSIS — I519 Heart disease, unspecified: Secondary | ICD-10-CM

## 2018-01-03 DIAGNOSIS — E7800 Pure hypercholesterolemia, unspecified: Secondary | ICD-10-CM

## 2018-01-03 DIAGNOSIS — I517 Cardiomegaly: Secondary | ICD-10-CM | POA: Diagnosis not present

## 2018-01-03 NOTE — Patient Instructions (Signed)

## 2018-01-03 NOTE — Assessment & Plan Note (Signed)
History of essential hypertension blood pressure measurements at 184/69.She is on losartan and hydrochlorothiazide. Continue current meds. Dosing.

## 2018-01-03 NOTE — Assessment & Plan Note (Signed)
History of mild hyperlipidemia not on statin therapy

## 2018-01-03 NOTE — Progress Notes (Signed)
01/03/2018 Rachel [name]   [birth year]  [id number]  Primary Physician Plotnikov, Evie Lacks, MD Primary Cardiologist: Lorretta Harp MD Lupe Carney, [place]  HPI:  Rachel [name] is a 77 y.o. mildly overweight widowed African-American female mother of 2, grandmother and 5 grandchildren referred by [place] [facility] nurse practitioner for evaluation of mild cardiomegaly. She has a history of mild untreated hyperlipidemia and hypertension. Her mother did have CAD. She has never had a heart attack or stroke and denies chest pain or shortness of breath. She does have some reactive airways disease on occasion. Recent chest x-ray performed 11/13/17 showed mild cardiomegaly.   Current Meds  Medication Sig  . albuterol (PROAIR HFA) 108 (90 Base) MCG/ACT inhaler Inhale 1-2 puffs into the lungs every 6 (six) hours as needed for wheezing or shortness of breath.  [name] ammonium lactate (AMLACTIN) 12 % cream APPLY TOPICALLY AS NEEDED FOR DRY SKIN.  . blood glucose meter kit and supplies KIT Dispense based on patient and insurance preference. Use before breakfast and at bedtime. ICD10: E11.00  . budesonide-formoterol (SYMBICORT) 160-4.5 MCG/ACT inhaler Inhale 2 puffs into the lungs 2 (two) times daily.  . fluticasone (FLONASE) 50 MCG/ACT nasal spray Place 2 sprays into both nostrils daily.  [name] glucose blood test strip Dispense based on patient and insurance preference. Use before breakfast and at bedtime. ICD10: E11.00  . ibuprofen (ADVIL,MOTRIN) 600 MG tablet Take 1 tablet (600 mg total) by mouth 2 (two) times daily as needed. Patient needs office visit before refills will be given  . Lancets MISC Dispense based on patient and insurance preference. Use before breakfast and at bedtime. ICD10: E11.00  . loratadine (CLARITIN) 10 MG tablet Take 1 tablet (10 mg total) daily by mouth.  . losartan-hydrochlorothiazide (HYZAAR) 100-25 MG tablet TAKE 1 TABLET BY MOUTH EVERY DAY  . repaglinide (PRANDIN) 1 MG  tablet Take 1 tablet (1 mg total) by mouth 3 (three) times daily before meals.  . [DISCONTINUED] aspirin 325 MG EC tablet Take 325 mg by mouth daily.  . [DISCONTINUED] doxycycline (VIBRA-TABS) 100 MG tablet Take 1 tablet (100 mg total) by mouth 2 (two) times daily.  . [DISCONTINUED] empagliflozin (JARDIANCE) 25 MG TABS tablet Take 25 mg by mouth daily.  . [DISCONTINUED] montelukast (SINGULAIR) 10 MG tablet Take 1 tablet (10 mg total) by mouth at bedtime.  . [DISCONTINUED] mupirocin ointment (BACTROBAN) 2 % Apply 1 application topically 2 (two) times daily.  . [DISCONTINUED] predniSONE (DELTASONE) 20 MG tablet Take 1 tablet (20 mg total) by mouth daily with breakfast.   Current Facility-Administered Medications for the 01/03/18 encounter (Office Visit) with Lorretta Harp, MD  Medication  . ipratropium-albuterol (DUONEB) 0.5-2.5 (3) MG/3ML nebulizer solution 3 mL     Allergies  Allergen Reactions  . Aspirin Nausea And Vomiting    Uncoated aspirin Jittery GI upset  . Codeine Sulfate Nausea And Vomiting    Heart races.  . Glimepiride Diarrhea  . Metformin Diarrhea  . Propoxyphene N-Acetaminophen Nausea And Vomiting    Social History   Socioeconomic History  . Marital status: Married    Spouse name: Not on file  . Number of children: Not on file  . Years of education: Not on file  . Highest education level: Not on file  Occupational History  . Occupation: department mang    Employer: [employer]  Social Needs  . Financial resource strain: Not on file  . Food insecurity:    Worry: Not  on file    Inability: Not on file  . Transportation needs:    Medical: Not on file    Non-medical: Not on file  Tobacco Use  . Smoking status: Never Smoker  . Smokeless tobacco: Never Used  Substance and Sexual Activity  . Alcohol use: No    Alcohol/week: 0.0 oz  . Drug use: No  . Sexual activity: Never  Lifestyle  . Physical activity:    Days per week: Not on file    Minutes  per session: Not on file  . Stress: Not on file  Relationships  . Social connections:    Talks on phone: Not on file    Gets together: Not on file    Attends religious service: Not on file    Active member of club or organization: Not on file    Attends meetings of clubs or organizations: Not on file    Relationship status: Not on file  . Intimate partner violence:    Fear of current or ex partner: Not on file    Emotionally abused: Not on file    Physically abused: Not on file    Forced sexual activity: Not on file  Other Topics Concern  . Not on file  Social History Narrative   No regular exercise   Widowed     Review of Systems: General: negative for chills, fever, night sweats or weight changes.  Cardiovascular: negative for chest pain, dyspnea on exertion, edema, orthopnea, palpitations, paroxysmal nocturnal dyspnea or shortness of breath Dermatological: negative for rash Respiratory: negative for cough or wheezing Urologic: negative for hematuria Abdominal: negative for nausea, vomiting, diarrhea, bright red blood per rectum, melena, or hematemesis Neurologic: negative for visual changes, syncope, or dizziness All other systems reviewed and are otherwise negative except as noted above.    Blood pressure (!) 184/69, pulse 75, height '5\' 3"'$  (1.6 m), weight 202 lb (91.6 kg).  General appearance: alert and no distress Neck: no adenopathy, no carotid bruit, no JVD, supple, symmetrical, trachea midline and thyroid not enlarged, symmetric, no tenderness/mass/nodules Lungs: clear to auscultation bilaterally Heart: regular rate and rhythm, S1, S2 normal, no murmur, click, rub or gallop Extremities: extremities normal, atraumatic, no cyanosis or edema Pulses: 2+ and symmetric Skin: Skin color, texture, turgor normal. No rashes or lesions Neurologic: Alert and oriented X 3, normal strength and tone. Normal symmetric reflexes. Normal coordination and gait  EKG not performed  today  ASSESSMENT AND PLAN:   Essential hypertension History of essential hypertension blood pressure measurements at 184/69.She is on losartan and hydrochlorothiazide. Continue current meds. Dosing.  Hyperlipidemia History of mild hyperlipidemia not on statin therapy  Mild cardiomegaly Ms Truxillo  was referred to me by Edythe Clarity nurse practitioner for mild cardiomegaly seen on routine chest x-ray performed 11/13/17.we will check a 2-D echo to further evaluate.      Lorretta Harp MD FACP,FACC,FAHA, [facility] 01/03/2018 3:06 PM

## 2018-01-03 NOTE — Assessment & Plan Note (Signed)
Rachel [name]  was referred to me by Edythe Clarity nurse practitioner for mild cardiomegaly seen on routine chest x-ray performed 11/13/17.we will check a 2-D echo to further evaluate.

## 2018-01-09 ENCOUNTER — Ambulatory Visit (HOSPITAL_COMMUNITY): Payer: PPO | Attending: Cardiology

## 2018-01-09 ENCOUNTER — Other Ambulatory Visit: Payer: Self-pay

## 2018-01-09 DIAGNOSIS — I119 Hypertensive heart disease without heart failure: Secondary | ICD-10-CM | POA: Insufficient documentation

## 2018-01-09 DIAGNOSIS — E785 Hyperlipidemia, unspecified: Secondary | ICD-10-CM | POA: Insufficient documentation

## 2018-01-09 DIAGNOSIS — E119 Type 2 diabetes mellitus without complications: Secondary | ICD-10-CM | POA: Diagnosis not present

## 2018-01-09 DIAGNOSIS — I519 Heart disease, unspecified: Secondary | ICD-10-CM | POA: Diagnosis not present

## 2018-01-09 DIAGNOSIS — I340 Nonrheumatic mitral (valve) insufficiency: Secondary | ICD-10-CM | POA: Insufficient documentation

## 2018-02-11 ENCOUNTER — Other Ambulatory Visit: Payer: Self-pay | Admitting: Internal Medicine

## 2018-03-14 ENCOUNTER — Other Ambulatory Visit: Payer: Self-pay | Admitting: Podiatry

## 2018-04-05 ENCOUNTER — Other Ambulatory Visit: Payer: Self-pay | Admitting: Internal Medicine

## 2018-04-05 DIAGNOSIS — I10 Essential (primary) hypertension: Secondary | ICD-10-CM

## 2018-06-03 ENCOUNTER — Ambulatory Visit (INDEPENDENT_AMBULATORY_CARE_PROVIDER_SITE_OTHER): Payer: PPO | Admitting: Family

## 2018-06-03 ENCOUNTER — Encounter: Payer: Self-pay | Admitting: Family

## 2018-06-03 DIAGNOSIS — J4531 Mild persistent asthma with (acute) exacerbation: Secondary | ICD-10-CM | POA: Diagnosis not present

## 2018-06-03 DIAGNOSIS — H6981 Other specified disorders of Eustachian tube, right ear: Secondary | ICD-10-CM

## 2018-06-03 MED ORDER — FLUTICASONE PROPIONATE 50 MCG/ACT NA SUSP: 2.0000 | Freq: Every day | NASAL | 1 refills | Status: DC

## 2018-06-03 MED ORDER — DOXYCYCLINE HYCLATE 100 MG PO TABS: 100.0000 mg | ORAL_TABLET | Freq: Two times a day (BID) | ORAL | 0 refills | Status: DC

## 2018-06-03 MED ORDER — PREDNISONE 20 MG PO TABS: 20.0000 mg | ORAL_TABLET | Freq: Every day | ORAL | 0 refills | Status: DC

## 2018-06-03 MED ORDER — ALBUTEROL SULFATE HFA 108 (90 BASE) MCG/ACT IN AERS: 1.0000 | INHALATION_SPRAY | Freq: Four times a day (QID) | RESPIRATORY_TRACT | 1 refills | Status: DC | PRN

## 2018-06-03 NOTE — Progress Notes (Signed)
Rachel [name] is a 77 y.o. female with the following history as recorded in [employer]:  Patient Active Problem List   Diagnosis Date Noted  . Mild cardiomegaly 01/03/2018  . Well adult exam 12/14/2015  . Acute UTI 12/14/2015  . 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 Bacterial pharyngitis 08/25/2014  . Acute bronchitis 07/03/2014  . Oral thrush 07/03/2014  . Onychomycosis 02/18/2013  . URI (upper respiratory infection) 07/11/2012  . Cough 07/11/2012  . Ganglion cyst 06/25/2011  . Foot pain, left 06/25/2011  . Otitis media 06/25/2011  . LIPOMA 11/03/2010  . EUSTACHIAN TUBE DYSFUNCTION, RIGHT 02/18/2010  . Asthma with acute exacerbation 11/02/2009  . ONYCHOMYCOSIS, TOENAILS 05/18/2009  . Hyperlipidemia 09/15/2008  . DM2 (diabetes mellitus, type 2) ([place]) 04/19/2007  . 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 Essential hypertension 04/19/2007  . Allergic rhinitis 04/19/2007  . OSTEOARTHRITIS 04/19/2007    Current Outpatient Medications  Medication Sig Dispense Refill  . albuterol (PROAIR HFA) 108 (90 Base) MCG/ACT inhaler Inhale 1-2 puffs into the lungs every 6 (six) hours as needed for wheezing or shortness of breath. 1 Inhaler 1  . ammonium lactate (AMLACTIN) 12 % cream APPLY TOPICALLY AS NEEDED FOR DRY SKIN. 385 g 0  . blood glucose meter kit and supplies KIT Dispense based on patient and insurance preference. Use before breakfast and at bedtime. ICD10: E11.00 1 each 1  . fluticasone (FLONASE) 50 MCG/ACT nasal spray Place 2 sprays into both nostrils daily. 16 g 1  . fluticasone (FLOVENT HFA) 44 MCG/ACT inhaler Inhale into the lungs 2 (two) times daily.    [name] glucose blood test strip Dispense based on patient and insurance preference. Use before breakfast and at bedtime. ICD10: E11.00 100 each 12  . ibuprofen (ADVIL,MOTRIN) 600 MG tablet TAKE 1 TABLET (600 MG TOTAL) BY MOUTH 2 (TWO) TIMES DAILY AS NEEDED 60 tablet 1  . Lancets MISC Dispense based on patient and insurance preference. Use before  breakfast and at bedtime. ICD10: E11.00 100 each 12  . loratadine (CLARITIN) 10 MG tablet Take 1 tablet (10 mg total) daily by mouth. 30 tablet 11  . losartan-hydrochlorothiazide (HYZAAR) 100-25 MG tablet TAKE 1 TABLET BY MOUTH EVERY DAY 90 tablet 3  . repaglinide (PRANDIN) 1 MG tablet Take 1 tablet (1 mg total) by mouth 3 (three) times daily before meals. 90 tablet 3  . doxycycline (VIBRA-TABS) 100 MG tablet Take 1 tablet (100 mg total) by mouth 2 (two) times daily. 20 tablet 0  . predniSONE (DELTASONE) 20 MG tablet Take 1 tablet (20 mg total) by mouth daily with breakfast. 5 tablet 0   Current Facility-Administered Medications  Medication Dose Route Frequency Provider Last Rate Last Dose  . ipratropium-albuterol (DUONEB) 0.5-2.5 (3) MG/3ML nebulizer solution 3 mL  3 mL Nebulization Once Marrian Salvage, FNP        Allergies: Aspirin; Codeine sulfate; Glimepiride; Metformin; and Propoxyphene n-acetaminophen  Past Medical History:  Diagnosis Date  . Allergic rhinitis   . Diabetes mellitus, type 2 ([place])   . Hyperlipemia   . Hypertension   . Lipoma NEC    anterior upper chest  . Osteoarthritis of knee     Past Surgical History:  Procedure Laterality Date  . ACNE CYST REMOVAL  1970   back  . APPENDECTOMY  1963  . OOPHORECTOMY  1985  . VAGINAL HYSTERECTOMY  1985   complete    Family History  Problem Relation Age of Onset  . Cancer Father  brain  . Diabetes Mother   . Heart attack Mother   . Diabetes Sister   . Diabetes Brother   . Hypertension Sister   . Hypertension Brother   . Aneurysm Other        niece  . Coronary artery disease Brother     Social History   Tobacco Use  . Smoking status: Never Smoker  . Smokeless tobacco: Never Used  Substance Use Topics  . Alcohol use: No    Alcohol/week: 0.0 standard drinks    Subjective:  Patient presents with concerns for asthma attack/ bronchitis; symptoms started last week; admits that stress level has been  high- her sister passed away in the past week; feels that her asthma is worse when her allergies are flared up; she admits she does not have active prescription for albuterol but shows me a Flovent inhaler she keeps in her purse. Is also well overdue to see her PCP for follow up on her chronic care needs;   Objective:  Vitals:   06/03/18 1158  BP: (!) 148/62  Pulse: 70  Temp: 98.1 F (36.7 C)  TempSrc: Oral  SpO2: 98%  Weight: 203 lb 3.2 oz (92.2 kg)  Height: _0  (1.6 m)    General: Well developed, well nourished, in no acute distress  Skin : Warm and dry.  Head: Normocephalic and atraumatic  Eyes: Sclera and conjunctiva clear; pupils round and reactive to light; extraocular movements intact  Ears: External normal; canals clear; tympanic membranes normal  Oropharynx: Pink, supple. No suspicious lesions  Neck: Supple without thyromegaly, adenopathy  Lungs: Respirations unlabored; wheezing noted on exam; CVS exam: normal rate and regular rhythm.  Neurologic: Alert and oriented; speech intact; face symmetrical; moves all extremities well; CNII-XII intact without focal deficit   Assessment:  1. Mild persistent asthma with acute exacerbation   2. Dysfunction of right eustachian tube     Plan:  Rx for Doxycycline 100 mg bid x 10 days and Prednisone 20 mg qd x 5 days; stressed need to use her Flovent daily for prevention and albuterol as rescue; increase fluids, rest and follow-up worse, no better. Refill on Flonase also; Work note for today and tomorrow;  Return for Needs a follow up with Dr. Alain Marion.  No orders of the defined types were placed in this encounter.   Requested Prescriptions   Signed Prescriptions Disp Refills  . albuterol (PROAIR HFA) 108 (90 Base) MCG/ACT inhaler 1 Inhaler 1    Sig: Inhale 1-2 puffs into the lungs every 6 (six) hours as needed for wheezing or shortness of breath.  . predniSONE (DELTASONE) 20 MG tablet 5 tablet 0    Sig: Take 1 tablet (20 mg  total) by mouth daily with breakfast.  . doxycycline (VIBRA-TABS) 100 MG tablet 20 tablet 0    Sig: Take 1 tablet (100 mg total) by mouth 2 (two) times daily.  . fluticasone (FLONASE) 50 MCG/ACT nasal spray 16 g 1    Sig: Place 2 sprays into both nostrils daily.

## 2018-06-24 ENCOUNTER — Encounter: Payer: Self-pay | Admitting: Internal Medicine

## 2018-06-24 ENCOUNTER — Ambulatory Visit (INDEPENDENT_AMBULATORY_CARE_PROVIDER_SITE_OTHER): Payer: PPO | Admitting: Internal Medicine

## 2018-06-24 DIAGNOSIS — J4531 Mild persistent asthma with (acute) exacerbation: Secondary | ICD-10-CM

## 2018-06-24 DIAGNOSIS — J209 Acute bronchitis, unspecified: Secondary | ICD-10-CM

## 2018-06-24 DIAGNOSIS — E1140 Type 2 diabetes mellitus with diabetic neuropathy, unspecified: Secondary | ICD-10-CM | POA: Diagnosis not present

## 2018-06-24 MED ORDER — CANAGLIFLOZIN 100 MG PO TABS: 100.0000 mg | ORAL_TABLET | Freq: Every day | ORAL | 11 refills | Status: DC

## 2018-06-24 MED ORDER — METHYLPREDNISOLONE ACETATE 80 MG/ML IJ SUSP
80.0000 mg | Freq: Once | INTRAMUSCULAR | Status: AC
  Administered 2018-06-24: 80 mg via INTRAMUSCULAR

## 2018-06-24 MED ORDER — BUDESONIDE-FORMOTEROL FUMARATE 160-4.5 MCG/ACT IN AERO: 2.0000 | INHALATION_SPRAY | Freq: Two times a day (BID) | RESPIRATORY_TRACT | 3 refills | Status: DC

## 2018-06-24 MED ORDER — CEFDINIR 300 MG PO CAPS: 300.0000 mg | ORAL_CAPSULE | Freq: Two times a day (BID) | ORAL | 0 refills | Status: DC

## 2018-06-24 NOTE — Assessment & Plan Note (Signed)
Changed to Symbicort - d/c Flovent Proair prn I personally provided inhaler w/spacer use teaching. After the teaching patient was able to demonstrate it's use more effectively w/a spacer (provided). All questions were answered Depomedrol IM

## 2018-06-24 NOTE — Assessment & Plan Note (Signed)
Start Invokana Continue Prandin

## 2018-06-24 NOTE — Progress Notes (Signed)
Subjective:  Patient ID: Rachel [name], female    DOB: 10-07-1940  Age: 77 y.o. MRN: [id number]  CC: No chief complaint on file.   HPI Rachel [name] presents for asthma and ears and nose congestion. Laura's note was reviewed. The pt finished Rx. She saw ENT - ok. C/o R side nose bleeds... F/u DM On Flovent and Proair MDIs  Outpatient Medications Prior to Visit  Medication Sig Dispense Refill  . albuterol (PROAIR HFA) 108 (90 Base) MCG/ACT inhaler Inhale 1-2 puffs into the lungs every 6 (six) hours as needed for wheezing or shortness of breath. 1 Inhaler 1  . ammonium lactate (AMLACTIN) 12 % cream APPLY TOPICALLY AS NEEDED FOR DRY SKIN. 385 g 0  . blood glucose meter kit and supplies KIT Dispense based on patient and insurance preference. Use before breakfast and at bedtime. ICD10: E11.00 1 each 1  . doxycycline (VIBRA-TABS) 100 MG tablet Take 1 tablet (100 mg total) by mouth 2 (two) times daily. 20 tablet 0  . fluticasone (FLONASE) 50 MCG/ACT nasal spray Place 2 sprays into both nostrils daily. 16 g 1  . fluticasone (FLOVENT HFA) 44 MCG/ACT inhaler Inhale into the lungs 2 (two) times daily.    [name] glucose blood test strip Dispense based on patient and insurance preference. Use before breakfast and at bedtime. ICD10: E11.00 100 each 12  . ibuprofen (ADVIL,MOTRIN) 600 MG tablet TAKE 1 TABLET (600 MG TOTAL) BY MOUTH 2 (TWO) TIMES DAILY AS NEEDED 60 tablet 1  . Lancets MISC Dispense based on patient and insurance preference. Use before breakfast and at bedtime. ICD10: E11.00 100 each 12  . loratadine (CLARITIN) 10 MG tablet Take 1 tablet (10 mg total) daily by mouth. 30 tablet 11  . losartan-hydrochlorothiazide (HYZAAR) 100-25 MG tablet TAKE 1 TABLET BY MOUTH EVERY DAY 90 tablet 3  . predniSONE (DELTASONE) 20 MG tablet Take 1 tablet (20 mg total) by mouth daily with breakfast. 5 tablet 0  . repaglinide (PRANDIN) 1 MG tablet Take 1 tablet (1 mg total) by mouth 3 (three) times daily  before meals. 90 tablet 3   Facility-Administered Medications Prior to Visit  Medication Dose Route Frequency Provider Last Rate Last Dose  . ipratropium-albuterol (DUONEB) 0.5-2.5 (3) MG/3ML nebulizer solution 3 mL  3 mL Nebulization Once Marrian Salvage, FNP        ROS: Review of Systems  Constitutional: Positive for fatigue. Negative for activity change, appetite change, chills and unexpected weight change.  HENT: Positive for congestion, nosebleeds, postnasal drip, rhinorrhea, sinus pressure and sinus pain. Negative for mouth sores.   Eyes: Negative for visual disturbance.  Respiratory: Positive for cough, shortness of breath and wheezing. Negative for chest tightness.   Cardiovascular: Positive for leg swelling.  Gastrointestinal: Negative for abdominal pain and nausea.  Genitourinary: Negative for difficulty urinating, frequency and vaginal pain.  Musculoskeletal: Positive for arthralgias. Negative for back pain and gait problem.  Skin: Negative for pallor and rash.  Neurological: Negative for dizziness, tremors, weakness, numbness and headaches.  Psychiatric/Behavioral: Negative for confusion and sleep disturbance.    Objective:  BP 138/66 (BP Location: Left Arm, Patient Position: Sitting, Cuff Size: Large)   Pulse 75   Temp 97.8 F (36.6 C) (Oral)   Ht '5\' 3"'$  (1.6 m)   Wt 205 lb (93 kg)   SpO2 96%   BMI 36.31 kg/m   BP Readings from Last 3 Encounters:  06/24/18 138/66  06/03/18 (!) 148/62  01/03/18 (!) 184/69  Wt Readings from Last 3 Encounters:  06/24/18 205 lb (93 kg)  06/03/18 203 lb 3.2 oz (92.2 kg)  01/03/18 202 lb (91.6 kg)    Physical Exam  Constitutional: She appears well-developed. No distress.  HENT:  Head: Normocephalic.  Right Ear: External ear normal.  Left Ear: External ear normal.  Nose: Nose normal.  Mouth/Throat: Oropharynx is clear and moist.  Eyes: Pupils are equal, round, and reactive to light. Conjunctivae are normal. Right eye  exhibits no discharge. Left eye exhibits no discharge.  Neck: Normal range of motion. Neck supple. No JVD present. No tracheal deviation present. No thyromegaly present.  Cardiovascular: Normal rate, regular rhythm and normal heart sounds.  Pulmonary/Chest: No stridor. No respiratory distress. She has no wheezes.  Abdominal: Soft. Bowel sounds are normal. She exhibits no distension and no mass. There is no tenderness. There is no rebound and no guarding.  Musculoskeletal: She exhibits edema and tenderness.  Lymphadenopathy:    She has no cervical adenopathy.  Neurological: She displays normal reflexes. No cranial nerve deficit. She exhibits normal muscle tone. Coordination normal.  Skin: No rash noted. No erythema.  Psychiatric: She has a normal mood and affect. Her behavior is normal. Judgment and thought content normal.  R TM - fluid R nare - bloody mucus Trace LE edema L>R  I personally provided inhaler w/spacer use teaching. After the teaching patient was able to demonstrate it's use more effectively w/a spacer (provided). All questions were answered    Lab Results  Component Value Date   WBC 10.5 11/13/2017   HGB 12.6 11/13/2017   HCT 37.7 11/13/2017   PLT 329.0 11/13/2017   GLUCOSE 177 (H) 11/13/2017   CHOL 212 (H) 12/12/2015   TRIG 146.0 12/12/2015   HDL 47.10 12/12/2015   LDLDIRECT 150.2 03/14/2011   LDLCALC 135 (H) 12/12/2015   ALT 16 11/13/2017   AST 17 11/13/2017   NA 135 11/13/2017   K 4.3 11/13/2017   CL 100 11/13/2017   CREATININE 0.79 11/13/2017   BUN 17 11/13/2017   CO2 26 11/13/2017   TSH 2.58 12/12/2015   HGBA1C 9.9 11/13/2017   MICROALBUR 1.8 12/12/2015    Dg Chest 2 View  Result Date: 11/13/2017 CLINICAL DATA:  77 y/o  F; cough, wheeze, and dyspnea for 2 weeks. EXAM: CHEST  2 VIEW COMPARISON:  07/09/2016 chest radiograph FINDINGS: Borderline cardiomegaly. No consolidation, effusion, or pneumothorax. Mild multilevel discogenic degenerative changes of  the thoracic spine. IMPRESSION: Borderline cardiomegaly.  No acute pulmonary process identified. Electronically Signed   By: Kristine Garbe M.D.   On: 11/13/2017 15:45    Assessment & Plan:   There are no diagnoses linked to this encounter.   No orders of the defined types were placed in this encounter.    Follow-up: No follow-ups on file.  Walker Kehr, MD

## 2018-06-24 NOTE — Patient Instructions (Addendum)
Stop Flovent Start Symbicort 2 puffs twice a day  Start Invokana 100 mg daily

## 2018-06-25 ENCOUNTER — Ambulatory Visit: Payer: PPO | Admitting: Podiatry

## 2018-07-14 ENCOUNTER — Ambulatory Visit: Payer: PPO | Admitting: Podiatry

## 2018-08-06 ENCOUNTER — Encounter: Payer: Self-pay | Admitting: Internal Medicine

## 2018-08-06 ENCOUNTER — Ambulatory Visit (INDEPENDENT_AMBULATORY_CARE_PROVIDER_SITE_OTHER): Payer: PPO | Admitting: Internal Medicine

## 2018-08-06 DIAGNOSIS — J4521 Mild intermittent asthma with (acute) exacerbation: Secondary | ICD-10-CM

## 2018-08-06 DIAGNOSIS — J45909 Unspecified asthma, uncomplicated: Secondary | ICD-10-CM | POA: Insufficient documentation

## 2018-08-06 DIAGNOSIS — E1140 Type 2 diabetes mellitus with diabetic neuropathy, unspecified: Secondary | ICD-10-CM

## 2018-08-06 DIAGNOSIS — J302 Other seasonal allergic rhinitis: Secondary | ICD-10-CM

## 2018-08-06 MED ORDER — DAPAGLIFLOZIN PROPANEDIOL 5 MG PO TABS: 5.0000 mg | ORAL_TABLET | Freq: Every day | ORAL | 11 refills | Status: DC

## 2018-08-06 MED ORDER — PROMETHAZINE-CODEINE 6.25-10 MG/5ML PO SYRP: 5.0000 mL | ORAL_SOLUTION | ORAL | 0 refills | Status: DC | PRN

## 2018-08-06 MED ORDER — BUDESONIDE-FORMOTEROL FUMARATE 160-4.5 MCG/ACT IN AERO: 2.0000 | INHALATION_SPRAY | Freq: Two times a day (BID) | RESPIRATORY_TRACT | 3 refills | Status: DC

## 2018-08-06 MED ORDER — METHYLPREDNISOLONE ACETATE 80 MG/ML IJ SUSP
80.0000 mg | Freq: Once | INTRAMUSCULAR | Status: AC
  Administered 2018-08-06: 80 mg via INTRAMUSCULAR

## 2018-08-06 MED ORDER — CEFDINIR 300 MG PO CAPS: 300.0000 mg | ORAL_CAPSULE | Freq: Two times a day (BID) | ORAL | 0 refills | Status: DC

## 2018-08-06 NOTE — Progress Notes (Signed)
Subjective:  Patient ID: Rachel [name], female    DOB: 11/29/1940  Age: 77 y.o. MRN: [id number]  CC: No chief complaint on file.   HPI Rachel [name] presents for URI; asthma is worse F/u DM - refused Invokana  Outpatient Medications Prior to Visit  Medication Sig Dispense Refill  . albuterol (PROAIR HFA) 108 (90 Base) MCG/ACT inhaler Inhale 1-2 puffs into the lungs every 6 (six) hours as needed for wheezing or shortness of breath. 1 Inhaler 1  . ammonium lactate (AMLACTIN) 12 % cream APPLY TOPICALLY AS NEEDED FOR DRY SKIN. 385 g 0  . blood glucose meter kit and supplies KIT Dispense based on patient and insurance preference. Use before breakfast and at bedtime. ICD10: E11.00 1 each 1  . budesonide-formoterol (SYMBICORT) 160-4.5 MCG/ACT inhaler Inhale 2 puffs into the lungs 2 (two) times daily. 3 Inhaler 3  . canagliflozin (INVOKANA) 100 MG TABS tablet Take 1 tablet (100 mg total) by mouth daily before breakfast. 30 tablet 11  . cefdinir (OMNICEF) 300 MG capsule Take 1 capsule (300 mg total) by mouth 2 (two) times daily. 28 capsule 0  . fluticasone (FLONASE) 50 MCG/ACT nasal spray Place 2 sprays into both nostrils daily. 16 g 1  . glucose blood test strip Dispense based on patient and insurance preference. Use before breakfast and at bedtime. ICD10: E11.00 100 each 12  . ibuprofen (ADVIL,MOTRIN) 600 MG tablet TAKE 1 TABLET (600 MG TOTAL) BY MOUTH 2 (TWO) TIMES DAILY AS NEEDED 60 tablet 1  . Lancets MISC Dispense based on patient and insurance preference. Use before breakfast and at bedtime. ICD10: E11.00 100 each 12  . loratadine (CLARITIN) 10 MG tablet Take 1 tablet (10 mg total) daily by mouth. 30 tablet 11  . losartan-hydrochlorothiazide (HYZAAR) 100-25 MG tablet TAKE 1 TABLET BY MOUTH EVERY DAY 90 tablet 3  . repaglinide (PRANDIN) 1 MG tablet Take 1 tablet (1 mg total) by mouth 3 (three) times daily before meals. 90 tablet 3   Facility-Administered Medications Prior to  Visit  Medication Dose Route Frequency Provider Last Rate Last Dose  . ipratropium-albuterol (DUONEB) 0.5-2.5 (3) MG/3ML nebulizer solution 3 mL  3 mL Nebulization Once Marrian Salvage, FNP        ROS: Review of Systems  Constitutional: Negative for activity change, appetite change, chills, fatigue and unexpected weight change.  HENT: Negative for congestion, mouth sores and sinus pressure.   Eyes: Negative for visual disturbance.  Respiratory: Positive for shortness of breath and wheezing. Negative for cough and chest tightness.   Gastrointestinal: Negative for abdominal pain and nausea.  Genitourinary: Negative for difficulty urinating, frequency and vaginal pain.  Musculoskeletal: Negative for back pain and gait problem.  Skin: Negative for pallor and rash.  Neurological: Negative for dizziness, tremors, weakness, numbness and headaches.  Psychiatric/Behavioral: Negative for confusion, sleep disturbance and suicidal ideas.    Objective:  BP 140/78 (BP Location: Left Arm, Patient Position: Sitting, Cuff Size: Normal)   Pulse 74   Temp 97.9 F (36.6 C) (Oral)   Ht '5\' 3"'$  (1.6 m)   Wt 202 lb (91.6 kg)   SpO2 98%   BMI 35.78 kg/m   BP Readings from Last 3 Encounters:  08/06/18 140/78  06/24/18 138/66  06/03/18 (!) 148/62    Wt Readings from Last 3 Encounters:  08/06/18 202 lb (91.6 kg)  06/24/18 205 lb (93 kg)  06/03/18 203 lb 3.2 oz (92.2 kg)    Physical Exam  Constitutional: She  appears well-developed. No distress.  HENT:  Head: Normocephalic.  Right Ear: External ear normal.  Left Ear: External ear normal.  Nose: Nose normal.  Mouth/Throat: Oropharynx is clear and moist.  Eyes: Pupils are equal, round, and reactive to light. Conjunctivae are normal. Right eye exhibits no discharge. Left eye exhibits no discharge.  Neck: Normal range of motion. Neck supple. No JVD present. No tracheal deviation present. No thyromegaly present.  Cardiovascular: Normal rate,  regular rhythm and normal heart sounds.  Pulmonary/Chest: No stridor. No respiratory distress. She has no wheezes.  Abdominal: Soft. Bowel sounds are normal. She exhibits no distension and no mass. There is no tenderness. There is no rebound and no guarding.  Musculoskeletal: She exhibits no edema or tenderness.  Lymphadenopathy:    She has no cervical adenopathy.  Neurological: She displays normal reflexes. No cranial nerve deficit. She exhibits normal muscle tone. Coordination normal.  Skin: No rash noted. No erythema.  Psychiatric: She has a normal mood and affect. Her behavior is normal. Judgment and thought content normal.  eryth nasal mucosa  Lab Results  Component Value Date   WBC 10.5 11/13/2017   HGB 12.6 11/13/2017   HCT 37.7 11/13/2017   PLT 329.0 11/13/2017   GLUCOSE 177 (H) 11/13/2017   CHOL 212 (H) 12/12/2015   TRIG 146.0 12/12/2015   HDL 47.10 12/12/2015   LDLDIRECT 150.2 03/14/2011   LDLCALC 135 (H) 12/12/2015   ALT 16 11/13/2017   AST 17 11/13/2017   NA 135 11/13/2017   K 4.3 11/13/2017   CL 100 11/13/2017   CREATININE 0.79 11/13/2017   BUN 17 11/13/2017   CO2 26 11/13/2017   TSH 2.58 12/12/2015   HGBA1C 9.9 11/13/2017   MICROALBUR 1.8 12/12/2015    Dg Chest 2 View  Result Date: 11/13/2017 CLINICAL DATA:  77 y/o  F; cough, wheeze, and dyspnea for 2 weeks. EXAM: CHEST  2 VIEW COMPARISON:  07/09/2016 chest radiograph FINDINGS: Borderline cardiomegaly. No consolidation, effusion, or pneumothorax. Mild multilevel discogenic degenerative changes of the thoracic spine. IMPRESSION: Borderline cardiomegaly.  No acute pulmonary process identified. Electronically Signed   By: Kristine Garbe M.D.   On: 11/13/2017 15:45    Assessment & Plan:   There are no diagnoses linked to this encounter.   No orders of the defined types were placed in this encounter.    Follow-up: No follow-ups on file.  Walker Kehr, MD

## 2018-08-06 NOTE — Patient Instructions (Signed)
Afrin nasal spray as needed for congestion

## 2018-08-06 NOTE — Assessment & Plan Note (Signed)
Invokana -- refused Continue Prandin. Try Wilder Glade if covered

## 2018-08-06 NOTE — Addendum Note (Signed)
Addended by: Karren Cobble on: 08/06/2018 04:52 PM   Modules accepted: Orders

## 2018-08-06 NOTE — Assessment & Plan Note (Signed)
Symbicort Depo-medrol IM

## 2018-08-06 NOTE — Assessment & Plan Note (Signed)
Afrin prn

## 2018-08-27 ENCOUNTER — Other Ambulatory Visit: Payer: Self-pay | Admitting: Nurse Practitioner

## 2018-09-30 ENCOUNTER — Ambulatory Visit (INDEPENDENT_AMBULATORY_CARE_PROVIDER_SITE_OTHER): Payer: PPO | Admitting: Family

## 2018-09-30 ENCOUNTER — Encounter: Payer: Self-pay | Admitting: Family

## 2018-09-30 VITALS — BP 148/78 | HR 77 | Temp 97.8°F | Ht 63.0 in | Wt 207.1 lb

## 2018-09-30 DIAGNOSIS — J019 Acute sinusitis, unspecified: Secondary | ICD-10-CM

## 2018-09-30 MED ORDER — AMOXICILLIN-POT CLAVULANATE 875-125 MG PO TABS: 1.0000 | ORAL_TABLET | Freq: Two times a day (BID) | ORAL | 0 refills | Status: DC

## 2018-09-30 NOTE — Progress Notes (Signed)
Rachel [name] is a 78 y.o. female with the following history as recorded in EpicCare:  Patient Active Problem List   Diagnosis Date Noted  . 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 Mild cardiomegaly 01/03/2018  . Well adult exam 12/14/2015  . Acute UTI 12/14/2015  . 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 Bacterial pharyngitis 08/25/2014  . Acute bronchitis 07/03/2014  . Oral thrush 07/03/2014  . Onychomycosis 02/18/2013  . URI (upper respiratory infection) 07/11/2012  . Cough 07/11/2012  . Ganglion cyst 06/25/2011  . Foot pain, left 06/25/2011  . Otitis media 06/25/2011  . LIPOMA 11/03/2010  . EUSTACHIAN TUBE DYSFUNCTION, RIGHT 02/18/2010  . Asthma with acute exacerbation 11/02/2009  . ONYCHOMYCOSIS, TOENAILS 05/18/2009  . Hyperlipidemia 09/15/2008  . DM2 (diabetes mellitus, type 2) ([place]) 04/19/2007  . 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 Essential hypertension 04/19/2007  . Allergic rhinitis 04/19/2007  . OSTEOARTHRITIS 04/19/2007    Current Outpatient Medications  Medication Sig Dispense Refill  . albuterol (PROAIR HFA) 108 (90 Base) MCG/ACT inhaler Inhale 1-2 puffs into the lungs every 6 (six) hours as needed for wheezing or shortness of breath. 1 Inhaler 1  . ammonium lactate (AMLACTIN) 12 % cream APPLY TOPICALLY AS NEEDED FOR DRY SKIN. 385 g 0  . blood glucose meter kit and supplies KIT Dispense based on patient and insurance preference. Use before breakfast and at bedtime. ICD10: E11.00 1 each 1  . budesonide-formoterol (SYMBICORT) 160-4.5 MCG/ACT inhaler Inhale 2 puffs into the lungs 2 (two) times daily. 3 Inhaler 3  . dapagliflozin propanediol (FARXIGA) 5 MG TABS tablet Take 5 mg by mouth daily. 30 tablet 11  . fluticasone (FLONASE) 50 MCG/ACT nasal spray Place 2 sprays into both nostrils daily. 16 g 1  . glucose blood test strip Dispense based on patient and insurance preference. Use before breakfast and at bedtime. ICD10: E11.00 100 each 12  . ibuprofen (ADVIL,MOTRIN) 600 MG  tablet TAKE 1 TABLET (600 MG TOTAL) BY MOUTH 2 (TWO) TIMES DAILY AS NEEDED 60 tablet 1  . Lancets MISC Dispense based on patient and insurance preference. Use before breakfast and at bedtime. ICD10: E11.00 100 each 12  . loratadine (CLARITIN) 10 MG tablet TAKE 1 TABLET EVERY DAY 90 tablet 3  . losartan-hydrochlorothiazide (HYZAAR) 100-25 MG tablet TAKE 1 TABLET BY MOUTH EVERY DAY 90 tablet 3  . repaglinide (PRANDIN) 1 MG tablet Take 1 tablet (1 mg total) by mouth 3 (three) times daily before meals. 90 tablet 3  . amoxicillin-clavulanate (AUGMENTIN) 875-125 MG tablet Take 1 tablet by mouth 2 (two) times daily. 20 tablet 0   No current facility-administered medications for this visit.     Allergies: Aspirin; Codeine sulfate; Glimepiride; Metformin; and Propoxyphene n-acetaminophen  Past Medical History:  Diagnosis Date  . Allergic rhinitis   . Diabetes mellitus, type 2 ([place])   . Hyperlipemia   . Hypertension   . Lipoma NEC    anterior upper chest  . Osteoarthritis of knee     Past Surgical History:  Procedure Laterality Date  . ACNE CYST REMOVAL  1970   back  . APPENDECTOMY  1963  . OOPHORECTOMY  1985  . VAGINAL HYSTERECTOMY  1985   complete    Family History  Problem Relation Age of Onset  . Cancer Father        brain  . Diabetes Mother   . Heart attack Mother   . Diabetes Sister   . Diabetes Brother   . Hypertension Sister   . Hypertension Brother   .  Aneurysm Other        niece  . Coronary artery disease Brother     Social History   Tobacco Use  . Smoking status: Never Smoker  . Smokeless tobacco: Never Used  Substance Use Topics  . Alcohol use: No    Alcohol/week: 0.0 standard drinks    Subjective:  Patient presents with concerns for sinus infection; symptoms x 2 weeks; is taking Symbicort- not currently using her Flonase; + right ear pain/ feels full; no chest pain, no shortness of breath, no fever;   Of note, not taking Wilder Glade that was prescribed at last OV  due to cost concerns- only taking Prandin; due for diabetes follow-up next month; does not check her blood sugars regularly;    Objective:  Vitals:   09/30/18 1319  BP: (!) 148/78  Pulse: 77  Temp: 97.8 F (36.6 C)  TempSrc: Oral  SpO2: 98%  Weight: 207 lb 1.9 oz (93.9 kg)  Height: _0  (1.6 m)    General: Well developed, well nourished, in no acute distress  Skin : Warm and dry.  Head: Normocephalic and atraumatic  Eyes: Sclera and conjunctiva clear; pupils round and reactive to light; extraocular movements intact  Ears: External normal; canals clear; tympanic membranes normal  Oropharynx: Pink, supple. No suspicious lesions  Neck: Supple without thyromegaly, adenopathy  Lungs: Respirations unlabored; clear to auscultation bilaterally without wheeze, rales, rhonchi  CVS exam: normal rate and regular rhythm.  Neurologic: Alert and oriented; speech intact; face symmetrical; moves all extremities well; CNII-XII intact without focal deficit   Assessment:  1. Acute sinusitis, recurrence not specified, unspecified location     Plan:  Rx for Augmentin 875 mg bid x 10 days; stressed need to use Flonase; do not feel comfortable prescribing oral steroids due to questions regarding her diabetes.   Return in about 1 month (around 10/31/2018) for with Dr. Alain Marion.  No orders of the defined types were placed in this encounter.   Requested Prescriptions   Signed Prescriptions Disp Refills  . amoxicillin-clavulanate (AUGMENTIN) 875-125 MG tablet 20 tablet 0    Sig: Take 1 tablet by mouth 2 (two) times daily.

## 2018-10-29 ENCOUNTER — Telehealth: Payer: Self-pay | Admitting: *Deleted

## 2018-10-29 NOTE — Telephone Encounter (Signed)
LVM for patient to call back in regards to scheduling AWV with our health coach.  

## 2018-10-31 ENCOUNTER — Telehealth: Payer: Self-pay | Admitting: *Deleted

## 2018-10-31 NOTE — Telephone Encounter (Signed)
LVM for patient to call back in regards to scheduling AWV with our health coach.  

## 2018-11-03 ENCOUNTER — Encounter: Payer: Self-pay | Admitting: Internal Medicine

## 2018-11-03 ENCOUNTER — Ambulatory Visit (INDEPENDENT_AMBULATORY_CARE_PROVIDER_SITE_OTHER): Payer: PPO | Admitting: Internal Medicine

## 2018-11-03 DIAGNOSIS — I10 Essential (primary) hypertension: Secondary | ICD-10-CM

## 2018-11-03 DIAGNOSIS — J4531 Mild persistent asthma with (acute) exacerbation: Secondary | ICD-10-CM | POA: Diagnosis not present

## 2018-11-03 DIAGNOSIS — E1140 Type 2 diabetes mellitus with diabetic neuropathy, unspecified: Secondary | ICD-10-CM | POA: Diagnosis not present

## 2018-11-03 DIAGNOSIS — E785 Hyperlipidemia, unspecified: Secondary | ICD-10-CM

## 2018-11-03 NOTE — Assessment & Plan Note (Signed)
Hyzaar Labs 

## 2018-11-03 NOTE — Assessment & Plan Note (Signed)
Better Using Proair prn

## 2018-11-03 NOTE — Assessment & Plan Note (Signed)
Cardiac Calcium CT scoring test offered 2/20

## 2018-11-03 NOTE — Patient Instructions (Signed)

## 2018-11-03 NOTE — Assessment & Plan Note (Addendum)
Prandin, Farxiga - not taking Risks associated with treatment noncompliance were discussed. Compliance was encouraged. Labs Cardiac Calcium CT scoring test offered 2/20

## 2018-11-03 NOTE — Progress Notes (Signed)
Subjective:  Patient ID: Rachel [name], female    DOB: 1940/10/02  Age: 78 y.o. MRN: [id number]  CC: No chief complaint on file.   HPI DI JASMER presents for sinusitis a month ago and cough - 90% better F/u DM, HTN  Outpatient Medications Prior to Visit  Medication Sig Dispense Refill  . albuterol (PROAIR HFA) 108 (90 Base) MCG/ACT inhaler Inhale 1-2 puffs into the lungs every 6 (six) hours as needed for wheezing or shortness of breath. 1 Inhaler 1  . ammonium lactate (AMLACTIN) 12 % cream APPLY TOPICALLY AS NEEDED FOR DRY SKIN. 385 g 0  . amoxicillin-clavulanate (AUGMENTIN) 875-125 MG tablet Take 1 tablet by mouth 2 (two) times daily. 20 tablet 0  . blood glucose meter kit and supplies KIT Dispense based on patient and insurance preference. Use before breakfast and at bedtime. ICD10: E11.00 1 each 1  . budesonide-formoterol (SYMBICORT) 160-4.5 MCG/ACT inhaler Inhale 2 puffs into the lungs 2 (two) times daily. 3 Inhaler 3  . dapagliflozin propanediol (FARXIGA) 5 MG TABS tablet Take 5 mg by mouth daily. 30 tablet 11  . fluticasone (FLONASE) 50 MCG/ACT nasal spray Place 2 sprays into both nostrils daily. 16 g 1  . glucose blood test strip Dispense based on patient and insurance preference. Use before breakfast and at bedtime. ICD10: E11.00 100 each 12  . ibuprofen (ADVIL,MOTRIN) 600 MG tablet TAKE 1 TABLET (600 MG TOTAL) BY MOUTH 2 (TWO) TIMES DAILY AS NEEDED 60 tablet 1  . Lancets MISC Dispense based on patient and insurance preference. Use before breakfast and at bedtime. ICD10: E11.00 100 each 12  . loratadine (CLARITIN) 10 MG tablet TAKE 1 TABLET EVERY DAY 90 tablet 3  . losartan-hydrochlorothiazide (HYZAAR) 100-25 MG tablet TAKE 1 TABLET BY MOUTH EVERY DAY 90 tablet 3  . repaglinide (PRANDIN) 1 MG tablet Take 1 tablet (1 mg total) by mouth 3 (three) times daily before meals. 90 tablet 3   No facility-administered medications prior to visit.     ROS: Review of  Systems  Objective:  BP 136/64 (BP Location: Left Arm, Patient Position: Sitting, Cuff Size: Large)   Pulse 81   Temp (!) 97.5 F (36.4 C) (Oral)   Ht _0  (1.6 m)   Wt 210 lb (95.3 kg)   SpO2 97%   BMI 37.20 kg/m   BP Readings from Last 3 Encounters:  11/03/18 136/64  09/30/18 (!) 148/78  08/06/18 140/78    Wt Readings from Last 3 Encounters:  11/03/18 210 lb (95.3 kg)  09/30/18 207 lb 1.9 oz (93.9 kg)  08/06/18 202 lb (91.6 kg)    Physical Exam  Lab Results  Component Value Date   WBC 10.5 11/13/2017   HGB 12.6 11/13/2017   HCT 37.7 11/13/2017   PLT 329.0 11/13/2017   GLUCOSE 177 (H) 11/13/2017   CHOL 212 (H) 12/12/2015   TRIG 146.0 12/12/2015   HDL 47.10 12/12/2015   LDLDIRECT 150.2 03/14/2011   LDLCALC 135 (H) 12/12/2015   ALT 16 11/13/2017   AST 17 11/13/2017   NA 135 11/13/2017   K 4.3 11/13/2017   CL 100 11/13/2017   CREATININE 0.79 11/13/2017   BUN 17 11/13/2017   CO2 26 11/13/2017   TSH 2.58 12/12/2015   HGBA1C 9.9 11/13/2017   MICROALBUR 1.8 12/12/2015    Dg Chest 2 View  Result Date: 11/13/2017 CLINICAL DATA:  78 y/o  F; cough, wheeze, and dyspnea for 2 weeks. EXAM: CHEST  2 VIEW  COMPARISON:  07/09/2016 chest radiograph FINDINGS: Borderline cardiomegaly. No consolidation, effusion, or pneumothorax. Mild multilevel discogenic degenerative changes of the thoracic spine. IMPRESSION: Borderline cardiomegaly.  No acute pulmonary process identified. Electronically Signed   By: Kristine Garbe M.D.   On: 11/13/2017 15:45    Assessment & Plan:   There are no diagnoses linked to this encounter.   No orders of the defined types were placed in this encounter.    Follow-up: No follow-ups on file.  Walker Kehr, MD

## 2018-11-20 ENCOUNTER — Other Ambulatory Visit (INDEPENDENT_AMBULATORY_CARE_PROVIDER_SITE_OTHER): Payer: PPO

## 2018-11-20 DIAGNOSIS — J4531 Mild persistent asthma with (acute) exacerbation: Secondary | ICD-10-CM | POA: Diagnosis not present

## 2018-11-20 DIAGNOSIS — E1140 Type 2 diabetes mellitus with diabetic neuropathy, unspecified: Secondary | ICD-10-CM | POA: Diagnosis not present

## 2018-11-20 DIAGNOSIS — E785 Hyperlipidemia, unspecified: Secondary | ICD-10-CM

## 2018-11-20 LAB — URINALYSIS, ROUTINE W REFLEX MICROSCOPIC
Bilirubin Urine: NEGATIVE
Hgb urine dipstick: NEGATIVE
Ketones, ur: NEGATIVE
Nitrite: NEGATIVE
Specific Gravity, Urine: 1.02 (ref 1.000–1.030)
URINE GLUCOSE: NEGATIVE
Urobilinogen, UA: 1 (ref 0.0–1.0)
pH: 7 (ref 5.0–8.0)

## 2018-11-20 LAB — CBC WITH DIFFERENTIAL/PLATELET
BASOS ABS: 0.1 10*3/uL (ref 0.0–0.1)
Basophils Relative: 1.5 % (ref 0.0–3.0)
Eosinophils Absolute: 0.2 10*3/uL (ref 0.0–0.7)
Eosinophils Relative: 1.6 % (ref 0.0–5.0)
HCT: 37.8 % (ref 36.0–46.0)
Hemoglobin: 12.6 g/dL (ref 12.0–15.0)
Lymphocytes Relative: 24.5 % (ref 12.0–46.0)
Lymphs Abs: 2.3 10*3/uL (ref 0.7–4.0)
MCHC: 33.3 g/dL (ref 30.0–36.0)
MCV: 86.4 fl (ref 78.0–100.0)
Monocytes Absolute: 0.4 10*3/uL (ref 0.1–1.0)
Monocytes Relative: 4.4 % (ref 3.0–12.0)
NEUTROS ABS: 6.4 10*3/uL (ref 1.4–7.7)
Neutrophils Relative %: 68 % (ref 43.0–77.0)
Platelets: 335 10*3/uL (ref 150.0–400.0)
RBC: 4.38 Mil/uL (ref 3.87–5.11)
RDW: 14 % (ref 11.5–15.5)
WBC: 9.5 10*3/uL (ref 4.0–10.5)

## 2018-11-20 LAB — LIPID PANEL
Cholesterol: 208 mg/dL — ABNORMAL HIGH (ref 0–200)
HDL: 49.3 mg/dL (ref >39.00)
LDL Cholesterol: 133 mg/dL — ABNORMAL HIGH (ref 0–99)
NonHDL: 159.05
Total CHOL/HDL Ratio: 4
Triglycerides: 131 mg/dL (ref 0.0–149.0)
VLDL: 26.2 mg/dL (ref 0.0–40.0)

## 2018-11-20 LAB — BASIC METABOLIC PANEL
BUN: 16 mg/dL (ref 6–23)
CO2: 29 mEq/L (ref 19–32)
Calcium: 9.5 mg/dL (ref 8.4–10.5)
Chloride: 102 mEq/L (ref 96–112)
Creatinine, Ser: 0.76 mg/dL (ref 0.40–1.20)
GFR: 89.72 mL/min (ref >60.00)
Glucose, Bld: 222 mg/dL — ABNORMAL HIGH (ref 70–99)
POTASSIUM: 4.5 meq/L (ref 3.5–5.1)
SODIUM: 139 meq/L (ref 135–145)

## 2018-11-20 LAB — TSH: TSH: 1.73 u[IU]/mL (ref 0.35–4.50)

## 2018-11-20 LAB — HEMOGLOBIN A1C: Hgb A1c MFr Bld: 10.7 % — ABNORMAL HIGH (ref 4.6–6.5)

## 2018-11-25 ENCOUNTER — Other Ambulatory Visit: Payer: Self-pay

## 2018-11-25 DIAGNOSIS — E1140 Type 2 diabetes mellitus with diabetic neuropathy, unspecified: Secondary | ICD-10-CM

## 2018-11-26 ENCOUNTER — Other Ambulatory Visit: Payer: Self-pay | Admitting: Internal Medicine

## 2018-12-18 ENCOUNTER — Other Ambulatory Visit: Payer: Self-pay | Admitting: Internal Medicine

## 2019-01-20 ENCOUNTER — Telehealth: Payer: Self-pay | Admitting: *Deleted

## 2019-01-20 ENCOUNTER — Telehealth: Payer: Self-pay

## 2019-01-20 MED ORDER — AMMONIUM LACTATE 12 % EX CREA: TOPICAL_CREAM | CUTANEOUS | 11 refills | Status: DC | PRN

## 2019-01-20 NOTE — Telephone Encounter (Signed)
Dr. Prudence Davidson states it would be fine to refill pt's Ammonium Lactate 12% Cream.

## 2019-01-20 NOTE — Telephone Encounter (Signed)
Left message informing pt of Dr. Burnell Blanks orders.

## 2019-03-17 ENCOUNTER — Other Ambulatory Visit: Payer: Self-pay

## 2019-03-17 ENCOUNTER — Encounter: Payer: Self-pay | Admitting: Internal Medicine

## 2019-03-17 ENCOUNTER — Ambulatory Visit (INDEPENDENT_AMBULATORY_CARE_PROVIDER_SITE_OTHER): Payer: PPO | Admitting: Internal Medicine

## 2019-03-17 DIAGNOSIS — B372 Candidiasis of skin and nail: Secondary | ICD-10-CM | POA: Insufficient documentation

## 2019-03-17 MED ORDER — NYSTATIN 100000 UNIT/GM EX POWD: Freq: Four times a day (QID) | CUTANEOUS | 0 refills | Status: AC

## 2019-03-17 MED ORDER — NYSTATIN 100000 UNIT/GM EX CREA: 1.0000 "application " | TOPICAL_CREAM | Freq: Two times a day (BID) | CUTANEOUS | 1 refills | Status: DC

## 2019-03-17 NOTE — Patient Instructions (Addendum)
You have a yeast infection.  Use the powder and or cream for the rash and to help prevent this from recurring.  Keep the area dry.      Skin Yeast Infection  A skin yeast infection is a condition in which there is an overgrowth of yeast (candida) that normally lives on the skin. This condition usually occurs in areas of the skin that are constantly warm and moist, such as the armpits or the groin. What are the causes? This condition is caused by a change in the normal balance of the yeast and bacteria that live on the skin. What increases the risk? You are more likely to develop this condition if you:  Are obese.  Are pregnant.  Take birth control pills.  Have diabetes.  Take antibiotic medicines.  Take steroid medicines.  Are malnourished.  Have a weak body defense system (immune system).  Are 52 years of age or older.  Wear tight clothing. What are the signs or symptoms? The most common symptom of this condition is itchiness in the affected area. Other symptoms include:  Red, swollen area of the skin.  Bumps on the skin. How is this diagnosed?  This condition is diagnosed with a medical history and physical exam.  Your health care provider may check for yeast by taking light scrapings of the skin to be viewed under a microscope. How is this treated? This condition is treated with medicine. Medicines may be prescribed or be available over the counter. The medicines may be:  Taken by mouth (orally).  Applied as a cream or powder to your skin. Follow these instructions at home:   Take or apply over-the-counter and prescription medicines only as told by your health care provider.  Maintain a healthy weight. If you need help losing weight, talk with your health care provider.  Keep your skin clean and dry.  If you have diabetes, keep your blood sugar under control.  Keep all follow-up visits as told by your health care provider. This is important. Contact a  health care provider if:  Your symptoms go away and then return.  Your symptoms do not get better with treatment.  Your symptoms get worse.  Your rash spreads.  You have a fever or chills.  You have new symptoms.  You have new warmth or redness of your skin. Summary  A skin yeast infection is a condition in which there is an overgrowth of yeast (candida) that normally lives on the skin. This condition is caused by a change in the normal balance of the yeast and bacteria that live on the skin.  Take or apply over-the-counter and prescription medicines only as told by your health care provider.  Keep your skin clean and dry.  Contact a health care provider if your symptoms do not get better with treatment. This information is not intended to replace advice given to you by your health care provider. Make sure you discuss any questions you have with your health care provider. Document Released: 05/29/2011 Document Revised: 01/28/2018 Document Reviewed: 01/28/2018 Elsevier Interactive Patient Education  2019 [employer].

## 2019-03-17 NOTE — Assessment & Plan Note (Signed)
Under left panus Nystatin cream and /or powder Use powder for prevention  Advised to call if there is no improvement in her rash

## 2019-03-17 NOTE — Progress Notes (Signed)
Subjective:    Patient ID: Rachel [name], female    DOB: May 10, 1941, 78 y.o.   MRN: [id number]  HPI The patient is here for an acute visit.   Rash on left side of stomach:  She has has a rash under her panus.  It smells, itches and is painful.  It started itching about 4-5 days ago and then started burning and now it painful.    She uses dove soap.  She has been itching the area. She denies any discharge.  She denies fever/chills.  She denies rash elsewhere.  Medications and allergies reviewed with patient and updated if appropriate.  Patient Active Problem List   Diagnosis Date Noted  . Candidiasis of skin 03/17/2019  . 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 Mild cardiomegaly 01/03/2018  . Well adult exam 12/14/2015  . Acute UTI 12/14/2015  . 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 Bacterial pharyngitis 08/25/2014  . Acute bronchitis 07/03/2014  . Oral thrush 07/03/2014  . Onychomycosis 02/18/2013  . URI (upper respiratory infection) 07/11/2012  . Cough 07/11/2012  . Ganglion cyst 06/25/2011  . Foot pain, left 06/25/2011  . Otitis media 06/25/2011  . LIPOMA 11/03/2010  . EUSTACHIAN TUBE DYSFUNCTION, RIGHT 02/18/2010  . Asthma with acute exacerbation 11/02/2009  . ONYCHOMYCOSIS, TOENAILS 05/18/2009  . Dyslipidemia 09/15/2008  . DM2 (diabetes mellitus, type 2) ([place]) 04/19/2007  . Essential hypertension 04/19/2007  . Allergic rhinitis 04/19/2007  . OSTEOARTHRITIS 04/19/2007    Current Outpatient Medications on File Prior to Visit  Medication Sig Dispense Refill  . albuterol (PROAIR HFA) 108 (90 Base) MCG/ACT inhaler Inhale 1-2 puffs into the lungs every 6 (six) hours as needed for wheezing or shortness of breath. 1 Inhaler 1  . ammonium lactate (AMLACTIN) 12 % cream Apply topically as needed for dry skin. 385 g 11  . blood glucose meter kit and supplies KIT Dispense based on patient and insurance preference. Use before breakfast and at bedtime. ICD10: E11.00 1 each 1  .  budesonide-formoterol (SYMBICORT) 160-4.5 MCG/ACT inhaler Inhale 2 puffs into the lungs 2 (two) times daily. 3 Inhaler 3  . dapagliflozin propanediol (FARXIGA) 5 MG TABS tablet Take 5 mg by mouth daily. 30 tablet 11  . fluticasone (FLONASE) 50 MCG/ACT nasal spray Place 2 sprays into both nostrils daily. 16 g 1  . glucose blood test strip Dispense based on patient and insurance preference. Use before breakfast and at bedtime. ICD10: E11.00 100 each 12  . ibuprofen (ADVIL,MOTRIN) 600 MG tablet TAKE 1 TABLET (600 MG TOTAL) BY MOUTH 2 (TWO) TIMES DAILY AS NEEDED 60 tablet 1  . Lancets MISC Dispense based on patient and insurance preference. Use before breakfast and at bedtime. ICD10: E11.00 100 each 12  . loratadine (CLARITIN) 10 MG tablet TAKE 1 TABLET EVERY DAY 90 tablet 3  . losartan-hydrochlorothiazide (HYZAAR) 100-25 MG tablet TAKE 1 TABLET BY MOUTH EVERY DAY 90 tablet 3  . Promethazine-Codeine 6.25-10 MG/5ML SOLN TAKE 5 MLS BY MOUTH EVERY 4 (FOUR) HOURS AS NEEDED. 300 mL 0  . repaglinide (PRANDIN) 1 MG tablet Take 1 tablet (1 mg total) by mouth 3 (three) times daily before meals. 90 tablet 3   No current facility-administered medications on file prior to visit.     Past Medical History:  Diagnosis Date  . Allergic rhinitis   . Diabetes mellitus, type 2 ([place])   . Hyperlipemia   . Hypertension   . Lipoma NEC    anterior upper chest  . Osteoarthritis of  knee     Past Surgical History:  Procedure Laterality Date  . ACNE CYST REMOVAL  1970   back  . APPENDECTOMY  1963  . OOPHORECTOMY  1985  . VAGINAL HYSTERECTOMY  1985   complete    Social History   Socioeconomic History  . Marital status: Married    Spouse name: Not on file  . Number of children: Not on file  . Years of education: Not on file  . Highest education level: Not on file  Occupational History  . Occupation: department mang    Employer: [employer]  Social Needs  . Financial resource strain: Not on file  .  Food insecurity    Worry: Not on file    Inability: Not on file  . Transportation needs    Medical: Not on file    Non-medical: Not on file  Tobacco Use  . Smoking status: Never Smoker  . Smokeless tobacco: Never Used  Substance and Sexual Activity  . Alcohol use: No    Alcohol/week: 0.0 standard drinks  . Drug use: No  . Sexual activity: Never  Lifestyle  . Physical activity    Days per week: Not on file    Minutes per session: Not on file  . Stress: Not on file  Relationships  . Social Herbalist on phone: Not on file    Gets together: Not on file    Attends religious service: Not on file    Active member of club or organization: Not on file    Attends meetings of clubs or organizations: Not on file    Relationship status: Not on file  Other Topics Concern  . Not on file  Social History Narrative   No regular exercise   Widowed    Family History  Problem Relation Age of Onset  . Cancer Father        brain  . Diabetes Mother   . Heart attack Mother   . Diabetes Sister   . Diabetes Brother   . Hypertension Sister   . Hypertension Brother   . Aneurysm Other        niece  . Coronary artery disease Brother     Review of Systems  Constitutional: Negative for chills and fever.  Skin: Positive for rash.       Objective:   Vitals:   03/17/19 1508  BP: (!) 170/84  Pulse: 76  Resp: 16  Temp: 98.1 F (36.7 C)  SpO2: 98%   BP Readings from Last 3 Encounters:  03/17/19 (!) 170/84  11/03/18 136/64  09/30/18 (!) 148/78   Wt Readings from Last 3 Encounters:  03/17/19 207 lb 12 oz (94.2 kg)  11/03/18 210 lb (95.3 kg)  09/30/18 207 lb 1.9 oz (93.9 kg)   Body mass index is 36.8 kg/m.   Physical Exam Constitutional:      Appearance: Normal appearance. She is not toxic-appearing or diaphoretic.  Skin:    General: Skin is warm and dry.     Findings: Rash (under left side of panus - generalized erythema, with areas of skin excoriation, no  blisters or papules) present.  Neurological:     Mental Status: She is alert.            Assessment & Plan:    See Problem List for Assessment and Plan of chronic medical problems.

## 2019-03-18 NOTE — Telephone Encounter (Signed)
Received faxed refill request for ammonium lactate 12% cream. Request was denied, patient was last seen 09/10/2017. Ptient needs office visit.    By Wardell Heath, [place]

## 2019-05-10 ENCOUNTER — Other Ambulatory Visit: Payer: Self-pay | Admitting: Internal Medicine

## 2019-05-10 DIAGNOSIS — I10 Essential (primary) hypertension: Secondary | ICD-10-CM

## 2019-06-22 ENCOUNTER — Other Ambulatory Visit: Payer: Self-pay | Admitting: Family Medicine

## 2019-06-22 DIAGNOSIS — J4531 Mild persistent asthma with (acute) exacerbation: Secondary | ICD-10-CM

## 2019-06-22 NOTE — Telephone Encounter (Signed)
Please advise 

## 2019-09-11 ENCOUNTER — Other Ambulatory Visit: Payer: Self-pay | Admitting: Internal Medicine

## 2019-11-02 ENCOUNTER — Telehealth: Payer: Self-pay | Admitting: Internal Medicine

## 2019-11-02 NOTE — Telephone Encounter (Signed)
    Patient requesting order for new glucose meter   Patient also wants to know if she should get COVID vaccine

## 2019-11-05 ENCOUNTER — Other Ambulatory Visit: Payer: Self-pay

## 2019-11-05 DIAGNOSIS — E11621 Type 2 diabetes mellitus with foot ulcer: Secondary | ICD-10-CM

## 2019-11-05 DIAGNOSIS — L97509 Non-pressure chronic ulcer of other part of unspecified foot with unspecified severity: Secondary | ICD-10-CM

## 2019-11-05 MED ORDER — BLOOD GLUCOSE MONITOR KIT: PACK | 1 refills | Status: DC

## 2019-11-05 NOTE — Telephone Encounter (Signed)
Called and left message for patient today. Also script has been faxed to [facility] on [address] for new meter and supplies.

## 2019-11-22 ENCOUNTER — Ambulatory Visit: Payer: PPO | Attending: Internal Medicine

## 2019-11-22 DIAGNOSIS — Z23 Encounter for immunization: Secondary | ICD-10-CM | POA: Insufficient documentation

## 2019-11-22 NOTE — Progress Notes (Signed)
   Covid-19 Vaccination Clinic  Name:  Rachel [name]    MRN: [id number] DOB: 12/03/1940  11/22/2019  [name] was observed post Covid-19 immunization for 15 minutes without incidence. She was provided with Vaccine Information Sheet and instruction to access the V-Safe system.   [name] was instructed to call 911 with any severe reactions post vaccine: [name] Difficulty breathing  . Swelling of your face and throat  . A fast heartbeat  . A bad rash all over your body  . Dizziness and weakness    Immunizations Administered    Name Date Dose VIS Date Route   Pfizer COVID-19 Vaccine 11/22/2019 11:08 AM 0.3 mL 09/04/2019 Intramuscular   Manufacturer: [place]   Lot: [id number]   [place]: [id number]

## 2019-12-07 ENCOUNTER — Other Ambulatory Visit: Payer: Self-pay | Admitting: Physician Assistant

## 2019-12-07 ENCOUNTER — Other Ambulatory Visit: Payer: Self-pay

## 2019-12-07 ENCOUNTER — Encounter: Payer: Self-pay | Admitting: Emergency Medicine

## 2019-12-07 ENCOUNTER — Ambulatory Visit
Admission: EM | Admit: 2019-12-07 | Discharge: 2019-12-07 | Disposition: A | Discharge status: Home / Self Care | Payer: PPO | Attending: Physician Assistant | Admitting: Physician Assistant

## 2019-12-07 ENCOUNTER — Ambulatory Visit (INDEPENDENT_AMBULATORY_CARE_PROVIDER_SITE_OTHER): Payer: PPO

## 2019-12-07 DIAGNOSIS — I10 Essential (primary) hypertension: Secondary | ICD-10-CM | POA: Diagnosis not present

## 2019-12-07 DIAGNOSIS — U071 COVID-19: Secondary | ICD-10-CM

## 2019-12-07 DIAGNOSIS — E119 Type 2 diabetes mellitus without complications: Secondary | ICD-10-CM

## 2019-12-07 DIAGNOSIS — R05 Cough: Secondary | ICD-10-CM

## 2019-12-07 DIAGNOSIS — R0602 Shortness of breath: Secondary | ICD-10-CM

## 2019-12-07 LAB — POC SARS CORONAVIRUS 2 AG -  ED: SARS Coronavirus 2 Ag: POSITIVE — AB

## 2019-12-07 MED ORDER — BENZONATATE 200 MG PO CAPS: 200.0000 mg | ORAL_CAPSULE | Freq: Three times a day (TID) | ORAL | 0 refills | Status: DC

## 2019-12-07 MED ORDER — SODIUM CHLORIDE 0.9 % IV SOLN
700.0000 mg | Freq: Once | INTRAVENOUS | Status: AC
  Administered 2019-12-08: 700 mg via INTRAVENOUS
  Filled 2019-12-07: qty 20

## 2019-12-07 NOTE — Progress Notes (Signed)
  I connected by phone with Rachel [name] on 12/07/2019 at 1:59 PM to discuss the potential use of an new treatment for mild to moderate COVID-19 viral infection in non-hospitalized patients.  This patient is a 79 y.o. female that meets the FDA criteria for Emergency Use Authorization of bamlanivimab or casirivimab\imdevimab.  Has a (+) direct SARS-CoV-2 viral test result  Has mild or moderate COVID-19   Is ? 77 years of age and weighs ? 40 kg  Is NOT hospitalized due to COVID-19  Is NOT requiring oxygen therapy or requiring an increase in baseline oxygen flow rate due to COVID-19  Is within 10 days of symptom onset  Has at least one of the high risk factor(s) for progression to severe COVID-19 and/or hospitalization as defined in EUA.  Specific high risk criteria : >/= 77 yo   Hx of HTN and DM   I have spoken and communicated the following to the patient or parent/caregiver:  1. FDA has authorized the emergency use of bamlanivimab and casirivimab\imdevimab for the treatment of mild to moderate COVID-19 in adults and pediatric patients with positive results of direct SARS-CoV-2 viral testing who are 7 years of age and older weighing at least 40 kg, and who are at high risk for progressing to severe COVID-19 and/or hospitalization.  2. The significant known and potential risks and benefits of bamlanivimab and casirivimab\imdevimab, and the extent to which such potential risks and benefits are unknown.  3. Information on available alternative treatments and the risks and benefits of those alternatives, including clinical trials.  4. Patients treated with bamlanivimab and casirivimab\imdevimab should continue to self-isolate and use infection control measures (e.g., wear mask, isolate, social distance, avoid sharing personal items, clean and disinfect "high touch" surfaces, and frequent handwashing) according to CDC guidelines.   5. The patient or parent/caregiver has the  option to accept or refuse bamlanivimab or casirivimab\imdevimab .  After reviewing this information with the patient, The patient agreed to proceed with receiving the bamlanimivab infusion and will be provided a copy of the Fact sheet prior to receiving the infusion.Crista Luria Athanasius Kesling 12/07/2019 1:59 PM

## 2019-12-07 NOTE — ED Triage Notes (Signed)
Pt presents to [facility] for assessment of shortness of breath and cough developing over the weekend.  States she used her inhaler last night with some relief, but then it just comes back.  C/o worse SOB laying down.  +1 edema to lower limbs noted in triage.

## 2019-12-07 NOTE — ED Notes (Signed)
Patient able to ambulate independently  

## 2019-12-07 NOTE — ED Provider Notes (Signed)
[facility]    CSN: [id number] Arrival date & time: 12/07/19  1044      History   Chief Complaint Chief Complaint  Patient presents with  . Shortness of Breath    HPI Rachel [name] is a 79 y.o. female.   79 year old female comes in for 2-3 day history of URI symptoms. Has had cough, shortness of breath. Describes shortness of breath as "needing to breath out of the mouth even if nose is not stopped up". Also endorses exertional dyspnea. Denies orthopnea, but having trouble laying flat due to cough. Denies chest pain, palpitations. Denies weakness, dizziness, syncope. Denies fever, chills, body aches. Denies abdominal pain, nausea, vomiting, diarrhea. Denies loss of taste/smell. First COVID vaccine 2 weeks ago. Positive COVID contact this weekend. Albuterol with some relief of symptoms. Never smoker.   History of non insulin dependant DM, last a1c 7-8. However, CBG fluctuating in the 200s these past few days.      Past Medical History:  Diagnosis Date  . Allergic rhinitis   . Diabetes mellitus, type 2 ([place])   . Hyperlipemia   . Hypertension   . Lipoma NEC    anterior upper chest  . Osteoarthritis of knee     Patient Active Problem List   Diagnosis Date Noted  . Candidiasis of skin 03/17/2019  . 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 Mild cardiomegaly 01/03/2018  . Well adult exam 12/14/2015  . Acute UTI 12/14/2015  . 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 Bacterial pharyngitis 08/25/2014  . Acute bronchitis 07/03/2014  . Oral thrush 07/03/2014  . Onychomycosis 02/18/2013  . URI (upper respiratory infection) 07/11/2012  . Cough 07/11/2012  . Ganglion cyst 06/25/2011  . Foot pain, left 06/25/2011  . Otitis media 06/25/2011  . LIPOMA 11/03/2010  . EUSTACHIAN TUBE DYSFUNCTION, RIGHT 02/18/2010  . Asthma with acute exacerbation 11/02/2009  . ONYCHOMYCOSIS, TOENAILS 05/18/2009  . Dyslipidemia 09/15/2008  . DM2 (diabetes mellitus, type 2) ([place]) 04/19/2007   . Essential hypertension 04/19/2007  . Allergic rhinitis 04/19/2007  . OSTEOARTHRITIS 04/19/2007    Past Surgical History:  Procedure Laterality Date  . ACNE CYST REMOVAL  1970   back  . APPENDECTOMY  1963  . OOPHORECTOMY  1985  . VAGINAL HYSTERECTOMY  1985   complete    OB History   No obstetric history on file.      Home Medications    Prior to Admission medications   Medication Sig Start Date End Date Taking? Authorizing Provider  albuterol (VENTOLIN HFA) 108 (90 Base) MCG/ACT inhaler INHALE 1-2 PUFFS INTO THE LUNGS EVERY 6 (SIX) HOURS AS NEEDED FOR WHEEZING OR SHORTNESS OF BREATH. 06/23/19   Plotnikov, Evie Lacks, MD  ammonium lactate (AMLACTIN) 12 % cream Apply topically as needed for dry skin. 01/20/19   Gardiner Barefoot, DPM  benzonatate (TESSALON) 200 MG capsule Take 1 capsule (200 mg total) by mouth every 8 (eight) hours. 12/07/19   Tasia Catchings, Dianna Deshler V, PA-C  blood glucose meter kit and supplies KIT Dispense based on patient and insurance preference. Use before breakfast and at bedtime. ICD10: E11.00 11/05/19   Plotnikov, Evie Lacks, MD  budesonide-formoterol (SYMBICORT) 160-4.5 MCG/ACT inhaler Inhale 2 puffs into the lungs 2 (two) times daily. 08/06/18 08/06/19  Plotnikov, Evie Lacks, MD  dapagliflozin propanediol (FARXIGA) 5 MG TABS tablet Take 5 mg by mouth daily. 08/06/18   Plotnikov, Evie Lacks, MD  fluticasone (FLONASE) 50 MCG/ACT nasal spray Place 2 sprays into both nostrils daily. 06/03/18  Marrian Salvage, FNP  glucose blood test strip Dispense based on patient and insurance preference. Use before breakfast and at bedtime. ICD10: E11.00 02/21/17   Plotnikov, Evie Lacks, MD  ibuprofen (ADVIL,MOTRIN) 600 MG tablet TAKE 1 TABLET (600 MG TOTAL) BY MOUTH 2 (TWO) TIMES DAILY AS NEEDED 12/18/18   Plotnikov, Evie Lacks, MD  Lancets MISC Dispense based on patient and insurance preference. Use before breakfast and at bedtime. ICD10: E11.00 02/21/17   Plotnikov, Evie Lacks, MD  loratadine  (CLARITIN) 10 MG tablet Take 1 tablet (10 mg total) by mouth daily. Annual appt due in Feb must see provider for future refills 09/11/19   Plotnikov, Evie Lacks, MD  losartan-hydrochlorothiazide (HYZAAR) 100-25 MG tablet TAKE 1 TABLET BY MOUTH EVERY DAY 05/10/19   Plotnikov, Evie Lacks, MD  nystatin (MYCOSTATIN/NYSTOP) powder Apply topically 4 (four) times daily. 03/17/19   Binnie Rail, MD  nystatin cream (MYCOSTATIN) Apply 1 application topically 2 (two) times daily. 03/17/19   Burns, Claudina Lick, MD  Promethazine-Codeine 6.25-10 MG/5ML SOLN TAKE 5 MLS BY MOUTH EVERY 4 (FOUR) HOURS AS NEEDED. 11/27/18   Plotnikov, Evie Lacks, MD  repaglinide (PRANDIN) 1 MG tablet Take 1 tablet (1 mg total) by mouth 3 (three) times daily before meals. 11/13/17   Marrian Salvage, FNP    Family History Family History  Problem Relation Age of Onset  . Cancer Father        brain  . Diabetes Mother   . Heart attack Mother   . Diabetes Sister   . Diabetes Brother   . Hypertension Sister   . Hypertension Brother   . Aneurysm Other        niece  . Coronary artery disease Brother     Social History Social History   Tobacco Use  . Smoking status: Never Smoker  . Smokeless tobacco: Never Used  Substance Use Topics  . Alcohol use: No    Alcohol/week: 0.0 standard drinks  . Drug use: No     Allergies   Aspirin, Codeine sulfate, Glimepiride, Metformin, and Propoxyphene n-acetaminophen   Review of Systems Review of Systems  Reason unable to perform ROS: See HPI as above.     Physical Exam Triage Vital Signs ED Triage Vitals [12/07/19 1059]  Enc Vitals Group     BP (!) 163/90     Pulse Rate 77     Resp 18     Temp (!) 97.5 F (36.4 C)     Temp Source Temporal     SpO2 95 %     Weight      Height      Head Circumference      Peak Flow      Pain Score 0     Pain Loc      Pain Edu?      Excl. in [place]?    No data found.  Updated Vital Signs BP (!) 163/90 (BP Location: Left Arm)   Pulse  77   Temp (!) 97.5 F (36.4 C) (Temporal)   Resp 18   SpO2 95%   Physical Exam Constitutional:      General: She is not in acute distress.    Appearance: Normal appearance. She is not ill-appearing, toxic-appearing or diaphoretic.  HENT:     Head: Normocephalic and atraumatic.     Mouth/Throat:     Mouth: Mucous membranes are moist.     Pharynx: Oropharynx is clear. Uvula midline.  Cardiovascular:  Rate and Rhythm: Normal rate and regular rhythm.     Heart sounds: Normal heart sounds. No murmur. No friction rub. No gallop.   Pulmonary:     Effort: Pulmonary effort is normal. No accessory muscle usage, prolonged expiration, respiratory distress or retractions.     Comments: Lungs clear to auscultation without adventitious lung sounds. Musculoskeletal:     Cervical back: Normal range of motion and neck supple.     Comments: Minimal pitting edema to the foot/ankles bilaterally. No pain. No erythema, warmth  Skin:    General: Skin is warm and dry.  Neurological:     General: No focal deficit present.     Mental Status: She is alert and oriented to person, place, and time.      UC Treatments / Results  Labs (all labs ordered are listed, but only abnormal results are displayed) Labs Reviewed  POC SARS CORONAVIRUS 2 AG -  ED - Abnormal; Notable for the following components:      Result Value   SARS Coronavirus 2 Ag Positive (*)    All other components within normal limits    EKG   Radiology DG Chest 2 View  Result Date: 12/07/2019 CLINICAL DATA:  Cough and shortness of breath EXAM: CHEST - 2 VIEW COMPARISON:  11/13/2017 FINDINGS: Normal heart size and mediastinal contours. There is no edema, consolidation, effusion, or pneumothorax. Thoracic spondylosis IMPRESSION: No evidence of acute disease. Electronically Signed   By: Monte Fantasia M.D.   On: 12/07/2019 11:35    Procedures Procedures (including critical care time)  Medications Ordered in UC Medications - No  data to display  Initial Impression / Assessment and Plan / UC Course  I have reviewed the triage vital signs and the nursing notes.  Pertinent labs & imaging results that were available during my care of the patient were reviewed by me and considered in my medical decision making (see chart for details).    79 year old female with history of HTN, non-insulin-dependent DM comes in for 2 to 3-day history of cough and shortness of breath.  Denies fever, chest pain, orthopnea.  Denies weakness, dizziness, syncope.  Positive Covid contact 2 to 3 days ago.  First dose of Covid vaccine 2 weeks ago.  Slightly hypertensive at 163/90.  Patient afebrile without tachycardia, tachypnea, hypoxia.  She is speaking in full sentences without difficulty.  Lungs are clear to auscultation bilaterally without adventitious lung sounds.  Given age with shortness of breath, will obtain rapid Covid and chest x-ray.  Chest x-ray negative for active cardiopulmonary disease.  Rapid Covid positive.  Symptomatic treatment discussed.  Push fluids.  Return precautions given.  Given age and comorbidities, left message for infusion clinic for referral.  Patient expresses understanding and agrees to plan.  Final Clinical Impressions(s) / UC Diagnoses   Final diagnoses:  [id number]    ED Prescriptions    Medication Sig Dispense Auth. Provider   benzonatate (TESSALON) 200 MG capsule Take 1 capsule (200 mg total) by mouth every 8 (eight) hours. 21 capsule Ok Edwards, PA-C     PDMP not reviewed this encounter.   Ok Edwards, PA-C 12/07/19 1158

## 2019-12-07 NOTE — Discharge Instructions (Signed)
Rapid COVID positive. Please quarantine at this time. You can discontinue quarantine after 10 days of symptom onset AND 24 hours of no fever with improvement of symptoms. You can take tessalon for cough. Continue albuterol for cough and shortness of breath. If develop worsening shortness of breath, chest pain, trouble breathing, weakness, dizziness, go to the emergency department for further evaluation needed.

## 2019-12-08 ENCOUNTER — Ambulatory Visit (HOSPITAL_COMMUNITY)
Admission: RE | Admit: 2019-12-08 | Discharge: 2019-12-08 | Disposition: A | Discharge status: Home / Self Care | Payer: Medicare Other | Source: Ambulatory Visit | Attending: Pulmonary Disease | Admitting: Pulmonary Disease

## 2019-12-08 DIAGNOSIS — Z23 Encounter for immunization: Secondary | ICD-10-CM | POA: Diagnosis not present

## 2019-12-08 DIAGNOSIS — U071 COVID-19: Secondary | ICD-10-CM | POA: Diagnosis present

## 2019-12-08 MED ORDER — ALBUTEROL SULFATE HFA 108 (90 BASE) MCG/ACT IN AERS: 2.0000 | INHALATION_SPRAY | Freq: Once | RESPIRATORY_TRACT | Status: DC | PRN

## 2019-12-08 MED ORDER — SODIUM CHLORIDE 0.9 % IV SOLN
INTRAVENOUS | Status: DC | PRN
  Administered 2019-12-08: 250 mL via INTRAVENOUS

## 2019-12-08 MED ORDER — FAMOTIDINE IN NACL 20-0.9 MG/50ML-% IV SOLN: 20.0000 mg | Freq: Once | INTRAVENOUS | Status: DC | PRN

## 2019-12-08 MED ORDER — METHYLPREDNISOLONE SODIUM SUCC 125 MG IJ SOLR: 125.0000 mg | Freq: Once | INTRAMUSCULAR | Status: DC | PRN

## 2019-12-08 MED ORDER — EPINEPHRINE 0.3 MG/0.3ML IJ SOAJ: 0.3000 mg | Freq: Once | INTRAMUSCULAR | Status: DC | PRN

## 2019-12-08 MED ORDER — DIPHENHYDRAMINE HCL 50 MG/ML IJ SOLN: 50.0000 mg | Freq: Once | INTRAMUSCULAR | Status: DC | PRN

## 2019-12-08 NOTE — Progress Notes (Signed)
  Diagnosis: COVID-19  Physician: Dr. Joya Gaskins  Procedure: Covid Infusion Clinic Med: bamlanivimab infusion - Provided patient with bamlanimivab fact sheet for patients, parents and caregivers prior to infusion.  Complications: No immediate complications noted.  Discharge: Discharged home   Janine Ores 12/08/2019

## 2019-12-08 NOTE — Discharge Instructions (Signed)

## 2019-12-23 ENCOUNTER — Ambulatory Visit: Payer: PPO

## 2019-12-31 ENCOUNTER — Telehealth: Payer: Self-pay | Admitting: Internal Medicine

## 2019-12-31 NOTE — Telephone Encounter (Signed)
Please advise 

## 2019-12-31 NOTE — Telephone Encounter (Signed)
New message:  Pt is calling and states she had covid and she has quarantined for 3 weeks but states she is needing a note for work so she won't have to return. 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 She states she did have one vaccination shot and right after that she found out she was positive with the virus. Please advise.

## 2020-01-02 ENCOUNTER — Other Ambulatory Visit: Payer: Self-pay | Admitting: Internal Medicine

## 2020-01-03 NOTE — Telephone Encounter (Signed)
Okay note for work. She can get the second shot 2 weeks after she recovered from COVID-19. Thanks

## 2020-01-05 ENCOUNTER — Other Ambulatory Visit: Payer: Self-pay | Admitting: Internal Medicine

## 2020-01-07 NOTE — Telephone Encounter (Signed)
LM notifying pt letter is up front to be picked up

## 2020-01-12 ENCOUNTER — Other Ambulatory Visit (INDEPENDENT_AMBULATORY_CARE_PROVIDER_SITE_OTHER): Payer: PPO

## 2020-01-12 ENCOUNTER — Other Ambulatory Visit: Payer: Self-pay

## 2020-01-12 DIAGNOSIS — E1140 Type 2 diabetes mellitus with diabetic neuropathy, unspecified: Secondary | ICD-10-CM

## 2020-01-12 DIAGNOSIS — Z0000 Encounter for general adult medical examination without abnormal findings: Secondary | ICD-10-CM | POA: Diagnosis not present

## 2020-01-12 LAB — BASIC METABOLIC PANEL
BUN: 23 mg/dL (ref 6–23)
CO2: 27 mEq/L (ref 19–32)
Calcium: 9.7 mg/dL (ref 8.4–10.5)
Chloride: 98 mEq/L (ref 96–112)
Creatinine, Ser: 0.98 mg/dL (ref 0.40–1.20)
GFR: 66.7 mL/min (ref >60.00)
Glucose, Bld: 395 mg/dL — ABNORMAL HIGH (ref 70–99)
Potassium: 4.6 mEq/L (ref 3.5–5.1)
Sodium: 133 mEq/L — ABNORMAL LOW (ref 135–145)

## 2020-01-12 LAB — URINALYSIS
Bilirubin Urine: NEGATIVE
Hgb urine dipstick: NEGATIVE
Ketones, ur: NEGATIVE
Leukocytes,Ua: NEGATIVE
Nitrite: NEGATIVE
Specific Gravity, Urine: 1.02 (ref 1.000–1.030)
Total Protein, Urine: NEGATIVE
Urine Glucose: >=1000 — AB
Urobilinogen, UA: 0.2 (ref 0.0–1.0)
pH: 6 (ref 5.0–8.0)

## 2020-01-12 LAB — CBC WITH DIFFERENTIAL/PLATELET
Basophils Absolute: 0.1 10*3/uL (ref 0.0–0.1)
Basophils Relative: 1.4 % (ref 0.0–3.0)
Eosinophils Absolute: 0.1 10*3/uL (ref 0.0–0.7)
Eosinophils Relative: 1.1 % (ref 0.0–5.0)
HCT: 38.5 % (ref 36.0–46.0)
Hemoglobin: 12.8 g/dL (ref 12.0–15.0)
Lymphocytes Relative: 35.7 % (ref 12.0–46.0)
Lymphs Abs: 2.8 10*3/uL (ref 0.7–4.0)
MCHC: 33.2 g/dL (ref 30.0–36.0)
MCV: 87.5 fl (ref 78.0–100.0)
Monocytes Absolute: 0.4 10*3/uL (ref 0.1–1.0)
Monocytes Relative: 4.9 % (ref 3.0–12.0)
Neutro Abs: 4.5 10*3/uL (ref 1.4–7.7)
Neutrophils Relative %: 56.9 % (ref 43.0–77.0)
Platelets: 336 10*3/uL (ref 150.0–400.0)
RBC: 4.4 Mil/uL (ref 3.87–5.11)
RDW: 14.2 % (ref 11.5–15.5)
WBC: 7.9 10*3/uL (ref 4.0–10.5)

## 2020-01-12 LAB — HEPATIC FUNCTION PANEL
ALT: 12 U/L (ref 0–35)
AST: 13 U/L (ref 0–37)
Albumin: 4.1 g/dL (ref 3.5–5.2)
Alkaline Phosphatase: 80 U/L (ref 39–117)
Bilirubin, Direct: 0.2 mg/dL (ref 0.0–0.3)
Total Bilirubin: 0.7 mg/dL (ref 0.2–1.2)
Total Protein: 8.1 g/dL (ref 6.0–8.3)

## 2020-01-12 LAB — TSH: TSH: 1.84 u[IU]/mL (ref 0.35–4.50)

## 2020-01-12 LAB — LIPID PANEL
Cholesterol: 222 mg/dL — ABNORMAL HIGH (ref 0–200)
HDL: 44.8 mg/dL (ref >39.00)
NonHDL: 177.13
Total CHOL/HDL Ratio: 5
Triglycerides: 226 mg/dL — ABNORMAL HIGH (ref 0.0–149.0)
VLDL: 45.2 mg/dL — ABNORMAL HIGH (ref 0.0–40.0)

## 2020-01-12 LAB — LDL CHOLESTEROL, DIRECT: Direct LDL: 139 mg/dL

## 2020-01-12 LAB — HEMOGLOBIN A1C: Hgb A1c MFr Bld: 13 % — ABNORMAL HIGH (ref 4.6–6.5)

## 2020-01-19 ENCOUNTER — Other Ambulatory Visit: Payer: Self-pay | Admitting: Internal Medicine

## 2020-01-21 ENCOUNTER — Encounter: Payer: Self-pay | Admitting: Internal Medicine

## 2020-01-21 ENCOUNTER — Other Ambulatory Visit: Payer: Self-pay

## 2020-01-21 ENCOUNTER — Ambulatory Visit (INDEPENDENT_AMBULATORY_CARE_PROVIDER_SITE_OTHER): Payer: PPO | Admitting: Internal Medicine

## 2020-01-21 DIAGNOSIS — I10 Essential (primary) hypertension: Secondary | ICD-10-CM | POA: Diagnosis not present

## 2020-01-21 DIAGNOSIS — E785 Hyperlipidemia, unspecified: Secondary | ICD-10-CM

## 2020-01-21 DIAGNOSIS — E1140 Type 2 diabetes mellitus with diabetic neuropathy, unspecified: Secondary | ICD-10-CM | POA: Diagnosis not present

## 2020-01-21 DIAGNOSIS — J302 Other seasonal allergic rhinitis: Secondary | ICD-10-CM | POA: Diagnosis not present

## 2020-01-21 MED ORDER — REPAGLINIDE 1 MG PO TABS: 1.0000 mg | ORAL_TABLET | Freq: Three times a day (TID) | ORAL | 3 refills | Status: DC

## 2020-01-21 MED ORDER — LORATADINE 10 MG PO TABS: 10.0000 mg | ORAL_TABLET | Freq: Every day | ORAL | 3 refills | Status: DC

## 2020-01-21 NOTE — Patient Instructions (Signed)

## 2020-01-21 NOTE — Assessment & Plan Note (Signed)
Hyzaar 

## 2020-01-21 NOTE — Assessment & Plan Note (Signed)
Loratidine

## 2020-01-21 NOTE — Assessment & Plan Note (Addendum)
CBGs >300. Not taking any Rx for a long time by choice.[name]"I don't want to be a diabetic" Lab Results  Component Value Date   HGBA1C 13.0 (H) 01/12/2020   Risks associated with treatment noncompliance were discussed. Compliance was encouraged.  Re-start Prandin, hope to re-start [place] later. Endo ref

## 2020-01-21 NOTE — Progress Notes (Signed)
Subjective:  Patient ID: Rachel [name], female    DOB: 06/01/1941  Age: 79 y.o. MRN: [id number]  CC: No chief complaint on file.   HPI Rachel [name] presents for a well exam/MC well F/u DM, HTN. CBGs >300. Not taking any Rx for a long time by choice.[name]".I don't want to be a diabetic" Pt was sick w/COVID19 in March 2021   Outpatient Medications Prior to Visit  Medication Sig Dispense Refill  . albuterol (VENTOLIN HFA) 108 (90 Base) MCG/ACT inhaler INHALE 1-2 PUFFS INTO THE LUNGS EVERY 6 (SIX) HOURS AS NEEDED FOR WHEEZING OR SHORTNESS OF BREATH. 18 g 1  . ammonium lactate (AMLACTIN) 12 % cream Apply topically as needed for dry skin. 385 g 11  . benzonatate (TESSALON) 200 MG capsule Take 1 capsule (200 mg total) by mouth every 8 (eight) hours. 21 capsule 0  . blood glucose meter kit and supplies KIT Dispense based on patient and insurance preference. Use before breakfast and at bedtime. ICD10: E11.00 1 each 1  . dapagliflozin propanediol (FARXIGA) 5 MG TABS tablet Take 5 mg by mouth daily. 30 tablet 11  . fluticasone (FLONASE) 50 MCG/ACT nasal spray Place 2 sprays into both nostrils daily. 16 g 1  . glucose blood test strip Dispense based on patient and insurance preference. Use before breakfast and at bedtime. ICD10: E11.00 100 each 12  . ibuprofen (ADVIL,MOTRIN) 600 MG tablet TAKE 1 TABLET (600 MG TOTAL) BY MOUTH 2 (TWO) TIMES DAILY AS NEEDED 60 tablet 1  . Lancets MISC Dispense based on patient and insurance preference. Use before breakfast and at bedtime. ICD10: E11.00 100 each 12  . loratadine (CLARITIN) 10 MG tablet Take 1 tablet (10 mg total) by mouth daily. Office visit needed before refills will be given 30 tablet 0  . losartan-hydrochlorothiazide (HYZAAR) 100-25 MG tablet TAKE 1 TABLET BY MOUTH EVERY DAY 90 tablet 3  . nystatin (MYCOSTATIN/NYSTOP) powder Apply topically 4 (four) times daily. 60 g 0  . nystatin cream (MYCOSTATIN) Apply 1 application  topically 2 (two) times daily. 30 g 1  . Promethazine-Codeine 6.25-10 MG/5ML SOLN TAKE 5 MLS BY MOUTH EVERY 4 (FOUR) HOURS AS NEEDED. 300 mL 0  . repaglinide (PRANDIN) 1 MG tablet Take 1 tablet (1 mg total) by mouth 3 (three) times daily before meals. 90 tablet 3  . budesonide-formoterol (SYMBICORT) 160-4.5 MCG/ACT inhaler Inhale 2 puffs into the lungs 2 (two) times daily. 3 Inhaler 3   No facility-administered medications prior to visit.    ROS: Review of Systems  Constitutional: Negative for activity change, appetite change, chills, fatigue and unexpected weight change.  HENT: Negative for congestion, mouth sores and sinus pressure.   Eyes: Negative for visual disturbance.  Respiratory: Negative for cough and chest tightness.   Gastrointestinal: Negative for abdominal pain and nausea.  Genitourinary: Negative for difficulty urinating, frequency and vaginal pain.  Musculoskeletal: Negative for back pain and gait problem.  Skin: Negative for pallor and rash.  Neurological: Negative for dizziness, tremors, weakness, numbness and headaches.  Psychiatric/Behavioral: Negative for confusion and sleep disturbance.    Objective:  BP (!) 154/78 (BP Location: Left Arm, Patient Position: Sitting, Cuff Size: Large)   Pulse 77   Temp 98 F (36.7 C) (Oral)   Ht '5\' 3"'$  (1.6 m)   Wt 197 lb (89.4 kg)   SpO2 97%   BMI 34.90 kg/m   BP Readings from Last 3 Encounters:  01/21/20 (!) 154/78  12/08/19 (!) 169/70  12/07/19 [name])  163/90    Wt Readings from Last 3 Encounters:  01/21/20 197 lb (89.4 kg)  03/17/19 207 lb 12 oz (94.2 kg)  11/03/18 210 lb (95.3 kg)    Physical Exam Constitutional:      General: She is not in acute distress.    Appearance: She is well-developed. She is obese.  HENT:     Head: Normocephalic.     Right Ear: External ear normal.     Left Ear: External ear normal.     Nose: Nose normal.  Eyes:     General:        Right eye: No discharge.        Left eye: No  discharge.     Conjunctiva/sclera: Conjunctivae normal.     Pupils: Pupils are equal, round, and reactive to light.  Neck:     Thyroid: No thyromegaly.     Vascular: No JVD.     Trachea: No tracheal deviation.  Cardiovascular:     Rate and Rhythm: Normal rate and regular rhythm.     Heart sounds: Normal heart sounds.  Pulmonary:     Effort: No respiratory distress.     Breath sounds: No stridor. No wheezing.  Abdominal:     General: Bowel sounds are normal. There is no distension.     Palpations: Abdomen is soft. There is no mass.     Tenderness: There is no abdominal tenderness. There is no guarding or rebound.  Musculoskeletal:        General: No tenderness.     Cervical back: Normal range of motion and neck supple.  Lymphadenopathy:     Cervical: No cervical adenopathy.  Skin:    Findings: No erythema or rash.  Neurological:     Cranial Nerves: No cranial nerve deficit.     Motor: No abnormal muscle tone.     Coordination: Coordination normal.     Deep Tendon Reflexes: Reflexes normal.  Psychiatric:        Behavior: Behavior normal.        Thought Content: Thought content normal.        Judgment: Judgment normal.     Lab Results  Component Value Date   WBC 7.9 01/12/2020   HGB 12.8 01/12/2020   HCT 38.5 01/12/2020   PLT 336.0 01/12/2020   GLUCOSE 395 (H) 01/12/2020   CHOL 222 (H) 01/12/2020   TRIG 226.0 (H) 01/12/2020   HDL 44.80 01/12/2020   LDLDIRECT 139.0 01/12/2020   LDLCALC 133 (H) 11/20/2018   ALT 12 01/12/2020   AST 13 01/12/2020   NA 133 (L) 01/12/2020   K 4.6 01/12/2020   CL 98 01/12/2020   CREATININE 0.98 01/12/2020   BUN 23 01/12/2020   CO2 27 01/12/2020   TSH 1.84 01/12/2020   HGBA1C 13.0 (H) 01/12/2020   MICROALBUR 1.8 12/12/2015    No results found.  Assessment & Plan:    Walker Kehr, MD

## 2020-01-21 NOTE — Assessment & Plan Note (Signed)
Cardiac Calcium CT scoring test offered

## 2020-02-18 ENCOUNTER — Other Ambulatory Visit: Payer: Self-pay

## 2020-02-19 ENCOUNTER — Ambulatory Visit: Payer: PPO | Admitting: Endocrinology

## 2020-02-23 ENCOUNTER — Ambulatory Visit: Payer: PPO | Admitting: Endocrinology

## 2020-03-14 ENCOUNTER — Ambulatory Visit: Payer: PPO | Attending: Internal Medicine

## 2020-03-14 DIAGNOSIS — Z23 Encounter for immunization: Secondary | ICD-10-CM

## 2020-03-14 NOTE — Progress Notes (Signed)
   Covid-19 Vaccination Clinic  Name:  Rachel [name]    MRN: [id number] DOB: [birth year]  03/14/2020  Rachel [name] was observed post Covid-19 immunization for 15 minutes without incident. She was provided with Vaccine Information Sheet and instruction to access the V-Safe system.   Rachel [name] was instructed to call 911 with any severe reactions post vaccine: [name] Difficulty breathing  . Swelling of face and throat  . A fast heartbeat  . A bad rash all over body  . Dizziness and weakness   Immunizations Administered    Name Date Dose VIS Date Route   Pfizer COVID-19 Vaccine 03/14/2020  3:45 PM 0.3 mL 11/18/2018 Intramuscular   Manufacturer: [employer], [employer]   Lot: [id number]   [place]: [id number]

## 2020-03-31 ENCOUNTER — Ambulatory Visit (INDEPENDENT_AMBULATORY_CARE_PROVIDER_SITE_OTHER): Payer: PPO | Admitting: Endocrinology

## 2020-03-31 ENCOUNTER — Encounter: Payer: Self-pay | Admitting: Endocrinology

## 2020-03-31 ENCOUNTER — Other Ambulatory Visit: Payer: Self-pay

## 2020-03-31 VITALS — BP 124/70 | HR 76 | Ht 63.0 in | Wt 203.8 lb

## 2020-03-31 DIAGNOSIS — E1140 Type 2 diabetes mellitus with diabetic neuropathy, unspecified: Secondary | ICD-10-CM | POA: Diagnosis not present

## 2020-03-31 LAB — POCT GLYCOSYLATED HEMOGLOBIN (HGB A1C): Hemoglobin A1C: 10.6 % — AB (ref 4.0–5.6)

## 2020-03-31 NOTE — Progress Notes (Signed)
Subjective:    Patient ID: Rachel [name], female    DOB: 1941/03/18, 79 y.o.   MRN: [id number]  HPI pt is referred by Dr Alain Marion, for diabetes.  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  She takes repaglinide.  She says cbg's are approx 200.  She did not tolerate metformin (diarrhea).  She declined Jardiance.   Past Medical History:  Diagnosis Date  . Allergic rhinitis   . Diabetes mellitus, type 2 ([place])   . Hyperlipemia   . Hypertension   . Lipoma NEC    anterior upper chest  . Osteoarthritis of knee     Past Surgical History:  Procedure Laterality Date  . ACNE CYST REMOVAL  1970   back  . APPENDECTOMY  1963  . OOPHORECTOMY  1985  . VAGINAL HYSTERECTOMY  1985   complete    Social History   Socioeconomic History  . Marital status: Widowed    Spouse name: Not on file  . Number of children: Not on file  . Years of education: Not on file  . Highest education level: Not on file  Occupational History  . Occupation: department mang    Employer: [employer]  Tobacco Use  . Smoking status: Never Smoker  . Smokeless tobacco: Never Used  Substance and Sexual Activity  . Alcohol use: No    Alcohol/week: 0.0 standard drinks  . Drug use: No  . Sexual activity: Never  Other Topics Concern  . Not on file  Social History Narrative   No regular exercise   Widowed   Social Determinants of Health   Financial Resource Strain:   . Difficulty of Paying Living Expenses:   Food Insecurity:   . Worried About Charity fundraiser in the Last Year:   . Arboriculturist in the Last Year:   Transportation Needs:   . Film/video editor (Medical):   [name] Lack of Transportation (Non-Medical):   Physical Activity:   . Days of Exercise per Week:   . Minutes of Exercise per Session:     Stress:   . Feeling of Stress :   Social Connections:   . Frequency of Communication with Friends and Family:   . Frequency of Social Gatherings with Friends and Family:   . Attends Religious Services:   . Active Member of Clubs or Organizations:   . Attends Archivist Meetings:   [name] Marital Status:   Intimate Partner Violence:   . Fear of Current or Ex-Partner:   . Emotionally Abused:   [name] Physically Abused:   . Sexually Abused:     Current Outpatient Medications on File Prior to Visit  Medication Sig Dispense Refill  . albuterol (VENTOLIN HFA) 108 (90 Base) MCG/ACT inhaler INHALE 1-2 PUFFS INTO THE LUNGS EVERY 6 (SIX) HOURS AS NEEDED FOR WHEEZING OR SHORTNESS OF BREATH. 18 g 1  . ammonium lactate (AMLACTIN) 12 % cream Apply topically as needed for dry skin. 385 g 11  . benzonatate (TESSALON) 200 MG capsule Take 1 capsule (200 mg total) by mouth every 8 (eight) hours. 21 capsule 0  . Blood Glucose Monitoring Suppl (ONETOUCH VERIO REFLECT) w/Device KIT 1 each by Does not apply route daily. E11.9    . fluticasone (FLONASE) 50 MCG/ACT nasal spray  Place 2 sprays into both nostrils daily. 16 g 1  . Glucose Blood (ONETOUCH VERIO VI) 1 each by Other route daily. E11.9    . ibuprofen (ADVIL,MOTRIN) 600 MG tablet TAKE 1 TABLET (600 MG TOTAL) BY MOUTH 2 (TWO) TIMES DAILY AS NEEDED 60 tablet 1  . loratadine (CLARITIN) 10 MG tablet Take 1 tablet (10 mg total) by mouth daily. 100 tablet 3  . losartan-hydrochlorothiazide (HYZAAR) 100-25 MG tablet TAKE 1 TABLET BY MOUTH EVERY DAY 90 tablet 3  . nystatin (MYCOSTATIN/NYSTOP) powder Apply topically 4 (four) times daily. 60 g 0  . nystatin cream (MYCOSTATIN) Apply 1 application topically 2 (two) times daily. 30 g 1  . OneTouch Delica Lancets 56E MISC 1 each by Does not apply route daily. E11.9    . Promethazine-Codeine 6.25-10 MG/5ML SOLN TAKE 5 MLS BY MOUTH EVERY 4 (FOUR) HOURS AS NEEDED. 300 mL 0  . repaglinide (PRANDIN) 1 MG tablet Take 1  tablet (1 mg total) by mouth 3 (three) times daily before meals. 90 tablet 3  . budesonide-formoterol (SYMBICORT) 160-4.5 MCG/ACT inhaler Inhale 2 puffs into the lungs 2 (two) times daily. 3 Inhaler 3   No current facility-administered medications on file prior to visit.    Allergies  Allergen Reactions  . Aspirin Nausea And Vomiting    Uncoated aspirin Jittery GI upset  . Codeine Sulfate Nausea And Vomiting    Heart races.  . Glimepiride Diarrhea  . Metformin Diarrhea  . Propoxyphene N-Acetaminophen Nausea And Vomiting    Family History  Problem Relation Age of Onset  . Cancer Father        brain  . Diabetes Mother   . Heart attack Mother   . Diabetes Sister   . Diabetes Brother   . Hypertension Sister   . Hypertension Brother   . Aneurysm Other        niece  . Coronary artery disease Brother     BP 124/70   Pulse 76   Ht _0  (1.6 m)   Wt 203 lb 12.8 oz (92.4 kg)   SpO2 95%   BMI 36.10 kg/m     Review of Systems denies weight loss, blurry vision, chest pain, sob, n/v, urinary frequency, memory loss, and depression.       Objective:   Physical Exam VS: see vs page GEN: no distress HEAD: head: no deformity eyes: no periorbital swelling, no proptosis external nose and ears are normal NECK: supple, thyroid is not enlarged.   CHEST WALL: no deformity LUNGS: clear to auscultation.   CV: reg rate and rhythm, no murmur MUSCULOSKELETAL: muscle bulk and strength are grossly normal.  no obvious joint swelling.  gait is normal and steady EXTEMITIES: no deformity.  no ulcer on the feet.  feet are of normal color and temp.  no leg edema.  there is bilateral onychomycosis of the toenails.  Old healed surgical scar at the right foot.   PULSES: dorsalis pedis intact bilat.  no carotid bruit NEURO:  cn 2-12 grossly intact.   readily moves all 4's.  sensation is intact to touch on the feet, but decreased from normal.   SKIN:  Normal texture and temperature.  No rash or  suspicious lesion is visible.   NODES:  None palpable at the neck PSYCH: alert, well-oriented.  Does not appear anxious nor depressed.   Lab Results  Component Value Date   HGBA1C 10.6 (A) 03/31/2020   Lab Results  Component Value Date   CREATININE  0.98 01/12/2020   BUN 23 01/12/2020   NA 133 (L) 01/12/2020   K 4.6 01/12/2020   CL 98 01/12/2020   CO2 27 01/12/2020   I personally reviewed electrocardiogram tracing (11/13/17): Indication: sob Impression: NSR.  No MI.  No hypertrophy. Compared to 2013: no significant change      Assessment & Plan:  Type 2 DM, with PN: severe exacerbation.  She needs to start insulin.    Patient Instructions  good diet and exercise significantly improve the control of your diabetes.  please let me know if you wish to be referred to a dietician.  high blood sugar is very risky to your health.  you should see an eye doctor and dentist every year.  It is very important to get all recommended vaccinations.  Controlling your blood pressure and cholesterol drastically reduces the damage diabetes does to your body.  Those who smoke should quit.  Please discuss these with your doctor.  check your blood sugar once a day.  vary the time of day when you check, between before the 3 meals, and at bedtime.  also check if you have symptoms of your blood sugar being too high or too low.  please keep a record of the readings and bring it to your next appointment here (or you can bring the meter itself).  You can write it on any piece of paper.  please call us sooner if your blood sugar goes below 70, or if you have a lot of readings over 200. Please see a diabetes education specialist.  you will receive a phone call, about a day and time for an appointment.  She will go over the insulin pen with you.   Please come back for a follow-up appointment in 2 weeks.

## 2020-03-31 NOTE — Patient Instructions (Addendum)
good diet and exercise significantly improve the control of your diabetes.  please let me know if you wish to be referred to a dietician.  high blood sugar is very risky to your health.  you should see an eye doctor and dentist every year.  It is very important to get all recommended vaccinations.  Controlling your blood pressure and cholesterol drastically reduces the damage diabetes does to your body.  Those who smoke should quit.  Please discuss these with your doctor.  check your blood sugar once a day.  vary the time of day when you check, between before the 3 meals, and at bedtime.  also check if you have symptoms of your blood sugar being too high or too low.  please keep a record of the readings and bring it to your next appointment here (or you can bring the meter itself).  You can write it on any piece of paper.  please call us sooner if your blood sugar goes below 70, or if you have a lot of readings over 200. Please see a diabetes education specialist.  you will receive a phone call, about a day and time for an appointment.  She will go over the insulin pen with you.   Please come back for a follow-up appointment in 2 weeks.

## 2020-04-06 ENCOUNTER — Telehealth: Payer: Self-pay

## 2020-04-06 NOTE — Telephone Encounter (Signed)
LMTCB to reschedule 04/26/20 appointment-MD out of the office

## 2020-04-08 ENCOUNTER — Ambulatory Visit
Admission: EM | Admit: 2020-04-08 | Discharge: 2020-04-08 | Disposition: A | Discharge status: Home / Self Care | Payer: PPO | Attending: Emergency Medicine | Admitting: Emergency Medicine

## 2020-04-08 ENCOUNTER — Other Ambulatory Visit: Payer: Self-pay

## 2020-04-08 ENCOUNTER — Encounter: Payer: Self-pay | Admitting: Emergency Medicine

## 2020-04-08 DIAGNOSIS — G8929 Other chronic pain: Secondary | ICD-10-CM | POA: Diagnosis not present

## 2020-04-08 DIAGNOSIS — M25512 Pain in left shoulder: Secondary | ICD-10-CM | POA: Diagnosis not present

## 2020-04-08 MED ORDER — METHYLPREDNISOLONE SODIUM SUCC 125 MG IJ SOLR
125.0000 mg | Freq: Once | INTRAMUSCULAR | Status: AC
  Administered 2020-04-08: 125 mg via INTRAMUSCULAR

## 2020-04-08 NOTE — ED Provider Notes (Signed)
[facility]    CSN: [id number] Arrival date & time: 04/08/20  1601      History   Chief Complaint Chief Complaint  Patient presents with  . Shoulder Pain    HPI Rachel [name] is a 79 y.o. female with history of T2DM, HTN, OA presenting for left shoulder pain.  Notes crepitus at times.  Denies injury, numbness, weakness.  Sleeping on affected side, shoulder extension, and "rainy and cold weather" make it worse.  Denies chest pain, palpitations, cough, shortness of breath, neck pain, headaches, nausea, vomiting, abdominal pain.  Has taken ibuprofen "once every 2 weeks" without relief.   Past Medical History:  Diagnosis Date  . Allergic rhinitis   . Diabetes mellitus, type 2 ([place])   . Hyperlipemia   . Hypertension   . Lipoma NEC    anterior upper chest  . Osteoarthritis of knee     Patient Active Problem List   Diagnosis Date Noted  . Candidiasis of skin 03/17/2019  . 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 Mild cardiomegaly 01/03/2018  . Well adult exam 12/14/2015  . Acute UTI 12/14/2015  . 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 Bacterial pharyngitis 08/25/2014  . Acute bronchitis 07/03/2014  . Oral thrush 07/03/2014  . Onychomycosis 02/18/2013  . URI (upper respiratory infection) 07/11/2012  . Cough 07/11/2012  . Ganglion cyst 06/25/2011  . Foot pain, left 06/25/2011  . Otitis media 06/25/2011  . LIPOMA 11/03/2010  . EUSTACHIAN TUBE DYSFUNCTION, RIGHT 02/18/2010  . Asthma with acute exacerbation 11/02/2009  . ONYCHOMYCOSIS, TOENAILS 05/18/2009  . Dyslipidemia 09/15/2008  . DM2 (diabetes mellitus, type 2) ([place]) 04/19/2007  . Essential hypertension 04/19/2007  . Allergic rhinitis 04/19/2007  . OSTEOARTHRITIS 04/19/2007    Past Surgical History:  Procedure Laterality Date  . ACNE CYST REMOVAL  1970   back  . APPENDECTOMY  1963  . OOPHORECTOMY  1985  . VAGINAL HYSTERECTOMY  1985   complete    OB History   No obstetric history on file.       Home Medications    Prior to Admission medications   Medication Sig Start Date End Date Taking? Authorizing Provider  albuterol (VENTOLIN HFA) 108 (90 Base) MCG/ACT inhaler INHALE 1-2 PUFFS INTO THE LUNGS EVERY 6 (SIX) HOURS AS NEEDED FOR WHEEZING OR SHORTNESS OF BREATH. 06/23/19   Plotnikov, Evie Lacks, MD  ammonium lactate (AMLACTIN) 12 % cream Apply topically as needed for dry skin. 01/20/19   Gardiner Barefoot, DPM  Blood Glucose Monitoring Suppl (ONETOUCH VERIO REFLECT) w/Device KIT 1 each by Does not apply route daily. E11.9    [provider]  budesonide-formoterol (SYMBICORT) 160-4.5 MCG/ACT inhaler Inhale 2 puffs into the lungs 2 (two) times daily. 08/06/18 08/06/19  Plotnikov, Evie Lacks, MD  fluticasone (FLONASE) 50 MCG/ACT nasal spray Place 2 sprays into both nostrils daily. 06/03/18   Marrian Salvage, FNP  Glucose Blood (ONETOUCH VERIO VI) 1 each by Other route daily. E11.9    [provider]  ibuprofen (ADVIL,MOTRIN) 600 MG tablet TAKE 1 TABLET (600 MG TOTAL) BY MOUTH 2 (TWO) TIMES DAILY AS NEEDED 12/18/18   Plotnikov, Evie Lacks, MD  loratadine (CLARITIN) 10 MG tablet Take 1 tablet (10 mg total) by mouth daily. 01/21/20   Plotnikov, Evie Lacks, MD  losartan-hydrochlorothiazide (HYZAAR) 100-25 MG tablet TAKE 1 TABLET BY MOUTH EVERY DAY 05/10/19   Plotnikov, Evie Lacks, MD  nystatin (MYCOSTATIN/NYSTOP) powder Apply topically 4 (four) times daily. 03/17/19   Binnie Rail, MD  nystatin cream (MYCOSTATIN) Apply 1 application topically 2 (two) times daily. 03/17/19   Binnie Rail, MD  OneTouch Delica Lancets 11H MISC 1 each by Does not apply route daily. E11.9    [provider]  Promethazine-Codeine 6.25-10 MG/5ML SOLN TAKE 5 MLS BY MOUTH EVERY 4 (FOUR) HOURS AS NEEDED. 11/27/18   Plotnikov, Evie Lacks, MD  repaglinide (PRANDIN) 1 MG tablet Take 1 tablet (1 mg total) by mouth 3 (three) times daily before meals. 01/21/20   Plotnikov, Evie Lacks, MD    Family  History Family History  Problem Relation Age of Onset  . Cancer Father        brain  . Diabetes Mother   . Heart attack Mother   . Diabetes Sister   . Diabetes Brother   . Hypertension Sister   . Hypertension Brother   . Aneurysm Other        niece  . Coronary artery disease Brother     Social History Social History   Tobacco Use  . Smoking status: Never Smoker  . Smokeless tobacco: Never Used  Substance Use Topics  . Alcohol use: No    Alcohol/week: 0.0 standard drinks  . Drug use: No     Allergies   Aspirin, Codeine sulfate, Glimepiride, Metformin, and Propoxyphene n-acetaminophen   Review of Systems As per HPI   Physical Exam Triage Vital Signs ED Triage Vitals  Enc Vitals Group     BP      Pulse      Resp      Temp      Temp src      SpO2      Weight      Height      Head Circumference      Peak Flow      Pain Score      Pain Loc      Pain Edu?      Excl. in [place]?    No data found.  Updated Vital Signs BP (!) 206/82   Pulse 71   Temp 98.3 F (36.8 C)   Resp 18   SpO2 95%   Visual Acuity Right Eye Distance:   Left Eye Distance:   Bilateral Distance:    Right Eye Near:   Left Eye Near:    Bilateral Near:     Physical Exam Constitutional:      General: She is not in acute distress. HENT:     Head: Normocephalic and atraumatic.  Eyes:     General: No scleral icterus.    Pupils: Pupils are equal, round, and reactive to light.  Cardiovascular:     Rate and Rhythm: Normal rate.  Pulmonary:     Effort: Pulmonary effort is normal.  Musculoskeletal:        General: Tenderness present. No swelling or deformity.     Comments: Left shoulder with decreased extension as compared to right.  AC joint tenderness without deformity or effusion.  Patient also has left trapezius tenderness without hypertrophy, spasm.  Otherwise unremarkable  Skin:    Capillary Refill: Capillary refill takes less than 2 seconds.     Coloration: Skin is not  jaundiced or pale.  Neurological:     General: No focal deficit present.     Mental Status: She is alert and oriented to person, place, and time.      UC Treatments / Results  Labs (all labs ordered are listed, but only abnormal results are displayed) Labs  Reviewed - No data to display  EKG   Radiology No results found.  Procedures Procedures (including critical care time)  Medications Ordered in UC Medications - No data to display  Initial Impression / Assessment and Plan / UC Course  I have reviewed the triage vital signs and the nursing notes.  Pertinent labs & imaging results that were available during my care of the patient were reviewed by me and considered in my medical decision making (see chart for details).     Patient without neurovascular compromise or injury: Radiography deferred.  H&P concerning for arthritis VS bursitis.  Solu-Medrol given in office: Patient tolerated well.  Will have patient follow-up with orthopedics for further evaluation and management if needed.  Return precautions discussed, pt verbalized understanding and is agreeable to plan. Final Clinical Impressions(s) / UC Diagnoses   Final diagnoses:  Chronic left shoulder pain     Discharge Instructions     RICE: rest, ice, compression, elevation as needed for pain.   Cold therapy (ice packs) can be used to help swelling both after injury and after prolonged use of areas of chronic pain/aches.  Pain medication:  Tylenol  Important to follow up with specialist(s) below for further evaluation/management if your symptoms persist or worsen.    ED Prescriptions    None     I have reviewed the PDMP during this encounter.   Hall[place]Potvin, [place], [place] 04/08/20 [phone]

## 2020-04-08 NOTE — ED Triage Notes (Signed)
Pt c/o left shoulder pain x 6 weeks, "I hear a cracking noise when I move it." Denies injury

## 2020-04-08 NOTE — Discharge Instructions (Addendum)
RICE: rest, ice, compression, elevation as needed for pain.   Cold therapy (ice packs) can be used to help swelling both after injury and after prolonged use of areas of chronic pain/aches.  Pain medication:  Tylenol  Important to follow up with specialist(s) below for further evaluation/management if your symptoms persist or worsen.

## 2020-04-13 ENCOUNTER — Ambulatory Visit: Payer: PPO | Admitting: Nutrition

## 2020-04-14 ENCOUNTER — Other Ambulatory Visit: Payer: Self-pay | Admitting: Internal Medicine

## 2020-04-26 ENCOUNTER — Ambulatory Visit: Payer: PPO | Admitting: Endocrinology

## 2020-04-27 ENCOUNTER — Other Ambulatory Visit: Payer: Self-pay | Admitting: Internal Medicine

## 2020-05-04 ENCOUNTER — Ambulatory Visit: Payer: PPO | Admitting: Nutrition

## 2020-05-16 DIAGNOSIS — M25512 Pain in left shoulder: Secondary | ICD-10-CM | POA: Diagnosis not present

## 2020-05-16 DIAGNOSIS — M7542 Impingement syndrome of left shoulder: Secondary | ICD-10-CM | POA: Diagnosis not present

## 2020-05-20 DIAGNOSIS — M25512 Pain in left shoulder: Secondary | ICD-10-CM | POA: Diagnosis not present

## 2020-05-23 DIAGNOSIS — M25512 Pain in left shoulder: Secondary | ICD-10-CM | POA: Diagnosis not present

## 2020-06-09 ENCOUNTER — Other Ambulatory Visit: Payer: Self-pay

## 2020-06-09 NOTE — Patient Outreach (Signed)
Aging Gracefully Program  06/09/2020  Rachel [name] 07-09-1941 [id number]   [facility] Evaluation Interviewer attempted to call patient on today regarding Aging Gracefully referral. Patient answered and I tried to explain the reason for my call. Phone call was disconnected shortly after or patient hung up. Will attempt another outreach tomorrow.  Will attempt to call back within 1 week.   [facility] Management Assistant

## 2020-06-29 ENCOUNTER — Other Ambulatory Visit: Payer: Self-pay

## 2020-06-29 NOTE — Patient Outreach (Signed)
Aging Gracefully Program  06/29/2020  Celia Friedland Sienkiewicz [birth year] [id number]  [facility] Evaluation Interviewer made contact with patient. Aging Gracefully survey completed.   Interviewer will send referral to OT for follow up.   [facility] Management Assistant

## 2020-07-04 ENCOUNTER — Other Ambulatory Visit: Payer: Self-pay | Admitting: Internal Medicine

## 2020-07-04 DIAGNOSIS — I10 Essential (primary) hypertension: Secondary | ICD-10-CM

## 2020-07-20 ENCOUNTER — Other Ambulatory Visit: Payer: Self-pay

## 2020-07-20 ENCOUNTER — Other Ambulatory Visit: Payer: Self-pay | Admitting: Specialist

## 2020-07-20 NOTE — Patient Outreach (Signed)
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  Since she is no longer working, her routine has changed and she feels this has affected her sleep and energy level.  She typically wakes up around 9 am and sometimes picks up her grandchildren from school.  She sometimes goes to the mall to walk laps.  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, fatigued Do You Have Time For Yourself?: yes Patient Reported Equipment: Patient Reported Equipment Currently Used: Long Handle Sponge Functional Mobility-Walking Indoors/Getting Around the [employer]:   Functional Mobility-Walk A Block: Walk A Block: A Little Difficulty Do You:: No Device/No Assistance Importance Of Learning New Strategies:: Not At All Functional Mobility-Maintain Balance While Showering: Maintaining Balance While Showering: A Little Difficulty Do You:: No Device/No Assistance Importance Of Learning New Strategies:: Very Much Other Comments:: needs a grabbar Observation: Maintain Balance While Showering: Independent With Pain, Difficulty, Or Use Of Device Safety: A Little Risk Efficiency: Somewhat Intervention: Yes Functional  Mobility-Stooping, Crouching, Kneeling To Retreive Item: Stooping, Crouching, or Kneeling To Retrieve Item: Moderate Difficulty Do You:: No Device/No Assistance Importance Of Learning New Strategies:: Very Much Other Comments:: needs a reacher Observation: Stoop, Crouch, or Kneel: Independent With Pain, Difficulty, Or Use Of Device Safety: A Little Risk Efficiency: Somewhat Intervention: Yes Functional Mobility-Bending From Standing Position To Pick Up Clothing Off The Floor: Bending Over From Standing Position To Pick Up Clothing Off The Floor: A Little Difficulty Do You:: No Device/No Assistance Importance Of Learning New Strategies:: Not At All Functional Mobility-Reaching For Items Above Shoulder Level: Reaching For Items Above Shoulder Level: A Little Difficulty Do You:: No Device/No Assistance Importance Of Learning New Strategies:: Moderate Observation: Reaching For Items Above Shoulder Level: Independent With Pain, Difficulty, Or Use Of Device Safety: A Little Risk Efficiency: Somewhat Intervention: Yes Functional Mobility-Climb 1 Flight Of Stairs: Climb 1 Flight Of Stairs: A Little Difficulty Do You:: No Device/No Assistance Importance Of Learning New Strategies:: Moderate Observation: Climb One Flight Of Stairs: Independent With Pain, Difficulty, Or Use Of Device Safety: A Little Risk Efficiency: Somewhat Intervention: Yes Functional Mobility-Move In And Out Of Chair: Move In and Out Of A Chair: A Little Difficulty Do You:: No Device/No Assistance Importance Of Learning New Strategies:: Not At All Observation: Move In And Out Of Chair: Independent Functional Mobility-Move In And Out Of Bed: Move In and Out Of Bed: No Difficulty Functional Mobility-Move In And Out Of Bath/Shower: Move In And Out Of A Bath/Shower: A Little Difficulty Do You:: No Device/No Assistance Importance Of Learning New Strategies:: Very Much Observation: Move In And Out Of Bath/Shower:  Independent With Pain, Difficulty, Or Use Of Device Safety: A Little Risk Efficiency: Somewhat Intervention: Yes Functional Mobility-Get On And Off Toilet: Getting Up From The Floor: A Little Difficulty Do You:: N/A- Not Applicable Importance Of Learning New Strategies:: Very Much Functional Mobility-Into And Out Of Car, Not Including Driving: Into  And Out Of Car, Not Including Driving: A Little Difficulty Do You:: No Device/No Assistance Importance Of Learning New Strategies:: A Little Observation: Into And Out Of Car, Not Including Driving: Independent With Pain, Difficulty, Or Use Of Device Safety: A Little Risk Efficiency: Somewhat Intervention: Yes Functional Mobility-Other Mobility Difficulty:      Activities of Daily Living-Bathing/Showering: ADL-Bathing/Showering: No Difficulty Activities of Daily Living-Personal Hygiene and Grooming: Personal Hygiene and Grooming: No Difficulty Activities of Daily Living-Toilet Hygiene: Toilet Hygiene: No Difficulty Activities of Daily Living-Put On And Take Off Undergarments (Incl. Fasteners): Put On And Take Off Undergarments (Incl. Fasteners): No Difficulty Activities of Daily Living-Put On And Take Off Shirt/Dress/Coat (Incl. Fasteners): Put On And Take Off Shirt/Dress/Coat (Incl. Fasteners): No Difficulty Activities of Daily Living-Put On And Take Off Socks And Shoes: Put On And Take Off Socks And  Shoes: No Difficulty Activities of Daily Living-Feed Self: Feed Self: No Difficulty Activities of Daily Living-Rest And Sleep: Rest and Sleep: A Little Difficulty Do You:: No Device/No Assistance Importance Of Learning New Strategies: A Little Other Comments:: recommended establishing a set wake and sleep time and avoiding watching tv before bed - follow up at next visit Activities of Daily Living-Sexual Activity: Sexual  Activity: N/A Activities of Daily Living-Other Activity Identified:    Instrumental Activities of Daily  Living-Light Homemaking (Laundry, Straightening Up, Vacuuming):  Do Light Homemaking (Laundry, Straightening Up, Vacuuming): No Difficulty Instrumental Activities of Daily Living-Making A Bed: Making a Bed: No Difficulty Instrumental Activities of Daily Living-Washing Dishes By Hand While Standing At The Sink: Washing Dishes By Hand While Standing At The Sink: No Difficulty Instrumental Activities of Daily Living-Grocery Shopping: Do Grocery Shopping: No Difficulty Instrumental Activities of Daily Living-Use Telephone: Use Telephone: No Difficulty Instrumental Activities of Daily Living-Financial Management: Financial Management: No Difficulty Instrumental Activities of Daily Living-Medications: Take Medications: No Difficulty Instrumental Activities of Daily Living-Health Management And Maintenance: Health Management & Maintenance: No Difficulty Instrumental Activities of Daily Living-Meal Preparation and Clean-Up: Meal Preparation and Clean-Up: No Difficulty Instrumental Activities of Daily Living-Provide Care For Others/Pets: Care For Others/Pets: No Difficulty Instrumental Activities of Daily Living-Take Part In Organized Social Activities: Take Part In Organized Social Activities: No Difficulty Instrumental Activities of Daily Living-Leisure Participation: Leisure Participation: No Difficulty Instrumental Activities of Daily Living-Employment/Volunteer Activities: Employment/Volunteer Activities: A Little Difficulty Other Comments:: misses part time work and income, would consider looking for work Instrumental Activities of Daily Living-Other Identifies:    Readiness To Change Score:  Readiness to Change Score: 9.33  Home Environment Assessment: Outside Home Entry:: WFL - sturdy handrails at all entrances and steps Entryway/Foyer:: linoleum from entryway through living room into kitchen is loose from the floor and is a tripping hazard Dining Room:: n/a Living Room::  n/a Kitchen:: linoleum is a trip hazard.  sub floor in front of sink is very soft and is a fall hazard. Stairs:: steps to basement are steep and tread is narrow.  handrail is not long enough to use on top step.  stairs to 2nd floor are steep and narrow, one step is loose, hand rail is loose and would benefit from an addiitonal hand rail on opposite wall Bathroom:: needs a grab  bar to enter tub/shower and one by toilet.  toilet needs replaced with a comfort level commode.  could use a hand held shower and a shower seat with back Master Bedroom:: water spots in ceiling Laundry:: WFL Basement:: n/a Hallways:: n/a Smoke/CO2 Detector:: n/a Veterinary surgeon:: n/a Mailbox:: n/a Other Home Environment Concerns:: rotten wood in Sports coach:    Patient Education: Education Provided: Yes Education Details: reviewed goals, plan of care, fall prevention in her home Person(s) Educated: Patient Comprehension: Verbalized Understanding, Other (comment) (hand out provided)  Goals: Goals Addressed            This Visit's Progress   . Patient will improve ease and safety entering and exiting car.      . Patient will improve ease with retrieving items from the floor and from overhead.      . Patient will improve safety and independence with bathroom transfers and tasks.      . Patient will improve safety when ambulating in home.         Post Clinical Reasoning: Clinician View Of Client Situation:: Patient is aware that as she is aging, she is needing to pace herself and prepare her home to avoid falls.  She is open to education provided by therapist and even commented that the topics we discussed are very relevant to her, and others her age.  She is aware of changes that need to be made and I think a gentle prompt from the [facility] team will propel her forward towards making changes she knows she needs to make. Client View Of His/Her Situation:: Realizes that she is not moving as  quickly as she used to, has begun to apply energy conservation techniques independently to her daily tasks and is requesting additional information form professional staff Of note, patient reports that her blood sugar was 230 this morning and blood pressure was 178/80. Patient took blood pressure again at end of session and it had elevated to 188/80 despite taking blood pressure medication.  Patient also reports that she had blurred vision in am, however this had resolved.  She also states that she has been experiencing a flutter sensation in her heart over the past 4-6 weeks.  OT recommended that she call her MD office to report these findings and follow MD directions.  Patient agreed to do so.  OT did not contact MD personally as patient stated she was feeling better.  OT will contact patient tomorrow to follow up on her blood pressure, blood sugar, etc. Next Visit Plan:: problem solve goal 1 - improve safety retrieving items from overhead or off of the floor. [name], [place], OTR/L [phone]

## 2020-08-10 ENCOUNTER — Other Ambulatory Visit: Payer: Self-pay | Admitting: Specialist

## 2020-08-10 ENCOUNTER — Other Ambulatory Visit: Payer: Self-pay

## 2020-08-10 NOTE — Patient Outreach (Signed)
Aging Gracefully Program  OT Follow-Up Visit  08/10/2020  Shereka Lafortune [facility] November 27, 1940 [id number]  Visit:  2- Second Visit  Start Time:  0932 End Time:  6712 Total Minutes:  50  Durable Medical Equipment: Adaptive Equipment: Reacher, Stepstool (stepstool ordered today and will be delivered by 11/20) Adaptive Equipment Distribution Date: 08/10/20  Patient Education: Education Provided: Yes Education Details: reviewed goals, plan of care, how to safely get up from a fall, and educated on use of reacher and step stool for increased ease and safety when retrieving items from overhead cabinets or floor Person(s) Educated: Patient Comprehension: Verbalized Understanding, Other (comment) (hand out provided)  Goals:  Goals Addressed            This Visit's Progress   . Patient will improve ease with retrieving items from the floor and from overhead.   On track    ACTION PLANNING - CUSTOM  Target Problem Area:  difficulty retrieving items from overhead cabinets and from floor.   Why Problem May Occur: Height of patient, height of cabinets Objects on floor in kitchen not allowing patient to get close to cabinet Pain in left shoulder makes reaching difficulty Leaning over too long makes her dizzy   Target Goal:  safely retrieve items from overhead cabinet and from floor    STRATEGIES Saving Your Energy: DO: DON'T:  Move frequently used items to bottom shelf or countertop. Over reach - can injure your back or shoulder  Keep frequently used pots/pans on counter/stove and dishes in dishdrainer Keep frequently used items in difficult to reach places  Use reacher to pick up items from out of reach cabinets or from floor Use chair as a stepstool  Use stepstool to reach into overhead cabinets   Stand square to cabinet when reaching overhead and to side of cabinet when reaching into low cabinets    Modifying your home environment and making it safe: DO: DON'T:                   Simplifying the way you set up tasks or daily routines: DO: DON'T:            Practice It is important to practice the strategies so we can determine if they will be effective in helping to reach your goal. Follow these specific recommendations: 1.  Use reacher and step stool to access shelves that are too high or low. 2.    Move items from 3rd shelf to 1st shelf or counter top. 3.  If a strategy does not work the first time, try it again and again (and maybe again). We may make some changes over the next few sessions, based on how they work.   [name], MHA, OTR/L [phone]          Post Clinical Reasoning: Client Action (Goal) One Interventions: Improve safety and independence when retrieving items from overhead cabinets or floor Did Client Try?: Yes Targeted Problem Area Status: A Little Better Clinician View Of Client Situation:: Ms. Mondor is very motivated to improve her safety in her home.  The education provided today has really motivated her to begin transitioning items in her kitchen.  She also was very receptive to OT on helpfulness of "how to get up from a fall" brochure.  Patient shared with OT that she fell this summer at her granddaughters home and had trouble getting up, and info provided today would have really helped her. Client View Of His/Her Situation:: "I feel smarter"  after educated provided on techniques to improve safety when retrieving items from overhead and how to get up safely from a fall. Next Visit Plan:: follow up on reacher and step stool use, problem solve goal 2 - how to improve safety when getting in and out of her car. [name], [place], OTR/L [phone]

## 2020-08-11 ENCOUNTER — Other Ambulatory Visit: Payer: Self-pay

## 2020-08-11 NOTE — Patient Outreach (Signed)
Aging Gracefully Program  08/11/2020  Rachel [name] On 15-Jan-1941 [id number]   Care Coordination:  Placed call to patient to schedule first home visit.   Patient answered and reports I can come on 08/15/2020 at 11 am.   Confirmed address.   Tomasa Rand, RN, BSN, CEN [facility] [employer] [phone]

## 2020-08-15 ENCOUNTER — Other Ambulatory Visit: Payer: Self-pay

## 2020-08-16 NOTE — Patient Outreach (Signed)
Aging Gracefully Program  RN Visit  08/16/2020  Rachel [name] [facility] 06/18/1941 [id number]  Visit:   RN home visit #1  Start Time:   1100 End Time:   1230 Total Minutes:   90  Readiness To Change Score:     Universal RN Interventions: Calendar Distribution: Yes Exercise Review: No Medications: Yes Medication Changes: Yes Mood: Yes Pain: Yes PCP Advocacy/Support: No Fall Prevention: Yes Incontinence: Yes Clinician View Of Client Situation: Patient answered door without issues.  Noted big step ( the first step) to go upstairs. Patient is alert and oriented and happy to be getting some help with her home. Good family support with kids and grandkids. Client View Of His/Her Situation: Reports she is weaker due to no longer working and staying home due to covid. Reports she has left shoulder pain and can not raise her shoulder very high. Reports she has recently had problems with her Dm and needs more education about Dm.  PCP send to specalist.  Reports fasting CBG today of 215.   Healthcare Provider Communication:none    Clinician View of Client Situation: Clinician View Of Client Situation: Patient answered door without issues.  Noted big step ( the first step) to go upstairs. Patient is alert and oriented and happy to be getting some help with her home. Good family support with kids and grandkids. Client's View of His/Her Situation: Client View Of His/Her Situation: Reports she is weaker due to no longer working and staying home due to covid. Reports she has left shoulder pain and can not raise her shoulder very high. Reports she has recently had problems with her Dm and needs more education about Dm.  PCP send to specalist.  Medication Assessment: Do You Have Any Problems Paying For Medications?: Yes (spends 40.00 per month for medications.) Where Does Client Store Medications?: Kitchen Table Can Client Read Pill Bottles?: Yes Does Client Use A Pillbox?: No Does Anyone  Assist Client In Taking Medications?: No Do You Take Vitamin D?: Yes Does Client Have Any Questions Or Concerns About Medictions?: No Is Client Complaining Of Any Symptoms That Could Be Side Effects To Medications?: No Any Possible Changes In Medication Regimen?: No  Outpatient Encounter Medications as of 08/15/2020  Medication Sig  . albuterol (VENTOLIN HFA) 108 (90 Base) MCG/ACT inhaler INHALE 1-2 PUFFS INTO THE LUNGS EVERY 6 (SIX) HOURS AS NEEDED FOR WHEEZING OR SHORTNESS OF BREATH.  [name] Blood Glucose Monitoring Suppl (ONETOUCH VERIO REFLECT) w/Device KIT 1 each by Does not apply route daily. E11.9  . fluticasone (FLONASE) 50 MCG/ACT nasal spray Place 2 sprays into both nostrils daily.  . Glucose Blood (ONETOUCH VERIO VI) 1 each by Other route daily. E11.9  . ibuprofen (ADVIL) 600 MG tablet TAKE 1 TABLET BY MOUTH 2 TIMES DAILY AS NEEDED  . loratadine (CLARITIN) 10 MG tablet Take 1 tablet (10 mg total) by mouth daily.  [name] losartan-hydrochlorothiazide (HYZAAR) 100-25 MG tablet TAKE 1 TABLET BY MOUTH EVERY DAY  . Multiple Vitamins-Minerals (HAIR SKIN AND NAILS FORMULA) TABS Take 1 tablet by mouth daily.  Glory Rosebush Delica Lancets 57Q MISC 1 each by Does not apply route daily. E11.9  . repaglinide (PRANDIN) 1 MG tablet TAKE 1 TABLET (1 MG TOTAL) BY MOUTH 3 (THREE) TIMES DAILY BEFORE MEALS.  [name] ammonium lactate (AMLACTIN) 12 % cream Apply topically as needed for dry skin. (Patient not taking: Reported on 08/15/2020)  . budesonide-formoterol (SYMBICORT) 160-4.5 MCG/ACT inhaler Inhale 2 puffs into the lungs 2 (two) times daily.  [name]  nystatin (MYCOSTATIN/NYSTOP) powder Apply topically 4 (four) times daily. (Patient not taking: Reported on 08/15/2020)  . nystatin cream (MYCOSTATIN) Apply 1 application topically 2 (two) times daily. (Patient not taking: Reported on 08/15/2020)  . Promethazine-Codeine 6.25-10 MG/5ML SOLN TAKE 5 MLS BY MOUTH EVERY 4 (FOUR) HOURS AS NEEDED. (Patient not taking: Reported on  08/15/2020)   No facility-administered encounter medications on file as of 08/15/2020.     Session Summary: Very pleasant patient interested in improving her strength and DM education.   Goals Addressed              This Visit's Progress   .  Patient Stated (pt-stated)        Aging Gracefully: Patient reports she want to improve her strength in the next 90 days.   Interventions: Reviewed with patient the importance of being active. Reviewed with patient that I will provide an exercise plan and demonstrate home exercises at the next home visit.   Tomasa Rand, RN, BSN, CEN [facility] [phone]       Next home visit planned for 09/05/2020 Tomasa Rand, RN, BSN, CEN [facility] [employer] [phone]

## 2020-09-06 ENCOUNTER — Encounter: Payer: Self-pay | Admitting: Specialist

## 2020-09-08 ENCOUNTER — Other Ambulatory Visit: Payer: Self-pay

## 2020-09-08 DIAGNOSIS — H43812 Vitreous degeneration, left eye: Secondary | ICD-10-CM | POA: Diagnosis not present

## 2020-09-08 DIAGNOSIS — E113293 Type 2 diabetes mellitus with mild nonproliferative diabetic retinopathy without macular edema, bilateral: Secondary | ICD-10-CM | POA: Diagnosis not present

## 2020-09-08 DIAGNOSIS — H40023 Open angle with borderline findings, high risk, bilateral: Secondary | ICD-10-CM | POA: Diagnosis not present

## 2020-09-08 DIAGNOSIS — H35033 Hypertensive retinopathy, bilateral: Secondary | ICD-10-CM | POA: Diagnosis not present

## 2020-09-08 NOTE — Patient Outreach (Signed)
Aging Gracefully Program  RN Visit  09/08/2020  Rachel [name] [facility] 1941/01/26 [id number]  Visit:   RN home visit #2  Start Time:   1050 End Time:   1130 Total Minutes:   40   Readiness To Change Score:     Universal RN Interventions: Calendar Distribution: Yes Exercise Review: Yes Medications: Yes Medication Changes: Yes Mood: Yes Pain: Yes Clinician View Of Client Situation: Ambuatory to door.  Patient appears not herself today.  Reports she feels bad.  Home decorated for Christmas. Modifications not started yet. Patient is alert. Client View Of His/Her Situation: Patient reports to me that she does not feel good today.  Reports she is havig problems with her left eye. Reports she feels like she has a fly in her face.  Reports " floaters" started 3 days ago.  Reports she does not know who her eye doctor is.  Reviewed chart and referral to Dr. Katy Fitch on 04/16/2017. Patient reports she has been off her DM medications an allergy medications for 2 week.  Reports she got her RX yesterday when she got here monthy check. Reports CBG is 261 today.  Reports she is trying to follow her diet.  Healthcare Provider Communication: Did Higher education careers adviser With [employer] Provider?: Yes Method Of Communication: Telephone Healthcare Provider Response According to RN: Placed call to Dr. Patrici Ranks office .  Reviewed concern for new eye floaters.  Seecured urgent MD appointment for today at 1:30 pm.  Clinician View of Client Situation: Clinician View Of Client Situation: Ambuatory to door.  Patient appears not herself today.  Reports she feels bad.  Home decorated for Christmas. Modifications not started yet. Patient is alert. Client's View of His/Her Situation: Client View Of His/Her Situation: Patient reports to me that she does not feel good today.  Reports she is havig problems with her left eye. Reports she feels like she has a fly in her face.  Reports " floaters" started 3 days ago.   Reports she does not know who her eye doctor is.  Reviewed chart and referral to Dr. Katy Fitch on 04/16/2017. Patient reports she has been off her DM medications an allergy medications for 2 week.  Reports she got her RX yesterday when she got here monthy check. Reports CBG is 261 today.  Reports she is trying to follow her diet.  Medication Assessment: no changes in medications. Patient confirms she has all her medications and is taking them at this time.      Session Summary: Urgent appointment to eye doctor today at 1:30 for evaluation of new floaters.  Reviewed exercise plan. Next home visit planned for 10/06/2019 at 11am.  Goals    .  Exercise 150 minutes per week (moderate activity)      Will try to go to community center and walk around trail  Will try to walk and build up to 30 minutes a day; especially;     .  Patient Stated (pt-stated)      Aging Gracefully: Patient reports she want to improve her strength in the next 90 days.   Interventions: Reviewed with patient the importance of being active. Reviewed with patient that I will provide an exercise plan and demonstrate home exercises at the next home visit.   Tomasa Rand, RN, BSN, CEN [place] Coordinator [phone]   09/08/2020  Home visit completed.  No changes in strength.  Interventions:  Provided printed exercise plan. Review and demonstrated home exercise. Encouraged daily compliance to complete exercises.  Patient voiced understanding.    Tomasa Rand, RN, BSN, CEN [facility] [employer] [phone]     .  Patient will improve ease and safety entering and exiting car.    .  Patient will improve ease with retrieving items from the floor and from overhead.      ACTION PLANNING - CUSTOM  Target Problem Area:  difficulty retrieving items from overhead cabinets and from floor.   Why Problem May Occur: Height of patient, height of cabinets Objects on floor in kitchen not allowing patient to get close to  cabinet Pain in left shoulder makes reaching difficulty Leaning over too long makes her dizzy   Target Goal:  safely retrieve items from overhead cabinet and from floor    STRATEGIES Saving Your Energy: DO: DON'T:  Move frequently used items to bottom shelf or countertop. Over reach - can injure your back or shoulder  Keep frequently used pots/pans on counter/stove and dishes in dishdrainer Keep frequently used items in difficult to reach places  Use reacher to pick up items from out of reach cabinets or from floor Use chair as a stepstool  Use stepstool to reach into overhead cabinets   Stand square to cabinet when reaching overhead and to side of cabinet when reaching into low cabinets    Modifying your home environment and making it safe: DO: DON'T:                  Simplifying the way you set up tasks or daily routines: DO: DON'T:            Practice It is important to practice the strategies so we can determine if they will be effective in helping to reach your goal. Follow these specific recommendations: 1.  Use reacher and step stool to access shelves that are too high or low. 2.    Move items from 3rd shelf to 1st shelf or counter top. 3.  If a strategy does not work the first time, try it again and again (and maybe again). We may make some changes over the next few sessions, based on how they work.   [name], [place], OTR/L [phone]       .  Patient will improve safety and independence with bathroom transfers and tasks.    .  Patient will improve safety when ambulating in home.    .  retire and do more traveling, enjoy life.        Next home visit planned 10/05/2020  At 11am.  Enocuraged patient to call me if needed.  Tomasa Rand, RN, BSN, CEN [facility] [employer] [phone]

## 2020-09-14 ENCOUNTER — Other Ambulatory Visit: Payer: Self-pay | Admitting: Specialist

## 2020-09-14 ENCOUNTER — Other Ambulatory Visit: Payer: Self-pay

## 2020-09-14 NOTE — Patient Outreach (Signed)
Aging Gracefully Program  OT Follow-Up Visit  09/14/2020  Bobbiejo Ishikawa [facility] [birth year] [id number]  Visit:  3- Third Visit  Start Time:  2683 End Time:  1220 Total Minutes:  35     Durable Medical Equipment: Adaptive Equipment: Other (auto handy bar) Adaptive Equipment Distribution Date: 09/14/20  Patient Education: Education Provided: Yes Education Details: educated patient on how to use auto handybar, ergonomic techniques for entering and exiting the car Person(s) Educated: Patient Comprehension: Verbalized Understanding,Returned Demonstration  Goals:  Goals Addressed            This Visit's Progress   . Patient will improve ease and safety entering and exiting car.       ACTION PLANNING - CUSTOM  Target Problem Area:  difficult to get out of car   Why Problem May Occur:  Car is low to the ground Nothing to hold onto to help get out Joints are stiff      Target Goal: Exit car with less difficulty and improved safety and independence    STRATEGIES Saving Your Energy: DO: DON'T:  Use autohandy bar to assist with entering and exiting car Drop into car or pull on car door when exiting car.   When entering car, sit and then swing legs in, when exiting, swing both legs out and then come to standing  Step legs one by one in and out of the car.            Modifying your home environment and making it safe: DO: DON'T:                  Simplifying the way you set up tasks or daily routines: DO: DON'T:            Practice It is important to practice the strategies so we can determine if they will be effective in helping to reach your goal. Follow these specific recommendations: 1.  Use auto handybar for improved independence and safety when transferring in and out of car.  2. 3.  If a strategy does not work the first time, try it again and again (and maybe again). We may make some changes over the next few sessions, based on how they  work.  [name], MHA, OTR/L [phone]          Post Clinical Reasoning: Client Action (Goal) One Interventions: Improve safety and independence when transferring in and out of the car. Did Client Try?: Yes Targeted Problem Area Status: A Lot Better Clinician View Of Client Situation:: Ms. Sanagustin has been utilizing reach and step stool and has gone back to work in the mornings 2 days per week, which has vastly improved her energy level, sleep habits, and daily routine.  As in previous visits, Ms. Coles is receptive to modifications to task that OT provides and is quick to implement them into her daily routine. Client View Of His/Her Situation:: Job has improved her routine, sleepiness, and energy level.  She is excited about use of the automobile handybar, and is able to independently voice that it is not only making her safer, it is using less energy and effort as well. Next Visit Plan:: follow up on automobile handybar and problem solve goals 3 and 4:  improving safety with home mobility and bathroom transfers. [name], [place], OTR/L [phone]

## 2020-09-21 ENCOUNTER — Other Ambulatory Visit: Payer: Self-pay

## 2020-09-21 ENCOUNTER — Telehealth: Payer: Self-pay | Admitting: Emergency Medicine

## 2020-09-21 ENCOUNTER — Ambulatory Visit
Admission: EM | Admit: 2020-09-21 | Discharge: 2020-09-21 | Disposition: A | Discharge status: Home / Self Care | Payer: PPO | Attending: Emergency Medicine | Admitting: Emergency Medicine

## 2020-09-21 DIAGNOSIS — J069 Acute upper respiratory infection, unspecified: Secondary | ICD-10-CM | POA: Diagnosis not present

## 2020-09-21 DIAGNOSIS — Z7689 Persons encountering health services in other specified circumstances: Secondary | ICD-10-CM | POA: Diagnosis not present

## 2020-09-21 MED ORDER — FLUTICASONE PROPIONATE 50 MCG/ACT NA SUSP: 1.0000 | Freq: Every day | NASAL | 0 refills | Status: DC

## 2020-09-21 MED ORDER — BENZONATATE 200 MG PO CAPS: 200.0000 mg | ORAL_CAPSULE | Freq: Three times a day (TID) | ORAL | 0 refills | Status: AC | PRN

## 2020-09-21 MED ORDER — PROMETHAZINE-DM 6.25-15 MG/5ML PO SYRP: 5.0000 mL | ORAL_SOLUTION | Freq: Four times a day (QID) | ORAL | 0 refills | Status: DC | PRN

## 2020-09-21 NOTE — Discharge Instructions (Signed)
Covid test pending, monitor my chart for results May begin daily allergy pill with Flonase Tessalon for cough Continue albuterol inhaler 1 to 2 puffs every 4-6 hours as needed Rest and fluids Follow-up if not improving or worsening

## 2020-09-21 NOTE — ED Triage Notes (Signed)
Pt presents to Urgent Care with c/o cough, sore throat, and nasal congestion since yesterday. Pt w/ no known COVID exposure; has been vaccinated against COVID, not flu.

## 2020-09-21 NOTE — ED Provider Notes (Signed)
[facility]    CSN: [id number] Arrival date & time: 09/21/20  1127      History   Chief Complaint Chief Complaint  Patient presents with   Cough   Sore Throat    HPI Bernadette Gores is a 79 y.o. female history of DM type II, hypertension, presenting today for evaluation of URI symptoms.  Patient has had cough congestion and sore throat.  Symptoms began yesterday.  Denies known Covid exposure.  Has had some chest tightness. Denies fevers.  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   Bacterial pharyngitis 08/25/2014   Acute bronchitis 07/03/2014   Oral thrush 07/03/2014   Onychomycosis 02/18/2013   URI (upper respiratory infection) 07/11/2012   Cough 07/11/2012   Ganglion cyst 06/25/2011   Foot pain, left 06/25/2011   Otitis media 06/25/2011   LIPOMA 11/03/2010   EUSTACHIAN TUBE DYSFUNCTION, RIGHT 02/18/2010   Asthma with acute exacerbation 11/02/2009   ONYCHOMYCOSIS, TOENAILS 05/18/2009   Dyslipidemia 09/15/2008   DM2 (diabetes mellitus, type 2) ([place]) 04/19/2007   Essential hypertension 04/19/2007   Allergic rhinitis 04/19/2007   OSTEOARTHRITIS 04/19/2007    Past Surgical History:  Procedure Laterality Date   ACNE CYST REMOVAL  1970   back   Day   complete    OB History   No obstetric history on file.      Home Medications    Prior to Admission medications   Medication Sig Start Date End Date Taking? Authorizing Provider  benzonatate  (TESSALON) 200 MG capsule Take 1 capsule (200 mg total) by mouth 3 (three) times daily as needed for up to 7 days for cough. 09/21/20 09/28/20 Yes Jaqueline Uber C, PA-C  fluticasone (FLONASE) 50 MCG/ACT nasal spray Place 1-2 sprays into both nostrils daily. 09/21/20  Yes Viridiana Spaid C, PA-C  albuterol (VENTOLIN HFA) 108 (90 Base) MCG/ACT inhaler INHALE 1-2 PUFFS INTO THE LUNGS EVERY 6 (SIX) HOURS AS NEEDED FOR WHEEZING OR SHORTNESS OF BREATH. 06/23/19   Plotnikov, Evie Lacks, MD  Blood Glucose Monitoring Suppl (ONETOUCH VERIO REFLECT) w/Device KIT 1 each by Does not apply route daily. E11.9    [provider]  budesonide-formoterol (SYMBICORT) 160-4.5 MCG/ACT inhaler Inhale 2 puffs into the lungs 2 (two) times daily. 08/06/18 08/06/19  Plotnikov, Evie Lacks, MD  Glucose Blood (ONETOUCH VERIO VI) 1 each by Other route daily. E11.9    [provider]  ibuprofen (ADVIL) 600 MG tablet TAKE 1 TABLET BY MOUTH 2 TIMES DAILY AS NEEDED 04/28/20   Plotnikov, Evie Lacks, MD  loratadine (CLARITIN) 10 MG tablet Take 1 tablet (10 mg total) by mouth daily. 01/21/20   Plotnikov, Evie Lacks, MD  losartan-hydrochlorothiazide (HYZAAR) 100-25 MG tablet TAKE 1 TABLET BY MOUTH EVERY DAY 07/04/20   Plotnikov, Evie Lacks, MD  Multiple Vitamins-Minerals (HAIR SKIN AND NAILS FORMULA) TABS Take 1 tablet by mouth daily.    [provider]  nystatin (MYCOSTATIN/NYSTOP) powder Apply topically  4 (four) times daily. Patient not taking: Reported on 08/15/2020 03/17/19   Binnie Rail, MD  nystatin cream (MYCOSTATIN) Apply 1 application topically 2 (two) times daily. Patient not taking: Reported on 08/15/2020 03/17/19   Binnie Rail, MD  OneTouch Delica Lancets 76B MISC 1 each by Does not apply route daily. E11.9    [provider]  repaglinide (PRANDIN) 1 MG tablet TAKE 1 TABLET (1 MG TOTAL) BY MOUTH 3 (THREE) TIMES DAILY BEFORE MEALS. 04/14/20   Plotnikov, Evie Lacks, MD    Family History Family  History  Problem Relation Age of Onset   Cancer Father        brain   Diabetes Mother    Heart attack Mother    Diabetes Sister    Diabetes Brother    Hypertension Sister    Hypertension Brother    Aneurysm Other        niece   Coronary artery disease Brother     Social History Social History   Tobacco Use   Smoking status: Never Smoker   Smokeless tobacco: Never Used  Substance Use Topics   Alcohol use: Yes    Alcohol/week: 0.0 standard drinks    Comment: wine occasionally.   Drug use: No     Allergies   Aspirin, Codeine sulfate, Glimepiride, Metformin, and Propoxyphene n-acetaminophen   Review of Systems Review of Systems  Constitutional: Negative for activity change, appetite change, chills, fatigue and fever.  HENT: Positive for congestion, rhinorrhea and sinus pressure. Negative for ear pain, sore throat and trouble swallowing.   Eyes: Negative for discharge and redness.  Respiratory: Positive for cough and chest tightness. Negative for shortness of breath.   Cardiovascular: Negative for chest pain.  Gastrointestinal: Negative for abdominal pain, diarrhea, nausea and vomiting.  Musculoskeletal: Negative for myalgias.  Skin: Negative for rash.  Neurological: Negative for dizziness, light-headedness and headaches.     Physical Exam Triage Vital Signs ED Triage Vitals  Enc Vitals Group     BP 09/21/20 1513 (!) 193/96     Pulse Rate 09/21/20 1506 94     Resp 09/21/20 1506 20     Temp 09/21/20 1506 98.3 F (36.8 C)     Temp Source 09/21/20 1506 Oral     SpO2 09/21/20 1506 94 %     Weight 09/21/20 1510 203 lb (92.1 kg)     Height 09/21/20 1510 $RemoveBefor'5\' 3"'OfUpifZCRYXl$  (1.6 m)     Head Circumference --      Peak Flow --      Pain Score 09/21/20 1509 8     Pain Loc --      Pain Edu? --      Excl. in [place]? --    No data found.  Updated Vital Signs BP (!) 192/86 (BP Location: Right Arm)    Pulse 94    Temp 98.3 F (36.8 C) (Oral)    Resp 20    Ht $R'5\' 3"'PY$  (1.6  m)    Wt 203 lb (92.1 kg)    SpO2 94%    BMI 35.96 kg/m   Visual Acuity Right Eye Distance:   Left Eye Distance:   Bilateral Distance:    Right Eye Near:   Left Eye Near:    Bilateral Near:     Physical Exam Vitals and nursing note reviewed.  Constitutional:      Appearance: She is well-developed and well-nourished.     Comments: No acute distress  HENT:  Head: Normocephalic and atraumatic.     Ears:     Comments: Bilateral ears without tenderness to palpation of external auricle, tragus and mastoid, EAC's without erythema or swelling, TM's with good bony landmarks and cone of light. Non erythematous.     Nose: Nose normal.     Mouth/Throat:     Comments: Oral mucosa pink and moist, no tonsillar enlargement or exudate. Posterior pharynx patent and nonerythematous, no uvula deviation or swelling. Normal phonation. Eyes:     Conjunctiva/sclera: Conjunctivae normal.  Cardiovascular:     Rate and Rhythm: Normal rate.  Pulmonary:     Effort: Pulmonary effort is normal. No respiratory distress.     Comments: Breathing comfortably at rest, CTABL, no wheezing, rales or other adventitious sounds auscultated Abdominal:     General: There is no distension.  Musculoskeletal:        General: Normal range of motion.     Cervical back: Neck supple.  Skin:    General: Skin is warm and dry.  Neurological:     Mental Status: She is alert and oriented to person, place, and time.  Psychiatric:        Mood and Affect: Mood and affect normal.      UC Treatments / Results  Labs (all labs ordered are listed, but only abnormal results are displayed) Labs Reviewed  NOVEL CORONAVIRUS, NAA    EKG   Radiology No results found.  Procedures Procedures (including critical care time)  Medications Ordered in UC Medications - No data to display  Initial Impression / Assessment and Plan / UC Course  I have reviewed the triage vital signs and the nursing notes.  Pertinent labs &  imaging results that were available during my care of the patient were reviewed by me and considered in my medical decision making (see chart for details).     2 days of URI symptoms with cough, exam reassuring, lungs clear to auscultation, Covid test pending, recommending symptomatic and supportive care and close monitoring of symptoms and breathing.  Discussed strict return precautions. Patient verbalized understanding and is agreeable with plan.  Final Clinical Impressions(s) / UC Diagnoses   Final diagnoses:  Viral URI with cough     Discharge Instructions     Covid test pending, monitor my chart for results May begin daily allergy pill with Flonase Tessalon for cough Continue albuterol inhaler 1 to 2 puffs every 4-6 hours as needed Rest and fluids Follow-up if not improving or worsening    ED Prescriptions    Medication Sig Dispense Auth. Provider   benzonatate (TESSALON) 200 MG capsule Take 1 capsule (200 mg total) by mouth 3 (three) times daily as needed for up to 7 days for cough. 28 capsule Shareece Bultman C, PA-C   fluticasone (FLONASE) 50 MCG/ACT nasal spray Place 1-2 sprays into both nostrils daily. 16 g Abeer Deskins, [place] C, PA-C     PDMP not reviewed this encounter.   Janith Lima, PA-C 09/21/20 1607

## 2020-09-21 NOTE — Telephone Encounter (Signed)
Patient called requesting cough syrup as alternative to [employer], sending and Phenergan DM for cough at home/bedtime.  Advised of associated drowsiness and to limit to use to at home and bedtime.

## 2020-09-22 LAB — NOVEL CORONAVIRUS, NAA: SARS-CoV-2, NAA: DETECTED — AB

## 2020-09-22 LAB — SARS-COV-2, NAA 2 DAY TAT

## 2020-09-30 ENCOUNTER — Other Ambulatory Visit: Payer: Self-pay | Admitting: Internal Medicine

## 2020-10-05 ENCOUNTER — Other Ambulatory Visit: Payer: Self-pay

## 2020-10-05 NOTE — Patient Outreach (Signed)
Aging Gracefully Program  10/05/2020  Maedell Hedger Heidel [birth year] [id number]  Care Coordination:  Incoming call from patient who reports she has COVID.  Reports being diagnosed with COVID on 09/28/2020.    Home visit cancelled.  Reviewed with leadership and patient can be rescheduled after 14 days for home visit. Patient also needs to be symptom free.  Tomasa Rand, RN, BSN, CEN [facility] [employer] [phone]

## 2020-10-20 ENCOUNTER — Other Ambulatory Visit: Payer: Self-pay

## 2020-10-20 ENCOUNTER — Ambulatory Visit: Admission: EM | Admit: 2020-10-20 | Discharge: 2020-10-20 | Discharge status: Against Medical Advice | Payer: PPO

## 2020-10-21 DIAGNOSIS — Z03818 Encounter for observation for suspected exposure to other biological agents ruled out: Secondary | ICD-10-CM | POA: Diagnosis not present

## 2020-10-21 DIAGNOSIS — Z20822 Contact with and (suspected) exposure to covid-19: Secondary | ICD-10-CM | POA: Diagnosis not present

## 2020-10-26 ENCOUNTER — Other Ambulatory Visit: Payer: Self-pay

## 2020-10-28 NOTE — Patient Instructions (Signed)
Goals Addressed              This Visit's Progress   .  Exercise 150 minutes per week (moderate activity)   Not on track     Will try to go to community center and walk around trail  Will try to walk and build up to 30 minutes a day; especially;     .  Patient Stated (pt-stated)   On track     Aging Gracefully: Patient reports she want to improve her strength in the next 90 days.   Interventions: Reviewed with patient the importance of being active. Reviewed with patient that I will provide an exercise plan and demonstrate home exercises at the next home visit.   Tomasa Rand, RN, BSN, CEN [place] Coordinator [phone]   09/08/2020  Home visit completed.  No changes in strength.  Interventions:  Provided printed exercise plan. Review and demonstrated home exercise. Encouraged daily compliance to complete exercises.  Patient voiced understanding.    10/26/20  Assessment:  Patient reports feeling stronger. Continues to do home exercises as suggested by this case Freight forwarder.  Interventions:  Reviewed with patient the importance of continuing to do home exercise.  Encouraged patient to walk as much as she can. Reviewed concern for appropriate place to walk.  Patient is afraid to walk in her neighborhood  due to dangerous dogs.  Tomasa Rand, RN, BSN, CEN [place] Coordinator [phone]   Tomasa Rand, RN, BSN, CEN [facility] [employer] [phone]

## 2020-11-02 ENCOUNTER — Telehealth: Payer: PPO | Admitting: Internal Medicine

## 2020-11-02 ENCOUNTER — Other Ambulatory Visit: Payer: Self-pay

## 2020-11-03 ENCOUNTER — Telehealth (INDEPENDENT_AMBULATORY_CARE_PROVIDER_SITE_OTHER): Payer: PPO | Admitting: Physician Assistant

## 2020-11-03 ENCOUNTER — Encounter: Payer: Self-pay | Admitting: Physician Assistant

## 2020-11-03 ENCOUNTER — Other Ambulatory Visit: Payer: Self-pay | Admitting: Internal Medicine

## 2020-11-03 DIAGNOSIS — I10 Essential (primary) hypertension: Secondary | ICD-10-CM

## 2020-11-03 DIAGNOSIS — E1140 Type 2 diabetes mellitus with diabetic neuropathy, unspecified: Secondary | ICD-10-CM

## 2020-11-03 NOTE — Progress Notes (Signed)
Patient verified DOB Patient has not eaten today and has not taken medication today. Patient denies pain at this time. Patient will report CBG and BP to provider during the visit.

## 2020-11-03 NOTE — Progress Notes (Signed)
New Patient Office Visit  Subjective:  Patient ID: Rachel [name], female    DOB: 06/30/1941  Age: 80 y.o. MRN: [id number]  CC:  Chief Complaint  Patient presents with  . New Patient (Initial Visit)   Virtual Visit via Telephone Note  I connected with Rachel [name] on 11/03/20 at  9:30 AM EST by telephone and verified that I am speaking with the correct person using two identifiers.  Location: Patient: Home Provider: Primary Care at [facility]   I discussed the limitations, risks, security and privacy concerns of performing an evaluation and management service by telephone and the availability of in person appointments. I also discussed with the patient that there may be a patient responsible charge related to this service. The patient expressed understanding and agreed to proceed.   History of Present Illness: Patient reports that she would like to transfer her care from about her health care.  Reports that she lives closer to this clinic.  States that she does not like to drive very far and thinks this will be much more convenient for her.   Reports that she has been seeing endocrinology for diabetes management.  Reports that during her last office visit, they encouraged her to use an injectable, states that she did not want to start that without being seen and the appointment was a virtual appointment.  Reports that she is compliant to her medication, states that she does check her blood glucose at home, states this mornings fasting blood glucose was 219.  Reports that she does check her blood pressure at home, states that she had a reading this morning of 164/79, states this was prior to taking her medication.  Reports that her blood pressure readings are generally 145/80.  Does endorse that she has a lot of tingling in her feet that is bothersome for her while she is trying to sleep, states this has been ongoing for "a long  time"   Observations/Objective: Medical history and current medications reviewed, no physical exam completed    Past Medical History:  Diagnosis Date  . Allergic rhinitis   . Diabetes mellitus, type 2 ([place])   . Hyperlipemia   . Hypertension   . Lipoma NEC    anterior upper chest  . Osteoarthritis of knee     Past Surgical History:  Procedure Laterality Date  . ACNE CYST REMOVAL  1970   back  . APPENDECTOMY  1963  . OOPHORECTOMY  1985  . VAGINAL HYSTERECTOMY  1985   complete    Family History  Problem Relation Age of Onset  . Cancer Father        brain  . Diabetes Mother   . Heart attack Mother   . Diabetes Sister   . Diabetes Brother   . Hypertension Sister   . Hypertension Brother   . Aneurysm Other        niece  . Coronary artery disease Brother     Social History   Socioeconomic History  . Marital status: Widowed    Spouse name: Not on file  . Number of children: Not on file  . Years of education: Not on file  . Highest education level: Not on file  Occupational History  . Occupation: department mang    Employer: [employer]: Retired  Tobacco Use  . Smoking status: Never Smoker  . Smokeless tobacco: Never Used  Substance and Sexual Activity  . Alcohol use: Yes  Alcohol/week: 0.0 standard drinks    Comment: wine occasionally.  . Drug use: No  . Sexual activity: Not Currently  Other Topics Concern  . Not on file  Social History Narrative   No regular exercise   Widowed   Social Determinants of Health   Financial Resource Strain: Not on file  Food Insecurity: Not on file  Transportation Needs: Not on file  Physical Activity: Not on file  Stress: Not on file  Social Connections: Not on file  Intimate Partner Violence: Not on file    ROS Review of Systems  Constitutional: Negative.   HENT: Negative.   Eyes: Negative.   Respiratory: Negative.   Cardiovascular: Negative.   Gastrointestinal: Negative.   Endocrine:  Negative.   Genitourinary: Negative.   Musculoskeletal: Negative.   Skin: Negative.   Allergic/Immunologic: Negative.   Neurological: Negative.   Hematological: Negative.   Psychiatric/Behavioral: Negative.     Objective:   Today's Vitals: There were no vitals taken for this visit.    Assessment & Plan:   Problem List Items Addressed This Visit      Cardiovascular and Mediastinum   Essential hypertension     Endocrine   DM2 (diabetes mellitus, type 2) ([place]) - Primary      Outpatient Encounter Medications as of 11/03/2020  Medication Sig  . albuterol (VENTOLIN HFA) 108 (90 Base) MCG/ACT inhaler INHALE 1-2 PUFFS INTO THE LUNGS EVERY 6 (SIX) HOURS AS NEEDED FOR WHEEZING OR SHORTNESS OF BREATH.  [name] Blood Glucose Monitoring Suppl (ONETOUCH VERIO REFLECT) w/Device KIT 1 each by Does not apply route daily. E11.9  . budesonide-formoterol (SYMBICORT) 160-4.5 MCG/ACT inhaler Inhale 2 puffs into the lungs 2 (two) times daily.  . fluticasone (FLONASE) 50 MCG/ACT nasal spray Place 1-2 sprays into both nostrils daily.  . Glucose Blood (ONETOUCH VERIO VI) 1 each by Other route daily. E11.9  . glucose blood (ONETOUCH VERIO) test strip Use to check blood sugars once a day  . ibuprofen (ADVIL) 600 MG tablet TAKE 1 TABLET BY MOUTH 2 TIMES DAILY AS NEEDED  . loratadine (CLARITIN) 10 MG tablet Take 1 tablet (10 mg total) by mouth daily.  . Multiple Vitamins-Minerals (HAIR SKIN AND NAILS FORMULA) TABS Take 1 tablet by mouth daily.  [name] nystatin (MYCOSTATIN/NYSTOP) powder Apply topically 4 (four) times daily.  [name] nystatin cream (MYCOSTATIN) Apply 1 application topically 2 (two) times daily.  Rachel [name] Delica Lancets 63K MISC 1 each by Does not apply route daily. E11.9  . promethazine-dextromethorphan (PROMETHAZINE-DM) 6.25-15 MG/5ML syrup Take 5 mLs by mouth 4 (four) times daily as needed for cough.  . [DISCONTINUED] losartan-hydrochlorothiazide (HYZAAR) 100-25 MG tablet TAKE 1 TABLET BY MOUTH EVERY  DAY  . repaglinide (PRANDIN) 1 MG tablet TAKE 1 TABLET (1 MG TOTAL) BY MOUTH 3 (THREE) TIMES DAILY BEFORE MEALS.   No facility-administered encounter medications on file as of 11/03/2020.    Assessment and Plan: 1. Type 2 diabetes mellitus with diabetic neuropathy, without long-term current use of insulin ([place]) Patient to have in person appointment with this provider on Monday 11/07/20 at 8:30 AM for further review.   No medication refills needed today  2. Essential hypertension    Follow Up Instructions:    I discussed the assessment and treatment plan with the patient. The patient was provided an opportunity to ask questions and all were answered. The patient agreed with the plan and demonstrated an understanding of the instructions.   The patient was advised to call back or seek  an in-person evaluation if the symptoms worsen or if the condition fails to improve as anticipated.  I provided 21 minutes of non-face-to-face time during this encounter.   Nguyen Butler S Oprah Camarena, PA-C   Follow-up: Return in about 4 days (around 11/07/2020).   Loraine Grip Aneliz Carbary, PA-C

## 2020-11-06 NOTE — Patient Instructions (Signed)
We will see you Monday, February 14 at 8:30 AM.  Please make sure to bring your medications and your blood pressure and blood sugar readings with you.  Look forward to seeing you  Kennieth Rad, PA-C Physician Assistant [place] [email or web address]

## 2020-11-07 ENCOUNTER — Ambulatory Visit: Payer: PPO | Admitting: Physician Assistant

## 2020-11-18 ENCOUNTER — Other Ambulatory Visit: Payer: Self-pay

## 2020-11-18 NOTE — Patient Outreach (Signed)
Aging Gracefully Program  RN Visit  11/18/2020  Rachel [name] [facility] 11/08/1940 [id number]  Visit:   RN Home #4  Start Time:   1100 End Time:   1200 Total Minutes:   60  Readiness To Change Score:     Universal RN Interventions: Calendar Distribution: Yes Exercise Review: Yes Medications: Yes Medication Changes: Yes Mood: Yes Pain: Yes PCP Advocacy/Support: Yes Fall Prevention: Yes Incontinence: Yes Clinician View Of Client Situation: Ambulatory to the door without difficulty. Wearing mask. Denies Covid s/s. appears happy. Is currently cleaning out for rennovations. Patient is doing very well. Client View Of His/Her Situation: Patient reports that she is doing well. Denies any new problems or concerns. Denies any recent falls.  Reports she is now walking up 3 houses one way and 3 house the other way.  Reports she carries a Aeronautical engineer.  Also states her eyes are better. Patient reports that she has follow up with primary MD next week. Reviewed fasting CBG levels of 200.  Encouraged patient to talk to MD about this at office visit.  Healthcare Provider Communication:none    Clinician View of Client Situation: Clinician View Of Client Situation: Ambulatory to the door without difficulty. Wearing mask. Denies Covid s/s. appears happy. Is currently cleaning out for rennovations. Patient is doing very well. Client's View of His/Her Situation: Client View Of His/Her Situation: Patient reports that she is doing well. Denies any new problems or concerns. Denies any recent falls.  Reports she is now walking up 3 houses one way and 3 house the other way.  Reports she carries a Aeronautical engineer.  Also states her eyes are better. Patient reports that she has follow up with primary MD next week. Reviewed fasting CBG levels of 200.  Encouraged patient to talk to MD about this at office visit.  Medication Assessment:no changes in medications    OT Update: RN visits completed. Provided patient  update about modifications.   Session Summary: Patient doing well. Is more active. Recently cleaning out to prepare for modifications.  Goals Addressed              This Visit's Progress   .  COMPLETED: Patient Stated (pt-stated)        Aging Gracefully: Patient reports she want to improve her strength in the next 90 days.   Interventions: Reviewed with patient the importance of being active. Reviewed with patient that I will provide an exercise plan and demonstrate home exercises at the next home visit.   Tomasa Rand, RN, BSN, CEN [place] Coordinator [phone]   09/08/2020  Home visit completed.  No changes in strength.  Interventions:  Provided printed exercise plan. Review and demonstrated home exercise. Encouraged daily compliance to complete exercises.  Patient voiced understanding.    10/26/20  Assessment:  Patient reports feeling stronger. Continues to do home exercises as suggested by this case Freight forwarder.  Interventions:  Reviewed with patient the importance of continuing to do home exercise.  Encouraged patient to walk as much as she can. Reviewed concern for appropriate place to walk.  Patient is afraid to walk in her neighborhood  due to dangerous dogs.  Tomasa Rand, RN, BSN, CEN [place] Coordinator [phone]   11/18/2020 Assessment: Patient is walking in neighborhood and continues to do home exercises provided by this case manager.  Interventions: Reviewed importance of continuing to exercise.  Tomasa Rand, RN, BSN, CEN [facility] [employer] [phone]       Nursing visits  completed.   Tomasa Rand, RN, BSN, CEN [facility] [employer] [phone]

## 2020-11-18 NOTE — Patient Instructions (Signed)
Goals Addressed              This Visit's Progress   .  COMPLETED: Patient Stated (pt-stated)        Aging Gracefully: Patient reports she want to improve her strength in the next 90 days.   Interventions: Reviewed with patient the importance of being active. Reviewed with patient that I will provide an exercise plan and demonstrate home exercises at the next home visit.   Tomasa Rand, RN, BSN, CEN [place] Coordinator [phone]   09/08/2020  Home visit completed.  No changes in strength.  Interventions:  Provided printed exercise plan. Review and demonstrated home exercise. Encouraged daily compliance to complete exercises.  Patient voiced understanding.    10/26/20  Assessment:  Patient reports feeling stronger. Continues to do home exercises as suggested by this case Freight forwarder.  Interventions:  Reviewed with patient the importance of continuing to do home exercise.  Encouraged patient to walk as much as she can. Reviewed concern for appropriate place to walk.  Patient is afraid to walk in her neighborhood  due to dangerous dogs.  Tomasa Rand, RN, BSN, CEN [place] Coordinator [phone]   11/18/2020 Assessment: Patient is walking in neighborhood and continues to do home exercises provided by this case manager.  Interventions: Reviewed importance of continuing to exercise.  Tomasa Rand, RN, BSN, CEN [facility] [employer] [phone]

## 2020-11-22 ENCOUNTER — Ambulatory Visit (INDEPENDENT_AMBULATORY_CARE_PROVIDER_SITE_OTHER): Payer: PPO | Admitting: Family

## 2020-11-22 ENCOUNTER — Encounter: Payer: Self-pay | Admitting: Family

## 2020-11-22 ENCOUNTER — Other Ambulatory Visit: Payer: Self-pay

## 2020-11-22 VITALS — BP 178/84 | HR 62 | Ht 63.39 in | Wt 199.6 lb

## 2020-11-22 DIAGNOSIS — I10 Essential (primary) hypertension: Secondary | ICD-10-CM | POA: Diagnosis not present

## 2020-11-22 DIAGNOSIS — E1140 Type 2 diabetes mellitus with diabetic neuropathy, unspecified: Secondary | ICD-10-CM | POA: Diagnosis not present

## 2020-11-22 LAB — POCT GLYCOSYLATED HEMOGLOBIN (HGB A1C): Hemoglobin A1C: 10.1 % — AB (ref 4.0–5.6)

## 2020-11-22 MED ORDER — AMLODIPINE BESYLATE 5 MG PO TABS: 5.0000 mg | ORAL_TABLET | Freq: Every day | ORAL | 0 refills | Status: DC

## 2020-11-22 MED ORDER — REPAGLINIDE 2 MG PO TABS: 2.0000 mg | ORAL_TABLET | Freq: Three times a day (TID) | ORAL | 2 refills | Status: DC

## 2020-11-22 NOTE — Progress Notes (Signed)
Patient ID: Rachel [name], female    DOB: 08-06-1941  MRN: [id number]  CC: Hypertension and Diabetes Follow-Up  Subjective: Rachel [name] is a 80 y.o. female who presents for who presents for diabetes follow-up. PMH significant for mild cardiomegaly, essential hypertension, asthmatic bronchitis, allergic rhinitis, upper respiratory infection, acute bronchitis, bacterial pharyngitis, diabetes type 2, onychomycosis toenails, osteoarthritis, TMJ arthritis, lipoma, ganglion cyst, and dyslipidemia.   1. HYPERTENSION FOLLOW-UP: Last visit 11/03/2020 with physician assistant Cari Mayers.   11/22/2020: Currently taking: see medication list Have you taken your blood pressure medication today: _0  Yes _1  No  Med Adherence: _2  Yes    _3  No Medication side effects: _4  Yes    _5  No  Adherence with salt restriction (low-salt diet): _6  Yes    _7  No Exercise: trying Home Monitoring?: _8  Yes    _9  No Monitoring Frequency: _10  Yes    _11  No Home BP results range: _12  Yes, 130's-150's/70's-80's Smoking _13  Yes _14  No SOB? _15  Yes    _16  No Chest Pain?: _17  Yes    _18  No Leg swelling?: _19  Yes, sometimes at the end of the day and tingling  Headaches?: _20  Yes    _21  No Dizziness? _22  Yes    _23  No  2. DIABETES TYPE 2 FOLLOW-UP:  Last visit 11/03/2020 with physician assistant Cari Mayers.   11/22/2020: Last A1C: 10.1% on 11/22/2020 Have you taken your anti-diabetic medications today: _24  Yes _25  No  Med Adherence:  _26  Yes    _27  No Medication side effects:  _28  Yes    _29  No Diet Adherence: _30  Yes    _31  No, grits, juice  Exercise: _32  Yes    _33  No Numbness of the feet? _34  Yes    _35  No    Patient Active Problem List   Diagnosis Date Noted  . Candidiasis of skin 03/17/2019  . 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 Mild cardiomegaly 01/03/2018  . Well adult exam 12/14/2015  . Acute UTI 12/14/2015  . 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 Bacterial pharyngitis 08/25/2014  . Acute  bronchitis 07/03/2014  . Oral thrush 07/03/2014  . Onychomycosis 02/18/2013  . URI (upper respiratory infection) 07/11/2012  . Cough 07/11/2012  . Ganglion cyst 06/25/2011  . Foot pain, left 06/25/2011  . Otitis media 06/25/2011  . LIPOMA 11/03/2010  . EUSTACHIAN TUBE DYSFUNCTION, RIGHT 02/18/2010  . Asthma with acute exacerbation 11/02/2009  . ONYCHOMYCOSIS, TOENAILS 05/18/2009  . Dyslipidemia 09/15/2008  . DM2 (diabetes mellitus, type 2) ([place]) 04/19/2007  . Essential hypertension 04/19/2007  . Allergic rhinitis 04/19/2007  . OSTEOARTHRITIS 04/19/2007     Current Outpatient Medications on File Prior to Visit  Medication Sig Dispense Refill  . albuterol (VENTOLIN HFA) 108 (90 Base) MCG/ACT inhaler INHALE 1-2 PUFFS INTO THE LUNGS EVERY 6 (SIX) HOURS AS NEEDED FOR WHEEZING OR SHORTNESS OF BREATH. 18 g 1  . Blood Glucose Monitoring Suppl (ONETOUCH VERIO REFLECT) w/Device KIT 1 each by Does not apply route daily. E11.9    . fluticasone (FLONASE) 50 MCG/ACT nasal spray Place 1-2 sprays into both nostrils daily. 16 g 0  . Glucose Blood (ONETOUCH VERIO VI) 1 each by Other route daily. E11.9    . glucose blood (ONETOUCH VERIO) test strip Use to check blood sugars once a day 100 strip 5  . ibuprofen (ADVIL) 600 MG tablet TAKE 1 TABLET BY MOUTH 2 TIMES DAILY AS NEEDED 60 tablet 1  . loratadine (CLARITIN) 10 MG tablet Take 1 tablet (10 mg total)  by mouth daily. 100 tablet 3  . losartan-hydrochlorothiazide (HYZAAR) 100-25 MG tablet Take 1 tablet by mouth daily. Annual appt due in April must see provider for future refills (Patient taking differently: Take 1 tablet by mouth daily.) 90 tablet 0  . Multiple Vitamins-Minerals (HAIR SKIN AND NAILS FORMULA) TABS Take 1 tablet by mouth daily.    [name] nystatin (MYCOSTATIN/NYSTOP) powder Apply topically 4 (four) times daily. 60 g 0  . nystatin cream (MYCOSTATIN) Apply 1 application topically 2 (two) times daily. 30 g 1  . OneTouch Delica Lancets 26Z MISC 1  each by Does not apply route daily. E11.9     No current facility-administered medications on file prior to visit.    Allergies  Allergen Reactions  . Aspirin Nausea And Vomiting    Uncoated aspirin Jittery GI upset  . Codeine Sulfate Nausea And Vomiting    Heart races.  . Glimepiride Diarrhea  . Metformin Diarrhea  . Propoxyphene N-Acetaminophen Nausea And Vomiting    Social History   Socioeconomic History  . Marital status: Widowed    Spouse name: Not on file  . Number of children: Not on file  . Years of education: Not on file  . Highest education level: Not on file  Occupational History  . Occupation: department mang    Employer: [employer]: Retired  Tobacco Use  . Smoking status: Never Smoker  . Smokeless tobacco: Never Used  Vaping Use  . Vaping Use: Never used  Substance and Sexual Activity  . Alcohol use: Yes    Alcohol/week: 1.0 standard drink    Types: 1 Glasses of wine per week    Comment: ocassionally  . Drug use: No  . Sexual activity: Not Currently  Other Topics Concern  . Not on file  Social History Narrative   No regular exercise   Widowed   Social Determinants of Health   Financial Resource Strain: Not on file  Food Insecurity: Not on file  Transportation Needs: Not on file  Physical Activity: Not on file  Stress: Not on file  Social Connections: Not on file  Intimate Partner Violence: Not on file    Family History  Problem Relation Age of Onset  . Cancer Father        brain  . Diabetes Mother   . Heart attack Mother   . Diabetes Sister   . Diabetes Brother   . Hypertension Sister   . Hypertension Brother   . Aneurysm Other        niece  . Coronary artery disease Brother     Past Surgical History:  Procedure Laterality Date  . ACNE CYST REMOVAL  1970   back  . APPENDECTOMY  1963  . OOPHORECTOMY  1985  . VAGINAL HYSTERECTOMY  1985   complete    ROS: Review of Systems Negative except as stated  above  PHYSICAL EXAM: BP (!) 178/84 (BP Location: Left Arm, Patient Position: Sitting)   Pulse 62   Ht 5' 3.39" (1.61 m)   Wt 199 lb 9.6 oz (90.5 kg)   SpO2 97%   BMI 34.93 kg/m   Physical Exam Constitutional:      Appearance: She is obese.  HENT:     Head: Normocephalic and atraumatic.  Eyes:     Extraocular Movements: Extraocular movements intact.     Conjunctiva/sclera: Conjunctivae normal.     Pupils: Pupils are equal, round, and reactive to light.  Cardiovascular:  Rate and Rhythm: Normal rate and regular rhythm.     Pulses: Normal pulses.     Heart sounds: Normal heart sounds.  Pulmonary:     Effort: Pulmonary effort is normal.     Breath sounds: Normal breath sounds.  Musculoskeletal:     Cervical back: Normal range of motion and neck supple.  Neurological:     General: No focal deficit present.     Mental Status: She is alert and oriented to person, place, and time.  Psychiatric:        Mood and Affect: Mood normal.        Behavior: Behavior normal.    Diabetic foot exam was performed with the following findings:   No deformities, ulcerations, or other skin breakdown Intact posterior tibialis and dorsalis pedis pulses Bilateral decreased sensation with monofilament testing.    Results for orders placed or performed in visit on 11/22/20  POCT glycosylated hemoglobin (Hb A1C)  Result Value Ref Range   Hemoglobin A1C 10.1 (A) 4.0 - 5.6 %   HbA1c POC (<> result, manual entry)     HbA1c, POC (prediabetic range)     HbA1c, POC (controlled diabetic range)      ASSESSMENT AND PLAN: 1. Essential hypertension: - Blood pressure not at goal during today's visit. Patient asymptomatic without chest pressure, chest pain, palpitations, shortness of breath, and worst headache of life.  - Patient has not taken blood pressure medication today as of yet.  - Home blood pressure readings not at goal. - Continue Losartan-Hydrochlorothiazide 100-25 mg daily. Refills  available on file.  - Begin low dose Amlodipine as prescribed.  - Follow-up with primary provider within 2 weeks for blood pressure check. Write down your blood pressure readings each day and bring those results along with your home blood pressure monitor to your appointment.  - Counseled on blood pressure goal of less than 130/80, low-sodium, DASH diet, medication compliance, 150 minutes of moderate intensity exercise per week as tolerated. Discussed medication compliance, adverse effects. - amLODipine (NORVASC) 5 MG tablet; Take 1 tablet (5 mg total) by mouth daily.  Dispense: 30 tablet; Refill: 0  2. Type 2 diabetes mellitus with diabetic neuropathy, without long-term current use of insulin ([place]): - Hemoglobin A1c today not at goal at 10.1%, goal < 7%. This is close to previous hemoglobin A1c of 10.6% on 03/31/2020. - Declines beginning insulin.  - Intolerant of Glimepiride and Metformin.  - Increase Repaglinide from 1 mg three times daily to 2 mg three times daily. - Discussed the importance of healthy eating habits, low-carbohydrate diet, low-sugar diet, regular aerobic exercise (at least 150 minutes a week as tolerated) and medication compliance to achieve or maintain control of diabetes. - Follow-up with primary provider within 4 weeks or sooner if needed.   - POCT glycosylated hemoglobin (Hb A1C) - repaglinide (PRANDIN) 2 MG tablet; Take 1 tablet (2 mg total) by mouth 3 (three) times daily before meals.  Dispense: 90 tablet; Refill: 2   Patient was given the opportunity to ask questions.  Patient verbalized understanding of the plan and was able to repeat key elements of the plan. Patient was given clear instructions to go to Emergency Department or return to medical center if symptoms don't improve, worsen, or new problems develop.The patient verbalized understanding.   Orders Placed This Encounter  Procedures  . POCT glycosylated hemoglobin (Hb A1C)    Requested Prescriptions    Signed Prescriptions Disp Refills  . repaglinide (PRANDIN) 2 MG tablet  90 tablet 2    Sig: Take 1 tablet (2 mg total) by mouth 3 (three) times daily before meals.  [name] amLODipine (NORVASC) 5 MG tablet 30 tablet 0    Sig: Take 1 tablet (5 mg total) by mouth daily.    Return in about 4 weeks (around 12/20/2020) for Physical, Follow-Up.  Camillia Herter, NP

## 2020-11-22 NOTE — Progress Notes (Signed)
Establish care Poor appetite Skin tag on right breast and left thigh irritating Has not taken BP meds

## 2020-11-22 NOTE — Patient Instructions (Addendum)
Increase Prandin for diabetes.  Continue Losartan-Hydrochlorothiazide for high blood pressure.   Adding Amlodipine for high blood pressure.   Return in 2 to 4 weeks for physical exam and checkup.  Hypertension, Adult Hypertension is another name for high blood pressure. High blood pressure forces your heart to work harder to pump blood. This can cause problems over time. There are two numbers in a blood pressure reading. There is a top number (systolic) over a bottom number (diastolic). It is best to have a blood pressure that is below 120/80. Healthy choices can help lower your blood pressure, or you may need medicine to help lower it. What are the causes? The cause of this condition is not known. Some conditions may be related to high blood pressure. What increases the risk?  Smoking.  Having type 2 diabetes mellitus, high cholesterol, or both.  Not getting enough exercise or physical activity.  Being overweight.  Having too much fat, sugar, calories, or salt (sodium) in your diet.  Drinking too much alcohol.  Having long-term (chronic) kidney disease.  Having a family history of high blood pressure.  Age. Risk increases with age.  Race. You may be at higher risk if you are African American.  Gender. Men are at higher risk than women before age 89. After age 42, women are at higher risk than men.  Having obstructive sleep apnea.  Stress. What are the signs or symptoms?  High blood pressure may not cause symptoms. Very high blood pressure (hypertensive crisis) may cause: ? Headache. ? Feelings of worry or nervousness (anxiety). ? Shortness of breath. ? Nosebleed. ? A feeling of being sick to your stomach (nausea). ? Throwing up (vomiting). ? Changes in how you see. ? Very bad chest pain. ? Seizures. How is this treated?  This condition is treated by making healthy lifestyle changes, such as: ? Eating healthy foods. ? Exercising more. ? Drinking less  alcohol.  Your health care provider may prescribe medicine if lifestyle changes are not enough to get your blood pressure under control, and if: ? Your top number is above 130. ? Your bottom number is above 80.  Your personal target blood pressure may vary. Follow these instructions at home: Eating and drinking  If told, follow the DASH eating plan. To follow this plan: ? Fill one half of your plate at each meal with fruits and vegetables. ? Fill one fourth of your plate at each meal with whole grains. Whole grains include whole-wheat pasta, brown rice, and whole-grain bread. ? Eat or drink low-fat dairy products, such as skim milk or low-fat yogurt. ? Fill one fourth of your plate at each meal with low-fat (lean) proteins. Low-fat proteins include fish, chicken without skin, eggs, beans, and tofu. ? Avoid fatty meat, cured and processed meat, or chicken with skin. ? Avoid pre-made or processed food.  Eat less than 1,500 mg of salt each day.  Do not drink alcohol if: ? Your doctor tells you not to drink. ? You are pregnant, may be pregnant, or are planning to become pregnant.  If you drink alcohol: ? Limit how much you use to:  0-1 drink a day for women.  0-2 drinks a day for men. ? Be aware of how much alcohol is in your drink. In the [place] one drink equals one 12 oz bottle of beer (355 mL), one 5 oz glass of wine (148 mL), or one 1 oz glass of hard liquor (44 mL).   Lifestyle  Work with your doctor to stay at a healthy weight or to lose weight. Ask your doctor what the best weight is for you.  Get at least 30 minutes of exercise most days of the week. This may include walking, swimming, or biking.  Get at least 30 minutes of exercise that strengthens your muscles (resistance exercise) at least 3 days a week. This may include lifting weights or doing Pilates.  Do not use any products that contain nicotine or tobacco, such as cigarettes, e-cigarettes, and chewing tobacco. If  you need help quitting, ask your doctor.  Check your blood pressure at home as told by your doctor.  Keep all follow-up visits as told by your doctor. This is important.   Medicines  Take over-the-counter and prescription medicines only as told by your doctor. Follow directions carefully.  Do not skip doses of blood pressure medicine. The medicine does not work as well if you skip doses. Skipping doses also puts you at risk for problems.  Ask your doctor about side effects or reactions to medicines that you should watch for. Contact a doctor if you:  Think you are having a reaction to the medicine you are taking.  Have headaches that keep coming back (recurring).  Feel dizzy.  Have swelling in your ankles.  Have trouble with your vision. Get help right away if you:  Get a very bad headache.  Start to feel mixed up (confused).  Feel weak or numb.  Feel faint.  Have very bad pain in your: ? Chest. ? Belly (abdomen).  Throw up more than once.  Have trouble breathing. Summary  Hypertension is another name for high blood pressure.  High blood pressure forces your heart to work harder to pump blood.  For most people, a normal blood pressure is less than 120/80.  Making healthy choices can help lower blood pressure. If your blood pressure does not get lower with healthy choices, you may need to take medicine. This information is not intended to replace advice given to you by your health care provider. Make sure you discuss any questions you have with your health care provider. Document Revised: 05/21/2018 Document Reviewed: 05/21/2018 Elsevier Patient Education  2021 [employer].

## 2020-11-24 ENCOUNTER — Other Ambulatory Visit: Payer: Self-pay | Admitting: Internal Medicine

## 2020-11-24 DIAGNOSIS — I10 Essential (primary) hypertension: Secondary | ICD-10-CM

## 2020-11-28 MED ORDER — LOSARTAN POTASSIUM-HCTZ 100-25 MG PO TABS: 1.0000 | ORAL_TABLET | Freq: Every day | ORAL | 3 refills | Status: DC

## 2020-11-28 NOTE — Addendum Note (Signed)
Addended by: Earnstine Regal on: 11/28/2020 08:44 AM   Modules accepted: Orders

## 2020-12-01 ENCOUNTER — Other Ambulatory Visit: Payer: Self-pay | Admitting: Internal Medicine

## 2020-12-03 ENCOUNTER — Other Ambulatory Visit: Payer: Self-pay | Admitting: Internal Medicine

## 2020-12-23 ENCOUNTER — Encounter: Payer: PPO | Admitting: Internal Medicine

## 2020-12-26 ENCOUNTER — Other Ambulatory Visit: Payer: Self-pay | Admitting: Family

## 2020-12-26 DIAGNOSIS — I10 Essential (primary) hypertension: Secondary | ICD-10-CM

## 2020-12-26 NOTE — Telephone Encounter (Signed)
Amlodipine refilled per patient request. Office visit needed for additional refills.

## 2021-01-03 ENCOUNTER — Telehealth: Payer: Self-pay | Admitting: Specialist

## 2021-01-03 NOTE — Telephone Encounter (Signed)
OT called patient to schedule next A[facility] visit.  Patient would like until flooring modifications are complete and will call back to schedule. [name], [place], OTR/L [phone]

## 2021-01-04 ENCOUNTER — Ambulatory Visit (INDEPENDENT_AMBULATORY_CARE_PROVIDER_SITE_OTHER): Payer: PPO | Admitting: Family

## 2021-01-04 ENCOUNTER — Encounter: Payer: Self-pay | Admitting: Family

## 2021-01-04 ENCOUNTER — Other Ambulatory Visit: Payer: Self-pay

## 2021-01-04 ENCOUNTER — Ambulatory Visit (INDEPENDENT_AMBULATORY_CARE_PROVIDER_SITE_OTHER): Payer: PPO

## 2021-01-04 VITALS — BP 167/78 | HR 62 | Ht 63.39 in | Wt 199.0 lb

## 2021-01-04 DIAGNOSIS — Z0100 Encounter for examination of eyes and vision without abnormal findings: Secondary | ICD-10-CM | POA: Diagnosis not present

## 2021-01-04 DIAGNOSIS — E119 Type 2 diabetes mellitus without complications: Secondary | ICD-10-CM

## 2021-01-04 DIAGNOSIS — Z13220 Encounter for screening for lipoid disorders: Secondary | ICD-10-CM

## 2021-01-04 DIAGNOSIS — M542 Cervicalgia: Secondary | ICD-10-CM

## 2021-01-04 DIAGNOSIS — I10 Essential (primary) hypertension: Secondary | ICD-10-CM | POA: Diagnosis not present

## 2021-01-04 DIAGNOSIS — Z13228 Encounter for screening for other metabolic disorders: Secondary | ICD-10-CM | POA: Diagnosis not present

## 2021-01-04 DIAGNOSIS — Z0000 Encounter for general adult medical examination without abnormal findings: Secondary | ICD-10-CM

## 2021-01-04 DIAGNOSIS — Z13820 Encounter for screening for osteoporosis: Secondary | ICD-10-CM | POA: Diagnosis not present

## 2021-01-04 DIAGNOSIS — Z130 Encounter for screening for diseases of the blood and blood-forming organs and certain disorders involving the immune mechanism: Secondary | ICD-10-CM | POA: Diagnosis not present

## 2021-01-04 DIAGNOSIS — Z1329 Encounter for screening for other suspected endocrine disorder: Secondary | ICD-10-CM | POA: Diagnosis not present

## 2021-01-04 NOTE — Progress Notes (Signed)
Patient ID: Rachel [name], female    DOB: July 06, 1941  MRN: [id number]  Annual Wellness Visit  Rachel [name] is a 80 y.o. Female who presents for an Annual Wellness Visit.  HPI: 1. HYPERTENSION FOLLOW-UP: 11/22/2020: - Continue Losartan-Hydrochlorothiazide 100-25 mg daily. Refills available on file.  - Begin low dose Amlodipine as prescribed.  - Follow-up with primary provider within 2 weeks for blood pressure check. Write down your blood pressure readings each day and bring those results along with your home blood pressure monitor to your appointment.   01/04/2021: Currently taking: see medication list Have you taken your blood pressure medication today: []  Yes [x]  No  Med Adherence: []  Yes    [x]  No, recently quit taking Amlodipine because of leg/ankle swelling. When she discontinued the swelling subsided.  Home Monitoring?: []  Yes    []  No Monitoring Frequency: []  Yes    []  No Home BP results range: [x]  Yes, 160's/70's-80's SOB? []  Yes    [x]  No Chest Pain?: []  Yes    [x]  No Leg swelling?: []  Yes    [x]  No Headaches?: []  Yes    [x]  No Dizziness? []  Yes    [x]  No  2. NECK PAIN: Location:  Posterior neck, difficulty turning and unbearable Onset: 1 week had it one time before and it was related to muscle spasms. Currently sleeping on the left side while in bed.   Description: Aggravating factors: bending down  Symptoms Back Pain:  No  Numbness/tingling: No    Weakness: No  Comments: Currently taking Ibuprofen 600 mg. ? Patient Active Problem List   Diagnosis Date Noted  . Candidiasis of skin 03/17/2019  . 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 Mild cardiomegaly 01/03/2018  . Well adult exam 12/14/2015  . Acute UTI 12/14/2015  . 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 Bacterial pharyngitis 08/25/2014  . Acute bronchitis 07/03/2014  . Oral thrush 07/03/2014  . Onychomycosis 02/18/2013  . URI (upper respiratory infection) 07/11/2012  . Cough 07/11/2012  . Ganglion  cyst 06/25/2011  . Foot pain, left 06/25/2011  . Otitis media 06/25/2011  . LIPOMA 11/03/2010  . EUSTACHIAN TUBE DYSFUNCTION, RIGHT 02/18/2010  . Asthma with acute exacerbation 11/02/2009  . ONYCHOMYCOSIS, TOENAILS 05/18/2009  . Dyslipidemia 09/15/2008  . DM2 (diabetes mellitus, type 2) ([place]) 04/19/2007  . Essential hypertension 04/19/2007  . Allergic rhinitis 04/19/2007  . OSTEOARTHRITIS 04/19/2007    Current Outpatient Medications on File Prior to Visit  Medication Sig Dispense Refill  . albuterol (VENTOLIN HFA) 108 (90 Base) MCG/ACT inhaler INHALE 1-2 PUFFS INTO THE LUNGS EVERY 6 (SIX) HOURS AS NEEDED FOR WHEEZING OR SHORTNESS OF BREATH. 18 g 1  . Blood Glucose Monitoring Suppl (ONETOUCH VERIO REFLECT) w/Device KIT 1 each by Does not apply route daily. E11.9    . fluticasone (FLONASE) 50 MCG/ACT nasal spray Place 1-2 sprays into both nostrils daily. 16 g 0  . Glucose Blood (ONETOUCH VERIO VI) 1 each by Other route daily. E11.9    . glucose blood (ONETOUCH VERIO) test strip Use to check blood sugars once a day 100 strip 5  . ibuprofen (ADVIL) 600 MG tablet TAKE 1 TABLET BY MOUTH 2 TIMES DAILY AS NEEDED 60 tablet 1  . loratadine (CLARITIN) 10 MG tablet Take 1 tablet (10 mg total) by mouth daily. 100 tablet 3  . losartan-hydrochlorothiazide (HYZAAR) 100-25 MG tablet Take 1 tablet by mouth daily. 90 tablet 3  . Multiple Vitamins-Minerals (HAIR SKIN AND NAILS FORMULA) TABS Take 1 tablet by mouth  daily.    . nystatin (MYCOSTATIN/NYSTOP) powder Apply topically 4 (four) times daily. 60 g 0  . nystatin cream (MYCOSTATIN) Apply 1 application topically 2 (two) times daily. 30 g 1  . OneTouch Delica Lancets 73S MISC 1 each by Does not apply route daily. E11.9    . repaglinide (PRANDIN) 2 MG tablet Take 1 tablet (2 mg total) by mouth 3 (three) times daily before meals. 90 tablet 2  . amLODipine (NORVASC) 5 MG tablet TAKE 1 TABLET (5 MG TOTAL) BY MOUTH DAILY. (Patient not taking: Reported on  01/04/2021) 30 tablet 0   No current facility-administered medications on file prior to visit.    Allergies  Allergen Reactions  . Aspirin Nausea And Vomiting    Uncoated aspirin Jittery GI upset  . Codeine Sulfate Nausea And Vomiting    Heart races.  . Glimepiride Diarrhea  . Metformin Diarrhea  . Propoxyphene N-Acetaminophen Nausea And Vomiting    Health Risk Assessment The patient has completed a Health Risk Assessment. This has been reviewed with the patient and has been scanned into the [facility] system as a separate document.   Current Medical Providers and Suppliers The providers who are involved in the care of this patient are listed above. Additional providers and suppliers are listed below: Clent Jacks, MD as consulting Ophthalmologist  She does have a Podiatrist but cannot recall his name at the moment.    Age-appropriate Screening Schedule Refer to the list in the Health Maintenance section for an age appropriated screening completed by this patient. Additional screening recommendations are listed below in the plan section. The patient has been provided with a written plan.    Health Maintenance Due  Topic Date Due  . DEXA SCAN  Never done  . OPHTHALMOLOGY EXAM  05/25/2010    Depression Screen Over the past two weeks have you:     Felt down or depressed? no     Had little interest or pleasure in doing things? no  Functional Ability/Safety Screen 1. Falls Risk: Does the patient need assistance with ambulation? no Does the patient have a history of a fall in the last 90 days? no Is the patient at risk for falls? yes, discussed safe ambulation Was the patient's timed "Get Up and Go Test" unsteady or longer than 30 seconds? Timing was 25 seconds   2. Does the patient need help with: Ron Parker index)         Bathing: no         Dressing : no         Toileting: no         Transferring: no         Continence: no         Feeding: no           3. Does the home have:          Rugs in the hallway: yes         Grab bars in the bathroom: yes         Handrails on the stairs:yes         Stairs in home: yes         Poor lightning: no           Hearing Evaluation:     Do you have trouble hearing the television when others do not? no     Do you have to strain to hear/understand conversations? no  Advanced Care Planning  Patient has executed an Advance Directive: yes     If no, patient was given the opportunity to execute an Advance Directive today? Patient already has Advanced Directive      Cognitive Assessment: Does the patient have evidence of cognitive impairment? No The patient does not have evidence of a change in mood/affect, appearance, speech, memory or motor skills.  Identification of Risk Factors: Risk factors include: possible fall risk   PHYSICAL EXAM: Vitals:   01/04/21 1114  BP: (!) 167/78  Pulse: 62  SpO2: 97%  Weight: 199 lb (90.3 kg)  Height: 5' 3.39" (1.61 m)   Body mass index is 34.82 kg/m. General appearance - alert, well appearing, and in no distress, oriented to person, place, and time and overweight Mental status - alert, oriented to person, place, and time, normal mood, behavior, speech, dress, motor activity, and thought processes Eyes - pupils equal and reactive, extraocular eye movements intact Ears - bilateral TM's and external ear canals normal Nose - normal and patent, no erythema, discharge or polyps Mouth - mucous membranes moist, pharynx normal without lesions Neck - supple, no significant adenopathy Lymphatics - no palpable lymphadenopathy, no hepatosplenomegaly Chest - clear to auscultation, no wheezes, rales or rhonchi, symmetric air entry, no tachypnea, retractions or cyanosis Heart - normal rate, regular rhythm, normal S1, S2, no murmurs, rubs, clicks or gallops Abdomen - soft, nontender, nondistended, no masses or organomegaly Breasts - breasts appear normal, no suspicious masses, no skin or nipple  changes or axillary nodes, Elmon Else, CMA present during examination Pelvic - patient declined examination Back exam - full range of motion, no tenderness, palpable spasm or pain on motion Neurological - alert, oriented, normal speech, no focal findings or movement disorder noted Musculoskeletal - no joint tenderness, deformity or swelling Extremities - peripheral pulses normal, no pedal edema, no clubbing or cyanosis Skin - normal coloration and turgor, no rashes, no suspicious skin lesions noted  ASSESSMENT AND PLAN: 1. Encounter for Medicare annual wellness exam: - Counseled on exercise as tolerated, healthy eating (including decreased daily intake of saturated fats, cholesterol, added sugars, sodium) and routine healthcare maintenance.  2. Screening for metabolic disorder: - CMP to check kidney function, liver function, and electrolyte balance.  - Comprehensive metabolic panel; Future  3. Screening for deficiency anemia: - CBC to screen for anemia. - CBC; Future  4. Screening cholesterol level: - Lipid panel to screen for high cholesterol.  - Lipid panel; Future  5. Thyroid disorder screen: - TSH to check thyroid function.  - TSH+T4F+T3Free; Future  6. Diabetic eye exam [facility]): - Referral to Ophthalmology for further evaluation and management.  - Ambulatory referral to Ophthalmology  7. Osteoporosis screening: - Bone density for osteoporosis screening. - DG Bone Density; Future  8. Essential hypertension: - Blood pressure not at goal during today's visit. Patient asymptomatic without chest pressure, chest pain, palpitations, shortness of breath, and worst headache of life. - Intolerant to Amlodipine related to causing leg/ankle swelling. Discontinued. - Continue Losartan-Hydrochlorothiazide as prescribed.  - Counseled on blood pressure goal of less than 140/90, low-sodium, DASH diet, medication compliance, and exercise as tolerated. Discussed medication compliance,  adverse effects. - Referral to [facility] for further evaluation and management. - Follow-up with primary provider as scheduled.  - Ambulatory referral to [facility] [place]  9. Neck pain:  - Diagnostic x-ray cervical spine for further evaluation.  - Ibuprofen as prescribed.  - Referral to Orthopedic Surgery for further evaluation  and management.  - Follow-up with primary provider as scheduled. - DG Cervical Spine Complete; Future - Ambulatory referral to Orthopedic Surgery - ibuprofen (ADVIL) 800 MG tablet; Take 1 tablet (800 mg total) by mouth every 8 (eight) hours as needed.  Dispense: 30 tablet; Refill: 0   Orders placed during this encounter include: Orders Placed This Encounter  Procedures  . DG Cervical Spine Complete    Standing Status:   Future    Number of Occurrences:   1    Standing Expiration Date:   01/04/2022    Order Specific Question:   Reason for Exam (SYMPTOM  OR DIAGNOSIS REQUIRED)    Answer:   neck pain    Order Specific Question:   Preferred imaging location?    Answer:   Internal  . DG Bone Density    Standing Status:   Future    Standing Expiration Date:   01/06/2022    Order Specific Question:   Reason for Exam (SYMPTOM  OR DIAGNOSIS REQUIRED)    Answer:   osteoporosis screening    Order Specific Question:   Preferred imaging location?    Answer:   [facility]  . CBC    Standing Status:   Future    Standing Expiration Date:   01/04/2022  . Comprehensive metabolic panel    Standing Status:   Future    Standing Expiration Date:   01/04/2022  . Lipid panel    Standing Status:   Future    Standing Expiration Date:   01/04/2022  . [id number]    Standing Status:   Future    Standing Expiration Date:   01/04/2022  . Ambulatory referral to [facility] [place]    Referral Priority:   Routine    Referral Type:   Consultation    Referral Reason:   Specialty Services  Required    Referred to Provider:   Skeet Latch, MD    Requested Specialty:   Cardiology    Number of Visits Requested:   1  . Ambulatory referral to Orthopedic Surgery    Referral Priority:   Routine    Referral Type:   Surgical    Referral Reason:   Specialty Services Required    Requested Specialty:   Orthopedic Surgery    Number of Visits Requested:   1  . Ambulatory referral to Ophthalmology    Referral Priority:   Routine    Referral Type:   Consultation    Referral Reason:   Specialty Services Required    Requested Specialty:   Ophthalmology    Number of Visits Requested:   1     Return in about 8 weeks (around 03/01/2021) for Follow-Up diabetes.   ? An after visit summary with all of these plans was given to the patient.

## 2021-01-04 NOTE — Patient Instructions (Signed)
[name] , Thank you for taking time to come for your Medicare Wellness Visit. I appreciate your ongoing commitment to your health goals. Please review the following plan we discussed and let me know if I can assist you in the future.   These are the goals we discussed: Goals    . Exercise 150 minutes per week (moderate activity)     Will try to go to community center and walk around trail  Will try to walk and build up to 30 minutes a day; especially;     . Patient will improve ease and safety entering and exiting car.     ACTION PLANNING - CUSTOM  Target Problem Area:  difficult to get out of car   Why Problem May Occur:  Car is low to the ground Nothing to hold onto to help get out Joints are stiff      Target Goal: Exit car with less difficulty and improved safety and independence    STRATEGIES Saving Your Energy: DO: DON'T:  Use autohandy bar to assist with entering and exiting car Drop into car or pull on car door when exiting car.   When entering car, sit and then swing legs in, when exiting, swing both legs out and then come to standing  Step legs one by one in and out of the car.            Modifying your home environment and making it safe: DO: DON'T:                  Simplifying the way you set up tasks or daily routines: DO: DON'T:            Practice It is important to practice the strategies so we can determine if they will be effective in helping to reach your goal. Follow these specific recommendations: 1.  Use auto handybar for improved independence and safety when transferring in and out of car.  2. 3.  If a strategy does not work the first time, try it again and again (and maybe again). We may make some changes over the next few sessions, based on how they work.  Rachel [name], [place], OTR/L [phone]       . Patient will improve ease with retrieving items from the floor and from overhead.     ACTION PLANNING - CUSTOM   Target Problem Area:  difficulty retrieving items from overhead cabinets and from floor.   Why Problem May Occur: Height of patient, height of cabinets Objects on floor in kitchen not allowing patient to get close to cabinet Pain in left shoulder makes reaching difficulty Leaning over too long makes her dizzy   Target Goal:  safely retrieve items from overhead cabinet and from floor    STRATEGIES Saving Your Energy: DO: DON'T:  Move frequently used items to bottom shelf or countertop. Over reach - can injure your back or shoulder  Keep frequently used pots/pans on counter/stove and dishes in dishdrainer Keep frequently used items in difficult to reach places  Use reacher to pick up items from out of reach cabinets or from floor Use chair as a stepstool  Use stepstool to reach into overhead cabinets   Stand square to cabinet when reaching overhead and to side of cabinet when reaching into low cabinets    Modifying your home environment and making it safe: DO: DON'T:  Simplifying the way you set up tasks or daily routines: DO: DON'T:            Practice It is important to practice the strategies so we can determine if they will be effective in helping to reach your goal. Follow these specific recommendations: 1.  Use reacher and step stool to access shelves that are too high or low. 2.    Move items from 3rd shelf to 1st shelf or counter top. 3.  If a strategy does not work the first time, try it again and again (and maybe again). We may make some changes over the next few sessions, based on how they work.   Rachel [name], [place], OTR/L [phone]       . Patient will improve safety and independence with bathroom transfers and tasks.    . Patient will improve safety when ambulating in home.    . retire and do more traveling, enjoy life.       This is a list of the screening recommended for you and due dates:  Health Maintenance  Topic Date  Due  . DEXA scan (bone density measurement)  Never done  . Eye exam for diabetics  05/25/2010  . Tetanus Vaccine  11/03/2021*  . Complete foot exam   03/31/2021  . Flu Shot  04/24/2021  . Hemoglobin A1C  05/25/2021  . COVID-19 Vaccine  Completed  .  Hepatitis C: One time screening is recommended by Center for Disease Control  (CDC) for  adults born from [birth year] through 1963.   Completed  . Pneumonia vaccines  Completed  . HPV Vaccine  Aged Out  *Topic was postponed. The date shown is not the original due date.

## 2021-01-04 NOTE — Progress Notes (Signed)
Annual exam  Neck pain for about 1 week, has been taking Ibuprofen 600mg  to relieve pain, allows limited range of motion Pt states that Amlodipine is causing swelling

## 2021-01-05 NOTE — Progress Notes (Signed)
Essentially normal neck x-ray. Continue with plan of care discussed during yesterday's office visit.

## 2021-01-06 MED ORDER — IBUPROFEN 800 MG PO TABS: 800.0000 mg | ORAL_TABLET | Freq: Three times a day (TID) | ORAL | 0 refills | Status: DC | PRN

## 2021-01-11 ENCOUNTER — Other Ambulatory Visit: Payer: PPO

## 2021-01-12 ENCOUNTER — Other Ambulatory Visit: Payer: Self-pay

## 2021-01-12 ENCOUNTER — Ambulatory Visit (INDEPENDENT_AMBULATORY_CARE_PROVIDER_SITE_OTHER): Payer: PPO | Admitting: Family Medicine

## 2021-01-12 ENCOUNTER — Encounter: Payer: Self-pay | Admitting: Family Medicine

## 2021-01-12 DIAGNOSIS — M542 Cervicalgia: Secondary | ICD-10-CM | POA: Diagnosis not present

## 2021-01-12 MED ORDER — BACLOFEN 10 MG PO TABS: 5.0000 mg | ORAL_TABLET | Freq: Three times a day (TID) | ORAL | 3 refills | Status: DC | PRN

## 2021-01-12 NOTE — Progress Notes (Signed)
Office Visit Note   Patient: Rachel [name]           Date of Birth: [birth year]           MRN: [id number] Visit Date: 01/12/2021 Requested by: Camillia Herter, NP [address] [place],  [place] [address] PCP: Camillia Herter, NP  Subjective: Chief Complaint  Patient presents with  . Neck - Pain    Neck pain x 2 weeks. "Spasms" in the neck bilaterally - decreased ROM. She recalls no specific injury, but her daughter said she did move a table recently. Most of the pain is around her hairline, posterior neck. Some headaches but no dizziness. Pain radiates across top of both shoulders. Has had xrays done by PCP.    HPI: She is here with neck pain.  She is left-hand dominant.  Symptoms started about 2 weeks ago, no definite injury but she has lifted a table recently and also takes care of a 43-year-old child and does a lot of lifting.  Pain at the base of the skull, first on the left side and now on both sides.  Occasional radiation into the shoulders but not down the arms.  No numbness or tingling, no weakness.  She took some ibuprofen and that gave her a little bit of relief.  She she has trouble sleeping at night because of her pain.  No previous problems with her neck.  She has hypertension and diabetes which are marginally well controlled per patient report.                ROS:   All other systems were reviewed and are negative.  Objective: Vital Signs: There were no vitals taken for this visit.  Physical Exam:  General:  Alert and oriented, in no acute distress. Pulm:  Breathing unlabored. Psy:  Normal mood, congruent affect. Skin: No rash Neck: She has pain with rotation bilaterally with lightly decreased range of motion.  Spurling's test negative.  Tender trigger points in the upper cervical spine, especially on the left at the C2-3 level.  Upper extremity strength and reflexes are normal.  Imaging: Recent x-rays viewed on computer show mild cervical facet  arthropathy.  Disc spaces are well-preserved.  No sign of compression fracture or neoplasm.    Assessment & Plan: 1.  Mechanical neck pain, suspect myofascial.  Cannot rule out facet mediated pain. -We will try physical therapy.  Baclofen as needed.  If symptoms persist, MRI scan.     Procedures: No procedures performed        PMFS History: Patient Active Problem List   Diagnosis Date Noted  . Candidiasis of skin 03/17/2019  . 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 Mild cardiomegaly 01/03/2018  . Well adult exam 12/14/2015  . Acute UTI 12/14/2015  . 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 Bacterial pharyngitis 08/25/2014  . Acute bronchitis 07/03/2014  . Oral thrush 07/03/2014  . Onychomycosis 02/18/2013  . URI (upper respiratory infection) 07/11/2012  . Cough 07/11/2012  . Ganglion cyst 06/25/2011  . Foot pain, left 06/25/2011  . Otitis media 06/25/2011  . LIPOMA 11/03/2010  . EUSTACHIAN TUBE DYSFUNCTION, RIGHT 02/18/2010  . Asthma with acute exacerbation 11/02/2009  . ONYCHOMYCOSIS, TOENAILS 05/18/2009  . Dyslipidemia 09/15/2008  . DM2 (diabetes mellitus, type 2) ([place]) 04/19/2007  . Essential hypertension 04/19/2007  . Allergic rhinitis 04/19/2007  . OSTEOARTHRITIS 04/19/2007   Past Medical History:  Diagnosis Date  . Allergic rhinitis   . Diabetes mellitus, type 2 ([place])   .  Hyperlipemia   . Hypertension   . Lipoma NEC    anterior upper chest  . Osteoarthritis of knee     Family History  Problem Relation Age of Onset  . Cancer Father        brain  . Diabetes Mother   . Heart attack Mother   . Diabetes Sister   . Diabetes Brother   . Hypertension Sister   . Hypertension Brother   . Aneurysm Other        niece  . Coronary artery disease Brother     Past Surgical History:  Procedure Laterality Date  . ACNE CYST REMOVAL  1970   back  . APPENDECTOMY  1963  . OOPHORECTOMY  1985  . VAGINAL HYSTERECTOMY  1985   complete   Social History   Occupational History   . Occupation: department mang    Employer: [employer]: Retired  Tobacco Use  . Smoking status: Never Smoker  . Smokeless tobacco: Never Used  Vaping Use  . Vaping Use: Never used  Substance and Sexual Activity  . Alcohol use: Yes    Alcohol/week: 1.0 standard drink    Types: 1 Glasses of wine per week    Comment: ocassionally  . Drug use: No  . Sexual activity: Not Currently

## 2021-01-25 ENCOUNTER — Ambulatory Visit (INDEPENDENT_AMBULATORY_CARE_PROVIDER_SITE_OTHER): Payer: PPO | Admitting: Physical Therapy

## 2021-01-25 ENCOUNTER — Encounter: Payer: Self-pay | Admitting: Physical Therapy

## 2021-01-25 ENCOUNTER — Other Ambulatory Visit: Payer: Self-pay

## 2021-01-25 ENCOUNTER — Ambulatory Visit: Payer: PPO | Admitting: Physical Therapy

## 2021-01-25 DIAGNOSIS — M542 Cervicalgia: Secondary | ICD-10-CM

## 2021-01-25 DIAGNOSIS — M62838 Other muscle spasm: Secondary | ICD-10-CM | POA: Diagnosis not present

## 2021-01-25 DIAGNOSIS — M436 Torticollis: Secondary | ICD-10-CM | POA: Diagnosis not present

## 2021-01-25 NOTE — Patient Instructions (Signed)
Access CodAccess Code: [email or web address] URL: [email or web address] Date: 01/25/2021 Prepared by: Jeral Pinch  Exercises Standing Cervical Retraction - 5-6 x daily - 10 reps Shoulder External Rotation and Scapular Retraction with Resistance - 5-6 x daily - 10 reps Standing Backward Shoulder Rolls - 5-6 x daily - 10 reps Seated Upper Trapezius Stretch - 2 x daily - 2 reps - 20-30sec hold Doorway Pec Stretch at 60 Elevation - 2 x daily - 2 reps - 20-30 sec hold  Patient Education Trigger Point Dry Needlinge: [facility]

## 2021-01-25 NOTE — Therapy (Signed)
[facility] [address] [place], [place], [address] Phone: [phone]   Fax:  [phone]  Physical Therapy Evaluation  Patient Details  Name: Rachel [name] MRN: [id number] Date of Birth: March 06, 1941 Referring Provider (PT): Dr Eunice Blase   Encounter Date: 01/25/2021   PT End of Session - 01/25/21 0928    Visit Number 1    Number of Visits 12    Date for PT Re-Evaluation 03/08/21    Authorization Type [employer] advantage    Progress Note Due on Visit 10    PT Start Time 0930    PT Stop Time 1018    PT Time Calculation (min) 48 min    Activity Tolerance Patient tolerated treatment well;Patient limited by pain    Behavior During Therapy [facility] for tasks assessed/performed           Past Medical History:  Diagnosis Date  . Allergic rhinitis   . Diabetes mellitus, type 2 ([place])   . Hyperlipemia   . Hypertension   . Lipoma NEC    anterior upper chest  . Osteoarthritis of knee     Past Surgical History:  Procedure Laterality Date  . ACNE CYST REMOVAL  1970   back  . APPENDECTOMY  1963  . OOPHORECTOMY  1985  . VAGINAL HYSTERECTOMY  1985   complete    There were no vitals filed for this visit.    Subjective Assessment - 01/25/21 0935    Subjective Pt developed neck pain about a month ago, she thinks she may have hurt it while caring for a one year old grandchild.  Has noticed that her pain is worse with changing and lifting him up.  The pain is now interfering with her sleep now.  She is using muscle relaxers at night.  The pain does sometimes go into the Lt shoulder/neck    Diagnostic tests xray - some arthritis    Patient Stated Goals sleep at night like she used to ,    Currently in Pain? Yes    Pain Score 7    at night 9/10   Pain Location Neck    Pain Orientation Right   occassionally to left side now   Pain Descriptors / Indicators Shooting    Pain Type Acute pain    Pain Onset More than a month ago     Pain Frequency Constant    Aggravating Factors  caring for her granchild and sleeping    Pain Relieving Factors muscle relaxers , ice              [facility] PT Assessment - 01/25/21 0001      Assessment   Medical Diagnosis Neck pain    Referring Provider (PT) Dr Legrand Como [place]    Onset Date/Surgical Date 12/29/20    Hand Dominance Left    Next MD Visit after PT    Prior Therapy none      Precautions   Precautions None      Balance Screen   Has the patient fallen in the past 6 months No    Has the patient had a decrease in activity level because of a fear of falling?  No    Is the patient reluctant to leave their home because of a fear of falling?  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  Leisure cares for her grandson      Observation/Other Assessments   Focus on Therapeutic Outcomes (FOTO)  34, Goal 60      Posture/Postural Control   Posture/Postural Control Postural limitations    Postural Limitations Rounded Shoulders;Forward head      ROM / Strength   AROM / PROM / Strength AROM;Strength      AROM   AROM Assessment Site Shoulder;Cervical    Right/Left Shoulder --   bilat flex 120, all others WNL - pain with reaching behind   Cervical Flexion WNL    Cervical Extension 16 with pain    Cervical - Right Rotation 36 with pain    Cervical - Left Rotation 49 some pain      Strength   Strength Assessment Site Shoulder;Elbow    Right/Left Shoulder --   WNL except flex/abduction 4-/5 d/t pain   Right/Left Elbow --   5/5                     Objective measurements completed on examination: See above findings.       [facility] Adult PT Treatment/Exercise - 01/25/21 0001      Exercises   Exercises Neck      Neck Exercises: Seated   Neck Retraction 10 reps    Shoulder Rolls Backwards;10 reps    Other Seated Exercise scapular  retraction with shoudler ER x 10 reps      Modalities   Modalities Electrical Stimulation;Moist Heat      Moist Heat Therapy   Number Minutes Moist Heat 10 Minutes    Moist Heat Location Cervical      Electrical Stimulation   Electrical Stimulation Location bilat cervical    Electrical Stimulation Action IFC    Electrical Stimulation Parameters to tolerance    Electrical Stimulation Goals Tone;Pain      Neck Exercises: Stretches   Upper Trapezius Stretch Left;Right;30 seconds    Other Neck Stretches low doorway stretch x 30 sec                  PT Education - 01/25/21 0934    Education Details HEP, POC, DN    Person(s) Educated Patient    Methods Explanation;Demonstration;Handout    Comprehension Returned demonstration;Verbalized understanding               PT Long Term Goals - 01/25/21 0933      PT LONG TERM GOAL #1   Title I with advanced HEP    Time 6    Period Weeks    Status New    Target Date 03/08/21      PT LONG TERM GOAL #2   Title improve FOTO =/> 60    Time 6    Period Weeks    Status New    Target Date 03/08/21      PT LONG TERM GOAL #3   Title report =/> 75% reduction of neck pain with daily acitivites and lifting her great grandson    Time 6    Period Weeks    Status New    Target Date 03/08/21      PT LONG TERM GOAL #4   Title improve cervcal rotation bilat =/> 60 degrees to assist with looking for traffic    Time 6    Period Weeks    Status New    Target Date 03/08/21      PT LONG TERM GOAL #5   Title report ability to  sleep per her baseline    Time 6    Period Weeks    Status New    Target Date 03/08/21                  Plan - 01/25/21 1034    Clinical Impression Statement 80 yo female with onset of neck pain about a month ago.  She reports it started on the left upper shoulder and neck and has now moved over to the right side.  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  She is now having diffficulty picking him up and leaning forward to care for him.  She has limited cervical motion due to pain as well as weakness with shoulder elevation d/t pain.  Her upper traps, pericerivcal muscles and scalenes are very tight and tender to palpation.   She also presents with FWD head, rounded shoulders and increased thoracic kyphosis.  These are all attributing to her symptoms. Rachel [name] would benefit from PT to address these deficits to restore her PLOF and allow her to continue to care for her great grandson.    Personal Factors and Comorbidities Comorbidity 3+    Comorbidities DM, HTN, OA    Examination-Activity Limitations Caring for Others;Reach Overhead;Sleep    Examination-Participation Restrictions Other;Community Activity    Stability/Clinical Decision Making Stable/Uncomplicated    Clinical Decision Making Low    Rehab Potential Good    PT Frequency 2x / week    PT Duration 6 weeks    PT Treatment/Interventions Iontophoresis 4mg /ml Dexamethasone;Taping;Patient/family education;Functional mobility training;Moist Heat;Traction;Cryotherapy;Electrical Stimulation;Neuromuscular re-education;Manual techniques;Spinal Manipulations;Therapeutic exercise;Passive range of motion    PT Next Visit Plan possible DN to cervical and upper shoulders, manual work to this area, postural correction exercise and modalities PRN for pain and tone    PT Home Exercise Plan [email or web address]    Consulted and Agree with Plan of Care Patient           Patient will benefit from skilled therapeutic intervention in order to improve the following deficits and impairments:  Postural dysfunction,Decreased strength,Pain,Increased muscle spasms,Impaired UE functional use,Obesity,Decreased range of motion  Visit Diagnosis: Cervicalgia - Plan: PT plan of care cert/re-cert  Stiffness of cervical spine - Plan: PT plan of care cert/re-cert  Other muscle spasm -  Plan: PT plan of care cert/re-cert     Problem List Patient Active Problem List   Diagnosis Date Noted  . Candidiasis of skin 03/17/2019  . 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 Mild cardiomegaly 01/03/2018  . Well adult exam 12/14/2015  . Acute UTI 12/14/2015  . 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 Bacterial pharyngitis 08/25/2014  . Acute bronchitis 07/03/2014  . Oral thrush 07/03/2014  . Onychomycosis 02/18/2013  . URI (upper respiratory infection) 07/11/2012  . Cough 07/11/2012  . Ganglion cyst 06/25/2011  . Foot pain, left 06/25/2011  . Otitis media 06/25/2011  . LIPOMA 11/03/2010  . EUSTACHIAN TUBE DYSFUNCTION, RIGHT 02/18/2010  . Asthma with acute exacerbation 11/02/2009  . ONYCHOMYCOSIS, TOENAILS 05/18/2009  . Dyslipidemia 09/15/2008  . DM2 (diabetes mellitus, type 2) ([place]) 04/19/2007  . Essential hypertension 04/19/2007  . Allergic rhinitis 04/19/2007  . OSTEOARTHRITIS 04/19/2007    Jeral Pinch PT  01/25/2021, 1:57 PM  [facility] Physical Therapy [address] [place], [place], [address] Phone: [phone]   Fax:  [phone]  Name: Rachel [name] MRN: [id number] Date of Birth: [birth year]

## 2021-01-30 ENCOUNTER — Other Ambulatory Visit: Payer: Self-pay | Admitting: Internal Medicine

## 2021-02-08 ENCOUNTER — Other Ambulatory Visit: Payer: Self-pay

## 2021-02-08 ENCOUNTER — Ambulatory Visit: Payer: PPO | Admitting: Physical Therapy

## 2021-02-08 ENCOUNTER — Encounter: Payer: Self-pay | Admitting: Physical Therapy

## 2021-02-08 DIAGNOSIS — M436 Torticollis: Secondary | ICD-10-CM | POA: Diagnosis not present

## 2021-02-08 DIAGNOSIS — M542 Cervicalgia: Secondary | ICD-10-CM | POA: Diagnosis not present

## 2021-02-08 DIAGNOSIS — M62838 Other muscle spasm: Secondary | ICD-10-CM | POA: Diagnosis not present

## 2021-02-08 NOTE — Therapy (Addendum)
[facility] Physical Therapy [address] [place], [place], [address] Phone: [phone]   Fax:  [phone]  Physical Therapy Treatment/Discharge Summary  Patient Details  Name: Rachel [name] MRN: [id number] Date of Birth: November 24, 1940 Referring Provider (PT): Dr Eunice Blase   Encounter Date: 02/08/2021   PT End of Session - 02/08/21 1008     Visit Number 2    Number of Visits 12    Date for PT Re-Evaluation 03/08/21    Authorization Type [employer]hte[employer] advantage    Progress Note Due on Visit 10    PT Start Time 0930    PT Stop Time 1008    PT Time Calculation (min) 38 min    Activity Tolerance Patient tolerated treatment well;Patient limited by pain    Behavior During Therapy [facility] for tasks assessed/performed             Past Medical History:  Diagnosis Date   Allergic rhinitis    Diabetes mellitus, type 2 ([place])    Hyperlipemia    Hypertension    Lipoma NEC    anterior upper chest   Osteoarthritis of knee     Past Surgical History:  Procedure Laterality Date   ACNE CYST REMOVAL  1970   back   [place]   complete    There were no vitals filed for this visit.   Subjective Assessment - 02/08/21 0930     Subjective watching a 78 year old until school is out about 2 hours a day (June 3). lifting the child is aggravating.  doing exercises 2x/day "it hurts, but i'm sure it's helping"  reports decreasing use of pain medications    Diagnostic tests xray - some arthritis    Patient Stated Goals sleep at night like she used to ,    Currently in Pain? Yes    Pain Score 7     Pain Location Neck    Pain Orientation Right    Pain Descriptors / Indicators Aching;Shooting    Pain Type Acute pain    Pain Onset More than a month ago    Pain Frequency Constant    Aggravating Factors  caring for great grandchild and sleeping    Pain Relieving Factors muscle relaxers, ice                                [facility] Adult PT Treatment/Exercise - 02/08/21 0936       Self-Care   Self-Care Other Self-Care Comments    Other Self-Care Comments  discussed dry needling with pt - pt requested to hold for now; instructed in use of tennis balls at home for SOR      Neck Exercises: Machines for Strengthening   UBE (Upper Arm Bike) L2.0 x 6 min (3' each direction)      Neck Exercises: Seated   Neck Retraction 10 reps;5 secs    Shoulder Rolls Backwards;10 reps    Other Seated Exercise scapular retraction with shoudler ER x 10 reps      Neck Exercises: Stretches   Upper Trapezius Stretch Right;Left;3 reps;30 seconds                    PT Education - 02/08/21 1008     Education Details see self care    Person(s) Educated Patient    Methods Explanation;Handout;Demonstration    Comprehension  Verbalized understanding                 PT Long Term Goals - 01/25/21 0933       PT LONG TERM GOAL #1   Title I with advanced HEP    Time 6    Period Weeks    Status New    Target Date 03/08/21      PT LONG TERM GOAL #2   Title improve FOTO =/> 60    Time 6    Period Weeks    Status New    Target Date 03/08/21      PT LONG TERM GOAL #3   Title report =/> 75% reduction of neck pain with daily acitivites and lifting her great grandson    Time 6    Period Weeks    Status New    Target Date 03/08/21      PT LONG TERM GOAL #4   Title improve cervcal rotation bilat =/> 60 degrees to assist with looking for traffic    Time 6    Period Weeks    Status New    Target Date 03/08/21      PT LONG TERM GOAL #5   Title report ability to sleep per her baseline    Time 6    Period Weeks    Status New    Target Date 03/08/21                   Plan - 02/08/21 1009     Clinical Impression Statement Session today mainly focused on review of HEP needing mod cues for technique, and added SOR with tennis balls to HEP today as well.   No goals met as only 2nd visit and will continue to benefit from PT to maximize function.    Personal Factors and Comorbidities Comorbidity 3+    Comorbidities DM, HTN, OA    Examination-Activity Limitations Caring for Others;Reach Overhead;Sleep    Examination-Participation Restrictions Other;Community Activity    Stability/Clinical Decision Making Stable/Uncomplicated    Rehab Potential Good    PT Frequency 2x / week    PT Duration 6 weeks    PT Treatment/Interventions Iontophoresis 69m/ml Dexamethasone;Taping;Patient/family education;Functional mobility training;Moist Heat;Traction;Cryotherapy;Electrical Stimulation;Neuromuscular re-education;Manual techniques;Spinal Manipulations;Therapeutic exercise;Passive range of motion    PT Next Visit Plan possible DN to cervical and upper shoulders (wants to wait a few weeks), manual work to this area, postural correction exercise and modalities PRN for pain and tone    PT Home Exercise Plan [email or web address]    Consulted and Agree with Plan of Care Patient             Patient will benefit from skilled therapeutic intervention in order to improve the following deficits and impairments:  Postural dysfunction,Decreased strength,Pain,Increased muscle spasms,Impaired UE functional use,Obesity,Decreased range of motion  Visit Diagnosis: Cervicalgia  Stiffness of cervical spine  Other muscle spasm     Problem List 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   Bacterial pharyngitis 08/25/2014   Acute bronchitis 07/03/2014   Oral thrush 07/03/2014   Onychomycosis 02/18/2013   URI (upper respiratory infection) 07/11/2012   Cough 07/11/2012   Ganglion cyst 06/25/2011   Foot pain, left 06/25/2011   Otitis media 06/25/2011   LIPOMA 11/03/2010   EUSTACHIAN TUBE  DYSFUNCTION, RIGHT 02/18/2010   Asthma with acute exacerbation 11/02/2009   ONYCHOMYCOSIS, TOENAILS 05/18/2009   Dyslipidemia 09/15/2008   DM2 (diabetes mellitus, type 2) ([place]) 04/19/2007   Essential hypertension 04/19/2007   Allergic rhinitis 04/19/2007   OSTEOARTHRITIS 04/19/2007      Laureen Abrahams, PT, DPT 02/08/21 10:10 AM    [facility] [address] [place], [place], [address] Phone: [phone]   Fax:  [phone]  Name: Carren Blakley MRN: [id number] Date of Birth: 21-Jul-1941    PHYSICAL THERAPY DISCHARGE SUMMARY  Visits from Start of Care: 2  Current functional level related to goals / functional outcomes: See above   Remaining deficits: See above; other deficits unknown   Education / Equipment: HEP   Patient agrees to discharge. Patient goals were not met. Patient is being discharged due to not returning since the last visit.  Laureen Abrahams, PT, DPT 05/08/21 11:32 AM  [facility] Physical Therapy [address] [place], [place], [address] Phone: [phone]   Fax:  [phone]

## 2021-02-08 NOTE — Patient Instructions (Signed)
Access Code: [email or web address] URL: [email or web address] Date: 02/08/2021 Prepared by: Faustino Congress  Exercises Standing Cervical Retraction - 5-6 x daily - 10 reps Shoulder External Rotation and Scapular Retraction with Resistance - 5-6 x daily - 10 reps Standing Backward Shoulder Rolls - 5-6 x daily - 10 reps Seated Upper Trapezius Stretch - 2 x daily - 2 reps - 20-30sec hold Doorway Pec Stretch at 60 Elevation - 2 x daily - 2 reps - 20-30 sec hold Supine Suboccipital Release with Tennis Balls - 1 x daily - 7 x weekly - 3-5 min hold  Patient Education Trigger Point Dry Needling

## 2021-02-15 ENCOUNTER — Encounter: Payer: PPO | Admitting: Physical Therapy

## 2021-02-22 ENCOUNTER — Encounter: Payer: PPO | Admitting: Physical Therapy

## 2021-02-24 ENCOUNTER — Other Ambulatory Visit: Payer: Self-pay | Admitting: Internal Medicine

## 2021-02-28 ENCOUNTER — Encounter: Payer: Self-pay | Admitting: Specialist

## 2021-03-01 ENCOUNTER — Encounter: Payer: PPO | Admitting: Physical Therapy

## 2021-03-08 ENCOUNTER — Other Ambulatory Visit: Payer: Self-pay

## 2021-03-08 ENCOUNTER — Other Ambulatory Visit: Payer: Self-pay | Admitting: Specialist

## 2021-03-08 NOTE — Patient Outreach (Signed)
Aging Gracefully Program  OT FINAL Visit  03/08/2021  Rachel [name] [facility] [place] Nov 29, 1940 [id number]  Visit:  4- Fourth Visit  Start Time:  0920 End Time:  1020 Total Minutes:  60   Readiness to Change:  Readiness to Change Score: 10      Durable Medical Equipment: Durable Medical Equipment: Shower Chair With Back Durable Medical Equipment Distribution Date: 03/08/21  Patient Education: Education Provided: Yes Education Details: educated patient on proper use of shower seat with back for improved safety when showering.  educated patient on use of "Tips for Aging In Place"  booklet for improved ability to problem solve and remain safe in her home for years to come. Person(s) Educated: Patient Comprehension: Verbalized Understanding  Goals:  Goals Addressed             This Visit's Progress    COMPLETED: Patient will improve ease and safety entering and exiting car.       ACTION PLANNING - CUSTOM  Target Problem Area:  difficult to get out of car   Why Problem May Occur:  Car is low to the ground Nothing to hold onto to help get out Joints are stiff      Target Goal: Exit car with less difficulty and improved safety and independence    STRATEGIES Saving Your Energy: DO: DON'T:  Use autohandy bar to assist with entering and exiting car Drop into car or pull on car door when exiting car.   When entering car, sit and then swing legs in, when exiting, swing both legs out and then come to standing  Step legs one by one in and out of the car.           Modifying your home environment and making it safe: DO: DON'T:                 Simplifying the way you set up tasks or daily routines: DO: DON'T:            Practice It is important to practice the strategies so we can determine if they will be effective in helping to reach your goal. Follow these specific recommendations:  Use auto handybar for improved independence and safety when transferring  in and out of car.  2. 3.  If a strategy does not work the first time, try it again and again (and maybe again). We may make some changes over the next few sessions, based on how they work.  Rachel [name], MHA, OTR/L [phone]         COMPLETED: Patient will improve ease with retrieving items from the floor and from overhead.       ACTION PLANNING - CUSTOM  Target Problem Area:  difficulty retrieving items from overhead cabinets and from floor.   Why Problem May Occur: Height of patient, height of cabinets Objects on floor in kitchen not allowing patient to get close to cabinet Pain in left shoulder makes reaching difficulty Leaning over too long makes her dizzy   Target Goal:  safely retrieve items from overhead cabinet and from floor    STRATEGIES Saving Your Energy: DO: DON'T:  Move frequently used items to bottom shelf or countertop. Over reach - can injure your back or shoulder  Keep frequently used pots/pans on counter/stove and dishes in dishdrainer Keep frequently used items in difficult to reach places  Use reacher to pick up items from out of reach cabinets or from floor Use chair as a stepstool  Use  stepstool to reach into overhead cabinets   Stand square to cabinet when reaching overhead and to side of cabinet when reaching into low cabinets   Modifying your home environment and making it safe: DO: DON'T:                 Simplifying the way you set up tasks or daily routines: DO: DON'T:            Practice It is important to practice the strategies so we can determine if they will be effective in helping to reach your goal. Follow these specific recommendations:  Use reacher and step stool to access shelves that are too high or low. 2.    Move items from 3rd shelf to 1st shelf or counter top. 3.  If a strategy does not work the first time, try it again and again (and maybe again). We may make some changes over the next few sessions, based on  how they work.   Rachel [name], MHA, OTR/L [phone]         COMPLETED: Patient will improve safety and independence with bathroom transfers and tasks.       ACTION PLANNING - BATHING Target Problem Area: Difficulty and unsafe getting on and off commode and in and out of the shower for bathing  Why Problem May Occur: Commode is low Nothing to hold onto when stepping over tub wall or when balancing in shower        Target Goal: Be safe and confident when completing bathing, toilet transfers and tub transfers    STRATEGIES Saving Your Energy: DO: DON'T:  Use a tub bench/seat Stand while bathing, it uses more energy  Use appropriate adaptive equipment:  long handled sponge, soap on a rope Rush  Keep all items you'll need within easy reach   Other  install comfort level commode    Other   Modifying your home environment and making it safe: DO: DON'T:  Install grab bars n the shower and next to the toilet   Place a rubber mat along the entire length of the tub Place loose rugs in the bathroom- they can trip you or your walker/cane can get caught on them  Make sure the bathroom is well lit    Install a hand held shower head   Simplifying the way you set up tasks or daily routines: DO: DON'T:  Plan to bathe/shower before you're overly tired Rush through [employer] all items before getting started   Other   Other   Other    PRACTICE It is important to practice the strategies so we can determine if they will be effective in helping to reach your goal. Follow these specific recommendations:  Use grab bars to enter and exit shower  2.   Use shower seat with back to sit while showering when fatigued or experiencing increased pain.  3.  If a strategy does not work the first time, try it again and again (and maybe again). We may make some changes over the next few sessions, based on how they work.         COMPLETED: Patient will improve safety when  ambulating in home.   On track    ACTION PLANNING - FUNCTIONAL MOBILITY Target Problem Area: Unsafe ambulating in home   Why Problem May Occur: No handrails on steps Flooring is weak and uneven in kitchen and entry way Carpet is torn and uneven in upstairs and on steps  Target Goal: Safely ambulate throughout home.    STRATEGIES Saving Your Energy: DO: DON'T:  Take breaks    Raise the height of surfaces    Take frequent rests. Just taking 15 minutes in a comfortable chair before becoming fatigued may help to restore your energy   Remove tripping hazards     Other   Modifying your home environment and making it safe: DO: DON'T:  Install grab bars in the bathroom Hold onto unsafe surfaces (towel racks, shower curtains, soap dishes, etc)  Remove or strongly secure throw rugs    Provide adequate lighting Use dim lights or lights that cast a lot of shadows  Other   Other   Simplifying the way you set up tasks or daily routines: DO: DON'T:  Move slowly Rush during transfers or walking   Other   Other    Practice It is important to practice the strategies so we can determine if they will be effective in helping to reach your goal. Follow these specific recommendations:  Use railings on steps when going up and down steps to upstairs and downstairs.  2. 3.  If a strategy does not work the first time, try it again and again (and maybe again). We may make some changes over the next few sessions, based on how they work.   Insert signature line            Post Clinical Reasoning: Client Action (Goal) One Interventions: Improve safety and independence when ambulating in her home. Did Client Try?: Yes Targeted Problem Area Status: A Lot Better Client Action (Goal) Two Interventions: Improve safety and independence when transferring on and off commode, in and out of shower, and completing bathing tasks. Did Client Try?: Yes Targeted Problem Area Status: A Lot  Better Clinician View Of Client Situation:: Ms. Gauntt is safer and more confident with ambulating in her home and completing transfers on and off the commode, in and out of the shower, and competing bathing tasks with the modifications and DME.  She is very satisfied with her current level of independence and feels confident that she can problem solve through issues that will arise in the future. Client View Of His/Her Situation:: patient is very satisfied with her current level of independence and feels she has met all of her OT goals.  She reports that she didnt realize how much trouble she was actually having until her modifications were made and she now sees how much easier and safer tasks can be completed. Next Visit Plan:: dc from OT AG interventions this date. Rachel [name], [place], OTR/L [phone]

## 2021-03-15 ENCOUNTER — Other Ambulatory Visit: Payer: Self-pay | Admitting: Internal Medicine

## 2021-03-21 ENCOUNTER — Other Ambulatory Visit: Payer: Self-pay

## 2021-03-21 NOTE — Patient Outreach (Signed)
Aging Gracefully Program  03/21/2021  Rachel [name] 1940/10/28 [id number]  [facility] Evaluation Interviewer made contact with patient. Aging Gracefully survey scheduled for Wednesday, July 7 at 10:30 a.m.  [place] Management Assistant  [phone]

## 2021-04-03 ENCOUNTER — Ambulatory Visit (HOSPITAL_BASED_OUTPATIENT_CLINIC_OR_DEPARTMENT_OTHER): Payer: PPO | Admitting: Cardiovascular Disease

## 2021-04-04 ENCOUNTER — Other Ambulatory Visit: Payer: Self-pay

## 2021-04-04 NOTE — Patient Outreach (Signed)
Aging Gracefully Program  04/04/2021  Inioluwa Boulay Kiely 1941-07-08 [id number]   [facility] Evaluation Interviewer attempted to call patient on today regarding Aging Gracefully referral no show on 03/29/21. No answer from patient after multiple rings. CMA called patients daughter who is emergency contact and was advised to call back in 30 minutes.  [place] Management Assistant [phone]

## 2021-04-04 NOTE — Patient Outreach (Signed)
Aging Gracefully Program  04/04/2021  Rachel [name] [birth year] [id number]  [facility] Evaluation Interviewer made contact with patient. Aging Gracefully 5 month survey completed.   Interviewer will send referral to Sharol Given at [employer] [facility] follow up.  [place] Management Assistant  [phone]

## 2021-04-13 ENCOUNTER — Other Ambulatory Visit: Payer: Self-pay | Admitting: Internal Medicine

## 2021-04-13 ENCOUNTER — Other Ambulatory Visit: Payer: Self-pay | Admitting: Podiatry

## 2021-04-14 NOTE — Telephone Encounter (Signed)
Please Advise

## 2021-04-17 NOTE — Telephone Encounter (Signed)
OTC item

## 2021-04-22 ENCOUNTER — Other Ambulatory Visit: Payer: Self-pay | Admitting: Internal Medicine

## 2021-05-31 ENCOUNTER — Ambulatory Visit
Admission: EM | Admit: 2021-05-31 | Discharge: 2021-05-31 | Disposition: A | Discharge status: Home / Self Care | Payer: PPO | Attending: Urgent Care | Admitting: Urgent Care

## 2021-05-31 ENCOUNTER — Other Ambulatory Visit: Payer: Self-pay

## 2021-05-31 ENCOUNTER — Encounter: Payer: Self-pay | Admitting: Emergency Medicine

## 2021-05-31 DIAGNOSIS — H9201 Otalgia, right ear: Secondary | ICD-10-CM

## 2021-05-31 DIAGNOSIS — H6981 Other specified disorders of Eustachian tube, right ear: Secondary | ICD-10-CM | POA: Diagnosis not present

## 2021-05-31 DIAGNOSIS — E119 Type 2 diabetes mellitus without complications: Secondary | ICD-10-CM

## 2021-05-31 MED ORDER — CETIRIZINE HCL 5 MG PO TABS: 5.0000 mg | ORAL_TABLET | Freq: Every day | ORAL | 0 refills | Status: DC

## 2021-05-31 MED ORDER — PSEUDOEPHEDRINE HCL 30 MG PO TABS: 30.0000 mg | ORAL_TABLET | Freq: Three times a day (TID) | ORAL | 0 refills | Status: DC | PRN

## 2021-05-31 MED ORDER — FLUTICASONE PROPIONATE 50 MCG/ACT NA SUSP: 2.0000 | Freq: Every day | NASAL | 12 refills | Status: DC

## 2021-05-31 NOTE — ED Triage Notes (Signed)
Pt sts chronic right ear problems x years with increased pain x 2 weeks

## 2021-05-31 NOTE — ED Provider Notes (Signed)
[facility]   MRN: [id number] DOB: 1941-07-08  Subjective:   Rachel [name] is a 80 y.o. female presenting for 2-week history of persistent recurrent right ear pain, right ear fullness.  Patient has a longstanding history of allergic rhinitis, eustachian tube dysfunction.  She would like to make sure that she does not have an ear infection.  She does have an ENT specialist but will be seeing them until the end of this month.  Has used ibuprofen with minimal relief.  Does not use her allergy medications consistently.  She is a type II diabetic treated without insulin.  No fever, tinnitus, drainage, hearing loss, chest pain.  No current facility-administered medications for this encounter.  Current Outpatient Medications:    albuterol (VENTOLIN HFA) 108 (90 Base) MCG/ACT inhaler, INHALE 1-2 PUFFS INTO THE LUNGS EVERY 6 (SIX) HOURS AS NEEDED FOR WHEEZING OR SHORTNESS OF BREATH., Disp: 18 g, Rfl: 1   amLODipine (NORVASC) 5 MG tablet, TAKE 1 TABLET (5 MG TOTAL) BY MOUTH DAILY., Disp: 30 tablet, Rfl: 0   baclofen (LIORESAL) 10 MG tablet, Take 0.5-1 tablets (5-10 mg total) by mouth 3 (three) times daily as needed for muscle spasms., Disp: 30 each, Rfl: 3   Blood Glucose Monitoring Suppl (ONETOUCH VERIO REFLECT) w/Device KIT, 1 each by Does not apply route daily. E11.9, Disp: , Rfl:    fluticasone (FLONASE) 50 MCG/ACT nasal spray, Place 1-2 sprays into both nostrils daily., Disp: 16 g, Rfl: 0   Glucose Blood (ONETOUCH VERIO VI), 1 each by Other route daily. E11.9, Disp: , Rfl:    glucose blood (ONETOUCH VERIO) test strip, Use to check blood sugars once a day, Disp: 100 strip, Rfl: 5   ibuprofen (ADVIL) 600 MG tablet, Take 1 tablet (600 mg total) by mouth 2 (two) times daily as needed for moderate pain (Use infrequently)., Disp: 60 tablet, Rfl: 0   ibuprofen (ADVIL) 800 MG tablet, Take 1 tablet (800 mg total) by mouth every 8 (eight) hours as needed., Disp: 30 tablet, Rfl: 0    loratadine (CLARITIN) 10 MG tablet, TAKE 1 TABLET BY MOUTH EVERY DAY, Disp: 30 tablet, Rfl: 13   losartan-hydrochlorothiazide (HYZAAR) 100-25 MG tablet, Take 1 tablet by mouth daily., Disp: 90 tablet, Rfl: 3   Multiple Vitamins-Minerals (HAIR SKIN AND NAILS FORMULA) TABS, Take 1 tablet by mouth daily., Disp: , Rfl:    nystatin (MYCOSTATIN/NYSTOP) powder, Apply topically 4 (four) times daily., Disp: 60 g, Rfl: 0   nystatin cream (MYCOSTATIN), Apply 1 application topically 2 (two) times daily., Disp: 30 g, Rfl: 1   OneTouch Delica Lancets 30G MISC, 1 each by Does not apply route daily. E11.9, Disp: , Rfl:    repaglinide (PRANDIN) 2 MG tablet, Take 1 tablet (2 mg total) by mouth 3 (three) times daily before meals., Disp: 90 tablet, Rfl: 2   Allergies  Allergen Reactions   Aspirin Nausea And Vomiting    Uncoated aspirin Jittery GI upset   Codeine Sulfate Nausea And Vomiting    Heart races.   Glimepiride Diarrhea   Metformin Diarrhea   Propoxyphene N-Acetaminophen Nausea And Vomiting    Past Medical History:  Diagnosis Date   Allergic rhinitis    Diabetes mellitus, type 2 (HCC)    Hyperlipemia    Hypertension    Lipoma NEC    anterior upper chest   Osteoarthritis of knee      Past Surgical History:  Procedure Laterality Date   ACNE CYST REMOVAL  1970  back   [place]   complete    Family History  Problem Relation Age of Onset   Cancer Father        brain   Diabetes Mother    Heart attack Mother    Diabetes Sister    Diabetes Brother    Hypertension Sister    Hypertension Brother    Aneurysm Other        niece   Coronary artery disease Brother     Social History   Tobacco Use   Smoking status: Never   Smokeless tobacco: Never  Vaping Use   Vaping Use: Never used  Substance Use Topics   Alcohol use: Yes    Alcohol/week: 1.0 standard drink    Types: 1 Glasses of wine per week    Comment:  ocassionally   Drug use: No    ROS   Objective:   Vitals: BP (!) 162/69 (BP Location: Left Arm)   Pulse 72   Temp 98.5 F (36.9 C) (Oral)   Resp 18   SpO2 95%   Physical Exam Constitutional:      General: She is not in acute distress.    Appearance: She is well-developed. She is not ill-appearing, toxic-appearing or diaphoretic.  HENT:     Head: Normocephalic and atraumatic.     Right Ear: Tympanic membrane, ear canal and external ear normal. No drainage or tenderness. No middle ear effusion. There is no impacted cerumen. Tympanic membrane is not erythematous.     Left Ear: Tympanic membrane, ear canal and external ear normal. No drainage or tenderness.  No middle ear effusion. There is no impacted cerumen. Tympanic membrane is not erythematous.     Nose: No congestion or rhinorrhea.     Mouth/Throat:     Mouth: Mucous membranes are moist. No oral lesions.     Pharynx: Oropharynx is clear. No pharyngeal swelling, oropharyngeal exudate, posterior oropharyngeal erythema or uvula swelling.     Tonsils: No tonsillar exudate or tonsillar abscesses.  Eyes:     General: No scleral icterus.       Right eye: No discharge.        Left eye: No discharge.     Extraocular Movements: Extraocular movements intact.     Right eye: Normal extraocular motion.     Left eye: Normal extraocular motion.     Conjunctiva/sclera: Conjunctivae normal.     Pupils: Pupils are equal, round, and reactive to light.  Cardiovascular:     Rate and Rhythm: Normal rate.  Pulmonary:     Effort: Pulmonary effort is normal.  Musculoskeletal:     Cervical back: Normal range of motion and neck supple.  Lymphadenopathy:     Cervical: No cervical adenopathy.  Skin:    General: Skin is warm and dry.  Neurological:     General: No focal deficit present.     Mental Status: She is alert and oriented to person, place, and time.     Cranial Nerves: No cranial nerve deficit.     Motor: No weakness.      Coordination: Coordination normal.     Gait: Gait normal.     Deep Tendon Reflexes: Reflexes normal.  Psychiatric:        Mood and Affect: Mood normal.        Behavior: Behavior normal.    Assessment and Plan :   PDMP not reviewed this encounter.  1. Eustachian tube dysfunction, right   2. Right ear pain   3. Type 2 diabetes mellitus treated without insulin ([place])     Unremarkable ENT exam.  Will use conservative management for what I suspect is recurrent eustachian tube dysfunction.  Recommended restarting Flonase and adding Zyrtec, Sudafed.  Maintain follow-up with ENT.  Counseled patient on potential for adverse effects with medications prescribed/recommended today, ER and return-to-clinic precautions discussed, patient verbalized understanding.    Jaynee Eagles, PA-C 05/31/21 1301

## 2021-06-08 ENCOUNTER — Ambulatory Visit (HOSPITAL_BASED_OUTPATIENT_CLINIC_OR_DEPARTMENT_OTHER): Payer: PPO | Admitting: Cardiovascular Disease

## 2021-06-14 ENCOUNTER — Ambulatory Visit (INDEPENDENT_AMBULATORY_CARE_PROVIDER_SITE_OTHER): Payer: PPO | Admitting: Family Medicine

## 2021-06-14 ENCOUNTER — Encounter: Payer: Self-pay | Admitting: Family Medicine

## 2021-06-14 ENCOUNTER — Other Ambulatory Visit: Payer: Self-pay

## 2021-06-14 VITALS — BP 157/87 | HR 76 | Temp 98.1°F | Resp 16 | Ht 63.0 in | Wt 198.0 lb

## 2021-06-14 DIAGNOSIS — H9201 Otalgia, right ear: Secondary | ICD-10-CM

## 2021-06-14 DIAGNOSIS — E1140 Type 2 diabetes mellitus with diabetic neuropathy, unspecified: Secondary | ICD-10-CM

## 2021-06-14 DIAGNOSIS — M542 Cervicalgia: Secondary | ICD-10-CM

## 2021-06-14 DIAGNOSIS — I10 Essential (primary) hypertension: Secondary | ICD-10-CM | POA: Diagnosis not present

## 2021-06-14 MED ORDER — IBUPROFEN 800 MG PO TABS: 800.0000 mg | ORAL_TABLET | Freq: Three times a day (TID) | ORAL | 1 refills | Status: DC | PRN

## 2021-06-14 MED ORDER — AMLODIPINE BESYLATE 10 MG PO TABS: 10.0000 mg | ORAL_TABLET | Freq: Every day | ORAL | 0 refills | Status: DC

## 2021-06-14 NOTE — Progress Notes (Signed)
Patient is here to see provider about ear pain. Per patient it keeps her awake in right ear 7/10.

## 2021-06-15 NOTE — Progress Notes (Signed)
Established Patient Office Visit  Subjective:  Patient ID: Rachel [name], female    DOB: 1940-10-27  Age: 80 y.o. MRN: [id number]  CC:  Chief Complaint  Patient presents with   Ear Pain    HPI Rachel [name] presents for follow-up of chronic medical issues.  These include diabetes and hypertension.  Patient also complains of right ear pain.  Denies fever chills or viral symptoms.  Denies known trauma or injury.  Patient reports symptoms for several weeks.  She reports that it feels like a fullness in her ear.  She does have Flonase at home which she utilizes.  Past Medical History:  Diagnosis Date   Allergic rhinitis    Diabetes mellitus, type 2 (HCC)    Hyperlipemia    Hypertension    Lipoma NEC    anterior upper chest   Osteoarthritis of knee     Past Surgical History:  Procedure Laterality Date   ACNE CYST REMOVAL  1970   back   APPENDECTOMY  1963   OOPHORECTOMY  1985   VAGINAL HYSTERECTOMY  1985   complete    Family History  Problem Relation Age of Onset   Cancer Father        brain   Diabetes Mother    Heart attack Mother    Diabetes Sister    Diabetes Brother    Hypertension Sister    Hypertension Brother    Aneurysm Other        niece   Coronary artery disease Brother     Social History   Socioeconomic History   Marital status: Widowed    Spouse name: Not on file   Number of children: Not on file   Years of education: Not on file   Highest education level: Not on file  Occupational History   Occupation: department mang    Employer: [employer]    Comment: Retired  Tobacco Use   Smoking status: Never   Smokeless tobacco: Never  Vaping Use   Vaping Use: Never used  Substance and Sexual Activity   Alcohol use: Yes    Alcohol/week: 1.0 standard drink    Types: 1 Glasses of wine per week    Comment: ocassionally   Drug use: No   Sexual activity: Not Currently  Other Topics Concern   Not on file  Social  History Narrative   No regular exercise   Widowed   Social Determinants of Health   Financial Resource Strain: Not on file  Food Insecurity: Not on file  Transportation Needs: Not on file  Physical Activity: Not on file  Stress: Not on file  Social Connections: Not on file  Intimate Partner Violence: Not on file    ROS Review of Systems  Objective:   Today's Vitals: BP (!) 157/87 (BP Location: Right Arm, Patient Position: Sitting, Cuff Size: Large)   Pulse 76   Temp 98.1 F (36.7 C) (Temporal)   Resp 16   Ht 5\' 3"  (1.6 m)   Wt 198 lb (89.8 kg)   SpO2 95%   BMI 35.07 kg/m   Physical Exam Vitals and nursing note reviewed.  Constitutional:      General: She is not in acute distress. HENT:     Right Ear: A middle ear effusion is present.     Left Ear: Tympanic membrane normal.  Cardiovascular:     Rate and Rhythm: Normal rate and regular rhythm.  Pulmonary:     Effort: Pulmonary effort  is normal.     Breath sounds: Normal breath sounds.  Abdominal:     Palpations: Abdomen is soft.     Tenderness: There is no abdominal tenderness.  Musculoskeletal:     Right lower leg: No edema.     Left lower leg: No edema.  Neurological:     General: No focal deficit present.     Mental Status: She is alert and oriented to person, place, and time.    Assessment & Plan:   1. Type 2 diabetes mellitus with diabetic neuropathy, without long-term current use of insulin (HCC) Discussed dietary and activity options.  Most recent A1c was elevated above goal.  Continue present management and will monitor.  Referral was made to [facility] for diabetic med management  2. Essential hypertension Patient with elevated reading.  Amlodipine was increased from 5 mg p.o. daily to 10 mg p.o. daily.  Will monitor  3. Otalgia of right ear Ibuprofen was refilled.  Patient deferred referral for further eval and management at this time.  She will continue to utilize Flonase and Zyrtec.   - ibuprofen  (ADVIL) 800 MG tablet; Take 1 tablet (800 mg total) by mouth every 8 (eight) hours as needed.  Dispense: 30 tablet; Refill: 1    Outpatient Encounter Medications as of 06/14/2021  Medication Sig   albuterol (VENTOLIN HFA) 108 (90 Base) MCG/ACT inhaler INHALE 1-2 PUFFS INTO THE LUNGS EVERY 6 (SIX) HOURS AS NEEDED FOR WHEEZING OR SHORTNESS OF BREATH.   amLODipine (NORVASC) 10 MG tablet Take 1 tablet (10 mg total) by mouth daily.   baclofen (LIORESAL) 10 MG tablet Take 0.5-1 tablets (5-10 mg total) by mouth 3 (three) times daily as needed for muscle spasms.   Blood Glucose Monitoring Suppl (ONETOUCH VERIO REFLECT) w/Device KIT 1 each by Does not apply route daily. E11.9   cetirizine (ZYRTEC) 5 MG tablet Take 1 tablet (5 mg total) by mouth daily.   fluticasone (FLONASE) 50 MCG/ACT nasal spray Place 2 sprays into both nostrils daily.   Glucose Blood (ONETOUCH VERIO VI) 1 each by Other route daily. E11.9   glucose blood (ONETOUCH VERIO) test strip Use to check blood sugars once a day   loratadine (CLARITIN) 10 MG tablet TAKE 1 TABLET BY MOUTH EVERY DAY   losartan-hydrochlorothiazide (HYZAAR) 100-25 MG tablet Take 1 tablet by mouth daily.   Multiple Vitamins-Minerals (HAIR SKIN AND NAILS FORMULA) TABS Take 1 tablet by mouth daily.   nystatin (MYCOSTATIN/NYSTOP) powder Apply topically 4 (four) times daily.   nystatin cream (MYCOSTATIN) Apply 1 application topically 2 (two) times daily.   OneTouch Delica Lancets 81E MISC 1 each by Does not apply route daily. E11.9   pseudoephedrine (SUDAFED) 30 MG tablet Take 1 tablet (30 mg total) by mouth every 8 (eight) hours as needed for congestion.   repaglinide (PRANDIN) 2 MG tablet Take 1 tablet (2 mg total) by mouth 3 (three) times daily before meals.   [DISCONTINUED] amLODipine (NORVASC) 5 MG tablet TAKE 1 TABLET (5 MG TOTAL) BY MOUTH DAILY.   [DISCONTINUED] ibuprofen (ADVIL) 600 MG tablet Take 1 tablet (600 mg total) by mouth 2 (two) times daily as needed for  moderate pain (Use infrequently).   [DISCONTINUED] ibuprofen (ADVIL) 800 MG tablet Take 1 tablet (800 mg total) by mouth every 8 (eight) hours as needed.   ibuprofen (ADVIL) 800 MG tablet Take 1 tablet (800 mg total) by mouth every 8 (eight) hours as needed.   No facility-administered encounter medications on file as  of 06/14/2021.    Follow-up: Return in about 4 weeks (around 07/12/2021) for follow up.   Becky Sax, MD

## 2021-06-22 DIAGNOSIS — H9201 Otalgia, right ear: Secondary | ICD-10-CM | POA: Diagnosis not present

## 2021-06-26 ENCOUNTER — Ambulatory Visit: Payer: PPO | Admitting: Pharmacist

## 2021-07-21 ENCOUNTER — Encounter: Payer: Self-pay | Admitting: Nurse Practitioner

## 2021-07-21 ENCOUNTER — Telehealth (INDEPENDENT_AMBULATORY_CARE_PROVIDER_SITE_OTHER): Payer: PPO | Admitting: Nurse Practitioner

## 2021-07-21 ENCOUNTER — Other Ambulatory Visit: Payer: Self-pay

## 2021-07-21 DIAGNOSIS — I10 Essential (primary) hypertension: Secondary | ICD-10-CM

## 2021-07-21 MED ORDER — LOSARTAN POTASSIUM-HCTZ 100-25 MG PO TABS
1.0000 | ORAL_TABLET | Freq: Every day | ORAL | 3 refills | Status: DC
Start: 2021-07-21 — End: 2021-11-01

## 2021-07-21 MED ORDER — AMLODIPINE BESYLATE 10 MG PO TABS: 10.0000 mg | ORAL_TABLET | Freq: Every day | ORAL | 0 refills | Status: DC

## 2021-07-21 NOTE — Patient Instructions (Addendum)
Essential hypertension: - Patient asymptomatic without chest pressure, chest pain, palpitations, shortness of breath, and worst headache of life.  - Continue Losartan-Hydrochlorothiazide 100-25 mg daily. Will refill. - Continue Amlodipine as prescribed. Will refill - Counseled on blood pressure goal of less than 130/80, low-sodium, DASH diet, medication compliance, 150 minutes of moderate intensity exercise per week as tolerated. Discussed medication compliance, adverse effects.  Follow up:  Follow up with PCP in 3-4 weeks

## 2021-07-21 NOTE — Progress Notes (Signed)
Virtual Visit via Telephone Note  I connected with Rachel [name] on 07/21/21 at  8:40 AM EDT by telephone and verified that I am speaking with the correct person using two identifiers.  Location: Patient: home Provider: office   I discussed the limitations, risks, security and privacy concerns of performing an evaluation and management service by telephone and the availability of in person appointments. I also discussed with the patient that there may be a patient responsible charge related to this service. The patient expressed understanding and agreed to proceed.   History of Present Illness:  Patient presents today for medication refill through televisit.  Patient is needing clarification on which blood pressure medications today.  It looks like at her last visit with Dr. Redmond Pulling her amlodipine was increased to 10 mg daily.  She is currently been taking 5 mg daily but is out of her medication at this time.  We will refill amlodipine and losartan-hydrochlorothiazide today.  We will schedule follow-up visit with Dr. Redmond Pulling within 3 to 4 weeks for blood pressure recheck.  Patient is not currently checking blood pressures at home. Denies f/c/s, n/v/d, hemoptysis, PND, chest pain or edema.     Observations/Objective:  Vitals with BMI 06/14/2021 05/31/2021 01/04/2021  Height 5\' 3"  - 5' 3.386"  Weight 198 lbs - 199 lbs  BMI 02.40 - 97.35  Systolic 329 924 268  Diastolic 87 69 78  Pulse 76 72 62      Assessment and Plan:  Essential hypertension: - Patient asymptomatic without chest pressure, chest pain, palpitations, shortness of breath, and worst headache of life.  - Continue Losartan-Hydrochlorothiazide 100-25 mg daily. Will refill. - Continue Amlodipine as prescribed. Will refill - Counseled on blood pressure goal of less than 130/80, low-sodium, DASH diet, medication compliance, 150 minutes of moderate intensity exercise per week as tolerated. Discussed medication  compliance, adverse effects.  Follow up:  Follow up with PCP in 3-4 weeks    I discussed the assessment and treatment plan with the patient. The patient was provided an opportunity to ask questions and all were answered. The patient agreed with the plan and demonstrated an understanding of the instructions.   The patient was advised to call back or seek an in-person evaluation if the symptoms worsen or if the condition fails to improve as anticipated.  I provided 23 minutes of non-face-to-face time during this encounter.   Fenton Foy, NP

## 2021-07-27 ENCOUNTER — Other Ambulatory Visit: Payer: Self-pay | Admitting: Internal Medicine

## 2021-07-31 ENCOUNTER — Other Ambulatory Visit: Payer: Self-pay | Admitting: Family

## 2021-07-31 DIAGNOSIS — E1140 Type 2 diabetes mellitus with diabetic neuropathy, unspecified: Secondary | ICD-10-CM

## 2021-08-02 ENCOUNTER — Inpatient Hospital Stay: Admission: RE | Admit: 2021-08-02 | Payer: PPO | Source: Ambulatory Visit

## 2021-08-03 ENCOUNTER — Ambulatory Visit (HOSPITAL_BASED_OUTPATIENT_CLINIC_OR_DEPARTMENT_OTHER): Payer: PPO | Admitting: Cardiovascular Disease

## 2021-08-06 NOTE — Progress Notes (Addendum)
Patient ID: Rachel [name], female    DOB: 01/07/1941  MRN: [id number]  CC: Diabetes Follow-Up  Subjective: Rachel [name] is a 80 y.o. female who presents for diabetes follow-up.   Her concerns today include:   DIABETES FOLLOW-UP: 06/14/2021 per MD note: Discussed dietary and activity options.  Most recent A1c was elevated above goal.  Continue present management and will monitor.  Referral was made to [facility] for diabetic med management  08/15/2021: Reports not taking Prandin as prescribed. Reports most days taking two tablets once daily. Home blood sugars 150's in the morning. Reports she does have a late night snack. Numbness and tingling of bilateral feet worse at bedtime. Reports toenails are thickened.   2. HYPERTENSION FOLLOW-UP: 06/14/2021 per MD note: Patient with elevated reading.  Amlodipine was increased from 5 mg p.o. daily to 10 mg p.o. daily.  Will monitor.  08/15/2021: Patient reports she is no longer taking Amlodipine because both the 5 mg and the 10 mg dose cause lower extremity swelling. Still taking Losartan-Hydrochlorothiazide. Home blood pressures 160's-180's/70's-80's. Denies chest pain, shortness of breath, and additional red flag symptoms. Taking Ibuprofen frequently because of neck pain, followed by Orthopedics.  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   Bacterial pharyngitis 08/25/2014   Acute bronchitis 07/03/2014   Oral thrush 07/03/2014   Onychomycosis 02/18/2013   URI (upper respiratory infection) 07/11/2012   Cough 07/11/2012   Ganglion cyst 06/25/2011   Foot pain, left 06/25/2011   Otitis media 06/25/2011   LIPOMA 11/03/2010   EUSTACHIAN TUBE DYSFUNCTION, RIGHT 02/18/2010   Asthma with acute exacerbation 11/02/2009   ONYCHOMYCOSIS, TOENAILS  05/18/2009   Dyslipidemia 09/15/2008   DM2 (diabetes mellitus, type 2) (HCC) 04/19/2007   Essential hypertension 04/19/2007   Allergic rhinitis 04/19/2007   OSTEOARTHRITIS 04/19/2007     Current Outpatient Medications on File Prior to Visit  Medication Sig Dispense Refill   albuterol (VENTOLIN HFA) 108 (90 Base) MCG/ACT inhaler INHALE 1-2 PUFFS INTO THE LUNGS EVERY 6 (SIX) HOURS AS NEEDED FOR WHEEZING OR SHORTNESS OF BREATH. 18 g 1   amLODipine (NORVASC) 10 MG tablet Take 1 tablet (10 mg total) by mouth daily. (Patient not taking: Reported on 08/11/2021) 90 tablet 0   baclofen (LIORESAL) 10 MG tablet Take 0.5-1 tablets (5-10 mg total) by mouth 3 (three) times daily as needed for muscle spasms. 30 each 3   Blood Glucose Monitoring Suppl (ONETOUCH VERIO REFLECT) w/Device KIT 1 each by Does not apply route daily. E11.9     cetirizine (ZYRTEC) 5 MG tablet Take 1 tablet (5 mg total) by mouth daily. 90 tablet 0   fluticasone (FLONASE) 50 MCG/ACT nasal spray Place 2 sprays into both nostrils daily. 16 g 12   Glucose Blood (ONETOUCH VERIO VI) 1 each by Other route daily. E11.9     glucose blood (ONETOUCH VERIO) test strip Use to check blood sugars once a day 100 strip 5   ibuprofen (ADVIL) 800 MG tablet Take 1 tablet (800 mg total) by mouth every 8 (eight) hours as needed. 30 tablet 1   loratadine (CLARITIN) 10 MG tablet TAKE 1 TABLET BY MOUTH EVERY DAY 30 tablet 13   losartan-hydrochlorothiazide (HYZAAR) 100-25 MG tablet Take 1 tablet by mouth daily. 90 tablet 3   Multiple Vitamins-Minerals (HAIR SKIN  AND NAILS FORMULA) TABS Take 1 tablet by mouth daily.     nystatin (MYCOSTATIN/NYSTOP) powder Apply topically 4 (four) times daily. 60 g 0   nystatin cream (MYCOSTATIN) Apply 1 application topically 2 (two) times daily. 30 g 1   OneTouch Delica Lancets 19J MISC 1 each by Does not apply route daily. E11.9     pseudoephedrine (SUDAFED) 30 MG tablet Take 1 tablet (30 mg total) by mouth every 8 (eight)  hours as needed for congestion. 30 tablet 0   No current facility-administered medications on file prior to visit.    Allergies  Allergen Reactions   Aspirin Nausea And Vomiting    Uncoated aspirin Jittery GI upset   Codeine Sulfate Nausea And Vomiting    Heart races.   Glimepiride Diarrhea   Metformin Diarrhea   Propoxyphene N-Acetaminophen Nausea And Vomiting    Social History   Socioeconomic History   Marital status: Widowed    Spouse name: Not on file   Number of children: Not on file   Years of education: Not on file   Highest education level: Not on file  Occupational History   Occupation: department mang    Employer: [employer]: Retired  Tobacco Use   Smoking status: Never   Smokeless tobacco: Never  Vaping Use   Vaping Use: Never used  Substance and Sexual Activity   Alcohol use: Yes    Alcohol/week: 1.0 standard drink    Types: 1 Glasses of wine per week    Comment: ocassionally   Drug use: No   Sexual activity: Not Currently  Other Topics Concern   Not on file  Social History Narrative   No regular exercise   Widowed   Social Determinants of Health   Financial Resource Strain: Not on file  Food Insecurity: Not on file  Transportation Needs: Not on file  Physical Activity: Not on file  Stress: Not on file  Social Connections: Not on file  Intimate Partner Violence: Not on file    Family History  Problem Relation Age of Onset   Cancer Father        brain   Diabetes Mother    Heart attack Mother    Diabetes Sister    Diabetes Brother    Hypertension Sister    Hypertension Brother    Aneurysm Other        niece   Coronary artery disease Brother     Past Surgical History:  Procedure Laterality Date   ACNE CYST REMOVAL  1970   back   [place]   complete    ROS: Review of Systems Negative except as stated above  PHYSICAL EXAM: BP (!) 155/79 (BP  Location: Left Arm, Patient Position: Sitting, Cuff Size: Large)   Pulse 70   Temp 97.8 F (36.6 C)   Resp 18   Ht 5' 2.99" (1.6 m)   Wt 196 lb (88.9 kg)   SpO2 97%   BMI 34.73 kg/m   Physical Exam HENT:     Head: Normocephalic and atraumatic.  Eyes:     Conjunctiva/sclera: Conjunctivae normal.     Pupils: Pupils are equal, round, and reactive to light.  Cardiovascular:     Rate and Rhythm: Normal rate and regular rhythm.     Pulses: Normal pulses.     Heart sounds: Normal heart sounds.  Pulmonary:     Effort: Pulmonary  effort is normal.     Breath sounds: Normal breath sounds.  Musculoskeletal:     Cervical back: Normal range of motion and neck supple.  Neurological:     General: No focal deficit present.     Mental Status: She is alert and oriented to person, place, and time.  Psychiatric:        Mood and Affect: Mood normal.        Behavior: Behavior normal.   Results for orders placed or performed in visit on 08/11/21  POCT glycosylated hemoglobin (Hb A1C)  Result Value Ref Range   Hemoglobin A1C 8.9 (A) 4.0 - 5.6 %   HbA1c POC (<> result, manual entry)     HbA1c, POC (prediabetic range)     HbA1c, POC (controlled diabetic range)      ASSESSMENT AND PLAN: 1. Type 2 diabetes mellitus with diabetic neuropathy, without long-term current use of insulin ([place]): - Hemoglobin A1c not at goal at 8.9%, goal < 8%. This is decreased from previous 10.1% on 11/22/2020. - Encouraged compliance with Repaglinide as prescribed.  - Begin Gabapentin as prescribed. - Intolerant of Glimepiride and Metformin.  - Discussed the importance of healthy eating habits, low-carbohydrate diet, low-sugar diet, regular aerobic exercise (at least 150 minutes a week as tolerated) and medication compliance to achieve or maintain control of diabetes. - Referral to Podiatry for diabetic foot exam. - Follow-up with primary provider in 4 weeks or sooner if needed.  - POCT glycosylated hemoglobin (Hb  A1C) - repaglinide (PRANDIN) 2 MG tablet; Take 1 tablet (2 mg total) by mouth 3 (three) times daily before meals.  Dispense: 90 tablet; Refill: 2 - gabapentin (NEURONTIN) 100 MG capsule; Take 1 capsule (100 mg total) by mouth at bedtime.  Dispense: 30 capsule; Refill: 0 - Ambulatory referral to Podiatry  2. Essential hypertension: - Blood pressure not at goal during today's visit. Patient asymptomatic without chest pressure, chest pain, palpitations, shortness of breath, worst headache of life, and any additional red flag symptoms. - Continue Losartan-Hydrochlorothiazide as prescribed. No refills needed.  - Intolerant to Amlodipine as this causes lower extremity swelling at both 5 mg and 10 mg doses, discontinued. - Counseled excessive use of Ibuprofen may elevate blood pressures. Recommendation to use Acetaminophen instead.  - Keep appointment schedule with Skeet Latch, MD on 08/21/2021 at Cardiology.  - Follow-up with primary provider as scheduled.   Patient was given the opportunity to ask questions.  Patient verbalized understanding of the plan and was able to repeat key elements of the plan. Patient was given clear instructions to go to Emergency Department or return to medical center if symptoms don't improve, worsen, or new problems develop.The patient verbalized understanding.   Orders Placed This Encounter  Procedures   Ambulatory referral to Podiatry   POCT glycosylated hemoglobin (Hb A1C)    Requested Prescriptions   Signed Prescriptions Disp Refills   repaglinide (PRANDIN) 2 MG tablet 90 tablet 2    Sig: Take 1 tablet (2 mg total) by mouth 3 (three) times daily before meals.   gabapentin (NEURONTIN) 100 MG capsule 30 capsule 0    Sig: Take 1 capsule (100 mg total) by mouth at bedtime.    Return in about 4 weeks (around 09/08/2021) for Follow-Up or next available diabetes .  Camillia Herter, NP

## 2021-08-11 ENCOUNTER — Encounter: Payer: PPO | Admitting: Family

## 2021-08-11 ENCOUNTER — Encounter: Payer: Self-pay | Admitting: Family

## 2021-08-11 ENCOUNTER — Other Ambulatory Visit: Payer: Self-pay

## 2021-08-11 VITALS — BP 155/79 | HR 70 | Temp 97.8°F | Resp 18 | Ht 62.99 in | Wt 196.0 lb

## 2021-08-11 DIAGNOSIS — I10 Essential (primary) hypertension: Secondary | ICD-10-CM

## 2021-08-11 DIAGNOSIS — E1140 Type 2 diabetes mellitus with diabetic neuropathy, unspecified: Secondary | ICD-10-CM

## 2021-08-11 LAB — POCT GLYCOSYLATED HEMOGLOBIN (HGB A1C): Hemoglobin A1C: 8.9 % — AB (ref 4.0–5.6)

## 2021-08-11 MED ORDER — GABAPENTIN 100 MG PO CAPS: 100.0000 mg | ORAL_CAPSULE | Freq: Three times a day (TID) | ORAL | 0 refills | Status: DC

## 2021-08-11 MED ORDER — REPAGLINIDE 2 MG PO TABS: 2.0000 mg | ORAL_TABLET | Freq: Three times a day (TID) | ORAL | 2 refills | Status: DC

## 2021-08-11 MED ORDER — GABAPENTIN 100 MG PO CAPS: 100.0000 mg | ORAL_CAPSULE | Freq: Every day | ORAL | 0 refills | Status: DC

## 2021-08-11 NOTE — Addendum Note (Signed)
Addended by: Camillia Herter on: 08/11/2021 07:15 PM   Modules accepted: Orders, Level of Service

## 2021-08-11 NOTE — Progress Notes (Signed)
Pt presents for hypertension follow-up, states she is not taking Amlodipine she has been swelling Pt desires A1c to be checked last checked 11/2020 10.1%

## 2021-08-11 NOTE — Progress Notes (Signed)
Diabetes discussed in office.

## 2021-08-11 NOTE — Addendum Note (Signed)
Addended by: Elmon Else on: 08/11/2021 03:18 PM   Modules accepted: Orders

## 2021-08-21 ENCOUNTER — Ambulatory Visit (HOSPITAL_BASED_OUTPATIENT_CLINIC_OR_DEPARTMENT_OTHER): Payer: PPO | Admitting: Cardiovascular Disease

## 2021-08-22 ENCOUNTER — Encounter (INDEPENDENT_AMBULATORY_CARE_PROVIDER_SITE_OTHER): Payer: Self-pay | Admitting: Podiatry

## 2021-08-22 NOTE — Progress Notes (Signed)
This encounter was created in error - please disregard.

## 2021-08-31 NOTE — Addendum Note (Signed)
Addended by: Gardiner Barefoot on: 08/31/2021 11:59 AM   Modules accepted: Level of Service

## 2021-09-03 NOTE — Progress Notes (Signed)
Erroneous encounter

## 2021-09-06 ENCOUNTER — Encounter: Payer: PPO | Admitting: Family

## 2021-09-06 DIAGNOSIS — I10 Essential (primary) hypertension: Secondary | ICD-10-CM

## 2021-09-11 ENCOUNTER — Other Ambulatory Visit: Payer: Self-pay

## 2021-09-11 NOTE — Patient Outreach (Signed)
Aging Gracefully Program  09/11/2021  Rachel [name] 18-Jan-1941 [id number]  [facility] Evaluation Interviewer attempted to call patient on today regarding Aging Gracefully final evaluation. No answer from patient after multiple rings. CMA left confidential voicemail for patient to return call.  Will attempt to call back within 1 week.   [place] Management Assistant [phone]

## 2021-09-29 ENCOUNTER — Other Ambulatory Visit: Payer: Self-pay

## 2021-09-29 ENCOUNTER — Ambulatory Visit: Payer: Self-pay | Admitting: Podiatry

## 2021-09-29 NOTE — Patient Outreach (Signed)
[facility]) Care Management  09/29/2021  Druscilla Petsch [facility] [birth year] [id number]  [facility] Evaluation Interviewer made second attempt to call patient on today regarding Aging Gracefully referral. No answer from patient after multiple rings. CMA could not leave confidential voicemail for patient to return call as mailbox is full.  Will attempt to call back within 1 week.   [place] Management Assistant [phone]

## 2021-10-05 ENCOUNTER — Ambulatory Visit (HOSPITAL_BASED_OUTPATIENT_CLINIC_OR_DEPARTMENT_OTHER): Payer: PPO | Admitting: Cardiovascular Disease

## 2021-10-11 ENCOUNTER — Other Ambulatory Visit: Payer: Self-pay

## 2021-10-11 NOTE — Patient Outreach (Signed)
Aging Gracefully Program  10/11/2021  [name] [birth year] [id number]  [facility] Evaluation Interviewer made final attempt to call patient on today regarding Aging Gracefully final evaluation. No answer from patient after multiple rings. CMA left confidential voicemail for patient to return call.  CMA will wait 24-48 hours for patient to return call then refer back to [facility] for follow up if needed.  [place] Management Assistant [phone]

## 2021-11-01 ENCOUNTER — Encounter (HOSPITAL_BASED_OUTPATIENT_CLINIC_OR_DEPARTMENT_OTHER): Payer: Self-pay | Admitting: Cardiovascular Disease

## 2021-11-01 ENCOUNTER — Other Ambulatory Visit: Payer: Self-pay

## 2021-11-01 ENCOUNTER — Ambulatory Visit (HOSPITAL_BASED_OUTPATIENT_CLINIC_OR_DEPARTMENT_OTHER): Payer: PPO | Admitting: Cardiovascular Disease

## 2021-11-01 DIAGNOSIS — R0602 Shortness of breath: Secondary | ICD-10-CM | POA: Insufficient documentation

## 2021-11-01 DIAGNOSIS — E1140 Type 2 diabetes mellitus with diabetic neuropathy, unspecified: Secondary | ICD-10-CM

## 2021-11-01 DIAGNOSIS — G8929 Other chronic pain: Secondary | ICD-10-CM

## 2021-11-01 DIAGNOSIS — I10 Essential (primary) hypertension: Secondary | ICD-10-CM

## 2021-11-01 DIAGNOSIS — M25512 Pain in left shoulder: Secondary | ICD-10-CM | POA: Diagnosis not present

## 2021-11-01 HISTORY — DX: Shortness of breath: R06.02

## 2021-11-01 HISTORY — DX: Pain in left shoulder: M25.512

## 2021-11-01 MED ORDER — VALSARTAN-HYDROCHLOROTHIAZIDE 320-25 MG PO TABS: 1.0000 | ORAL_TABLET | Freq: Every day | ORAL | 3 refills | Status: DC

## 2021-11-01 MED ORDER — DICLOFENAC SODIUM 1 % EX GEL: 2.0000 g | Freq: Four times a day (QID) | CUTANEOUS | 1 refills | Status: AC | PRN

## 2021-11-01 MED ORDER — SPIRONOLACTONE 25 MG PO TABS: 25.0000 mg | ORAL_TABLET | Freq: Every day | ORAL | 3 refills | Status: DC

## 2021-11-01 MED ORDER — AMLODIPINE BESYLATE 5 MG PO TABS: 5.0000 mg | ORAL_TABLET | Freq: Every day | ORAL | 3 refills | Status: DC

## 2021-11-01 NOTE — Assessment & Plan Note (Signed)
Recommended that she not take ibuprofen as this may affect her kidney function and her blood pressure.  She can take Tylenol up to 1000 mg 3 times a day.  Also recommended Voltaren gel topically.

## 2021-11-01 NOTE — Patient Instructions (Addendum)
Medication Instructions:  START VALSARTAN HCT 320/25 MG DAILY   STOP LOSARTAN HCT   START AMLODIPINE 5 MG DAILY   START SPIRONOLACTONE 25 MG DAILY   OK TO TAKE ACETAMINOPHEN (TYLENOL) 1000 MG THREE TIMES A DAY AS NEEDED   DICLOFENAC GEL 4 TIMES A DAY AS NEEDED    Labwork: FASTING LP/CMET/TSH/A1C IN 1 WEEK    Testing/Procedures: Your physician has requested that you have an echocardiogram. Echocardiography is a painless test that uses sound waves to create images of your heart. It provides your doctor with information about the size and shape of your heart and how well your hearts chambers and valves are working. This procedure takes approximately one hour. There are no restrictions for this procedure.   Your physician has requested that you have a renal artery duplex. During this test, an ultrasound is used to evaluate blood flow to the kidneys. Allow one hour for this exam. Do not eat after midnight the day before and avoid carbonated beverages. Take your medications as you usually do.   Follow-Up: 11/29/2021 10:30 am  WITH PHARM D AT [place] will receive a phone call from the PREP exercise and nutrition program to schedule an initial assessment.  Special Instructions:   MONITOR YOUR BLOOD PRESSURE TWICE A DAY, LOG IN THE BOOK PROVIDED. BRING THE BOOK AND YOUR BLOOD PRESSURE MACHINE TO YOUR FOLLOW UP IN 1 MONTH  DASH Eating Plan DASH stands for "Dietary Approaches to Stop Hypertension." The DASH eating plan is a healthy eating plan that has been shown to reduce high blood pressure (hypertension). It may also reduce your risk for type 2 diabetes, heart disease, and stroke. The DASH eating plan may also help with weight loss. What are tips for following this plan?  General guidelines Avoid eating more than 2,300 mg (milligrams) of salt (sodium) a day. If you have hypertension, you may need to reduce your sodium intake to 1,500 mg a day. Limit alcohol intake to no more  than 1 drink a day for nonpregnant women and 2 drinks a day for men. One drink equals 12 oz of beer, 5 oz of wine, or 1 oz of hard liquor. Work with your health care provider to maintain a healthy body weight or to lose weight. Ask what an ideal weight is for you. Get at least 30 minutes of exercise that causes your heart to beat faster (aerobic exercise) most days of the week. Activities may include walking, swimming, or biking. Work with your health care provider or diet and nutrition specialist (dietitian) to adjust your eating plan to your individual calorie needs. Reading food labels  Check food labels for the amount of sodium per serving. Choose foods with less than 5 percent of the Daily Value of sodium. Generally, foods with less than 300 mg of sodium per serving fit into this eating plan. To find whole grains, look for the word "whole" as the first word in the ingredient list. Shopping Buy products labeled as "low-sodium" or "no salt added." Buy fresh foods. Avoid canned foods and premade or frozen meals. Cooking Avoid adding salt when cooking. Use salt-free seasonings or herbs instead of table salt or sea salt. Check with your health care provider or pharmacist before using salt substitutes. Do not fry foods. Cook foods using healthy methods such as baking, boiling, grilling, and broiling instead. Cook with heart-healthy oils, such as olive, canola, soybean, or sunflower oil. Meal planning Eat a balanced diet that includes:  5 or more servings of fruits and vegetables each day. At each meal, try to fill half of your plate with fruits and vegetables. Up to 6-8 servings of whole grains each day. Less than 6 oz of lean meat, poultry, or fish each day. A 3-oz serving of meat is about the same size as a deck of cards. One egg equals 1 oz. 2 servings of low-fat dairy each day. A serving of nuts, seeds, or beans 5 times each week. Heart-healthy fats. Healthy fats called Omega-3 fatty acids  are found in foods such as flaxseeds and coldwater fish, like sardines, salmon, and mackerel. Limit how much you eat of the following: Canned or prepackaged foods. Food that is high in trans fat, such as fried foods. Food that is high in saturated fat, such as fatty meat. Sweets, desserts, sugary drinks, and other foods with added sugar. Full-fat dairy products. Do not salt foods before eating. Try to eat at least 2 vegetarian meals each week. Eat more home-cooked food and less restaurant, buffet, and fast food. When eating at a restaurant, ask that your food be prepared with less salt or no salt, if possible. What foods are recommended? The items listed may not be a complete list. Talk with your dietitian about what dietary choices are best for you. Grains Whole-grain or whole-wheat bread. Whole-grain or whole-wheat pasta. Brown rice. [place]. Bulgur. Whole-grain and low-sodium cereals. Pita bread. Low-fat, low-sodium crackers. Whole-wheat flour tortillas. Vegetables Fresh or frozen vegetables (raw, steamed, roasted, or grilled). Low-sodium or reduced-sodium tomato and vegetable juice. Low-sodium or reduced-sodium tomato sauce and tomato paste. Low-sodium or reduced-sodium canned vegetables. Fruits All fresh, dried, or frozen fruit. Canned fruit in natural juice (without added sugar). Meat and other protein foods Skinless chicken or [place]. Ground chicken or [place]. Pork with fat trimmed off. Fish and seafood. Egg whites. Dried beans, peas, or lentils. Unsalted nuts, nut butters, and seeds. Unsalted canned beans. Lean cuts of beef with fat trimmed off. Low-sodium, lean deli meat. Dairy Low-fat (1%) or fat-free (skim) milk. Fat-free, low-fat, or reduced-fat cheeses. Nonfat, low-sodium ricotta or cottage cheese. Low-fat or nonfat yogurt. Low-fat, low-sodium cheese. Fats and oils Soft margarine without trans fats. Vegetable oil. Low-fat, reduced-fat, or light mayonnaise and salad  dressings (reduced-sodium). Canola, safflower, olive, soybean, and sunflower oils. Avocado. Seasoning and other foods Herbs. Spices. Seasoning mixes without salt. Unsalted popcorn and pretzels. Fat-free sweets. What foods are not recommended? The items listed may not be a complete list. Talk with your dietitian about what dietary choices are best for you. Grains Baked goods made with fat, such as croissants, muffins, or some breads. Dry pasta or rice meal packs. Vegetables Creamed or fried vegetables. Vegetables in a cheese sauce. Regular canned vegetables (not low-sodium or reduced-sodium). Regular canned tomato sauce and paste (not low-sodium or reduced-sodium). Regular tomato and vegetable juice (not low-sodium or reduced-sodium). Angie Fava. Olives. Fruits Canned fruit in a light or heavy syrup. Fried fruit. Fruit in cream or butter sauce. Meat and other protein foods Fatty cuts of meat. Ribs. Fried meat. Berniece Salines. Sausage. Bologna and other processed lunch meats. Salami. Fatback. Hotdogs. Bratwurst. Salted nuts and seeds. Canned beans with added salt. Canned or smoked fish. Whole eggs or egg yolks. Chicken or [place] with skin. Dairy Whole or 2% milk, cream, and half-and-half. Whole or full-fat cream cheese. Whole-fat or sweetened yogurt. Full-fat cheese. Nondairy creamers. Whipped toppings. Processed cheese and cheese spreads. Fats and oils Butter. Stick margarine. Lard. Shortening. Ghee. Bacon fat.  Tropical oils, such as coconut, palm kernel, or palm oil. Seasoning and other foods Salted popcorn and pretzels. Onion salt, garlic salt, seasoned salt, table salt, and sea salt. Worcestershire sauce. Tartar sauce. Barbecue sauce. Teriyaki sauce. Soy sauce, including reduced-sodium. Steak sauce. Canned and packaged gravies. Fish sauce. Oyster sauce. Cocktail sauce. Horseradish that you find on the shelf. Ketchup. Mustard. Meat flavorings and tenderizers. Bouillon cubes. Hot sauce and Tabasco sauce.  Premade or packaged marinades. Premade or packaged taco seasonings. Relishes. Regular salad dressings. Where to find more information: National Heart, Lung, and [place]: https://wilson-eaton.com/ American Heart Association: www.heart.org Summary The DASH eating plan is a healthy eating plan that has been shown to reduce high blood pressure (hypertension). It may also reduce your risk for type 2 diabetes, heart disease, and stroke. With the DASH eating plan, you should limit salt (sodium) intake to 2,300 mg a day. If you have hypertension, you may need to reduce your sodium intake to 1,500 mg a day. When on the DASH eating plan, aim to eat more fresh fruits and vegetables, whole grains, lean proteins, low-fat dairy, and heart-healthy fats. Work with your health care provider or diet and nutrition specialist (dietitian) to adjust your eating plan to your individual calorie needs. This information is not intended to replace advice given to you by your health care provider. Make sure you discuss any questions you have with your health care provider. Document Released: 08/30/2011 Document Revised: 08/23/2017 Document Reviewed: 09/03/2016 Elsevier Patient Education  2020 [employer].

## 2021-11-01 NOTE — Progress Notes (Signed)
[facility] Initial Assessment:    Date:  11/01/2021   ID:  Rachel [name], DOB 1941/01/24, MRN [id number]  PCP:  Rachel Herter, NP  Cardiologist:  None  Nephrologist:  Referring MD: Rachel Herter, NP   CC: Hypertension  History of Present Illness:    Rachel [name] is a 81 y.o. female with a hx of hypertension, asthma, diabetes, here to establish care in the [facility]. She had a televisit 06/2021 and her blood pressure was 157/87 on amlodipine, losartan, and HCTZ. Amlodipine was increased to 10 mg. She previously had an Echo in 2019 which showed LVEF 65-70% and grade 1 diastolic dysfunction.  Today, she is currently suffering from chronic neck pain and left shoulder pain. For times her neck and shoulder pain are severe she would take ibuprofen, but she has not taken as much lately as she knows it can raise her BP. She has been diagnosed with hypertension for years. At home her blood pressure is typically 160-187. This is monitored once a day around breakfast prior to taking her medications. Sometimes she feels a little "flip" in her chest, but she denies any chest pains. Generally she does not feel as energetic as 10 years ago. For activity, she is working 4 hours a day for 5 days a week. She is constantly on her feet at work and will become fatigued on busy days. There is not much formal exercise, but she plans to work on this. She endorses LE edema that she attributes to her 5 mg amlodipine. Also she reports stopping a previous medication that had caused worse swelling. Due to her diabetes she is conscientious of her diet. Usually she eats breakfast at work, cooked at home. She has garden salads frequently, as well as beef and chicken. Pork and beef are rarer. She may drink small bottles of Ginger Ale occasionally. No alcohol consumption. She is taking a Bayer multivitamin. She is taking Claritin for seasonal allergies, but denies  taking sudafed. When lying on her back, she wakes up short of breath and catches herself snoring. She usually sleeps on her side to avoid this. She denies any lightheadedness, headaches, or syncope.  Previous antihypertensives: Amlodipine-increased edema at higher doses  Past Medical History:  Diagnosis Date   Allergic rhinitis    Diabetes mellitus, type 2 ([place])    Hyperlipemia    Hypertension    Left shoulder pain 11/01/2021   Lipoma NEC    anterior upper chest   Osteoarthritis of knee    Shortness of breath 11/01/2021    Past Surgical History:  Procedure Laterality Date   ACNE CYST REMOVAL  1970   back   [place]   complete    Current Medications: Current Meds  Medication Sig   acetaminophen (TYLENOL) 500 MG tablet Take 1,000 mg by mouth 3 (three) times daily as needed.   albuterol (VENTOLIN HFA) 108 (90 Base) MCG/ACT inhaler INHALE 1-2 PUFFS INTO THE LUNGS EVERY 6 (SIX) HOURS AS NEEDED FOR WHEEZING OR SHORTNESS OF BREATH.   amLODipine (NORVASC) 5 MG tablet Take 1 tablet (5 mg total) by mouth daily.   baclofen (LIORESAL) 10 MG tablet Take 0.5-1 tablets (5-10 mg total) by mouth 3 (three) times daily as needed for muscle spasms.   Blood Glucose Monitoring Suppl (ONETOUCH VERIO REFLECT) w/Device KIT 1 each by Does not apply route daily. E11.9   cetirizine (  ZYRTEC) 5 MG tablet Take 1 tablet (5 mg total) by mouth daily.   diclofenac Sodium (VOLTAREN) 1 % GEL Apply 2 g topically 4 (four) times daily as needed.   fluticasone (FLONASE) 50 MCG/ACT nasal spray Place 2 sprays into both nostrils daily.   Glucose Blood (ONETOUCH VERIO VI) 1 each by Other route daily. E11.9   glucose blood (ONETOUCH VERIO) test strip Use to check blood sugars once a day   ibuprofen (ADVIL) 800 MG tablet Take 1 tablet (800 mg total) by mouth every 8 (eight) hours as needed.   loratadine (CLARITIN) 10 MG tablet TAKE 1 TABLET BY MOUTH EVERY DAY    Multiple Vitamins-Minerals (HAIR SKIN AND NAILS FORMULA) TABS Take 1 tablet by mouth daily.   nystatin (MYCOSTATIN/NYSTOP) powder Apply topically 4 (four) times daily.   nystatin cream (MYCOSTATIN) Apply 1 application topically 2 (two) times daily.   OneTouch Delica Lancets 92E MISC 1 each by Does not apply route daily. E11.9   pseudoephedrine (SUDAFED) 30 MG tablet Take 1 tablet (30 mg total) by mouth every 8 (eight) hours as needed for congestion.   repaglinide (PRANDIN) 2 MG tablet Take 1 tablet (2 mg total) by mouth 3 (three) times daily before meals.   spironolactone (ALDACTONE) 25 MG tablet Take 1 tablet (25 mg total) by mouth daily.   valsartan-hydrochlorothiazide (DIOVAN-HCT) 320-25 MG tablet Take 1 tablet by mouth daily.   [DISCONTINUED] losartan-hydrochlorothiazide (HYZAAR) 100-25 MG tablet Take 1 tablet by mouth daily.     Allergies:   Aspirin, Codeine sulfate, Glimepiride, Metformin, and Propoxyphene n-acetaminophen   Social History   Socioeconomic History   Marital status: Widowed    Spouse name: Not on file   Number of children: Not on file   Years of education: Not on file   Highest education level: Not on file  Occupational History   Occupation: department mang    Employer: [employer]: Retired  Tobacco Use   Smoking status: Never   Smokeless tobacco: Never  Vaping Use   Vaping Use: Never used  Substance and Sexual Activity   Alcohol use: Yes    Alcohol/week: 1.0 standard drink    Types: 1 Glasses of wine per week    Comment: ocassionally   Drug use: No   Sexual activity: Not Currently  Other Topics Concern   Not on file  Social History Narrative   No regular exercise   Widowed   Social Determinants of Health   Financial Resource Strain: Low Risk    Difficulty of Paying Living Expenses: Not hard at all  Food Insecurity: No Food Insecurity   Worried About Charity fundraiser in the Last Year: Never true   Arboriculturist in the Last  Year: Never true  Transportation Needs: No Transportation Needs   Lack of Transportation (Medical): No   Lack of Transportation (Non-Medical): No  Physical Activity: Inactive   Days of Exercise per Week: 0 days   Minutes of Exercise per Session: 0 min  Stress: Not on file  Social Connections: Not on file     Family History: The patient's family history includes Aneurysm in an other family member; Cancer in her father; Coronary artery disease in her brother; Diabetes in her brother, mother, and sister; Heart attack in her mother; Hypertension in her brother and sister.  ROS:   Please see the history of present illness.    (+) Neck pain (+) Left shoulder pain (+) Palpitations (+)  LE edema (+) Fatigue (+) Seasonal allergies. (+) Snoring (+) Orthopnea All other systems reviewed and are negative.  EKGs/Labs/Other Studies Reviewed:    Echo 01/09/2018: Study Conclusions   - Left ventricle: Wall thickness was increased in a pattern of    moderate LVH. Systolic function was vigorous. The estimated    ejection fraction was in the range of 65% to 70%. Wall motion was    normal; there were no regional wall motion abnormalities. Doppler    parameters are consistent with abnormal left ventricular    relaxation (grade 1 diastolic dysfunction).  - Aortic valve: Peak gradient 18 mmHg across the aortic valve    likely represents LV outflow tract gradient rather than valvular    AS, the valve appears to open well. Peak gradient (S): 18 mm Hg.  - Mitral valve: There was mild regurgitation.  - Right ventricle: The cavity size was normal. Systolic function    was normal.  - Tricuspid valve: Peak RV-RA gradient (S): 37 mm Hg.  - Pulmonary arteries: PA peak pressure: 40 mm Hg (S).  - Inferior vena cava: The vessel was normal in size. The    respirophasic diameter changes were in the normal range (>= 50%),    consistent with normal central venous pressure.   Impressions:   - Normal LV size  with moderate LV hypertrophy. EF 65-70%, vigorous    systolic function. Normal RV size and systolic function. Mild    gradient across the LV outflow tract is likely due to    LVH/vigorous LV systolic function rather than valvular AS.   EKG:   11/01/2021: Sinus rhythm. Rate 66 bpm.  Recent Labs: No results found for requested labs within last 8760 hours.   Recent Lipid Panel    Component Value Date/Time   CHOL 222 (H) 01/12/2020 1350   TRIG 226.0 (H) 01/12/2020 1350   HDL 44.80 01/12/2020 1350   CHOLHDL 5 01/12/2020 1350   VLDL 45.2 (H) 01/12/2020 1350   LDLCALC 133 (H) 11/20/2018 1408   LDLDIRECT 139.0 01/12/2020 1350    Physical Exam:    VS:  BP (!) 192/74 (BP Location: Right Arm, Patient Position: Sitting, Cuff Size: Large)    Pulse 66    Ht 5' 2.99" (1.6 m)    Wt 202 lb 6.4 oz (91.8 kg)    BMI 35.86 kg/m  , BMI Body mass index is 35.86 kg/m. GENERAL:  Well appearing HEENT: Pupils equal round and reactive, fundi not visualized, oral mucosa unremarkable NECK: JVP 12 cm.  Waveform within normal limits, carotid upstroke brisk and symmetric, no bruits, no thyromegaly LUNGS:  Clear to auscultation bilaterally HEART:  RRR.  PMI not displaced or sustained,S1 and S2 within normal limits, no S3, no S4, no clicks, no rubs, no murmurs ABD:  Flat, positive bowel sounds normal in frequency in pitch, no bruits, no rebound, no guarding, no midline pulsatile mass, no hepatomegaly, no splenomegaly EXT:  2 plus pulses throughout, 1+ LE edema bilaterally, no cyanosis no clubbing SKIN:  No rashes no nodules NEURO:  Cranial nerves II through XII grossly intact, motor grossly intact throughout PSYCH:  Cognitively intact, oriented to person place and time   ASSESSMENT/PLAN:    Essential hypertension Blood pressure is poorly controlled.  She is active at work but doesn't get any exercise outside of work.  We are going to refer her to the PREP program to the [facility] and she is going to work on her 150  minutes of exercise  weekly.  She tolerated amlodipine at 5 mg but not 10 mg.  She is willing to start back to 5 mg daily.  Switch losartan/HCTZ to valsartan/HCTZ 320/25 mg daily.  Hopefully this she will improve efficacy.  We will add spironolactone 25 mg daily.  Check renal artery Dopplers to assess for renal artery stenosis.  She has no symptoms of pheochromocytoma.  Given that she is on an ARB we will not check for hyperaldosteronism at this time.  Consider sleep study if her echo is unremarkable.  Left shoulder pain Recommended that she not take ibuprofen as this may affect her kidney function and her blood pressure.  She can take Tylenol up to 1000 mg 3 times a day.  Also recommended Voltaren gel topically.  Shortness of breath Ms. Polanco has shortness of breath and orthopnea.  She has noted increasing shortness of breath going up steps.  She has no chest pain or pressure.  She also has lower extremity edema.  This may be somewhat attributable to amlodipine.  However her neck veins are mildly elevated.  We will get an echocardiogram to assess for heart failure.  DM2 (diabetes mellitus, type 2) (HCC) A1c has not been checked in a while.  Keep working on diet and exercise.  Check hemoglobin A1c when she returns for follow-up.  Check fasting lipids as well.   Screening for Secondary Hypertension:  Causes 11/01/2021  Drugs/Herbals Screened     - Comments Limits salt and caffeine.  No EtOH.  Rare NSAID use.  Renovascular HTN Screened     - Comments Check renal artery Dopplers  Sleep Apnea Screened     - Comments Snoring and orthopnea.  No other symptoms.  We will get an echo and if it is unremarkable consider screening for sleep study.  Thyroid Disease Screened     - Comments Check TSH  Hyperaldosteronism Not Screened     - Comments Currently on ARB.  Therefore will not start at this time.  Pheochromocytoma Screened     - Comments No symptoms  Cushing's Syndrome Not Screened   Hyperparathyroidism Screened  Coarctation of the Aorta Screened     - Comments BP symmetric  Compliance Screened    Relevant Labs/Studies: Basic Labs Latest Ref Rng & Units 01/12/2020 11/20/2018 11/13/2017  Sodium 135 - 145 mEq/L 133(L) 139 135  Potassium 3.5 - 5.1 mEq/L 4.6 4.5 4.3  Creatinine 0.40 - 1.20 mg/dL 0.98 0.76 0.79    Thyroid  Latest Ref Rng & Units 01/12/2020 11/20/2018  TSH 0.35 - 4.50 uIU/mL 1.84 1.73      Disposition:    FU with APP/PharmD in 1 month for the next 3 months.   FU with Vayla Wilhelmi C. Oval Linsey, MD, [facility] in 4 months.  Medication Adjustments/Labs and Tests Ordered: Current medicines are reviewed at length with the patient today.  Concerns regarding medicines are outlined above.   Orders Placed This Encounter  Procedures   TSH   Lipid panel   Comprehensive metabolic panel   HgB W2B   EKG 12-Lead   ECHOCARDIOGRAM COMPLETE   VAS US RENAL ARTERY DUPLEX   Meds ordered this encounter  Medications   spironolactone (ALDACTONE) 25 MG tablet    Sig: Take 1 tablet (25 mg total) by mouth daily.    Dispense:  90 tablet    Refill:  3   valsartan-hydrochlorothiazide (DIOVAN-HCT) 320-25 MG tablet    Sig: Take 1 tablet by mouth daily.    Dispense:  90 tablet  Refill:  3   diclofenac Sodium (VOLTAREN) 1 % GEL    Sig: Apply 2 g topically 4 (four) times daily as needed.    Dispense:  50 g    Refill:  1   amLODipine (NORVASC) 5 MG tablet    Sig: Take 1 tablet (5 mg total) by mouth daily.    Dispense:  90 tablet    Refill:  3   I,Mathew Stumpf,acting as a scribe for Skeet Latch, MD.,have documented all relevant documentation on the behalf of Skeet Latch, MD,as directed by  Skeet Latch, MD while in the presence of Skeet Latch, MD.  I, [place] Oval Linsey, MD have reviewed all documentation for this visit.  The documentation of the exam, diagnosis, procedures, and orders on 11/01/2021 are all accurate and complete.   Signed, Skeet Latch,  MD  11/01/2021 11:31 AM    [place]

## 2021-11-01 NOTE — Assessment & Plan Note (Signed)
A1c has not been checked in a while.  Keep working on diet and exercise.  Check hemoglobin A1c when she returns for follow-up.  Check fasting lipids as well.

## 2021-11-01 NOTE — Assessment & Plan Note (Signed)
Rachel [name] has shortness of breath and orthopnea.  She has noted increasing shortness of breath going up steps.  She has no chest pain or pressure.  She also has lower extremity edema.  This may be somewhat attributable to amlodipine.  However her neck veins are mildly elevated.  We will get an echocardiogram to assess for heart failure.

## 2021-11-01 NOTE — Assessment & Plan Note (Addendum)
Blood pressure is poorly controlled.  She is active at work but doesn't get any exercise outside of work.  We are going to refer her to the PREP program to the [facility] and she is going to work on her 150 minutes of exercise weekly.  She tolerated amlodipine at 5 mg but not 10 mg.  She is willing to start back to 5 mg daily.  Switch losartan/HCTZ to valsartan/HCTZ 320/25 mg daily.  Hopefully this she will improve efficacy.  We will add spironolactone 25 mg daily.  Check renal artery Dopplers to assess for renal artery stenosis.  She has no symptoms of pheochromocytoma.  Given that she is on an ARB we will not check for hyperaldosteronism at this time.  Consider sleep study if her echo is unremarkable.

## 2021-11-09 ENCOUNTER — Other Ambulatory Visit (HOSPITAL_BASED_OUTPATIENT_CLINIC_OR_DEPARTMENT_OTHER): Payer: PPO

## 2021-11-09 ENCOUNTER — Encounter (HOSPITAL_BASED_OUTPATIENT_CLINIC_OR_DEPARTMENT_OTHER): Payer: PPO

## 2021-11-21 ENCOUNTER — Telehealth: Payer: Self-pay | Admitting: Cardiovascular Disease

## 2021-11-21 NOTE — Telephone Encounter (Signed)
° °  Pt c/o medication issue:  1. Name of Medication:  amLODipine (NORVASC) 5 MG tablet spironolactone (ALDACTONE) 25 MG tablet    2. How are you currently taking this medication (dosage and times per day)? 1 tablet daily  3. Are you having a reaction (difficulty breathing--STAT)? no  4. What is your medication issue? Patient states her legs are swollen since starting the medications.  Pt c/o swelling: STAT is pt has developed SOB within 24 hours  If swelling, where is the swelling located? legs  How much weight have you gained and in what time span? Not sure  Have you gained 3 pounds in a day or 5 pounds in a week? no  Do you have a log of your daily weights (if so, list)? no  Are you currently taking a fluid pill? Not sure  Are you currently SOB? no  Have you traveled recently? no   Patient states her legs are very tight and throbbing from the swelling. She says it is from her feet to her knees.

## 2021-11-21 NOTE — Telephone Encounter (Signed)
RN attempted to return call, no answer, no VM    "Rachel [name] - Will you please reach out to patient to ensure she is following a low salt diet, elevating legs when sitting, and wearing compression stockings if she has them? Any recent BP or weight readings will be helpful. We will route that info to Dr. Oval Linsey for input.   She is a patient of [facility]. Will route to Dr. Oval Linsey for additional input.    Dr. Oval Linsey - Patient last seen 11/01/20. Previously intolerant to Amlodipine 10mg  but per her report has tolerated 5mg  previously. Amlodipine, Spironolactone added at that visit and Losartan/HCTZ transitioned to valsartan/HCYZ 320/25mg  QD.  She has upcoming visit with pharmacist 11/29/21 and renal duplex/echocardiogram 12/06/21.   Loel Dubonnet, NP"

## 2021-11-21 NOTE — Telephone Encounter (Signed)
Rachel [name] - Will you please reach out to patient to ensure she is following a low salt diet, elevating legs when sitting, and wearing compression stockings if she has them? Any recent BP or weight readings will be helpful. We will route that info to Dr. Oval Linsey for input.  She is a patient of [facility]. Will route to Dr. Oval Linsey for additional input.   Dr. Oval Linsey - Patient last seen 11/01/20. Previously intolerant to Amlodipine 10mg  but per her report has tolerated 5mg  previously. Amlodipine, Spironolactone added at that visit and Losartan/HCTZ transitioned to valsartan/HCYZ 320/25mg  QD.  She has upcoming visit with pharmacist 11/29/21 and renal duplex/echocardiogram 12/06/21.  Loel Dubonnet, NP

## 2021-11-22 ENCOUNTER — Encounter (HOSPITAL_BASED_OUTPATIENT_CLINIC_OR_DEPARTMENT_OTHER): Payer: PPO

## 2021-11-22 ENCOUNTER — Other Ambulatory Visit (HOSPITAL_BASED_OUTPATIENT_CLINIC_OR_DEPARTMENT_OTHER): Payer: PPO

## 2021-11-22 NOTE — Telephone Encounter (Signed)
Rn attempted to call patient for second time,unable to leave VM.  ? ?Wanted to discuss NP recommendations until Dr. Oval Linsey has a chance to review. It has already been routed to Dr. Blenda Mounts team for review!  ?

## 2021-11-23 MED ORDER — AMLODIPINE BESYLATE 2.5 MG PO TABS
2.5000 mg | ORAL_TABLET | Freq: Every day | ORAL | 3 refills | Status: DC
Start: 2021-11-23 — End: 2022-06-14

## 2021-11-23 NOTE — Telephone Encounter (Signed)
Per Dr. Oval Linsey, advise patient to reduce amlodipine to 2.5mg . RN called and updated the patient and new prescription sent ?

## 2021-11-23 NOTE — Addendum Note (Signed)
Addended by: Gerald Stabs on: 11/23/2021 02:45 PM ? ? Modules accepted: Orders ? ?

## 2021-11-29 ENCOUNTER — Ambulatory Visit: Payer: PPO

## 2021-11-29 ENCOUNTER — Other Ambulatory Visit (HOSPITAL_BASED_OUTPATIENT_CLINIC_OR_DEPARTMENT_OTHER): Payer: Self-pay | Admitting: Cardiovascular Disease

## 2021-11-29 NOTE — Progress Notes (Deleted)
Patient ID: Rachel [name]                 DOB: 08-01-1941                      MRN: [id number] ? ? ? ? ?HPI: ?Rachel [name] is a 81 y.o. female referred by Dr. Oval Linsey to HTN clinic. PMH is significant for ? ?Current HTN meds:  ?Amlodipine 2.47m daily ?Spironolactone 249mdaily ?Valsartan-HCTZ 320-2524maily ?Previously tried:  ?Intolerant to amlodipine 5 and 16m87m?BP goal: <130/80 ? ?Family History:  ? ?Social History:  ? ?Diet:  ? ?Exercise:  ? ?Home BP readings:  ? ?Wt Readings from Last 3 Encounters:  ?11/01/21 202 lb 6.4 oz (91.8 kg)  ?08/11/21 196 lb (88.9 kg)  ?06/14/21 198 lb (89.8 kg)  ? ?BP Readings from Last 3 Encounters:  ?11/01/21 (!) 192/74  ?08/11/21 (!) 155/79  ?06/14/21 (!) 157/87  ? ?Pulse Readings from Last 3 Encounters:  ?11/01/21 66  ?08/11/21 70  ?06/14/21 76  ? ? ?Renal function: ?CrCl cannot be calculated (Patient's most recent lab result is older than the maximum 21 days allowed.). ? ?Past Medical History:  ?Diagnosis Date  ? Allergic rhinitis   ? Diabetes mellitus, type 2 (HCC)[place]? Hyperlipemia   ? Hypertension   ? Left shoulder pain 11/01/2021  ? Lipoma NEC   ? anterior upper chest  ? Osteoarthritis of knee   ? Shortness of breath 11/01/2021  ? ? ?Current Outpatient Medications on File Prior to Visit  ?Medication Sig Dispense Refill  ? acetaminophen (TYLENOL) 500 MG tablet Take 1,000 mg by mouth 3 (three) times daily as needed.    ? albuterol (VENTOLIN HFA) 108 (90 Base) MCG/ACT inhaler INHALE 1-2 PUFFS INTO THE LUNGS EVERY 6 (SIX) HOURS AS NEEDED FOR WHEEZING OR SHORTNESS OF BREATH. 18 g 1  ? amLODipine (NORVASC) 2.5 MG tablet Take 1 tablet (2.5 mg total) by mouth daily. 90 tablet 3  ? baclofen (LIORESAL) 10 MG tablet Take 0.5-1 tablets (5-10 mg total) by mouth 3 (three) times daily as needed for muscle spasms. 30 each 3  ? Blood Glucose Monitoring Suppl (ONETOUCH VERIO REFLECT) w/Device KIT 1 each by Does not apply route daily. E11.9    ? cetirizine (ZYRTEC) 5 MG  tablet Take 1 tablet (5 mg total) by mouth daily. 90 tablet 0  ? diclofenac Sodium (VOLTAREN) 1 % GEL Apply 2 g topically 4 (four) times daily as needed. 50 g 1  ? fluticasone (FLONASE) 50 MCG/ACT nasal spray Place 2 sprays into both nostrils daily. 16 g 12  ? gabapentin (NEURONTIN) 100 MG capsule Take 1 capsule (100 mg total) by mouth at bedtime. 30 capsule 0  ? Glucose Blood (ONETOUCH VERIO VI) 1 each by Other route daily. E11.9    ? glucose blood (ONETOUCH VERIO) test strip Use to check blood sugars once a day 100 strip 5  ? ibuprofen (ADVIL) 800 MG tablet Take 1 tablet (800 mg total) by mouth every 8 (eight) hours as needed. 30 tablet 1  ? loratadine (CLARITIN) 10 MG tablet TAKE 1 TABLET BY MOUTH EVERY DAY 30 tablet 13  ? Multiple Vitamins-Minerals (HAIR SKIN AND NAILS FORMULA) TABS Take 1 tablet by mouth daily.    ? nystatin (MYCOSTATIN/NYSTOP) powder Apply topically 4 (four) times daily. 60 g 0  ? nystatin cream (MYCOSTATIN) Apply 1 application topically 2 (two) times daily. 30 g 1  ? OneTouch Delica  Lancets 30G MISC 1 each by Does not apply route daily. E11.9    ? pseudoephedrine (SUDAFED) 30 MG tablet Take 1 tablet (30 mg total) by mouth every 8 (eight) hours as needed for congestion. 30 tablet 0  ? repaglinide (PRANDIN) 2 MG tablet Take 1 tablet (2 mg total) by mouth 3 (three) times daily before meals. 90 tablet 2  ? spironolactone (ALDACTONE) 25 MG tablet Take 1 tablet (25 mg total) by mouth daily. 90 tablet 3  ? valsartan-hydrochlorothiazide (DIOVAN-HCT) 320-25 MG tablet Take 1 tablet by mouth daily. 90 tablet 3  ? ?No current facility-administered medications on file prior to visit.  ? ? ?Allergies  ?Allergen Reactions  ? Aspirin Nausea And Vomiting  ?  Uncoated aspirin ?Jittery ?GI upset  ? Codeine Sulfate Nausea And Vomiting  ?  Heart races.  ? Glimepiride Diarrhea  ? Metformin Diarrhea  ? Propoxyphene N-Acetaminophen Nausea And Vomiting  ? ? ? ?Assessment/Plan: ? ?1. Hypertension -   ?

## 2021-12-06 ENCOUNTER — Other Ambulatory Visit (HOSPITAL_BASED_OUTPATIENT_CLINIC_OR_DEPARTMENT_OTHER): Payer: PPO

## 2021-12-06 ENCOUNTER — Encounter (HOSPITAL_BASED_OUTPATIENT_CLINIC_OR_DEPARTMENT_OTHER): Payer: PPO

## 2021-12-21 ENCOUNTER — Telehealth: Payer: Self-pay

## 2021-12-21 ENCOUNTER — Ambulatory Visit: Payer: PPO

## 2021-12-21 NOTE — Telephone Encounter (Signed)
Lmom to r/s missed appt  

## 2021-12-21 NOTE — Progress Notes (Deleted)
? ? ? ?12/21/2021 ?Rachel [name] ?[birth year] ?[id number] ? ? ?HPI:  Rachel [name] is a 81 y.o. female patient of Dr Oval Linsey, with a [place] below who presents today for hypertension clinic evaluation.   She was last seen by Dr. Oval Linsey about two months ago, at which time her pressure was noted to be 192/74.   Patient admitted to suffering from chronic neck and left shoulder pain.   She thought thought that home BP readings were usually 983-382 systolic, checked most days around breakfast time.  At that visit Dr. Oval Linsey restarted amlodipine 5 mg,  switched losartan hctz to valsartan hctz and added spironolactone 25 mg.  A few weeks later patient called to report LEE and the amlodipine was decreased to 2.5 mg daily.   ? ?She returns today for follow up.  ? ?Past Medical History: ?hyperlipidemia 4/21 LDL 139, currently not on medication  ?DM2 11/22 A1c 8.9, on repaglinide 2 mg tid  ?Chronic pain Neck and left shoulder; treats with ibuprofen  ?SOB Echo ordered to assess for CHF  ?  ? ?Blood Pressure Goal:  130/80 ? ?Current Medications: amlodipine 2.5 mg qd, valsartan hctz 320/25 qd, spironolactone 25 mg qd ? ?Family Hx: ? ?Social Hx:  ? ?Diet:  ? ?Exercise:  ? ?Home BP readings:  ? ?Intolerances:  ? ?Labs:  ? ? ?Wt Readings from Last 3 Encounters:  ?11/01/21 202 lb 6.4 oz (91.8 kg)  ?08/11/21 196 lb (88.9 kg)  ?06/14/21 198 lb (89.8 kg)  ? ?BP Readings from Last 3 Encounters:  ?11/01/21 (!) 192/74  ?08/11/21 (!) 155/79  ?06/14/21 (!) 157/87  ? ?Pulse Readings from Last 3 Encounters:  ?11/01/21 66  ?08/11/21 70  ?06/14/21 76  ? ? ?Current Outpatient Medications  ?Medication Sig Dispense Refill  ? acetaminophen (TYLENOL) 500 MG tablet Take 1,000 mg by mouth 3 (three) times daily as needed.    ? albuterol (VENTOLIN HFA) 108 (90 Base) MCG/ACT inhaler INHALE 1-2 PUFFS INTO THE LUNGS EVERY 6 (SIX) HOURS AS NEEDED FOR WHEEZING OR SHORTNESS OF BREATH. 18 g 1  ? amLODipine (NORVASC) 2.5 MG tablet Take 1  tablet (2.5 mg total) by mouth daily. 90 tablet 3  ? baclofen (LIORESAL) 10 MG tablet Take 0.5-1 tablets (5-10 mg total) by mouth 3 (three) times daily as needed for muscle spasms. 30 each 3  ? Blood Glucose Monitoring Suppl (ONETOUCH VERIO REFLECT) w/Device KIT 1 each by Does not apply route daily. E11.9    ? cetirizine (ZYRTEC) 5 MG tablet Take 1 tablet (5 mg total) by mouth daily. 90 tablet 0  ? diclofenac Sodium (VOLTAREN) 1 % GEL Apply 2 g topically 4 (four) times daily as needed. 50 g 1  ? fluticasone (FLONASE) 50 MCG/ACT nasal spray Place 2 sprays into both nostrils daily. 16 g 12  ? gabapentin (NEURONTIN) 100 MG capsule Take 1 capsule (100 mg total) by mouth at bedtime. 30 capsule 0  ? Glucose Blood (ONETOUCH VERIO VI) 1 each by Other route daily. E11.9    ? glucose blood (ONETOUCH VERIO) test strip Use to check blood sugars once a day 100 strip 5  ? ibuprofen (ADVIL) 800 MG tablet Take 1 tablet (800 mg total) by mouth every 8 (eight) hours as needed. 30 tablet 1  ? loratadine (CLARITIN) 10 MG tablet TAKE 1 TABLET BY MOUTH EVERY DAY 30 tablet 13  ? Multiple Vitamins-Minerals (HAIR SKIN AND NAILS FORMULA) TABS Take 1 tablet by mouth daily.    ?  nystatin (MYCOSTATIN/NYSTOP) powder Apply topically 4 (four) times daily. 60 g 0  ? nystatin cream (MYCOSTATIN) Apply 1 application topically 2 (two) times daily. 30 g 1  ? OneTouch Delica Lancets 40N MISC 1 each by Does not apply route daily. E11.9    ? pseudoephedrine (SUDAFED) 30 MG tablet Take 1 tablet (30 mg total) by mouth every 8 (eight) hours as needed for congestion. 30 tablet 0  ? repaglinide (PRANDIN) 2 MG tablet Take 1 tablet (2 mg total) by mouth 3 (three) times daily before meals. 90 tablet 2  ? spironolactone (ALDACTONE) 25 MG tablet Take 1 tablet (25 mg total) by mouth daily. 90 tablet 3  ? valsartan-hydrochlorothiazide (DIOVAN-HCT) 320-25 MG tablet Take 1 tablet by mouth daily. 90 tablet 3  ? ?No current facility-administered medications for this  visit.  ? ? ?Allergies  ?Allergen Reactions  ? Aspirin Nausea And Vomiting  ?  Uncoated aspirin ?Jittery ?GI upset  ? Codeine Sulfate Nausea And Vomiting  ?  Heart races.  ? Glimepiride Diarrhea  ? Metformin Diarrhea  ? Propoxyphene N-Acetaminophen Nausea And Vomiting  ? ? ?Past Medical History:  ?Diagnosis Date  ? Allergic rhinitis   ? Diabetes mellitus, type 2 ([place])   ? Hyperlipemia   ? Hypertension   ? Left shoulder pain 11/01/2021  ? Lipoma NEC   ? anterior upper chest  ? Osteoarthritis of knee   ? Shortness of breath 11/01/2021  ? ? ?There were no vitals taken for this visit. ? ?No problem-specific Assessment & Plan notes found for this encounter. ? ? ?Tommy Medal PharmD CPP [facility] ?[place] [address] ?[place], [place] [address] ?[phone] ?

## 2022-01-03 ENCOUNTER — Telehealth: Payer: Self-pay | Admitting: Student-PharmD

## 2022-01-03 NOTE — Telephone Encounter (Signed)
Patient appearing on report for [facility] - Hypertension Control report due to last documented ambulatory blood pressure of 192/74 on 11/01/21 visit with Dr. Oval Linsey. She was scheduled with the [facility]ist team at [facility] on 12/21/21 but no showed appt and has not rescheduled. Next appointment with PCP is not yet scheduled. Per last cardiology note, patient was also supposed to have fasting labs checked after last visit which have not been completed.  ? ?Outreached patient to discuss hypertension control and medication management and to try to reschedule visit with [facility] pharmacist.  ? ?Current antihypertensives: amlodipine 2.5 mg daily, valsartan-HCTZ 320-25 mg daily, spironolactone 25 mg daily ? ?Unable to reach patient and unable to leave message due to VM being full.  ? ?Rebbeca Paul, PharmD ?PGY2 Ambulatory Care Pharmacy Resident ?01/03/2022 3:33 PM ? ?

## 2022-01-05 ENCOUNTER — Telehealth (HOSPITAL_BASED_OUTPATIENT_CLINIC_OR_DEPARTMENT_OTHER): Payer: Self-pay | Admitting: Cardiovascular Disease

## 2022-01-05 NOTE — Telephone Encounter (Signed)
2nd attempt to reach patient. Unable to LVM.  ?

## 2022-01-05 NOTE — Telephone Encounter (Signed)
Called to discuss rescheduling the Renal doppler and Echocardiogram that were ordered by Dr. Oval Linsey ?

## 2022-01-06 IMAGING — DX DG CHEST 2V
2 series · 2 of 2 positions shown · non-contrast
Comparison: 11/13/2017

CLINICAL DATA: Cough and shortness of breath

EXAM:
CHEST - 2 VIEW

[chest pa]
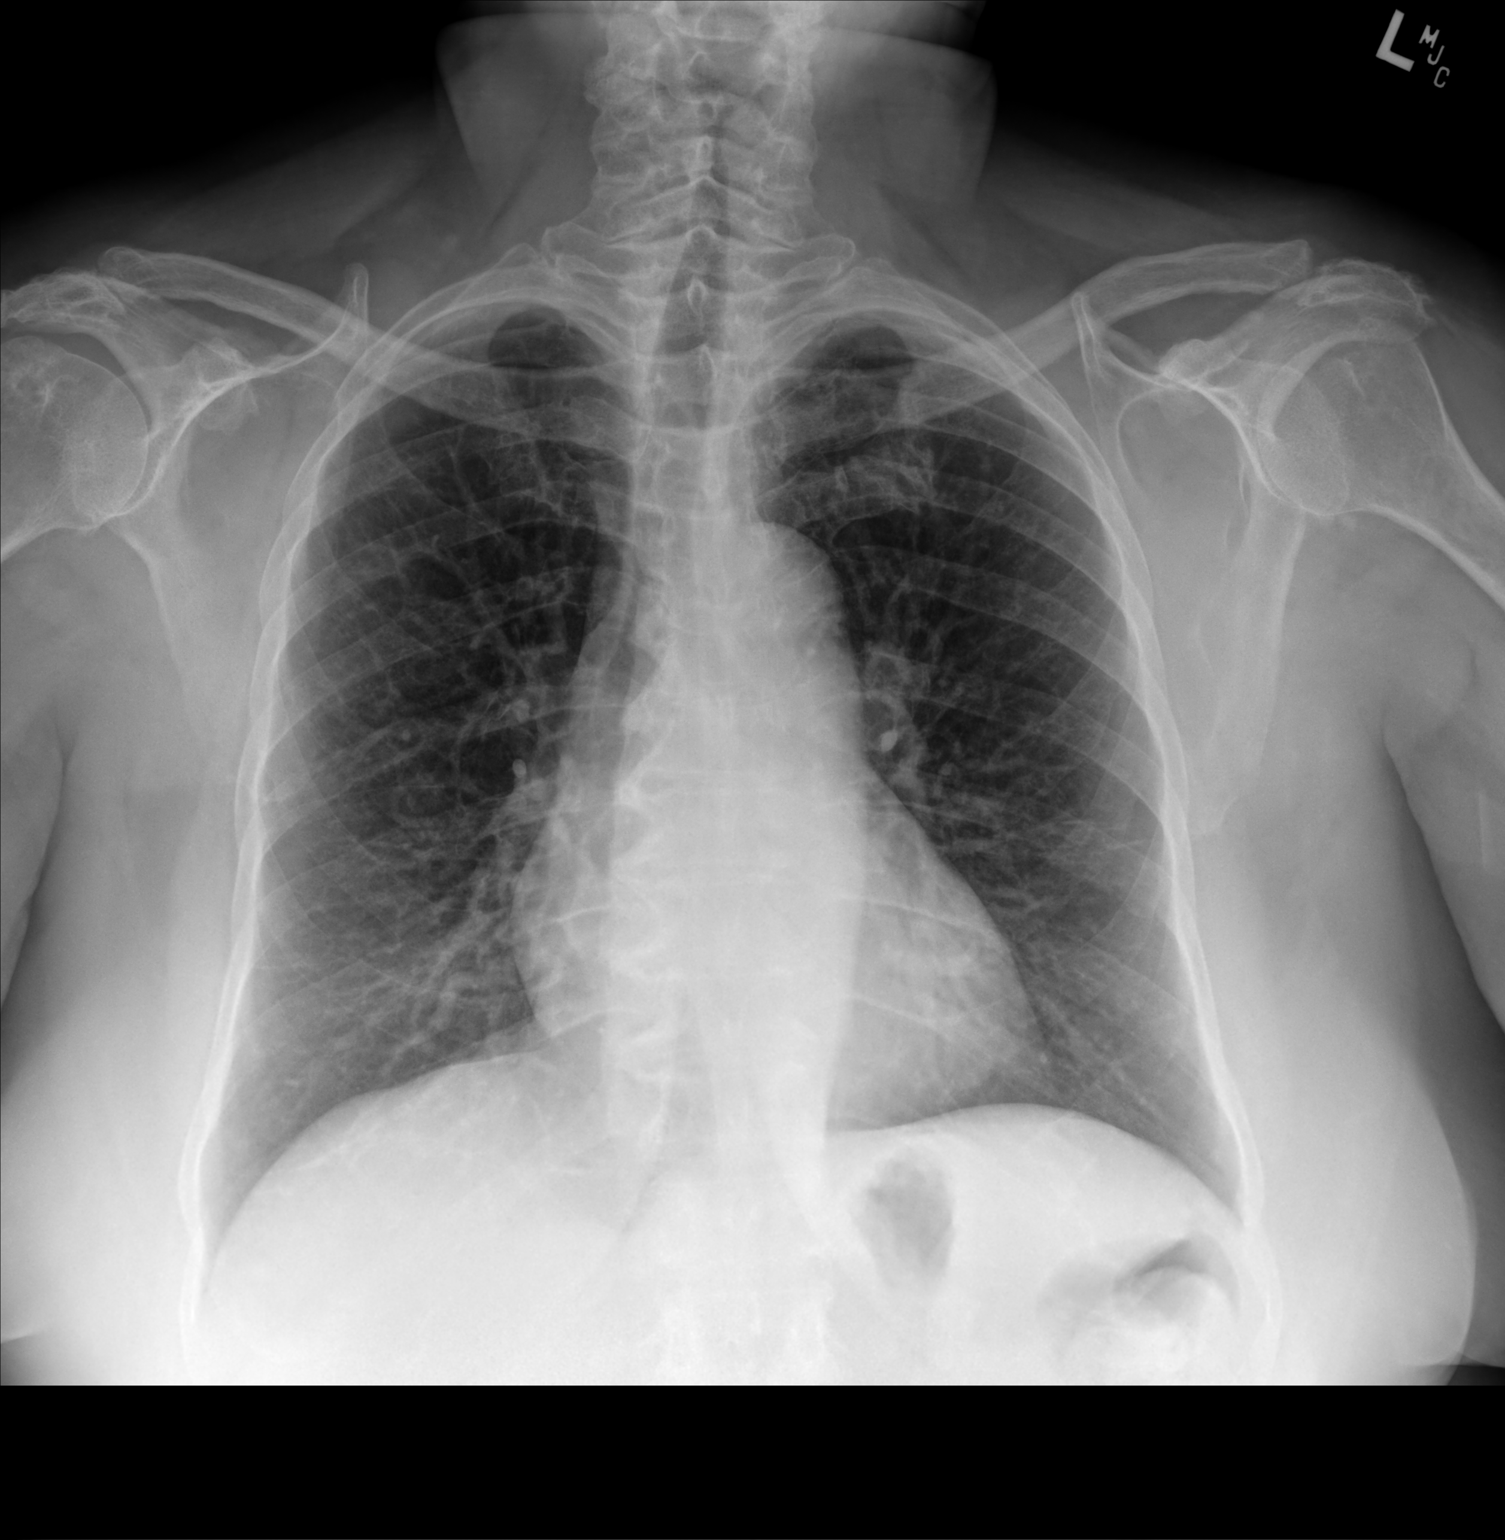

[chest lat]
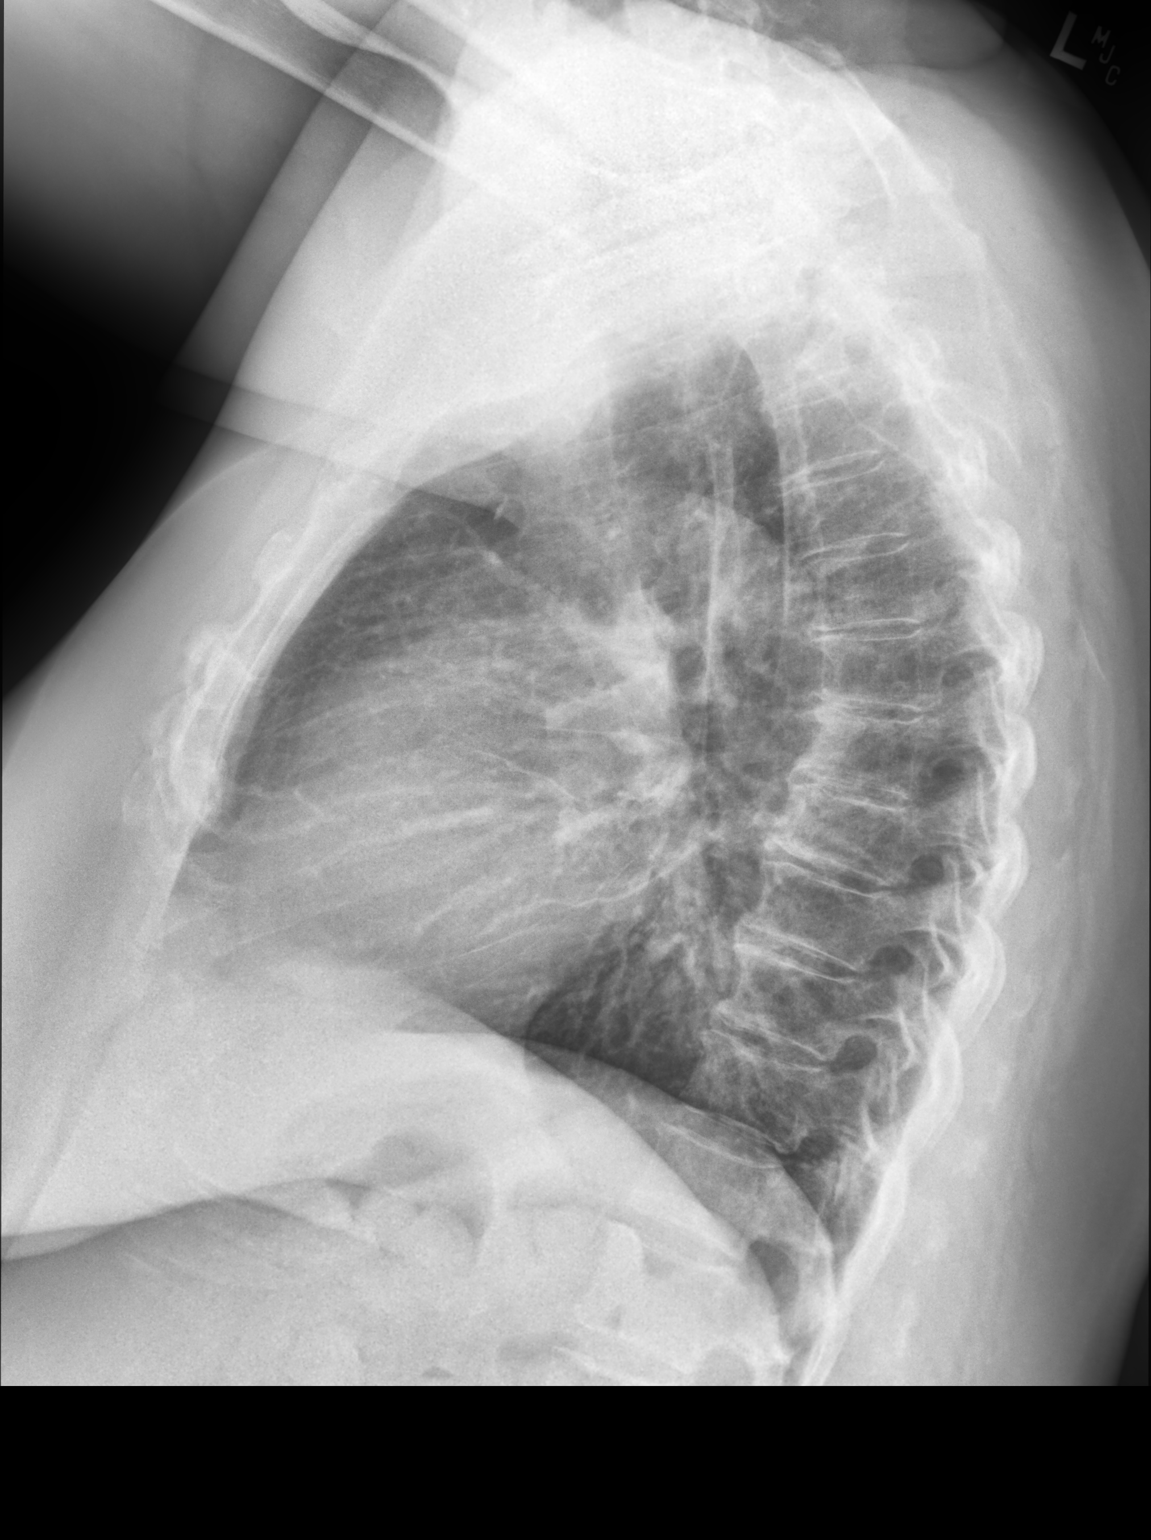

[2 of 2 positions shown; findings below may reference images not displayed]

FINDINGS: Normal heart size and mediastinal contours. There is no edema,
consolidation, effusion, or pneumothorax. Thoracic spondylosis
IMPRESSION: No evidence of acute disease.

## 2022-01-10 NOTE — Telephone Encounter (Signed)
3rd/last attempt to reach patient. Unable to LVM.  ?

## 2022-01-11 ENCOUNTER — Encounter (HOSPITAL_BASED_OUTPATIENT_CLINIC_OR_DEPARTMENT_OTHER): Payer: Self-pay | Admitting: Cardiovascular Disease

## 2022-01-11 NOTE — Telephone Encounter (Signed)
Called to discuss rescheduling the Echocardiogram and Renal doppler ordered by Dr. Jorge Mandril answer and mail box is full---will mail letter requesting patient call. ?

## 2022-01-15 ENCOUNTER — Other Ambulatory Visit: Payer: Self-pay | Admitting: Internal Medicine

## 2022-02-01 ENCOUNTER — Other Ambulatory Visit: Payer: Self-pay | Admitting: Internal Medicine

## 2022-02-13 ENCOUNTER — Telehealth (HOSPITAL_BASED_OUTPATIENT_CLINIC_OR_DEPARTMENT_OTHER): Payer: Self-pay | Admitting: *Deleted

## 2022-02-13 NOTE — Telephone Encounter (Signed)
-----   Message from Roland Earl sent at 02/12/2022 10:51 AM EDT ----- Regarding: Echocardiogram and Renal Doppler Good morning. Just a FYI----the above patient cancelled the Echo and renal doppler appointments on 11/09/21 and 11/22/21 and was a No Show on 12/06/21---multiple attempts have been made to speak with the patient to reschedule--including mailing a letter (01/11/22) requesting she call us to reschedule.  We have had no response.

## 2022-06-13 ENCOUNTER — Ambulatory Visit: Payer: Self-pay

## 2022-06-13 NOTE — Telephone Encounter (Signed)
Spoke w/patient and scheduled her for 06-14-22 at [facility]

## 2022-06-13 NOTE — Telephone Encounter (Signed)
Pt requesting a rx for cough   Pt states she has had " 2 coughing spells" and clear nasal drainage this morning   Pt states she has taken her inhaler   Please fu w/ pt   Left message to call back about symptoms.

## 2022-06-13 NOTE — Telephone Encounter (Signed)
     Chief Complaint: Productive cough Symptoms: Severe coughing spells that "make me SOB." No availability for appointment. Asking for medication to be sent to pharmacy. Frequency: Last night Pertinent Negatives: Patient denies fever, no wheezing Disposition: '[]'$ ED /'[]'$ Urgent Care (no appt availability in office) / '[]'$ Appointment(In office/virtual)/ '[]'$  [facility]/ '[]'$ Home Care/ '[]'$ Refused Recommended Disposition /'[]'$ [facility]/ '[x]'$  Follow-up with PCP Additional Notes: Please advise pt.  Answer Assessment - Initial Assessment Questions 1. ONSET: "When did the cough begin?"      Today 2. SEVERITY: "How bad is the cough today?"      Severe 3. SPUTUM: "Describe the color of your sputum" (none, dry cough; clear, white, yellow, green)     Clear 4. HEMOPTYSIS: "Are you coughing up any blood?" If so ask: "How much?" (flecks, streaks, tablespoons, etc.)     No 5. DIFFICULTY BREATHING: "Are you having difficulty breathing?" If Yes, ask: "How bad is it?" (e.g., mild, moderate, severe)    - MILD: No SOB at rest, mild SOB with walking, speaks normally in sentences, can lie down, no retractions, pulse < 100.    - MODERATE: SOB at rest, SOB with minimal exertion and prefers to sit, cannot lie down flat, speaks in phrases, mild retractions, audible wheezing, pulse 100-120.    - SEVERE: Very SOB at rest, speaks in single words, struggling to breathe, sitting hunched forward, retractions, pulse > 120      Mild 6. FEVER: "Do you have a fever?" If Yes, ask: "What is your temperature, how was it measured, and when did it start?"     No 7. CARDIAC HISTORY: "Do you have any history of heart disease?" (e.g., heart attack, congestive heart failure)      No 8. LUNG HISTORY: "Do you have any history of lung disease?"  (e.g., pulmonary embolus, asthma, emphysema)     Allergies 9. PE RISK FACTORS: "Do you have a history of blood clots?" (or: recent major surgery, recent prolonged travel,  bedridden)     No 10. OTHER SYMPTOMS: "Do you have any other symptoms?" (e.g., runny nose, wheezing, chest pain)       No 11. PREGNANCY: "Is there any chance you are pregnant?" "When was your last menstrual period?"       No 12. TRAVEL: "Have you traveled out of the country in the last month?" (e.g., travel history, exposures)       No  Protocols used: Cough - Acute Productive-A-AH

## 2022-06-14 ENCOUNTER — Ambulatory Visit (INDEPENDENT_AMBULATORY_CARE_PROVIDER_SITE_OTHER): Payer: PPO | Admitting: Physician Assistant

## 2022-06-14 ENCOUNTER — Encounter: Payer: Self-pay | Admitting: Physician Assistant

## 2022-06-14 VITALS — BP 192/80 | HR 71 | Resp 16 | Wt 198.0 lb

## 2022-06-14 DIAGNOSIS — R051 Acute cough: Secondary | ICD-10-CM | POA: Diagnosis not present

## 2022-06-14 DIAGNOSIS — Z91199 Patient's noncompliance with other medical treatment and regimen due to unspecified reason: Secondary | ICD-10-CM | POA: Diagnosis not present

## 2022-06-14 DIAGNOSIS — R040 Epistaxis: Secondary | ICD-10-CM | POA: Diagnosis not present

## 2022-06-14 DIAGNOSIS — H9201 Otalgia, right ear: Secondary | ICD-10-CM | POA: Diagnosis not present

## 2022-06-14 DIAGNOSIS — J0100 Acute maxillary sinusitis, unspecified: Secondary | ICD-10-CM | POA: Diagnosis not present

## 2022-06-14 DIAGNOSIS — J4531 Mild persistent asthma with (acute) exacerbation: Secondary | ICD-10-CM | POA: Diagnosis not present

## 2022-06-14 DIAGNOSIS — E1140 Type 2 diabetes mellitus with diabetic neuropathy, unspecified: Secondary | ICD-10-CM

## 2022-06-14 DIAGNOSIS — I10 Essential (primary) hypertension: Secondary | ICD-10-CM

## 2022-06-14 LAB — GLUCOSE, POCT (MANUAL RESULT ENTRY): POC Glucose: 161 mg/dl — AB (ref 70–99)

## 2022-06-14 MED ORDER — AMLODIPINE BESYLATE 2.5 MG PO TABS: 2.5000 mg | ORAL_TABLET | Freq: Every day | ORAL | 3 refills | Status: DC

## 2022-06-14 MED ORDER — ALBUTEROL SULFATE HFA 108 (90 BASE) MCG/ACT IN AERS: 1.0000 | INHALATION_SPRAY | Freq: Four times a day (QID) | RESPIRATORY_TRACT | 1 refills | Status: DC | PRN

## 2022-06-14 MED ORDER — LORATADINE 10 MG PO TABS: 10.0000 mg | ORAL_TABLET | Freq: Every day | ORAL | 1 refills | Status: DC

## 2022-06-14 MED ORDER — REPAGLINIDE 2 MG PO TABS: 2.0000 mg | ORAL_TABLET | Freq: Three times a day (TID) | ORAL | 2 refills | Status: DC

## 2022-06-14 MED ORDER — FLUTICASONE PROPIONATE 50 MCG/ACT NA SUSP: 2.0000 | Freq: Every day | NASAL | 12 refills | Status: DC

## 2022-06-14 MED ORDER — VALSARTAN-HYDROCHLOROTHIAZIDE 320-25 MG PO TABS: 1.0000 | ORAL_TABLET | Freq: Every day | ORAL | 3 refills | Status: DC

## 2022-06-14 MED ORDER — SPIRONOLACTONE 25 MG PO TABS: 25.0000 mg | ORAL_TABLET | Freq: Every day | ORAL | 3 refills | Status: DC

## 2022-06-14 MED ORDER — AMOXICILLIN 500 MG PO CAPS: 500.0000 mg | ORAL_CAPSULE | Freq: Three times a day (TID) | ORAL | 0 refills | Status: AC

## 2022-06-14 MED ORDER — BENZONATATE 100 MG PO CAPS: 100.0000 mg | ORAL_CAPSULE | Freq: Two times a day (BID) | ORAL | 0 refills | Status: DC | PRN

## 2022-06-14 NOTE — Patient Instructions (Signed)
Use saline nasal spray in your nose to help the area that bled.  Nosebleed, Adult A nosebleed is when blood comes out of the nose. Nosebleeds are common and can be caused by many things. They are usually not a sign of a serious medical problem. Follow these instructions at home: When you have a nosebleed:  Sit down. Tilt your head forward a little. Follow these steps: Pinch your nose with a clean towel or tissue. Keep pinching your nose for 5 minutes. Do not let go. After 5 minutes, let go of your nose. Keep doing these steps until the bleeding stops. Do not put tissues or other things in your nose to stop the bleeding. Avoid lying down or putting your head back. Use a nose spray decongestant as told by your doctor. After a nosebleed: Try not to blow your nose or sniffle for several hours. Try not to strain, lift, or bend at the waist for several days. Aspirin and medicines that thin your blood make bleeding more likely. If you take these medicines: Ask your doctor if you should stop taking them or if you should change how much you take. Do not stop taking the medicine unless your doctor tells you to. If your nosebleed was caused by dryness, use over-the-counter saline nasal spray or gel and a humidifier as told by your doctor. This will keep the inside of your nose moist and allow it to heal. If you need to use nasal spray or gel: Choose one that is water-soluble. Use only as much as you need and use it only as often as needed. Do not lie down right away after you use it. If you get nosebleeds often, talk with your doctor about treatments. These may include: Nasal cautery. A chemical swab or electrical device is used to lightly burn tiny blood vessels inside the nose. This helps stop or prevent nosebleeds. Nasal packing. A gauze or other material is placed in the nose to keep constant pressure on the bleeding area. Contact a doctor if: You have a fever. You get nosebleeds often. You  get nosebleeds more often than usual. You bruise very easily. You have something stuck in your nose. You are bleeding in your mouth. You vomit or cough up brown material. You get a nosebleed after you start a new medicine. Get help right away if: You have a nosebleed after you fall or hurt your head. Your nosebleed does not go away after 20 minutes. You feel dizzy or weak. You have unusual bleeding from other parts of your body. You have unusual bruising on other parts of your body. You get sweaty. You vomit blood. Summary Nosebleeds are common. They are usually not a sign of a serious medical problem. When you have a nosebleed, sit down and tilt your head a little forward. Pinch your nose with a clean tissue for 5 minutes. Use saline spray or saline gel and a humidifier as told by your doctor. Get help right away if your nosebleed does not go away after 20 minutes. This information is not intended to replace advice given to you by your health care provider. Make sure you discuss any questions you have with your health care provider. Document Revised: 09/19/2021 Document Reviewed: 09/19/2021 Elsevier Patient Education  [place].

## 2022-06-14 NOTE — Progress Notes (Signed)
Patient c/o nose bleed to right nose. Patient has cough has been present for a few days. Patient use her inhaler without relief.

## 2022-06-14 NOTE — Progress Notes (Signed)
Patient ID: Rachel [name], female   DOB: Nov 07, 1940, 81 y.o.   MRN: [id number]   Rachel [name], is a 81 y.o. female  YNW:[id number]  MVH:[id number]  DOB - 12/21/1940  Chief Complaint  Patient presents with   Cough       Subjective:   Rachel [name] is a 81 y.o. female here today for cough and congestion with R ear pain.  She had a mild nose bleed from her R nostril this morning.  Slow drip but would stop with pressure.  No fever.  No body aches.  R ear pain.  +sinus pressure and some discolored mucus.    She is not taking any of her meds.  Doesn't check blood sugars or blood pressures at home.  She admits to not taking good care of herself but wants to get back on track.  She has not been keeping f/up appts.  Last A1c=8.9 07/2021.    No problems updated.  ALLERGIES: Allergies  Allergen Reactions   Aspirin Nausea And Vomiting    Uncoated aspirin Jittery GI upset   Codeine Sulfate Nausea And Vomiting    Heart races.   Glimepiride Diarrhea   Metformin Diarrhea   Propoxyphene N-Acetaminophen Nausea And Vomiting    PAST MEDICAL HISTORY: Past Medical History:  Diagnosis Date   Allergic rhinitis    Diabetes mellitus, type 2 ([place])    Hyperlipemia    Hypertension    Left shoulder pain 11/01/2021   Lipoma NEC    anterior upper chest   Osteoarthritis of knee    Shortness of breath 11/01/2021    MEDICATIONS AT HOME: Prior to Admission medications   Medication Sig Start Date End Date Taking? Authorizing Provider  acetaminophen (TYLENOL) 500 MG tablet Take 1,000 mg by mouth 3 (three) times daily as needed.   Yes [provider]  amoxicillin (AMOXIL) 500 MG capsule Take 1 capsule (500 mg total) by mouth 3 (three) times daily for 10 days. 06/14/22 06/24/22 Yes Natividad Schlosser, Dionne Bucy, PA-C  baclofen (LIORESAL) 10 MG tablet Take 0.5-1 tablets (5-10 mg total) by mouth 3 (three) times daily as needed for muscle spasms. 01/12/21  Yes Hilts, Legrand Como, MD  benzonatate  (TESSALON) 100 MG capsule Take 1 capsule (100 mg total) by mouth 2 (two) times daily as needed for cough. 06/14/22  Yes Tracina Beaumont M, PA-C  Blood Glucose Monitoring Suppl (ONETOUCH VERIO REFLECT) w/Device KIT 1 each by Does not apply route daily. E11.9   Yes [provider]  cetirizine (ZYRTEC) 5 MG tablet Take 1 tablet (5 mg total) by mouth daily. 05/31/21  Yes Jaynee Eagles, PA-C  diclofenac Sodium (VOLTAREN) 1 % GEL Apply 2 g topically 4 (four) times daily as needed. 11/01/21  Yes Skeet Latch, MD  Glucose Blood (ONETOUCH VERIO VI) 1 each by Other route daily. E11.9   Yes [provider]  glucose blood (ONETOUCH VERIO) test strip Use to check blood sugars once a day 09/30/20  Yes Plotnikov, Evie Lacks, MD  ibuprofen (ADVIL) 800 MG tablet Take 1 tablet (800 mg total) by mouth every 8 (eight) hours as needed. 06/14/21  Yes Dorna Mai, MD  Multiple Vitamins-Minerals (HAIR SKIN AND NAILS FORMULA) TABS Take 1 tablet by mouth daily.   Yes [provider]  nystatin (MYCOSTATIN/NYSTOP) powder Apply topically 4 (four) times daily. 03/17/19  Yes Burns, Claudina Lick, MD  nystatin cream (MYCOSTATIN) Apply 1 application topically 2 (two) times daily. 03/17/19  Yes Burns, Claudina Lick, MD  OneTouch Delica Lancets  30G MISC 1 each by Does not apply route daily. E11.9   Yes [provider]  pseudoephedrine (SUDAFED) 30 MG tablet Take 1 tablet (30 mg total) by mouth every 8 (eight) hours as needed for congestion. 05/31/21  Yes Jaynee Eagles, PA-C  albuterol (VENTOLIN HFA) 108 (90 Base) MCG/ACT inhaler Inhale 1-2 puffs into the lungs every 6 (six) hours as needed for wheezing or shortness of breath. 06/14/22   Argentina Donovan, PA-C  amLODipine (NORVASC) 2.5 MG tablet Take 1 tablet (2.5 mg total) by mouth daily. 06/14/22 09/12/22  Argentina Donovan, PA-C  fluticasone (FLONASE) 50 MCG/ACT nasal spray Place 2 sprays into both nostrils daily. 06/14/22   Argentina Donovan, PA-C  gabapentin  (NEURONTIN) 100 MG capsule Take 1 capsule (100 mg total) by mouth at bedtime. 08/11/21 09/10/21  Camillia Herter, NP  loratadine (CLARITIN) 10 MG tablet Take 1 tablet (10 mg total) by mouth daily. 06/14/22   Argentina Donovan, PA-C  repaglinide (PRANDIN) 2 MG tablet Take 1 tablet (2 mg total) by mouth 3 (three) times daily before meals. 06/14/22   Argentina Donovan, PA-C  spironolactone (ALDACTONE) 25 MG tablet Take 1 tablet (25 mg total) by mouth daily. 06/14/22   Argentina Donovan, PA-C  valsartan-hydrochlorothiazide (DIOVAN-HCT) 320-25 MG tablet Take 1 tablet by mouth daily. 06/14/22   Argentina Donovan, PA-C    ROS: Neg cardiac Neg GI Neg GU Neg MS Neg psych Neg neuro  Objective:   Vitals:   06/14/22 0936 06/14/22 0941  BP: (!) 194/80 (!) 192/80  Pulse: 76 71  Resp: 16   SpO2: 93%   Weight: 198 lb (89.8 kg)    Exam General appearance : Awake, alert, not in any distress. Speech Clear. Not toxic looking HEENT: Atraumatic and Normocephalic.  TM on R bulging and slightly erythematous.  L TM WNL.  R nostril with small scab just inside.   Neck: Supple, no JVD. No cervical lymphadenopathy.  Chest: Good air entry bilaterally, CTAB.  No rales/rhonchi.  Mild wheezing CVS: S1 S2 regular, no murmurs.  Extremities: B/L Lower Ext shows no edema, both legs are warm to touch Neurology: Awake alert, and oriented X 3, CN II-XII intact, Non focal Skin: No Rash  Data Review Lab Results  Component Value Date   HGBA1C 8.9 (A) 08/11/2021   HGBA1C 10.1 (A) 11/22/2020   HGBA1C 10.6 (A) 03/31/2020    Assessment & Plan   1. Type 2 diabetes mellitus with diabetic neuropathy, without long-term current use of insulin (HCC) Compliance imperative.  Check blood sugars fasting and bedtime and record and bring to next visit - Glucose (CBG) - repaglinide (PRANDIN) 2 MG tablet; Take 1 tablet (2 mg total) by mouth 3 (three) times daily before meals.  Dispense: 90 tablet; Refill: 2 - Comprehensive  metabolic panel - CBC with Differential/Platelet - Lipid panel - Hemoglobin A1c  2. Mild persistent asthma with acute exacerbation - albuterol (VENTOLIN HFA) 108 (90 Base) MCG/ACT inhaler; Inhale 1-2 puffs into the lungs every 6 (six) hours as needed for wheezing or shortness of breath.  Dispense: 18 g; Refill: 1  3. Noncompliance Compliance imperative.  Discussed at length  4. Essential hypertension Uncontrolled-not taking meds.   - amLODipine (NORVASC) 2.5 MG tablet; Take 1 tablet (2.5 mg total) by mouth daily.  Dispense: 45 tablet; Refill: 3 - spironolactone (ALDACTONE) 25 MG tablet; Take 1 tablet (25 mg total) by mouth daily.  Dispense: 90 tablet; Refill: 3 - valsartan-hydrochlorothiazide (DIOVAN-HCT)  320-25 MG tablet; Take 1 tablet by mouth daily.  Dispense: 90 tablet; Refill: 3  5. Otalgia of right ear Resume flonase  6. Subacute maxillary sinusitis - amoxicillin (AMOXIL) 500 MG capsule; Take 1 capsule (500 mg total) by mouth 3 (three) times daily for 10 days.  Dispense: 30 capsule; Refill: 0 - fluticasone (FLONASE) 50 MCG/ACT nasal spray; Place 2 sprays into both nostrils daily.  Dispense: 16 g; Refill: 12 - loratadine (CLARITIN) 10 MG tablet; Take 1 tablet (10 mg total) by mouth daily.  Dispense: 90 tablet; Refill: 1  7. Acute cough - benzonatate (TESSALON) 100 MG capsule; Take 1 capsule (100 mg total) by mouth 2 (two) times daily as needed for cough.  Dispense: 20 capsule; Refill: 0  8. Epistaxis Saline nasal spray    Return in about 4 weeks (around 07/12/2022) for luke in 1 mth Bp and DM and PCP in 3 months.  The patient was given clear instructions to go to ER or return to medical center if symptoms don't improve, worsen or new problems develop. The patient verbalized understanding. The patient was told to call to get lab results if they haven't heard anything in the next week.      Freeman Caldron, PA-C [facility],  [place]   06/14/2022, 10:04 AM

## 2022-06-15 ENCOUNTER — Other Ambulatory Visit: Payer: Self-pay | Admitting: *Deleted

## 2022-06-15 MED ORDER — ONETOUCH DELICA LANCETS 30G MISC: 3 refills | Status: DC

## 2022-06-15 MED ORDER — ONETOUCH VERIO VI STRP: ORAL_STRIP | 5 refills | Status: DC

## 2022-06-19 ENCOUNTER — Encounter: Payer: Self-pay | Admitting: *Deleted

## 2022-06-19 NOTE — Progress Notes (Signed)
[facility] Quality Team Note  Name: Rachel [name] Date of Birth: 09-21-1941 MRN: [id number] Date: 06/19/2022  [facility] Quality Team has reviewed this patient's chart, please see recommendations below:  [facility] Quality Other; (Pt has open gap for AWV. )

## 2022-07-08 ENCOUNTER — Other Ambulatory Visit: Payer: Self-pay | Admitting: Physician Assistant

## 2022-07-08 DIAGNOSIS — R051 Acute cough: Secondary | ICD-10-CM

## 2022-07-09 NOTE — Telephone Encounter (Signed)
Order complete. 

## 2022-07-16 ENCOUNTER — Ambulatory Visit: Payer: PPO | Admitting: Pharmacist

## 2022-08-06 ENCOUNTER — Ambulatory Visit: Payer: Self-pay | Admitting: *Deleted

## 2022-08-06 NOTE — Telephone Encounter (Signed)
  Pt would like further assistance.  ----- Message from Nani Ravens sent at 08/06/2022 12:57 PM EST ----- Pt called in for assistance. Pt says that she is experiencing a cough. Pt says that it is seasonal allergies. Pt would like to know if provider could send in a Rx to help the cough. Pt says that she currently use an inhaler but it isn't helping. Pt says that she was sent  home from work  Pt would like further assistance.          Call History   Type Contact Phone/Fax User  08/06/2022 12:57 PM EST Phone (Incoming) [name], Rachel Hilario (Self) [phone] Lemmie Evens) [place], [place]

## 2022-08-06 NOTE — Telephone Encounter (Signed)
Patient called, left VM to return the call to the office to speak to a nurse.Unable to reach patient after 3 attempts by [facility] NT, routing to the provider for resolution per protocol.    Summary: cough   Pt called in for assistance. Pt says that she is experiencing a cough. Pt says that it is seasonal allergies. Pt would like to know if provider could send in a Rx to help the cough. Pt says that she currently use an inhaler but it isn't helping. Pt says that she was sent  home from work  Pt would like further assistance.

## 2022-08-06 NOTE — Telephone Encounter (Signed)
Attempted t return her call.   Left voicemail to call back to discuss symptoms with a nurse.

## 2022-08-06 NOTE — Telephone Encounter (Signed)
Provider aware, pt will need an appt for further evaluation

## 2022-08-06 NOTE — Telephone Encounter (Signed)
Attempted to reach, left VM to call back.

## 2022-09-10 ENCOUNTER — Ambulatory Visit (INDEPENDENT_AMBULATORY_CARE_PROVIDER_SITE_OTHER): Payer: PPO | Admitting: Family

## 2022-09-10 ENCOUNTER — Encounter: Payer: Self-pay | Admitting: Family

## 2022-09-10 VITALS — BP 151/78 | HR 69 | Temp 98.3°F | Resp 16 | Ht 62.99 in | Wt 193.0 lb

## 2022-09-10 DIAGNOSIS — E119 Type 2 diabetes mellitus without complications: Secondary | ICD-10-CM

## 2022-09-10 DIAGNOSIS — Z0001 Encounter for general adult medical examination with abnormal findings: Secondary | ICD-10-CM | POA: Diagnosis not present

## 2022-09-10 DIAGNOSIS — R059 Cough, unspecified: Secondary | ICD-10-CM

## 2022-09-10 DIAGNOSIS — Z13220 Encounter for screening for lipoid disorders: Secondary | ICD-10-CM

## 2022-09-10 DIAGNOSIS — Z13228 Encounter for screening for other metabolic disorders: Secondary | ICD-10-CM

## 2022-09-10 DIAGNOSIS — I10 Essential (primary) hypertension: Secondary | ICD-10-CM

## 2022-09-10 DIAGNOSIS — Z1329 Encounter for screening for other suspected endocrine disorder: Secondary | ICD-10-CM

## 2022-09-10 DIAGNOSIS — Z23 Encounter for immunization: Secondary | ICD-10-CM

## 2022-09-10 DIAGNOSIS — Z130 Encounter for screening for diseases of the blood and blood-forming organs and certain disorders involving the immune mechanism: Secondary | ICD-10-CM | POA: Diagnosis not present

## 2022-09-10 DIAGNOSIS — Z0000 Encounter for general adult medical examination without abnormal findings: Secondary | ICD-10-CM

## 2022-09-10 DIAGNOSIS — E1140 Type 2 diabetes mellitus with diabetic neuropathy, unspecified: Secondary | ICD-10-CM | POA: Diagnosis not present

## 2022-09-10 LAB — POCT GLYCOSYLATED HEMOGLOBIN (HGB A1C): Hemoglobin A1C: 8.4 % — AB (ref 4.0–5.6)

## 2022-09-10 MED ORDER — PREDNISONE 10 MG PO TABS: ORAL_TABLET | ORAL | 0 refills | Status: AC

## 2022-09-10 MED ORDER — REPAGLINIDE 2 MG PO TABS: 4.0000 mg | ORAL_TABLET | Freq: Three times a day (TID) | ORAL | 2 refills | Status: DC

## 2022-09-10 MED ORDER — REPAGLINIDE 2 MG PO TABS: 4.0000 mg | ORAL_TABLET | Freq: Two times a day (BID) | ORAL | 2 refills | Status: DC

## 2022-09-10 MED ORDER — VALSARTAN-HYDROCHLOROTHIAZIDE 320-25 MG PO TABS: 1.0000 | ORAL_TABLET | Freq: Every day | ORAL | 2 refills | Status: DC

## 2022-09-10 MED ORDER — SPIRONOLACTONE 25 MG PO TABS: 25.0000 mg | ORAL_TABLET | Freq: Every day | ORAL | 2 refills | Status: DC

## 2022-09-10 MED ORDER — AMLODIPINE BESYLATE 2.5 MG PO TABS: 2.5000 mg | ORAL_TABLET | Freq: Every day | ORAL | 2 refills | Status: DC

## 2022-09-10 NOTE — Progress Notes (Signed)
Subjective:   Rachel [name] is a 81 y.o. female who presents for Medicare Annual (Subsequent) preventive examination.  Review of Systems       Cardiac Risk Factors include: advanced age (>15mn, >>56women);diabetes mellitus     Objective:    Today's Vitals   09/10/22 1421  PainSc: 0-No pain   There is no height or weight on file to calculate BMI.     09/10/2022    2:22 PM 01/25/2021    9:42 AM 01/04/2021   11:15 AM 04/16/2017    4:32 PM 12/14/2015    3:21 PM  Advanced Directives  Does Patient Have a Medical Advance Directive? No Yes No No No  Type of Advance Directive  Living will     Would patient like information on creating a medical advance directive? Yes (Inpatient - patient defers creating a medical advance directive at this time - Information given)  Yes (Inpatient - patient defers creating a medical advance directive at this time - Information given) Yes (ED - Information included in AVS) Yes - Educational materials given    Current Medications (verified) Outpatient Encounter Medications as of 09/10/2022  Medication Sig   acetaminophen (TYLENOL) 500 MG tablet Take 1,000 mg by mouth 3 (three) times daily as needed.   albuterol (VENTOLIN HFA) 108 (90 Base) MCG/ACT inhaler Inhale 1-2 puffs into the lungs every 6 (six) hours as needed for wheezing or shortness of breath.   amLODipine (NORVASC) 2.5 MG tablet Take 1 tablet (2.5 mg total) by mouth daily.   baclofen (LIORESAL) 10 MG tablet Take 0.5-1 tablets (5-10 mg total) by mouth 3 (three) times daily as needed for muscle spasms.   Blood Glucose Monitoring Suppl (ONETOUCH VERIO REFLECT) w/Device KIT 1 each by Does not apply route daily. E11.9   cetirizine (ZYRTEC) 5 MG tablet Take 1 tablet (5 mg total) by mouth daily.   diclofenac Sodium (VOLTAREN) 1 % GEL Apply 2 g topically 4 (four) times daily as needed.   fluticasone (FLONASE) 50 MCG/ACT nasal spray Place 2 sprays into both nostrils daily.   gabapentin  (NEURONTIN) 100 MG capsule Take 1 capsule (100 mg total) by mouth at bedtime.   Glucose Blood (ONETOUCH VERIO VI) 1 each by Other route daily. E11.9   glucose blood (ONETOUCH VERIO) test strip Use to check blood sugars once a day   ibuprofen (ADVIL) 800 MG tablet Take 1 tablet (800 mg total) by mouth every 8 (eight) hours as needed.   loratadine (CLARITIN) 10 MG tablet Take 1 tablet (10 mg total) by mouth daily.   Multiple Vitamins-Minerals (HAIR SKIN AND NAILS FORMULA) TABS Take 1 tablet by mouth daily.   nystatin (MYCOSTATIN/NYSTOP) powder Apply topically 4 (four) times daily.   nystatin cream (MYCOSTATIN) Apply 1 application topically 2 (two) times daily.   OneTouch Delica Lancets 378GMISC E11.9   pseudoephedrine (SUDAFED) 30 MG tablet Take 1 tablet (30 mg total) by mouth every 8 (eight) hours as needed for congestion.   repaglinide (PRANDIN) 2 MG tablet Take 1 tablet (2 mg total) by mouth 3 (three) times daily before meals.   spironolactone (ALDACTONE) 25 MG tablet Take 1 tablet (25 mg total) by mouth daily.   valsartan-hydrochlorothiazide (DIOVAN-HCT) 320-25 MG tablet Take 1 tablet by mouth daily.   [DISCONTINUED] benzonatate (TESSALON) 100 MG capsule TAKE 1 CAPSULE BY MOUTH 2 TIMES DAILY AS NEEDED FOR COUGH.   No facility-administered encounter medications on file as of 09/10/2022.    Allergies (verified) Aspirin, Codeine  sulfate, Glimepiride, Metformin, and Propoxyphene n-acetaminophen   History: Past Medical History:  Diagnosis Date   Allergic rhinitis    Diabetes mellitus, type 2 ([place])    Hyperlipemia    Hypertension    Left shoulder pain 11/01/2021   Lipoma NEC    anterior upper chest   Osteoarthritis of knee    Shortness of breath 11/01/2021   Past Surgical History:  Procedure Laterality Date   ACNE CYST REMOVAL  1970   back   [place]   complete   Family History  Problem Relation Age of Onset   Diabetes  Mother    Heart attack Mother    Cancer Father        brain   Diabetes Sister    Hypertension Sister    Diabetes Brother    Hypertension Brother    Coronary artery disease Brother    Aneurysm Other        niece   Social History   Socioeconomic History   Marital status: Widowed    Spouse name: Not on file   Number of children: Not on file   Years of education: Not on file   Highest education level: Not on file  Occupational History   Occupation: department mang    Employer: [employer]: Retired  Tobacco Use   Smoking status: Never   Smokeless tobacco: Never  Vaping Use   Vaping Use: Never used  Substance and Sexual Activity   Alcohol use: Yes    Alcohol/week: 1.0 standard drink of alcohol    Types: 1 Glasses of wine per week    Comment: ocassionally   Drug use: No   Sexual activity: Not Currently  Other Topics Concern   Not on file  Social History Narrative   No regular exercise   Widowed   Social Determinants of Health   Financial Resource Strain: Low Risk  (11/01/2021)   Overall Financial Resource Strain (CARDIA)    Difficulty of Paying Living Expenses: Not hard at all  Food Insecurity: No Food Insecurity (11/01/2021)   Hunger Vital Sign    Worried About Running Out of Food in the Last Year: Never true    Ran Out of Food in the Last Year: Never true  Transportation Needs: No Transportation Needs (11/01/2021)   PRAPARE - Hydrologist (Medical): No    Lack of Transportation (Non-Medical): No  Physical Activity: Inactive (11/01/2021)   Exercise Vital Sign    Days of Exercise per Week: 0 days    Minutes of Exercise per Session: 0 min  Stress: Not on file  Social Connections: Not on file    Tobacco Counseling Counseling given: Not Answered   Clinical Intake:  Pre-visit preparation completed: Yes  Pain : 0-10 Pain Score: 0-No pain     Diabetes: Yes CBG done?: No Did pt. bring in CBG monitor from home?:  No  How often do you need to have someone help you when you read instructions, pamphlets, or other written materials from your doctor or pharmacy?: 1 - Never  Diabetic?Yes   Interpreter Needed?: No      Activities of Daily Living    09/10/2022    2:22 PM  In your present state of health, do you have any difficulty performing the following activities:  Hearing? 0  Vision? 0  Difficulty concentrating or making decisions? 0  Walking  or climbing stairs? 0  Dressing or bathing? 0  Doing errands, shopping? 0  Preparing Food and eating ? N  Using the Toilet? N  In the past six months, have you accidently leaked urine? N  Do you have problems with loss of bowel control? N  Managing your Medications? N  Managing your Finances? N  Housekeeping or managing your Housekeeping? N    Patient Care Team: Camillia Herter, NP as PCP - General (Nurse Practitioner)  Indicate any recent Medical Services you may have received from other than [facility] providers in the past year (date may be approximate).     Assessment:   This is a routine wellness examination for Benson.  Hearing/Vision screen No results found.  Dietary issues and exercise activities discussed: Current Exercise Habits: The patient does not participate in regular exercise at present, Exercise limited by: cardiac condition(s)   Goals Addressed   None   Depression Screen    09/10/2022    2:22 PM 06/14/2022    9:37 AM 08/11/2021    2:58 PM 06/14/2021    3:07 PM 04/07/2021    2:16 PM 04/06/2021    3:45 PM 04/04/2021   12:04 PM  PHQ 2/9 Scores  PHQ - 2 Score 0 0 0 0 0 2 0  PHQ- 9 Score  2  2       Fall Risk    09/10/2022    2:22 PM 06/14/2022    9:41 AM 11/22/2020   10:46 AM 11/03/2020    9:37 AM 07/20/2020    2:15 PM  Fall Risk   Falls in the past year? 0 1 0 0 0  Number falls in past yr: 0 0 0 0 0  Injury with Fall? 0 0 0 0 0  Risk for fall due to :   No Fall Risks  No Fall Risks  Follow up   Falls evaluation  completed  Falls prevention discussed    FALL RISK PREVENTION PERTAINING TO THE HOME:  Any stairs in or around the home? No  If so, are there any without handrails?  N/A Home free of loose throw rugs in walkways, pet beds, electrical cords, etc? Yes  Adequate lighting in your home to reduce risk of falls? Yes   ASSISTIVE DEVICES UTILIZED TO PREVENT FALLS:  Life alert? No  Use of a cane, walker or w/c? No  Grab bars in the bathroom? Yes  Shower chair or bench in shower? Yes  Elevated toilet seat or a handicapped toilet? Yes   TIMED UP AND GO:  Was the test performed? .  Length of time to ambulate 10 feet:  sec.     Cognitive Function:    09/10/2022    2:23 PM 01/04/2021   11:16 AM  MMSE - Mini Mental State Exam  Orientation to time 5 5  Orientation to Place 5 5  Registration 3 3  Attention/ Calculation 5 5  Recall 3 3  Language- name 2 objects 2 2  Language- repeat 1 1  Language- follow 3 step command 3 3  Language- read & follow direction 1 1  Write a sentence 1 1  Copy design 1 1  Total score 30 30        09/10/2022    2:23 PM  6CIT Screen  What Year? 0 points  What month? 0 points  What time? 0 points  Count back from 20 0 points  Months in reverse 0 points  Repeat phrase 0  points  Total Score 0 points    Immunizations Immunization History  Administered Date(s) Administered   PFIZER Comirnaty(Gray Top)Covid-19 Tri-Sucrose Vaccine 12/16/2020, 06/14/2022   PFIZER(Purple Top)SARS-COV-2 Vaccination 11/22/2019, 03/14/2020   PPD Test 03/14/2011   Pfizer Covid-19 Vaccine Bivalent Booster 65yr & up 05/31/2021   Pneumococcal Conjugate-13 04/16/2017   Pneumococcal Polysaccharide-23 09/15/2008   Td 09/15/2008    TDAP status: Completed at today's visit  Flu Vaccine status: Due, Education has been provided regarding the importance of this vaccine. Advised may receive this vaccine at local pharmacy or Health Dept. Aware to provide a copy of the vaccination  record if obtained from local pharmacy or Health Dept. Verbalized acceptance and understanding.  Pneumococcal vaccine status: Up to date  Covid-19 vaccine status: Completed vaccines  Qualifies for Shingles Vaccine? Yes   Zostavax completed No   Shingrix Completed?: No.    Education has been provided regarding the importance of this vaccine. Patient has been advised to call insurance company to determine out of pocket expense if they have not yet received this vaccine. Advised may also receive vaccine at local pharmacy or Health Dept. Verbalized acceptance and understanding.  Screening Tests Health Maintenance  Topic Date Due   OPHTHALMOLOGY EXAM  05/25/2010   Diabetic kidney evaluation - Urine ACR  12/11/2016   DTaP/Tdap/Td (2 - Tdap) 09/15/2018   Diabetic kidney evaluation - eGFR measurement  01/11/2021   FOOT EXAM  03/31/2021   HEMOGLOBIN A1C  02/08/2022   COVID-19 Vaccine (6 - 2023-24 season) 08/09/2022   Zoster Vaccines- Shingrix (1 of 2) 09/13/2022 (Originally 09/04/1993)   INFLUENZA VACCINE  12/23/2022 (Originally 04/24/2022)   DEXA SCAN  06/15/2023 (Originally 09/04/2008)   Medicare Annual Wellness (AWV)  09/11/2023   Pneumonia Vaccine 81 Years old  Completed   Hepatitis C Screening  Completed   HPV VACCINES  Aged Out    Health Maintenance  Health Maintenance Due  Topic Date Due   OPHTHALMOLOGY EXAM  05/25/2010   Diabetic kidney evaluation - Urine ACR  12/11/2016   DTaP/Tdap/Td (2 - Tdap) 09/15/2018   Diabetic kidney evaluation - eGFR measurement  01/11/2021   FOOT EXAM  03/31/2021   HEMOGLOBIN A1C  02/08/2022   COVID-19 Vaccine (6 - 2023-24 season) 08/09/2022    Colorectal cancer screening: No longer required.  PT DECLINED.  Mammogram status: No longer required due to PT DECLINED.  Bone Density status: Ordered PT DECLINED. Pt provided with contact info and advised to call to schedule appt.  Lung Cancer Screening: (Low Dose CT Chest recommended if Age 260-80 years, 30 pack-year currently smoking OR have quit w/in 15years.) does not qualify.   Lung Cancer Screening Referral: N/A  Additional Screening:  Hepatitis C Screening: does qualify; Completed 12/12/2015  Vision Screening: Recommended annual ophthalmology exams for early detection of glaucoma and other disorders of the eye. Is the patient up to date with their annual eye exam?  Yes  Who is the provider or what is the name of the office in which the patient attends annual eye exams? Dr. GKaty Fitch If pt is not established with a provider, would they like to be referred to a provider to establish care?  N/A .   Dental Screening: Recommended annual dental exams for proper oral hygiene  Community Resource Referral / Chronic Care Management: CRR required this visit?  No   CCM required this visit?  No      Plan:     I have personally reviewed and noted the  following in the patient's chart:   Medical and social history Use of alcohol, tobacco or illicit drugs  Current medications and supplements including opioid prescriptions. Patient is not currently taking opioid prescriptions. Functional ability and status Nutritional status Physical activity Advanced directives List of other physicians Hospitalizations, surgeries, and ER visits in previous 12 months Vitals Screenings to include cognitive, depression, and falls Referrals and appointments  In addition, I have reviewed and discussed with patient certain preventive protocols, quality metrics, and best practice recommendations. A written personalized care plan for preventive services as well as general preventive health recommendations were provided to patient.     Elmon Else, [facility]   09/10/2022

## 2022-09-10 NOTE — Progress Notes (Signed)
Subjective:   Rachel [name] is a 81 y.o. female who presents for Medicare Annual (Subsequent) preventive examination.  Review of Systems    - Reports she has been out of work since 09/02/2022 with upper respiratory symptoms. Employee of [employer] and thinks she contracted symptoms from her coworkers. She took an antibiotic and Tessalon capsules she received during an appointment with another provider. Tessalon capsules didn't help much. Using Albuterol inhaler as needed. Endorses lingering dry cough but overall symptoms gradually improved since began. Plans to return to work on tomorrow. Declines respiratory panel. Needs work letter to return to work.  - Doing well on chronic care medications. States she did not take blood pressure medications today because she thought she was supposed to fast (including not taking any medications) for today's appointment. Denies red flag symptoms.   Cardiac Risk Factors include: advanced age (>33mn, >>44women);diabetes mellitus  Objective:    Today's Vitals   09/10/22 1421 09/10/22 1516  BP: (!) 173/84 (!) 151/78  Pulse: 69   Resp: 16   Temp: 98.3 F (36.8 C)   SpO2: 98%   Weight: 193 lb (87.5 kg)   Height: 5' 2.99" (1.6 m)   PainSc: 0-No pain    Body mass index is 34.2 kg/m.   Physical Exam HENT:     Head: Normocephalic and atraumatic.  Eyes:     Extraocular Movements: Extraocular movements intact.     Conjunctiva/sclera: Conjunctivae normal.     Pupils: Pupils are equal, round, and reactive to light.  Cardiovascular:     Rate and Rhythm: Normal rate and regular rhythm.     Pulses: Normal pulses.     Heart sounds: Normal heart sounds.  Pulmonary:     Effort: Pulmonary effort is normal.     Breath sounds: Normal breath sounds.  Musculoskeletal:     Cervical back: Normal range of motion and neck supple.  Neurological:     General: No focal deficit present.     Mental Status: She is alert and oriented to person,  place, and time.  Psychiatric:        Mood and Affect: Mood normal.        Behavior: Behavior normal.       09/10/2022    2:22 PM 01/25/2021    9:42 AM 01/04/2021   11:15 AM 04/16/2017    4:32 PM 12/14/2015    3:21 PM  Advanced Directives  Does Patient Have a Medical Advance Directive? No Yes No No No  Type of Advance Directive  Living will     Would patient like information on creating a medical advance directive? Yes (Inpatient - patient defers creating a medical advance directive at this time - Information given)  Yes (Inpatient - patient defers creating a medical advance directive at this time - Information given) Yes (ED - Information included in AVS) Yes - Educational materials given    Current Medications (verified) Outpatient Encounter Medications as of 09/10/2022  Medication Sig   predniSONE (DELTASONE) 10 MG tablet Take 6 tablets (60 mg total) by mouth daily with breakfast for 1 day, THEN 5 tablets (50 mg total) daily with breakfast for 1 day, THEN 4 tablets (40 mg total) daily with breakfast for 1 day, THEN 3 tablets (30 mg total) daily with breakfast for 1 day, THEN 2 tablets (20 mg total) daily with breakfast for 1 day, THEN 1 tablet (10 mg total) daily with breakfast for 1 day.   acetaminophen (TYLENOL) 500  MG tablet Take 1,000 mg by mouth 3 (three) times daily as needed.   albuterol (VENTOLIN HFA) 108 (90 Base) MCG/ACT inhaler Inhale 1-2 puffs into the lungs every 6 (six) hours as needed for wheezing or shortness of breath.   amLODipine (NORVASC) 2.5 MG tablet Take 1 tablet (2.5 mg total) by mouth daily.   baclofen (LIORESAL) 10 MG tablet Take 0.5-1 tablets (5-10 mg total) by mouth 3 (three) times daily as needed for muscle spasms.   Blood Glucose Monitoring Suppl (ONETOUCH VERIO REFLECT) w/Device KIT 1 each by Does not apply route daily. E11.9   cetirizine (ZYRTEC) 5 MG tablet Take 1 tablet (5 mg total) by mouth daily.   diclofenac Sodium (VOLTAREN) 1 % GEL Apply 2 g topically  4 (four) times daily as needed.   fluticasone (FLONASE) 50 MCG/ACT nasal spray Place 2 sprays into both nostrils daily.   gabapentin (NEURONTIN) 100 MG capsule Take 1 capsule (100 mg total) by mouth at bedtime.   Glucose Blood (ONETOUCH VERIO VI) 1 each by Other route daily. E11.9   glucose blood (ONETOUCH VERIO) test strip Use to check blood sugars once a day   ibuprofen (ADVIL) 800 MG tablet Take 1 tablet (800 mg total) by mouth every 8 (eight) hours as needed.   loratadine (CLARITIN) 10 MG tablet Take 1 tablet (10 mg total) by mouth daily.   Multiple Vitamins-Minerals (HAIR SKIN AND NAILS FORMULA) TABS Take 1 tablet by mouth daily.   nystatin (MYCOSTATIN/NYSTOP) powder Apply topically 4 (four) times daily.   nystatin cream (MYCOSTATIN) Apply 1 application topically 2 (two) times daily.   OneTouch Delica Lancets 05W MISC E11.9   pseudoephedrine (SUDAFED) 30 MG tablet Take 1 tablet (30 mg total) by mouth every 8 (eight) hours as needed for congestion.   repaglinide (PRANDIN) 2 MG tablet Take 2 tablets (4 mg total) by mouth 2 (two) times daily before a meal.   spironolactone (ALDACTONE) 25 MG tablet Take 1 tablet (25 mg total) by mouth daily.   valsartan-hydrochlorothiazide (DIOVAN-HCT) 320-25 MG tablet Take 1 tablet by mouth daily.   [DISCONTINUED] amLODipine (NORVASC) 2.5 MG tablet Take 1 tablet (2.5 mg total) by mouth daily.   [DISCONTINUED] benzonatate (TESSALON) 100 MG capsule TAKE 1 CAPSULE BY MOUTH 2 TIMES DAILY AS NEEDED FOR COUGH.   [DISCONTINUED] repaglinide (PRANDIN) 2 MG tablet Take 1 tablet (2 mg total) by mouth 3 (three) times daily before meals.   [DISCONTINUED] repaglinide (PRANDIN) 2 MG tablet Take 2 tablets (4 mg total) by mouth 3 (three) times daily before meals.   [DISCONTINUED] spironolactone (ALDACTONE) 25 MG tablet Take 1 tablet (25 mg total) by mouth daily.   [DISCONTINUED] valsartan-hydrochlorothiazide (DIOVAN-HCT) 320-25 MG tablet Take 1 tablet by mouth daily.   No  facility-administered encounter medications on file as of 09/10/2022.    Allergies (verified) Aspirin, Codeine sulfate, Glimepiride, Metformin, and Propoxyphene n-acetaminophen   History: Past Medical History:  Diagnosis Date   Allergic rhinitis    Diabetes mellitus, type 2 ([place])    Hyperlipemia    Hypertension    Left shoulder pain 11/01/2021   Lipoma NEC    anterior upper chest   Osteoarthritis of knee    Shortness of breath 11/01/2021   Past Surgical History:  Procedure Laterality Date   ACNE CYST REMOVAL  1970   back   [place]   complete   Family History  Problem Relation Age of Onset  Diabetes Mother    Heart attack Mother    Cancer Father        brain   Diabetes Sister    Hypertension Sister    Diabetes Brother    Hypertension Brother    Coronary artery disease Brother    Aneurysm Other        niece   Social History   Socioeconomic History   Marital status: Widowed    Spouse name: Not on file   Number of children: Not on file   Years of education: Not on file   Highest education level: Not on file  Occupational History   Occupation: department mang    Employer: [employer]: Retired  Tobacco Use   Smoking status: Never    Passive exposure: Never   Smokeless tobacco: Never  Vaping Use   Vaping Use: Never used  Substance and Sexual Activity   Alcohol use: Yes    Alcohol/week: 1.0 standard drink of alcohol    Types: 1 Glasses of wine per week    Comment: ocassionally   Drug use: No   Sexual activity: Not Currently  Other Topics Concern   Not on file  Social History Narrative   No regular exercise   Widowed   Social Determinants of Health   Financial Resource Strain: Low Risk  (11/01/2021)   Overall Financial Resource Strain (CARDIA)    Difficulty of Paying Living Expenses: Not hard at all  Food Insecurity: No Food Insecurity (11/01/2021)   Hunger Vital Sign     Worried About Running Out of Food in the Last Year: Never true    Ran Out of Food in the Last Year: Never true  Transportation Needs: No Transportation Needs (11/01/2021)   PRAPARE - Hydrologist (Medical): No    Lack of Transportation (Non-Medical): No  Physical Activity: Inactive (11/01/2021)   Exercise Vital Sign    Days of Exercise per Week: 0 days    Minutes of Exercise per Session: 0 min  Stress: Not on file  Social Connections: Not on file    Tobacco Counseling Never smoked.  Clinical Intake:  Pre-visit preparation completed: Yes  Pain : 0-10 Pain Score: 0-No pain  Diabetes: Yes CBG done?: No Did pt. bring in CBG monitor from home?: No  How often do you need to have someone help you when you read instructions, pamphlets, or other written materials from your doctor or pharmacy?: 1 - Never  Diabetic?Yes   Interpreter Needed?: No  Activities of Daily Living    09/10/2022    2:22 PM  In your present state of health, do you have any difficulty performing the following activities:  Hearing? 0  Vision? 0  Difficulty concentrating or making decisions? 0  Walking or climbing stairs? 0  Dressing or bathing? 0  Doing errands, shopping? 0  Preparing Food and eating ? N  Using the Toilet? N  In the past six months, have you accidently leaked urine? N  Do you have problems with loss of bowel control? N  Managing your Medications? N  Managing your Finances? N  Housekeeping or managing your Housekeeping? N    Patient Care Team: Camillia Herter, NP as PCP - General (Family Medicine)  Indicate any recent Medical Services you may have received from other than [facility] providers in the past year (date may be approximate).  Assessment:   This is a routine wellness examination for [place].  Hearing/Vision  screen - Wearing reading glasses as needed. Established with [facility].  - No hearing issues.   Dietary issues and exercise activities  discussed: Current Exercise Habits: The patient does not participate in regular exercise at present, Exercise limited by: cardiac condition(s)  Goals Addressed: Exercise more.    Depression Screen    09/10/2022    2:22 PM 06/14/2022    9:37 AM 08/11/2021    2:58 PM 06/14/2021    3:07 PM 04/07/2021    2:16 PM 04/06/2021    3:45 PM 04/04/2021   12:04 PM  PHQ 2/9 Scores  PHQ - 2 Score 0 0 0 0 0 2 0  PHQ- 9 Score  2  2       Fall Risk    09/10/2022    2:22 PM 06/14/2022    9:41 AM 11/22/2020   10:46 AM 11/03/2020    9:37 AM 07/20/2020    2:15 PM  Fall Risk   Falls in the past year? 0 1 0 0 0  Number falls in past yr: 0 0 0 0 0  Injury with Fall? 0 0 0 0 0  Risk for fall due to :   No Fall Risks  No Fall Risks  Follow up   Falls evaluation completed  Falls prevention discussed    FALL RISK PREVENTION PERTAINING TO THE HOME: Any stairs in or around the home? No  If so, are there any without handrails?  N/A Home free of loose throw rugs in walkways, pet beds, electrical cords, etc? Yes  Adequate lighting in your home to reduce risk of falls? Yes   ASSISTIVE DEVICES UTILIZED TO PREVENT FALLS: Life alert? No  Use of a cane, walker or w/c? No  Grab bars in the bathroom? Yes  Shower chair or bench in shower? Yes  Elevated toilet seat or a handicapped toilet? Yes   TIMED UP AND GO: Was the test performed? Yes .  Length of time to ambulate 10 feet: 10 sec.   Gait slow and steady without use of assistive device  Cognitive Function:    09/10/2022    2:23 PM 01/04/2021   11:16 AM  MMSE - Mini Mental State Exam  Orientation to time 5 5  Orientation to Place 5 5  Registration 3 3  Attention/ Calculation 5 5  Recall 3 3  Language- name 2 objects 2 2  Language- repeat 1 1  Language- follow 3 step command 3 3  Language- read & follow direction 1 1  Write a sentence 1 1  Copy design 1 1  Total score 30 30       09/10/2022    2:23 PM  6CIT Screen  What Year? 0 points   What month? 0 points  What time? 0 points  Count back from 20 0 points  Months in reverse 0 points  Repeat phrase 0 points  Total Score 0 points    Immunizations Immunization History  Administered Date(s) Administered   PFIZER Comirnaty(Gray Top)Covid-19 Tri-Sucrose Vaccine 12/16/2020, 06/14/2022   PFIZER(Purple Top)SARS-COV-2 Vaccination 11/22/2019, 03/14/2020   PPD Test 03/14/2011   Pfizer Covid-19 Vaccine Bivalent Booster 72yr & up 05/31/2021   Pneumococcal Conjugate-13 04/16/2017   Pneumococcal Polysaccharide-23 09/15/2008   Td 09/15/2008   Tdap 09/10/2022    TDAP status: Completed at today's visit  Flu Vaccine status: Declined, Education has been provided regarding the importance of this vaccine but patient still declined. Advised may receive this vaccine at local pharmacy or  Health Dept. Aware to provide a copy of the vaccination record if obtained from local pharmacy or Health Dept. Verbalized acceptance and understanding.  Pneumococcal vaccine status: Up to date  Covid-19 vaccine status: Completed vaccines  Qualifies for Shingles Vaccine? No   Zostavax completed No   Shingrix Completed?: No.    Education has been provided regarding the importance of this vaccine. Patient has been advised to call insurance company to determine out of pocket expense if they have not yet received this vaccine. Advised may also receive vaccine at local pharmacy or Health Dept. Verbalized acceptance and understanding.  Screening Tests Health Maintenance  Topic Date Due   OPHTHALMOLOGY EXAM  05/25/2010   Diabetic kidney evaluation - Urine ACR  12/11/2016   Diabetic kidney evaluation - eGFR measurement  01/11/2021   FOOT EXAM  03/31/2021   COVID-19 Vaccine (6 - 2023-24 season) 08/09/2022   Zoster Vaccines- Shingrix (1 of 2) 09/13/2022 (Originally 09/04/1993)   INFLUENZA VACCINE  12/23/2022 (Originally 04/24/2022)   DEXA SCAN  06/15/2023 (Originally 09/04/2008)   HEMOGLOBIN A1C   03/12/2023   Medicare Annual Wellness (AWV)  09/11/2023   DTaP/Tdap/Td (3 - Td or Tdap) 09/10/2032   Pneumonia Vaccine 2+ Years old  Completed   Hepatitis C Screening  Completed   HPV VACCINES  Aged Out    Health Maintenance  Health Maintenance Due  Topic Date Due   OPHTHALMOLOGY EXAM  05/25/2010   Diabetic kidney evaluation - Urine ACR  12/11/2016   Diabetic kidney evaluation - eGFR measurement  01/11/2021   FOOT EXAM  03/31/2021   COVID-19 Vaccine (6 - 2023-24 season) 08/09/2022    Colorectal cancer screening: Declined   Mammogram: Declined  Bone Density status: Ordered DECLINED. Pt provided with contact info and advised to call to schedule appt. declined  Lung Cancer Screening: (Low Dose CT Chest recommended if Age 59-80 years, 30 pack-year currently smoking OR have quit w/in 15years.) does not qualify.   Lung Cancer Screening Referral: N/A  Additional Screening:  Hepatitis C Screening: does qualify; Completed 12/12/2015  Vision Screening: Recommended annual ophthalmology exams for early detection of glaucoma and other disorders of the eye. Is the patient up to date with their annual eye exam?  Yes  Who is the provider or what is the name of the office in which the patient attends annual eye exams? [facility] If pt is not established with a provider, would they like to be referred to a provider to establish care? N/A  Dental Screening: Recommended annual dental exams for proper oral hygiene  Community Resource Referral / Chronic Care Management: CRR required this visit?  No   CCM required this visit?  No   Plan:  1. Medicare annual wellness visit, subsequent - Counseled on 150 minutes of exercise per week as tolerated, healthy eating (including decreased daily intake of saturated fats, cholesterol, added sugars, sodium), STI prevention, and routine healthcare maintenance.  2. Screening for metabolic disorder - Routine screening.  - CMP14+EGFR  3. Screening  for deficiency anemia - Routine screening.  - CBC  4. Screening cholesterol level - Routine screening.  - Lipid panel  5. Thyroid disorder screen - Routine screening.  - TSH  6. Primary hypertension - Blood pressure not at goal during today's visit. Patient asymptomatic without chest pressure, chest pain, palpitations, shortness of breath, worst headache of life, and any additional red flag symptoms. - Patient has not taken blood pressure medication for the day as of yet. She plans to  take when she returns home after today's office visit.  - Continue Amlodipine, Spironolactone, and Valsartan-Hydrochlorothiazide as prescribed.  - Counseled on blood pressure goal of less than 140/90, low-sodium, DASH diet, medication compliance, 150 minutes of moderate intensity exercise per week as tolerated. Discussed medication compliance, adverse effects. - Keep all scheduled appointments with Cardiology.  - amLODipine (NORVASC) 2.5 MG tablet; Take 1 tablet (2.5 mg total) by mouth daily.  Dispense: 30 tablet; Refill: 2 - spironolactone (ALDACTONE) 25 MG tablet; Take 1 tablet (25 mg total) by mouth daily.  Dispense: 30 tablet; Refill: 2 - valsartan-hydrochlorothiazide (DIOVAN-HCT) 320-25 MG tablet; Take 1 tablet by mouth daily.  Dispense: 30 tablet; Refill: 2  7. Type 2 diabetes mellitus with diabetic neuropathy, without long-term current use of insulin (HCC) - Hemoglobin A1c not at goal at 8.4%, goal 8%. However, this is improved from previous 8.9%.  - Patient intolerant of Glimepiride and Metformin.  - Increase Repaglinide from 2 mg three times daily with meals to 4 mg twice daily with meals.  - Discussed the importance of healthy eating habits, low-carbohydrate diet, low-sugar diet, regular aerobic exercise (at least 150 minutes a week as tolerated) and medication compliance to achieve or maintain control of diabetes. - Diabetic kidney evaluation result pending.  - Follow-up with primary provider in 4  weeks or sooner if needed.  - Microalbumin / creatinine urine ratio - POCT glycosylated hemoglobin (Hb A1C) - repaglinide (PRANDIN) 2 MG tablet; Take 2 tablets (4 mg total) by mouth 2 (two) times daily before a meal.  Dispense: 120 tablet; Refill: 2  8. Need for Tdap vaccination - Administered.  - Tdap vaccine greater than or equal to 7yo IM  10. Cough, unspecified type - Patient declined respiratory panel.  - Patient provided with return to work letter.  - Follow-up with primary provider as scheduled.  - predniSONE (DELTASONE) 10 MG tablet; Take 6 tablets (60 mg total) by mouth daily with breakfast for 1 day, THEN 5 tablets (50 mg total) daily with breakfast for 1 day, THEN 4 tablets (40 mg total) daily with breakfast for 1 day, THEN 3 tablets (30 mg total) daily with breakfast for 1 day, THEN 2 tablets (20 mg total) daily with breakfast for 1 day, THEN 1 tablet (10 mg total) daily with breakfast for 1 day.  Dispense: 21 tablet; Refill: 0  I have personally reviewed and noted the following in the patient's chart:   Medical and social history Use of alcohol, tobacco or illicit drugs  Current medications and supplements including opioid prescriptions. Patient is not currently taking opioid prescriptions. Functional ability and status Nutritional status Physical activity Advanced directives List of other physicians Hospitalizations, surgeries, and ER visits in previous 12 months Vitals Screenings to include cognitive, depression, and falls Referrals and appointments  In addition, I have reviewed and discussed with patient certain preventive protocols, quality metrics, and best practice recommendations. A written personalized care plan for preventive services as well as general preventive health recommendations were provided to patient.     Camillia Herter, NP   09/10/2022

## 2022-09-11 ENCOUNTER — Other Ambulatory Visit: Payer: Self-pay | Admitting: Family

## 2022-09-11 DIAGNOSIS — E785 Hyperlipidemia, unspecified: Secondary | ICD-10-CM

## 2022-09-11 LAB — CMP14+EGFR
ALT: 18 IU/L (ref 0–32)
AST: 15 IU/L (ref 0–40)
Albumin/Globulin Ratio: 1.2 (ref 1.2–2.2)
Albumin: 4 g/dL (ref 3.8–4.8)
Alkaline Phosphatase: 78 IU/L (ref 44–121)
BUN/Creatinine Ratio: 17 (ref 12–28)
BUN: 16 mg/dL (ref 8–27)
Bilirubin Total: 0.4 mg/dL (ref 0.0–1.2)
CO2: 23 mmol/L (ref 20–29)
Calcium: 9.5 mg/dL (ref 8.7–10.3)
Chloride: 103 mmol/L (ref 96–106)
Creatinine, Ser: 0.94 mg/dL (ref 0.57–1.00)
Globulin, Total: 3.3 g/dL (ref 1.5–4.5)
Glucose: 159 mg/dL — ABNORMAL HIGH (ref 70–99)
Potassium: 5.2 mmol/L (ref 3.5–5.2)
Sodium: 140 mmol/L (ref 134–144)
Total Protein: 7.3 g/dL (ref 6.0–8.5)
eGFR: 62 mL/min/{1.73_m2} (ref >59)

## 2022-09-11 LAB — CBC
Hematocrit: 37.4 % (ref 34.0–46.6)
Hemoglobin: 12 g/dL (ref 11.1–15.9)
MCH: 27.2 pg (ref 26.6–33.0)
MCHC: 32.1 g/dL (ref 31.5–35.7)
MCV: 85 fL (ref 79–97)
Platelets: 370 10*3/uL (ref 150–450)
RBC: 4.41 x10E6/uL (ref 3.77–5.28)
RDW: 14.7 % (ref 11.7–15.4)
WBC: 8 10*3/uL (ref 3.4–10.8)

## 2022-09-11 LAB — LIPID PANEL
Chol/HDL Ratio: 5.3 ratio — ABNORMAL HIGH (ref 0.0–4.4)
Cholesterol, Total: 223 mg/dL — ABNORMAL HIGH (ref 100–199)
HDL: 42 mg/dL (ref >39)
LDL Chol Calc (NIH): 145 mg/dL — ABNORMAL HIGH (ref 0–99)
Triglycerides: 196 mg/dL — ABNORMAL HIGH (ref 0–149)
VLDL Cholesterol Cal: 36 mg/dL (ref 5–40)

## 2022-09-11 LAB — MICROALBUMIN / CREATININE URINE RATIO
Creatinine, Urine: 183.1 mg/dL
Microalb/Creat Ratio: 197 mg/g creat — ABNORMAL HIGH (ref 0–29)
Microalbumin, Urine: 360.6 ug/mL

## 2022-09-11 LAB — TSH: TSH: 1.75 u[IU]/mL (ref 0.450–4.500)

## 2022-09-11 MED ORDER — ATORVASTATIN CALCIUM 20 MG PO TABS: 20.0000 mg | ORAL_TABLET | Freq: Every day | ORAL | 2 refills | Status: DC

## 2022-09-12 ENCOUNTER — Telehealth: Payer: Self-pay | Admitting: Family

## 2022-09-12 NOTE — Telephone Encounter (Signed)
Spoke to pt advised her that provider did not send in [place] wanted her to try prednisone first, pt has picked up medication from pharmacy

## 2022-09-12 NOTE — Telephone Encounter (Signed)
The Zpack was sent into [facility] [address] on 09/10/22.          predniSONE (DELTASONE) 10 MG tablet 21 tablet 0 09/10/2022 09/16/2022   Sig - Route: Take 6 tablets (60 mg total) by mouth daily with breakfast for 1 day, THEN 5 tablets (50 mg total) daily with breakfast for 1 day, THEN 4 tablets (40 mg total) daily with breakfast for 1 day, THEN 3 tablets (30 mg total) daily with breakfast for 1 day, THEN 2 tablets (20 mg total) daily with breakfast for 1 day, THEN 1 tablet (10 mg total) daily with breakfast for 1 day. - Oral   Sent to pharmacy as: predniSONE (DELTASONE) 10 MG tablet   E-Prescribing Status: Receipt confirmed by pharmacy (09/10/2022  4:30 PM EST)

## 2022-09-12 NOTE — Telephone Encounter (Signed)
Pt called to report that she has yet to receive her Zpack per the discussion during her appt yesterday. She is [facility] for [facility]   [id number] [place] [id number] Phone: [phone]: [phone]

## 2022-10-24 ENCOUNTER — Other Ambulatory Visit: Payer: Self-pay | Admitting: Family

## 2022-10-24 ENCOUNTER — Other Ambulatory Visit: Payer: Self-pay | Admitting: Physician Assistant

## 2022-10-24 DIAGNOSIS — R051 Acute cough: Secondary | ICD-10-CM

## 2022-10-24 DIAGNOSIS — J4531 Mild persistent asthma with (acute) exacerbation: Secondary | ICD-10-CM

## 2022-10-25 NOTE — Telephone Encounter (Signed)
Unable to refill per protocol, Rx expired. Medication was discontinued 09/10/22.   Requested Prescriptions  Pending Prescriptions Disp Refills   benzonatate (TESSALON) 100 MG capsule [Pharmacy Med Name: BENZONATATE 100 MG CAPSULE] 30 capsule 1    Sig: TAKE 1 CAPSULE BY MOUTH 2 TIMES DAILY AS NEEDED FOR COUGH. NOT COVERED     Ear, Nose, and Throat:  Antitussives/Expectorants Passed - 10/24/2022  5:01 PM      Passed - Valid encounter within last 12 months    Recent Outpatient Visits           1 month ago Medicare annual wellness visit, subsequent   [facility], Sharon J, NP   4 months ago Type 2 diabetes mellitus with diabetic neuropathy, without long-term current use of insulin [facility])   [facility], Dionne Bucy, [place]   1 year ago Essential hypertension   [facility] [place] [facility], Kriste Basque, NP   1 year ago Type 2 diabetes mellitus with diabetic neuropathy, without long-term current use of insulin ([place])   [facility], MD   1 year ago Essential hypertension   [place] [facility], [place], NP       Future Appointments             In 1 month Camillia Herter, NP [place] at [facility]

## 2022-10-25 NOTE — Telephone Encounter (Signed)
Order complete. 

## 2022-11-18 ENCOUNTER — Other Ambulatory Visit: Payer: Self-pay | Admitting: Physician Assistant

## 2022-11-18 DIAGNOSIS — J0100 Acute maxillary sinusitis, unspecified: Secondary | ICD-10-CM

## 2022-11-19 NOTE — Telephone Encounter (Signed)
Requested Prescriptions  Pending Prescriptions Disp Refills   loratadine (CLARITIN) 10 MG tablet [Pharmacy Med Name: LORATADINE 10 MG TABLET] 90 tablet 1    Sig: TAKE 1 TABLET BY MOUTH EVERY DAY (DRUG NOT COVERED BY INS)     Ear, Nose, and Throat:  Antihistamines 2 Passed - 11/18/2022  8:53 AM      Passed - Cr in normal range and within 360 days    Creatinine, Ser  Date Value Ref Range Status  09/10/2022 0.94 0.57 - 1.00 mg/dL Final   Creatinine,U  Date Value Ref Range Status  12/12/2015 159.4 mg/dL Final         Passed - Valid encounter within last 12 months    Recent Outpatient Visits           2 months ago Medicare annual wellness visit, subsequent   [facility], Amy J, NP   5 months ago Type 2 diabetes mellitus with diabetic neuropathy, without long-term current use of insulin ([place])   [place] [facility], Dionne Bucy, [place]   1 year ago Essential hypertension   [place] [facility], Kriste Basque, NP   1 year ago Type 2 diabetes mellitus with diabetic neuropathy, without long-term current use of insulin ([place])   [place] [facility], MD   1 year ago Essential hypertension   [place] [facility], [place], NP       Future Appointments             In 3 weeks Camillia Herter, NP [facility]

## 2022-12-07 NOTE — Progress Notes (Signed)
Patient ID: Rachel [name], female    DOB: 1940/12/07  MRN: [id number]  CC: Chronic Care Management   Subjective: Rachel [name] is a 82 y.o. female who presents for chronic care management.   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   Bacterial pharyngitis 08/25/2014   Acute bronchitis 07/03/2014   Oral thrush 07/03/2014   Onychomycosis 02/18/2013   URI (upper respiratory infection) 07/11/2012   Cough 07/11/2012   Ganglion cyst 06/25/2011   Foot pain, left 06/25/2011   Otitis media 06/25/2011   LIPOMA 11/03/2010   EUSTACHIAN TUBE DYSFUNCTION, RIGHT 02/18/2010   Asthma with acute exacerbation 11/02/2009   ONYCHOMYCOSIS, TOENAILS 05/18/2009   Hyperlipidemia 09/15/2008   DM2 (diabetes mellitus, type 2) (HCC) 04/19/2007   Essential hypertension 04/19/2007   Allergic rhinitis 04/19/2007   OSTEOARTHRITIS 04/19/2007     Current Outpatient Medications on File Prior to Visit  Medication Sig Dispense Refill   acetaminophen (TYLENOL) 500 MG tablet Take 1,000 mg by mouth 3 (three) times daily as needed.     albuterol (VENTOLIN HFA) 108 (90 Base) MCG/ACT inhaler INHALE 1-2 PUFFS BY MOUTH EVERY 6 HOURS AS NEEDED FOR WHEEZE OR SHORTNESS OF BREATH 18 each 1   amLODipine (NORVASC) 2.5 MG tablet Take 1 tablet (2.5 mg total) by mouth daily. 30 tablet 2   atorvastatin (LIPITOR) 20 MG tablet Take 1 tablet (20 mg total) by mouth daily. 30 tablet 2   baclofen (LIORESAL) 10 MG tablet Take 0.5-1 tablets (5-10 mg total) by mouth 3 (three) times daily as needed for muscle spasms. 30 each 3   Blood Glucose Monitoring Suppl (ONETOUCH VERIO REFLECT) w/Device KIT 1 each by  Does not apply route daily. E11.9     cetirizine (ZYRTEC) 5 MG tablet Take 1 tablet (5 mg total) by mouth daily. 90 tablet 0   diclofenac Sodium (VOLTAREN) 1 % GEL Apply 2 g topically 4 (four) times daily as needed. 50 g 1   fluticasone (FLONASE) 50 MCG/ACT nasal spray Place 2 sprays into both nostrils daily. 16 g 12   gabapentin (NEURONTIN) 100 MG capsule Take 1 capsule (100 mg total) by mouth at bedtime. 30 capsule 0   Glucose Blood (ONETOUCH VERIO VI) 1 each by Other route daily. E11.9     glucose blood (ONETOUCH VERIO) test strip Use to check blood sugars once a day 100 strip 5   ibuprofen (ADVIL) 800 MG tablet Take 1 tablet (800 mg total) by mouth every 8 (eight) hours as needed. 30 tablet 1   loratadine (CLARITIN) 10 MG tablet Take 1 tablet (10 mg total) by mouth daily. 90 tablet 1   Multiple Vitamins-Minerals (HAIR SKIN AND NAILS FORMULA) TABS Take 1 tablet by mouth daily.     nystatin (MYCOSTATIN/NYSTOP) powder Apply topically 4 (four) times daily. 60 g 0   nystatin cream (MYCOSTATIN) Apply 1 application topically 2 (two) times daily. 30 g 1   OneTouch Delica Lancets 30G MISC E11.9 100 each 3   pseudoephedrine (SUDAFED) 30 MG tablet Take 1 tablet (30 mg total) by mouth every 8 (eight) hours as needed for congestion. 30 tablet 0   repaglinide (PRANDIN)  2 MG tablet Take 2 tablets (4 mg total) by mouth 2 (two) times daily before a meal. 120 tablet 2   spironolactone (ALDACTONE) 25 MG tablet Take 1 tablet (25 mg total) by mouth daily. 30 tablet 2   valsartan-hydrochlorothiazide (DIOVAN-HCT) 320-25 MG tablet Take 1 tablet by mouth daily. 30 tablet 2   No current facility-administered medications on file prior to visit.    Allergies  Allergen Reactions   Aspirin Nausea And Vomiting    Uncoated aspirin Jittery GI upset   Codeine Sulfate Nausea And Vomiting    Heart races.   Glimepiride Diarrhea   Metformin Diarrhea   Propoxyphene N-Acetaminophen Nausea And Vomiting    Social  History   Socioeconomic History   Marital status: Widowed    Spouse name: Not on file   Number of children: Not on file   Years of education: Not on file   Highest education level: Not on file  Occupational History   Occupation: department mang    Employer: [employer]    Comment: Retired  Tobacco Use   Smoking status: Never    Passive exposure: Never   Smokeless tobacco: Never  Vaping Use   Vaping Use: Never used  Substance and Sexual Activity   Alcohol use: Yes    Alcohol/week: 1.0 standard drink of alcohol    Types: 1 Glasses of wine per week    Comment: ocassionally   Drug use: No   Sexual activity: Not Currently  Other Topics Concern   Not on file  Social History Narrative   No regular exercise   Widowed   Social Determinants of Health   Financial Resource Strain: Low Risk  (11/01/2021)   Overall Financial Resource Strain (CARDIA)    Difficulty of Paying Living Expenses: Not hard at all  Food Insecurity: No Food Insecurity (11/01/2021)   Hunger Vital Sign    Worried About Running Out of Food in the Last Year: Never true    Ran Out of Food in the Last Year: Never true  Transportation Needs: No Transportation Needs (11/01/2021)   PRAPARE - Administrator, Civil Service (Medical): No    Lack of Transportation (Non-Medical): No  Physical Activity: Inactive (11/01/2021)   Exercise Vital Sign    Days of Exercise per Week: 0 days    Minutes of Exercise per Session: 0 min  Stress: Not on file  Social Connections: Not on file  Intimate Partner Violence: Not on file    Family History  Problem Relation Age of Onset   Diabetes Mother    Heart attack Mother    Cancer Father        brain   Diabetes Sister    Hypertension Sister    Diabetes Brother    Hypertension Brother    Coronary artery disease Brother    Aneurysm Other        niece    Past Surgical History:  Procedure Laterality Date   ACNE CYST REMOVAL  1970   back   APPENDECTOMY  1963    OOPHORECTOMY  1985   VAGINAL HYSTERECTOMY  1985   complete    ROS: Review of Systems Negative except as stated above  PHYSICAL EXAM: There were no vitals taken for this visit.  Physical Exam  {female adult master:310786} {female adult master:310785}     Latest Ref Rng & Units 09/10/2022    3:54 PM 01/12/2020    1:50 PM 11/20/2018    2:08 PM  CMP  Glucose 70 - 99 mg/dL 161  096  045   BUN 8 - 27 mg/dL 16  23  16    Creatinine 0.57 - 1.00 mg/dL 4.09  8.11  9.14   Sodium 134 - 144 mmol/L 140  133  139   Potassium 3.5 - 5.2 mmol/L 5.2  4.6  4.5   Chloride 96 - 106 mmol/L 103  98  102   CO2 20 - 29 mmol/L 23  27  29    Calcium 8.7 - 10.3 mg/dL 9.5  9.7  9.5   Total Protein 6.0 - 8.5 g/dL 7.3  8.1    Total Bilirubin 0.0 - 1.2 mg/dL 0.4  0.7    Alkaline Phos 44 - 121 IU/L 78  80    AST 0 - 40 IU/L 15  13    ALT 0 - 32 IU/L 18  12     Lipid Panel     Component Value Date/Time   CHOL 223 (H) 09/10/2022 1554   TRIG 196 (H) 09/10/2022 1554   HDL 42 09/10/2022 1554   CHOLHDL 5.3 (H) 09/10/2022 1554   CHOLHDL 5 01/12/2020 1350   VLDL 45.2 (H) 01/12/2020 1350   LDLCALC 145 (H) 09/10/2022 1554   LDLDIRECT 139.0 01/12/2020 1350    CBC    Component Value Date/Time   WBC 8.0 09/10/2022 1554   WBC 7.9 01/12/2020 1350   RBC 4.41 09/10/2022 1554   RBC 4.40 01/12/2020 1350   HGB 12.0 09/10/2022 1554   HCT 37.4 09/10/2022 1554   PLT 370 09/10/2022 1554   MCV 85 09/10/2022 1554   MCH 27.2 09/10/2022 1554   MCH 28.1 01/04/2011 1410   MCHC 32.1 09/10/2022 1554   MCHC 33.2 01/12/2020 1350   RDW 14.7 09/10/2022 1554   LYMPHSABS 2.8 01/12/2020 1350   MONOABS 0.4 01/12/2020 1350   EOSABS 0.1 01/12/2020 1350   BASOSABS 0.1 01/12/2020 1350    ASSESSMENT AND PLAN:  There are no diagnoses linked to this encounter.   Patient was given the opportunity to ask questions.  Patient verbalized understanding of the plan and was able to repeat key elements of the plan. Patient was given  clear instructions to go to Emergency Department or return to medical center if symptoms don't improve, worsen, or new problems develop.The patient verbalized understanding.   No orders of the defined types were placed in this encounter.    Requested Prescriptions    No prescriptions requested or ordered in this encounter    No follow-ups on file.  Rema Fendt, NP

## 2022-12-10 ENCOUNTER — Encounter: Payer: PPO | Admitting: Family

## 2022-12-10 DIAGNOSIS — E1140 Type 2 diabetes mellitus with diabetic neuropathy, unspecified: Secondary | ICD-10-CM

## 2023-01-04 ENCOUNTER — Other Ambulatory Visit: Payer: Self-pay | Admitting: Physician Assistant

## 2023-01-04 DIAGNOSIS — J0100 Acute maxillary sinusitis, unspecified: Secondary | ICD-10-CM

## 2023-01-13 ENCOUNTER — Emergency Department (HOSPITAL_BASED_OUTPATIENT_CLINIC_OR_DEPARTMENT_OTHER)
Admission: EM | Admit: 2023-01-13 | Discharge: 2023-01-13 | Disposition: A | Discharge status: Home / Self Care | Payer: PPO | Attending: Emergency Medicine | Admitting: Emergency Medicine

## 2023-01-13 ENCOUNTER — Encounter (HOSPITAL_BASED_OUTPATIENT_CLINIC_OR_DEPARTMENT_OTHER): Payer: Self-pay

## 2023-01-13 ENCOUNTER — Other Ambulatory Visit: Payer: Self-pay

## 2023-01-13 ENCOUNTER — Emergency Department (HOSPITAL_BASED_OUTPATIENT_CLINIC_OR_DEPARTMENT_OTHER): Payer: PPO

## 2023-01-13 DIAGNOSIS — I10 Essential (primary) hypertension: Secondary | ICD-10-CM | POA: Diagnosis not present

## 2023-01-13 DIAGNOSIS — E119 Type 2 diabetes mellitus without complications: Secondary | ICD-10-CM | POA: Diagnosis not present

## 2023-01-13 DIAGNOSIS — R0609 Other forms of dyspnea: Secondary | ICD-10-CM | POA: Diagnosis not present

## 2023-01-13 DIAGNOSIS — R600 Localized edema: Secondary | ICD-10-CM | POA: Insufficient documentation

## 2023-01-13 DIAGNOSIS — R0602 Shortness of breath: Secondary | ICD-10-CM | POA: Diagnosis not present

## 2023-01-13 LAB — BASIC METABOLIC PANEL
Anion gap: 10 (ref 5–15)
BUN: 23 mg/dL (ref 8–23)
CO2: 23 mmol/L (ref 22–32)
Calcium: 9.7 mg/dL (ref 8.9–10.3)
Chloride: 104 mmol/L (ref 98–111)
Creatinine, Ser: 1.08 mg/dL — ABNORMAL HIGH (ref 0.44–1.00)
GFR, Estimated: 52 mL/min — ABNORMAL LOW (ref >60)
Glucose, Bld: 224 mg/dL — ABNORMAL HIGH (ref 70–99)
Potassium: 4.5 mmol/L (ref 3.5–5.1)
Sodium: 137 mmol/L (ref 135–145)

## 2023-01-13 LAB — CBC WITH DIFFERENTIAL/PLATELET
Abs Immature Granulocytes: 0.03 10*3/uL (ref 0.00–0.07)
Basophils Absolute: 0.1 10*3/uL (ref 0.0–0.1)
Basophils Relative: 1 %
Eosinophils Absolute: 0.2 10*3/uL (ref 0.0–0.5)
Eosinophils Relative: 2 %
HCT: 36.2 % (ref 36.0–46.0)
Hemoglobin: 11.6 g/dL — ABNORMAL LOW (ref 12.0–15.0)
Immature Granulocytes: 0 %
Lymphocytes Relative: 36 %
Lymphs Abs: 3.4 10*3/uL (ref 0.7–4.0)
MCH: 27.6 pg (ref 26.0–34.0)
MCHC: 32 g/dL (ref 30.0–36.0)
MCV: 86.2 fL (ref 80.0–100.0)
Monocytes Absolute: 0.7 10*3/uL (ref 0.1–1.0)
Monocytes Relative: 7 %
Neutro Abs: 5.1 10*3/uL (ref 1.7–7.7)
Neutrophils Relative %: 54 %
Platelets: 317 10*3/uL (ref 150–400)
RBC: 4.2 MIL/uL (ref 3.87–5.11)
RDW: 14.7 % (ref 11.5–15.5)
WBC: 9.4 10*3/uL (ref 4.0–10.5)
nRBC: 0 % (ref 0.0–0.2)

## 2023-01-13 LAB — TROPONIN I (HIGH SENSITIVITY): Troponin I (High Sensitivity): 3 ng/L (ref <18)

## 2023-01-13 LAB — BRAIN NATRIURETIC PEPTIDE: B Natriuretic Peptide: 47.5 pg/mL (ref 0.0–100.0)

## 2023-01-13 MED ORDER — FUROSEMIDE 20 MG PO TABS: 20.0000 mg | ORAL_TABLET | Freq: Two times a day (BID) | ORAL | 0 refills | Status: DC

## 2023-01-13 MED ORDER — NITROGLYCERIN 0.4 MG SL SUBL
0.4000 mg | SUBLINGUAL_TABLET | Freq: Once | SUBLINGUAL | Status: AC
  Administered 2023-01-13: 0.4 mg via SUBLINGUAL
  Filled 2023-01-13: qty 1

## 2023-01-13 NOTE — ED Triage Notes (Signed)
BLE swelling for last several months. Says its usually related to work and improves with rest.   Recently becoming out of breath with exertion.

## 2023-01-13 NOTE — ED Provider Notes (Signed)
[place] EMERGENCY DEPARTMENT AT [facility]  Provider Note  CSN: [id number] Arrival date & time: 01/13/23 0016  History Chief Complaint  Patient presents with   Leg Swelling    Rachel [name] is a 82 y.o. female with history of HTN and DM reports several months of leg swelling, worse after being on her feet and improved with elevation. She has noticed increased SOB and DOE over the last few days, has to rest after going up stairs. No significant orthopnea but she sleeps on several pillows at baseline. No CP or fever. No prior history of CAD or CHF. Had an echo in 2019 for cardiomegaly showing LVH and EF 65-70%.    Home Medications Prior to Admission medications   Medication Sig Start Date End Date Taking? Authorizing Provider  furosemide (LASIX) 20 MG tablet Take 1 tablet (20 mg total) by mouth 2 (two) times daily for 5 days. 01/13/23 01/18/23 Yes Pollyann Savoy, MD  acetaminophen (TYLENOL) 500 MG tablet Take 1,000 mg by mouth 3 (three) times daily as needed.    [provider]  albuterol (VENTOLIN HFA) 108 (90 Base) MCG/ACT inhaler INHALE 1-2 PUFFS BY MOUTH EVERY 6 HOURS AS NEEDED FOR WHEEZE OR SHORTNESS OF BREATH 10/25/22   Rema Fendt, NP  amLODipine (NORVASC) 2.5 MG tablet Take 1 tablet (2.5 mg total) by mouth daily. 09/10/22 12/09/22  Rema Fendt, NP  atorvastatin (LIPITOR) 20 MG tablet Take 1 tablet (20 mg total) by mouth daily. 09/11/22 12/10/22  Rema Fendt, NP  baclofen (LIORESAL) 10 MG tablet Take 0.5-1 tablets (5-10 mg total) by mouth 3 (three) times daily as needed for muscle spasms. 01/12/21   Hilts, Casimiro Needle, MD  Blood Glucose Monitoring Suppl (ONETOUCH VERIO REFLECT) w/Device KIT 1 each by Does not apply route daily. E11.9    [provider]  cetirizine (ZYRTEC) 5 MG tablet Take 1 tablet (5 mg total) by mouth daily. 05/31/21   Wallis Bamberg, PA-C  diclofenac Sodium (VOLTAREN) 1 % GEL Apply 2 g topically 4 (four) times daily as  needed. 11/01/21   Chilton Si, MD  fluticasone [facility]) 50 MCG/ACT nasal spray Place 2 sprays into both nostrils daily. 06/14/22   Anders Simmonds, PA-C  gabapentin (NEURONTIN) 100 MG capsule Take 1 capsule (100 mg total) by mouth at bedtime. 08/11/21 09/10/21  Rema Fendt, NP  Glucose Blood (ONETOUCH VERIO VI) 1 each by Other route daily. E11.9    [provider]  glucose blood (ONETOUCH VERIO) [name] strip Use to check blood sugars once a day 06/15/22   Georganna Skeans, MD  ibuprofen (ADVIL) 800 MG tablet Take 1 tablet (800 mg total) by mouth every 8 (eight) hours as needed. 06/14/21   Georganna Skeans, MD  loratadine (CLARITIN) 10 MG tablet TAKE 1 TABLET BY MOUTH EVERY DAY (DRUG NOT COVERED BY INS) 01/06/23   Rema Fendt, NP  Multiple Vitamins-Minerals (HAIR SKIN AND NAILS FORMULA) TABS Take 1 tablet by mouth daily.    [provider]  nystatin (MYCOSTATIN/NYSTOP) powder Apply topically 4 (four) times daily. 03/17/19   Pincus Sanes, MD  nystatin cream (MYCOSTATIN) Apply 1 application topically 2 (two) times daily. 03/17/19   Pincus Sanes, MD  OneTouch Delica Lancets 30G MISC E11.9 06/15/22   Georganna Skeans, MD  pseudoephedrine (SUDAFED) 30 MG tablet Take 1 tablet (30 mg total) by mouth every 8 (eight) hours as needed for congestion. 05/31/21   Wallis Bamberg, PA-C  repaglinide [facility])  2 MG tablet Take 2 tablets (4 mg total) by mouth 2 (two) times daily before a meal. 09/10/22 12/09/22  Rema Fendt, NP  spironolactone (ALDACTONE) 25 MG tablet Take 1 tablet (25 mg total) by mouth daily. 09/10/22 12/09/22  Rema Fendt, NP  valsartan-hydrochlorothiazide (DIOVAN-HCT) 320-25 MG tablet Take 1 tablet by mouth daily. 09/10/22 12/09/22  Rema Fendt, NP     Allergies    Aspirin, Codeine sulfate, Glimepiride, Metformin, and Propoxyphene n-acetaminophen   Review of Systems   Review of Systems Please see HPI for pertinent positives and negatives  Physical Exam BP (!)  218/85 (BP Location: Left Arm)   Pulse 71   Temp (!) 97.5 F (36.4 C)   Resp (!) 24   Ht 5\' 3"  (1.6 m)   Wt 86.2 kg   SpO2 100%   BMI 33.66 kg/m   Physical Exam Vitals and nursing note reviewed.  Constitutional:      Appearance: Normal appearance.  HENT:     Head: Normocephalic and atraumatic.     Nose: Nose normal.     Mouth/Throat:     Mouth: Mucous membranes are moist.  Eyes:     Extraocular Movements: Extraocular movements intact.     Conjunctiva/sclera: Conjunctivae normal.  Cardiovascular:     Rate and Rhythm: Normal rate.  Pulmonary:     Effort: Pulmonary effort is normal.     Breath sounds: Normal breath sounds. No rales.  Abdominal:     General: Abdomen is flat.     Palpations: Abdomen is soft.     Tenderness: There is no abdominal tenderness.  Musculoskeletal:        General: No swelling. Normal range of motion.     Cervical back: Neck supple.     Right lower leg: Edema (2+ to mid lower leg) present.     Left lower leg: Edema (2+ to mid lower leg) present.  Skin:    General: Skin is warm and dry.  Neurological:     General: No focal deficit present.     Mental Status: She is alert.  Psychiatric:        Mood and Affect: Mood normal.     ED Results / Procedures / Treatments   EKG EKG Interpretation  Date/Time:  Sunday January 13 2023 00:28:56 EDT Ventricular Rate:  65 PR Interval:  168 QRS Duration: 92 QT Interval:  432 QTC Calculation: 449 R Axis:   50 Text Interpretation: Normal sinus rhythm Possible Anterior infarct , age undetermined Abnormal ECG No significant change since last tracing Confirmed by Susy Frizzle [phone]) on 01/13/2023 1:14:30 AM  Procedures Procedures  Medications Ordered in the ED Medications  nitroGLYCERIN (NITROSTAT) SL tablet 0.4 mg (0.4 mg Sublingual Given 01/13/23 0123)    Initial Impression and Plan  Patient here with LE Edema and SOB/DOE concerning for new CHF. She was noted to be hypertensive in triage but  otherwise in no distress. She reports compliance with her medications. Will check labs, CXR, EKG.   ED Course   Clinical Course as of 01/13/23 0257  Wynelle Link Jan 13, 2023  0130 I personally viewed the images from radiology studies and agree with radiologist interpretation: CXR shows cardiomegaly without frank edema [CS]  0158 NTG given for HTN in suspected CHF with improvement in BP.  [CS]  0255 BP continues to improved. Her symptoms seem to be minor, discussed inpatient vs outpatient management and she prefers to go home. Will give Rx for Lasix, recommend  rest, and close Cardiology follow up. RTED for any worsening breathing, CP or other concerns.  [CS]    Clinical Course User Index [CS] Pollyann Savoy, MD     MDM Rules/Calculators/A&P Medical Decision Making Given presenting complaint, I considered that admission might be necessary. After review of results from ED lab and/or imaging studies, admission to the hospital is not indicated at this time.    Problems Addressed: DOE (dyspnea on exertion): acute illness or injury Peripheral edema: chronic illness or injury with exacerbation, progression, or side effects of treatment Uncontrolled hypertension: chronic illness or injury with exacerbation, progression, or side effects of treatment  Amount and/or Complexity of Data Reviewed Labs: ordered. Decision-making details documented in ED Course. Radiology: ordered and independent interpretation performed. Decision-making details documented in ED Course. ECG/medicine tests: ordered and independent interpretation performed. Decision-making details documented in ED Course.  Risk Prescription drug management. Decision regarding hospitalization.     Final Clinical Impression(s) / ED Diagnoses Final diagnoses:  Peripheral edema  Uncontrolled hypertension  DOE (dyspnea on exertion)    Rx / DC Orders ED Discharge Orders          Ordered    Ambulatory referral to Cardiology        Comments: If you have not heard from the Cardiology office within the next 72 hours please call [phone].   01/13/23 0257    furosemide (LASIX) 20 MG tablet  2 times daily        01/13/23 0257             Pollyann Savoy, MD 01/13/23 [id number]

## 2023-01-15 NOTE — Progress Notes (Signed)
[facility] Follow-up:    Date:  01/17/2023   ID:  Rachel [name] [birth year], MRN [id number]  PCP:  Rema Fendt, NP  Cardiologist:  None  Nephrologist:  Referring MD: Pollyann Savoy, MD   CC: Hypertension  History of Present Illness:    Rachel [name] is a 82 y.o. female with a hx of hypertension, asthma, diabetes, here for follow-up. She was initially seen 11/01/2021 in the [facility]. She had a televisit 06/2021 and her blood pressure was 157/87 on amlodipine, losartan, and HCTZ. Amlodipine was increased to 10 mg. She previously had an Echo in 2019 which showed LVEF 65-70% and grade 1 diastolic dysfunction.  At her initial visit, she reported home blood pressures averaging 160-187 systolic prior to taking her medications. She had mildly elevated neck veins on exam. She had no symptoms of pheochromocytoma. Given that she was on ARB we did not check for hyperaldosteronism. We restarted her on 5 mg amlodipine daily (previously intolerant of 10 mg dose but was stable on 5 mg). Losartan/HCTZ was switched to valsartan/HCTZ 320/25 mg daily. She was also started on spironolactone 25 mg daily. She was referred to [facility]. Renal artery doppler and echocardiogram were ordered but not completed. Later she reported significant LE swelling, so amlodipine was reduced from 5 mg to 2.5 mg daily. On 01/13/2023 she presented to the ED with complaints of worsening DOE for several days in the setting of months of leg swelling. Noted to be hypertensive in triage but otherwise without distress. BNP was normal. She was discharged on 20 mg furosemide BID.    Today, she is accompanied by her daughter and feeling good. She reports that her BP was elevated to 210 systolic while in the hospital. At home her BP readings have been averaging 165-167 systolic. Of note, there has been some confusion with her medications. At one point during everything that has  happened her valsartan/HCTZ was lost from her regimen. Her cholesterol medication was not refilled; she was hesitant after hearing about potential side effects. She complains of LE swelling for 6 weeks. She works 4-5 hours a day on her feet. She doesn't wear compression socks but notes she is able to sit as needed. On the days she doesn't work her swelling does improve. Additionally, she becomes fatigued easily. She is frequently going up and down stairs in her 3 level home. Her daughter has noticed that she is short of breath sometimes.  While lying on her back, she starts coughing and is forced to sit up. She dislikes salt and tries to avoid it in her meals. She denies any palpitations, chest pain, lightheadedness, headaches, syncope, or PND.  Previous antihypertensives: Amlodipine-increased edema at higher doses  Past Medical History:  Diagnosis Date   (HFpEF) heart failure with preserved ejection fraction 01/17/2023   Allergic rhinitis    Diabetes mellitus, type 2    Hyperlipemia    Hypertension    Left shoulder pain 11/01/2021   Lipoma NEC    anterior upper chest   Osteoarthritis of knee    Shortness of breath 11/01/2021    Past Surgical History:  Procedure Laterality Date   ACNE CYST REMOVAL  1970   back   APPENDECTOMY  1963   OOPHORECTOMY  1985   VAGINAL HYSTERECTOMY  1985   complete    Current Medications: Current Meds  Medication Sig   acetaminophen (TYLENOL) 500 MG tablet Take 1,000 mg by mouth 3 (three) times daily  as needed.   albuterol (VENTOLIN HFA) 108 (90 Base) MCG/ACT inhaler INHALE 1-2 PUFFS BY MOUTH EVERY 6 HOURS AS NEEDED FOR WHEEZE OR SHORTNESS OF BREATH   amLODipine (NORVASC) 2.5 MG tablet Take 1 tablet (2.5 mg total) by mouth daily.   Blood Glucose Monitoring Suppl (ONETOUCH VERIO REFLECT) w/Device KIT 1 each by Does not apply route daily. E11.9   cetirizine (ZYRTEC) 5 MG tablet Take 1 tablet (5 mg total) by mouth daily.   fluticasone (FLONASE) 50 MCG/ACT  nasal spray Place 2 sprays into both nostrils daily.   furosemide (LASIX) 20 MG tablet Take 1 tablet (20 mg total) by mouth 2 (two) times daily for 5 days.   Glucose Blood (ONETOUCH VERIO VI) 1 each by Other route daily. E11.9   glucose blood (ONETOUCH VERIO) test strip Use to check blood sugars once a day   ibuprofen (ADVIL) 800 MG tablet Take 1 tablet (800 mg total) by mouth every 8 (eight) hours as needed.   loratadine (CLARITIN) 10 MG tablet TAKE 1 TABLET BY MOUTH EVERY DAY (DRUG NOT COVERED BY INS)   Multiple Vitamins-Minerals (HAIR SKIN AND NAILS FORMULA) TABS Take 1 tablet by mouth daily.   nystatin (MYCOSTATIN/NYSTOP) powder Apply topically 4 (four) times daily.   nystatin cream (MYCOSTATIN) Apply 1 application topically 2 (two) times daily.   OneTouch Delica Lancets 30G MISC E11.9   pseudoephedrine (SUDAFED) 30 MG tablet Take 1 tablet (30 mg total) by mouth every 8 (eight) hours as needed for congestion.   repaglinide (PRANDIN) 2 MG tablet Take 2 tablets (4 mg total) by mouth 2 (two) times daily before a meal.   spironolactone (ALDACTONE) 25 MG tablet Take 1 tablet (25 mg total) by mouth daily.     Allergies:   Aspirin, Codeine sulfate, Glimepiride, Metformin, and Propoxyphene n-acetaminophen   Social History   Socioeconomic History   Marital status: Widowed    Spouse name: Not on file   Number of children: Not on file   Years of education: Not on file   Highest education level: Not on file  Occupational History   Occupation: department mang    Employer: [employer]    Comment: Retired  Tobacco Use   Smoking status: Never    Passive exposure: Never   Smokeless tobacco: Never  Vaping Use   Vaping Use: Never used  Substance and Sexual Activity   Alcohol use: Yes    Alcohol/week: 1.0 standard drink of alcohol    Types: 1 Glasses of wine per week    Comment: ocassionally   Drug use: No   Sexual activity: Not Currently  Other Topics Concern   Not on file  Social  History Narrative   No regular exercise   Widowed   Social Determinants of Health   Financial Resource Strain: Low Risk  (11/01/2021)   Overall Financial Resource Strain (CARDIA)    Difficulty of Paying Living Expenses: Not hard at all  Food Insecurity: No Food Insecurity (11/01/2021)   Hunger Vital Sign    Worried About Running Out of Food in the Last Year: Never true    Ran Out of Food in the Last Year: Never true  Transportation Needs: No Transportation Needs (11/01/2021)   PRAPARE - Administrator, Civil Service (Medical): No    Lack of Transportation (Non-Medical): No  Physical Activity: Inactive (11/01/2021)   Exercise Vital Sign    Days of Exercise per Week: 0 days    Minutes of Exercise  per Session: 0 min  Stress: Not on file  Social Connections: Not on file     Family History: The patient's family history includes Aneurysm in an other family member; Cancer in her father; Coronary artery disease in her brother; Diabetes in her brother, mother, and sister; Heart attack in her mother; Hypertension in her brother and sister.  ROS:   Please see the history of present illness.    (+) BLE edema (+) Fatigue (+) Shortness of breath (+) Orthopnea/Cough All other systems reviewed and are negative.  EKGs/Labs/Other Studies Reviewed:    Chest X-Ray  01/13/2023: IMPRESSION: 1. Mild cardiomegaly and increased central vascular prominence without overt edema. 2. No other evidence for acute chest process. Linear scarring or atelectasis left mid field.  Echo 01/09/2018: Study Conclusions  - Left ventricle: Wall thickness was increased in a pattern of    moderate LVH. Systolic function was vigorous. The estimated    ejection fraction was in the range of 65% to 70%. Wall motion was    normal; there were no regional wall motion abnormalities. Doppler    parameters are consistent with abnormal left ventricular    relaxation (grade 1 diastolic dysfunction).  - Aortic valve:  Peak gradient 18 mmHg across the aortic valve    likely represents LV outflow tract gradient rather than valvular    AS, the valve appears to open well. Peak gradient (S): 18 mm Hg.  - Mitral valve: There was mild regurgitation.  - Right ventricle: The cavity size was normal. Systolic function    was normal.  - Tricuspid valve: Peak RV-RA gradient (S): 37 mm Hg.  - Pulmonary arteries: PA peak pressure: 40 mm Hg (S).  - Inferior vena cava: The vessel was normal in size. The    respirophasic diameter changes were in the normal range (>= 50%),    consistent with normal central venous pressure.   Impressions:  - Normal LV size with moderate LV hypertrophy. EF 65-70%, vigorous    systolic function. Normal RV size and systolic function. Mild    gradient across the LV outflow tract is likely due to    LVH/vigorous LV systolic function rather than valvular AS.   EKG:  EKG is personally reviewed. 01/17/2023:  EKG was not ordered. 11/01/2021: Sinus rhythm. Rate 66 bpm.  Recent Labs: 09/10/2022: ALT 18; TSH 1.750 01/13/2023: B Natriuretic Peptide 47.5; BUN 23; Creatinine, Ser 1.08; Hemoglobin 11.6; Platelets 317; Potassium 4.5; Sodium 137   Recent Lipid Panel    Component Value Date/Time   CHOL 223 (H) 09/10/2022 1554   TRIG 196 (H) 09/10/2022 1554   HDL 42 09/10/2022 1554   CHOLHDL 5.3 (H) 09/10/2022 1554   CHOLHDL 5 01/12/2020 1350   VLDL 45.2 (H) 01/12/2020 1350   LDLCALC 145 (H) 09/10/2022 1554   LDLDIRECT 139.0 01/12/2020 1350    Physical Exam:    VS:  BP (!) 164/72 (BP Location: Left Arm, Patient Position: Sitting, Cuff Size: Large)   Pulse 62   Ht 5\' 3"  (1.6 m)   Wt 200 lb 9.6 oz (91 kg)   SpO2 98%   BMI 35.53 kg/m  , BMI Body mass index is 35.53 kg/m. GENERAL:  Well appearing HEENT: Pupils equal round and reactive, fundi not visualized, oral mucosa unremarkable NECK: No JVP.  Waveform within normal limits, carotid upstroke brisk and symmetric, no bruits, no  thyromegaly LUNGS:  Clear to auscultation bilaterally HEART:  RRR.  PMI not displaced or sustained,S1 and S2 within normal  limits, no S3, no S4, no clicks, no rubs, no murmurs ABD:  Flat, positive bowel sounds normal in frequency in pitch, no bruits, no rebound, no guarding, no midline pulsatile mass, no hepatomegaly, no splenomegaly EXT:  2 plus pulses throughout, 1+ LE edema bilaterally, no cyanosis no clubbing SKIN:  No rashes no nodules NEURO:  Cranial nerves II through XII grossly intact, motor grossly intact throughout PSYCH:  Cognitively intact, oriented to person place and time   ASSESSMENT/PLAN:    No problem-specific Assessment & Plan notes found for this encounter.  # Hypertension: - Restart amlodipine, spironolactone, and hydrochlorothiazide with valsartan combination pill. - Patient to monitor blood pressure at home and record readings. - Schedule follow-up appointment in one month for blood pressure management. - Obtain basic metabolic panel in one week to assess kidney function and electrolytes.  # Hyperlipidemia: - Restart atorvastatin for cholesterol management. - Educate patient on potential side effects and benefits of statin therapy.  # HFpEF: # Lower extremity edema: - Discontinue hospital-prescribed diuretics. - Monitor for improvement with the resumption of blood pressure medications containing diuretics (spironolactone and hydrochlorothiazide). - Echo   Screening for Secondary Hypertension:     11/01/2021   10:31 AM  Causes  Drugs/Herbals Screened     - Comments Limits salt and caffeine.  No EtOH.  Rare NSAID use.  Renovascular HTN Screened     - Comments Check renal artery Dopplers  Sleep Apnea Screened     - Comments Snoring and orthopnea.  No other symptoms.  We will get an echo and if it is unremarkable consider screening for sleep study.  Thyroid Disease Screened     - Comments Check TSH  Hyperaldosteronism Not Screened     - Comments Currently  on ARB.  Therefore will not start at this time.  Pheochromocytoma Screened     - Comments No symptoms  Cushing's Syndrome Not Screened  Hyperparathyroidism Screened  Coarctation of the Aorta Screened     - Comments BP symmetric  Compliance Screened    Relevant Labs/Studies:    Latest Ref Rng & Units 01/13/2023   12:32 AM 09/10/2022    3:54 PM 01/12/2020    1:50 PM  Basic Labs  Sodium 135 - 145 mmol/L 137  140  133   Potassium 3.5 - 5.1 mmol/L 4.5  5.2  4.6   Creatinine 0.44 - 1.00 mg/dL 1.32  4.40  1.02        Latest Ref Rng & Units 09/10/2022    3:54 PM 01/12/2020    1:50 PM  Thyroid   TSH 0.450 - 4.500 uIU/mL 1.750  1.84       Disposition:    FU with APP/PharmD in 1 month. FU with Neely Cecena C. Duke Salvia, MD, [facility] in 6 months.  Medication Adjustments/Labs and Tests Ordered: Current medicines are reviewed at length with the patient today.  Concerns regarding medicines are outlined above.   Orders Placed This Encounter  Procedures   Basic metabolic panel   ECHOCARDIOGRAM COMPLETE   Meds ordered this encounter  Medications   DISCONTD: atorvastatin (LIPITOR) 20 MG tablet    Sig: Take 1 tablet (20 mg total) by mouth daily.    Dispense:  90 tablet    Refill:  3   DISCONTD: valsartan-hydrochlorothiazide (DIOVAN-HCT) 320-25 MG tablet    Sig: Take 1 tablet by mouth daily.    Dispense:  90 tablet    Refill:  3   atorvastatin (LIPITOR) 20 MG tablet  Sig: Take 1 tablet (20 mg total) by mouth daily.    Dispense:  90 tablet    Refill:  3   valsartan-hydrochlorothiazide (DIOVAN-HCT) 320-25 MG tablet    Sig: Take 1 tablet by mouth daily.    Dispense:  90 tablet    Refill:  3   I,Mathew Stumpf,acting as a scribe for Chilton Si, MD.,have documented all relevant documentation on the behalf of Chilton Si, MD,as directed by  Chilton Si, MD while in the presence of Chilton Si, MD.  I, Tavarious Freel C. Duke Salvia, MD have reviewed all documentation for this visit.   The documentation of the exam, diagnosis, procedures, and orders on 01/17/2023 are all accurate and complete.  Signed, Chilton Si, MD  01/17/2023 12:27 PM    [facility]

## 2023-01-17 ENCOUNTER — Encounter (HOSPITAL_BASED_OUTPATIENT_CLINIC_OR_DEPARTMENT_OTHER): Payer: Self-pay | Admitting: *Deleted

## 2023-01-17 ENCOUNTER — Encounter (HOSPITAL_BASED_OUTPATIENT_CLINIC_OR_DEPARTMENT_OTHER): Payer: Self-pay | Admitting: Cardiovascular Disease

## 2023-01-17 ENCOUNTER — Telehealth: Payer: Self-pay

## 2023-01-17 ENCOUNTER — Telehealth (HOSPITAL_BASED_OUTPATIENT_CLINIC_OR_DEPARTMENT_OTHER): Payer: Self-pay | Admitting: *Deleted

## 2023-01-17 ENCOUNTER — Ambulatory Visit (HOSPITAL_BASED_OUTPATIENT_CLINIC_OR_DEPARTMENT_OTHER): Payer: PPO | Admitting: Cardiovascular Disease

## 2023-01-17 ENCOUNTER — Other Ambulatory Visit (HOSPITAL_BASED_OUTPATIENT_CLINIC_OR_DEPARTMENT_OTHER): Payer: Self-pay

## 2023-01-17 VITALS — BP 164/72 | HR 62 | Ht 63.0 in | Wt 200.6 lb

## 2023-01-17 DIAGNOSIS — E785 Hyperlipidemia, unspecified: Secondary | ICD-10-CM | POA: Diagnosis not present

## 2023-01-17 DIAGNOSIS — I10 Essential (primary) hypertension: Secondary | ICD-10-CM | POA: Diagnosis not present

## 2023-01-17 DIAGNOSIS — I5033 Acute on chronic diastolic (congestive) heart failure: Secondary | ICD-10-CM | POA: Diagnosis not present

## 2023-01-17 DIAGNOSIS — Z5181 Encounter for therapeutic drug level monitoring: Secondary | ICD-10-CM | POA: Diagnosis not present

## 2023-01-17 DIAGNOSIS — R0602 Shortness of breath: Secondary | ICD-10-CM

## 2023-01-17 DIAGNOSIS — I5030 Unspecified diastolic (congestive) heart failure: Secondary | ICD-10-CM

## 2023-01-17 HISTORY — DX: Unspecified diastolic (congestive) heart failure: I50.30

## 2023-01-17 MED ORDER — VALSARTAN-HYDROCHLOROTHIAZIDE 320-25 MG PO TABS
1.0000 | ORAL_TABLET | Freq: Every day | ORAL | 3 refills | Status: DC
Start: 2023-01-17 — End: 2023-01-17

## 2023-01-17 MED ORDER — ATORVASTATIN CALCIUM 20 MG PO TABS
20.0000 mg | ORAL_TABLET | Freq: Every day | ORAL | 3 refills | Status: DC
  Filled 2023-01-17 (×2): qty 90, 90d supply, fill #0

## 2023-01-17 MED ORDER — ATORVASTATIN CALCIUM 20 MG PO TABS
20.0000 mg | ORAL_TABLET | Freq: Every day | ORAL | 3 refills | Status: DC
Start: 2023-01-17 — End: 2023-01-17

## 2023-01-17 MED ORDER — VALSARTAN-HYDROCHLOROTHIAZIDE 320-25 MG PO TABS
1.0000 | ORAL_TABLET | Freq: Every day | ORAL | 3 refills | Status: DC
  Filled 2023-01-17 (×2): qty 90, 90d supply, fill #0

## 2023-01-17 NOTE — Telephone Encounter (Signed)
     Patient  visit on 01/13/2023  at [facility] was for leg swelling.  Have you been able to follow up with your primary care physician?Yes  The patient was or was not able to obtain any needed medicine or equipment. Patient was able to obtain medication.  Are there diet recommendations that you are having difficulty following? No  Patient expresses understanding of discharge instructions and education provided has no other needs at this time. Yes   Malone Vanblarcom [facility]  [facility] Population [facility] Community Resource Care Guide   [email or web address]  ?? [phone]   Website: [email or web address]  [email or web address]

## 2023-01-17 NOTE — Telephone Encounter (Signed)
Patient seen by Dr Duke Salvia for visit today  Dr Duke Salvia took her out for 2 weeks however if she is not feeling better at that point ok to extend until after echo and follow up

## 2023-01-17 NOTE — Patient Instructions (Addendum)
Medication Instructions:  RESUME ATORVASTATIN DAILY   RESUME VALSARTAN HCT DAILY   Labwork: BMET IN 1 WEEK   Testing/Procedures: Your physician has requested that you have an echocardiogram. Echocardiography is a painless test that uses sound waves to create images of your heart. It provides your doctor with information about the size and shape of your heart and how well your heart's chambers and valves are working. This procedure takes approximately one hour. There are no restrictions for this procedure. Please do NOT wear cologne, perfume, aftershave, or lotions (deodorant is allowed). Please arrive 15 minutes prior to your appointment time.   Follow-Up: 02/13/2023 10:00 AM FOR ECHO AT [facility] OFFICE  02/20/2023 2:30 PM WITH PHARM D AT [facility]  07/17/2023 9:00 AM WITH DR Catharine AT [facility] OFFICE   If you need a refill on your cardiac medications before your next appointment, please call your pharmacy.  MONITOR BLOOD PRESSURE DAILY AT HOME, BRING READINGS TO FOLLOW UP IN 1 MONTH

## 2023-01-24 DIAGNOSIS — Z5181 Encounter for therapeutic drug level monitoring: Secondary | ICD-10-CM | POA: Diagnosis not present

## 2023-01-24 DIAGNOSIS — I10 Essential (primary) hypertension: Secondary | ICD-10-CM | POA: Diagnosis not present

## 2023-01-24 LAB — BASIC METABOLIC PANEL
BUN/Creatinine Ratio: 23 (ref 12–28)
BUN: 28 mg/dL — ABNORMAL HIGH (ref 8–27)
CO2: 20 mmol/L (ref 20–29)
Calcium: 10 mg/dL (ref 8.7–10.3)
Chloride: 104 mmol/L (ref 96–106)
Creatinine, Ser: 1.22 mg/dL — ABNORMAL HIGH (ref 0.57–1.00)
Glucose: 179 mg/dL — ABNORMAL HIGH (ref 70–99)
Potassium: 5.5 mmol/L — ABNORMAL HIGH (ref 3.5–5.2)
Sodium: 142 mmol/L (ref 134–144)
eGFR: 45 mL/min/{1.73_m2} — ABNORMAL LOW (ref >59)

## 2023-02-04 IMAGING — DX DG CERVICAL SPINE COMPLETE 4+V
6 series · 6 of 6 positions shown · non-contrast
Comparison: 02/26/2012

CLINICAL DATA: Neck pain

EXAM:
CERVICAL SPINE - COMPLETE 4+ VIEW

[cervical spine lat]
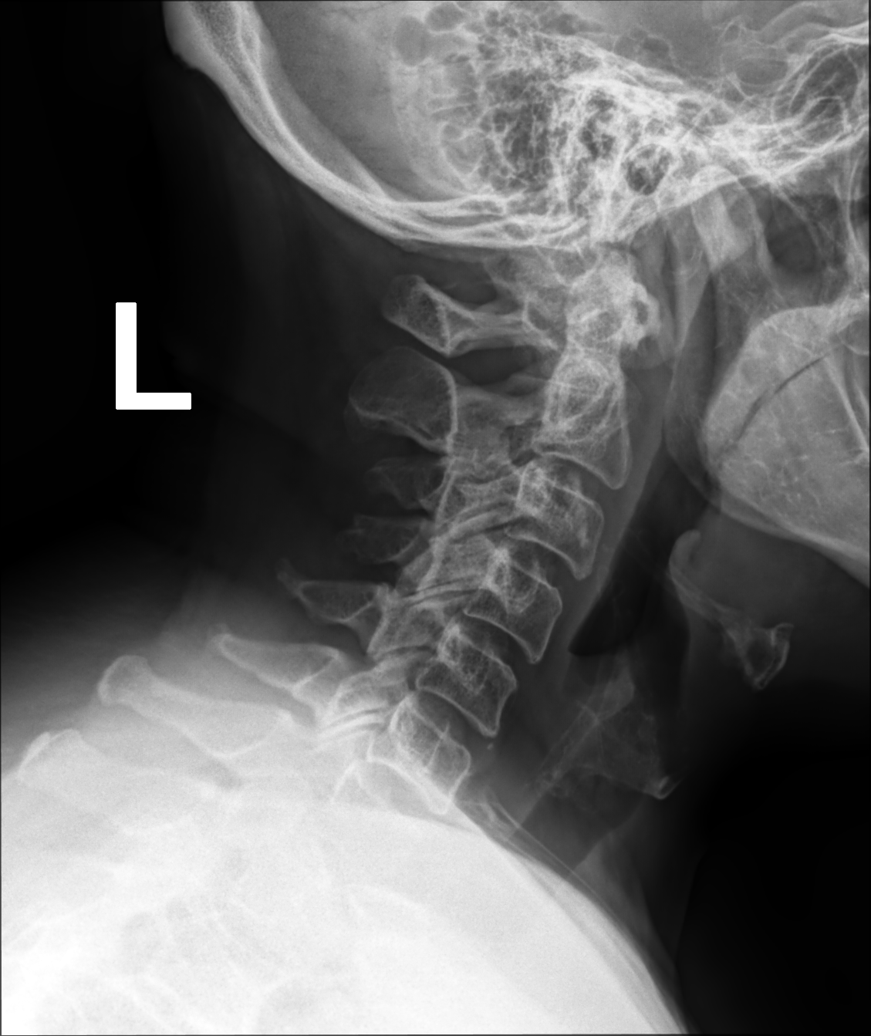

[cervical spine oblique (1 of 2)]
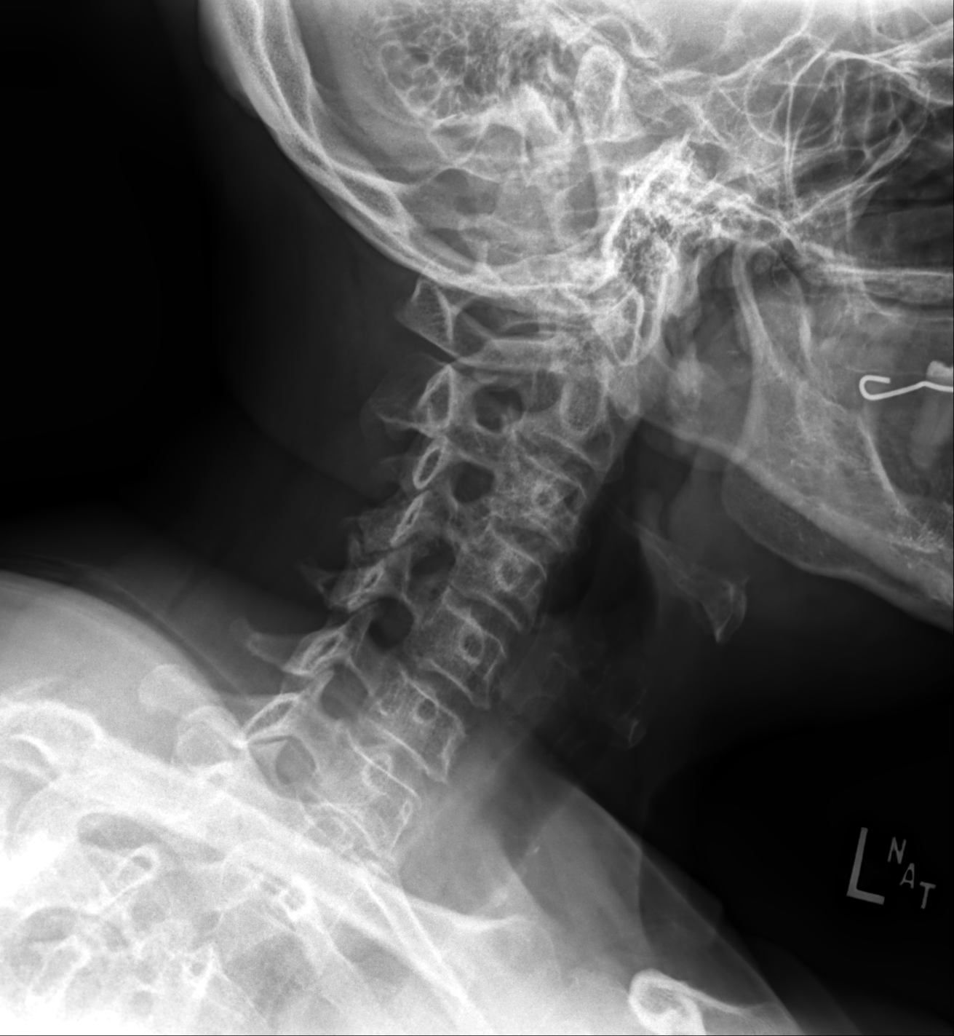

[cervical spine oblique (2 of 2)]
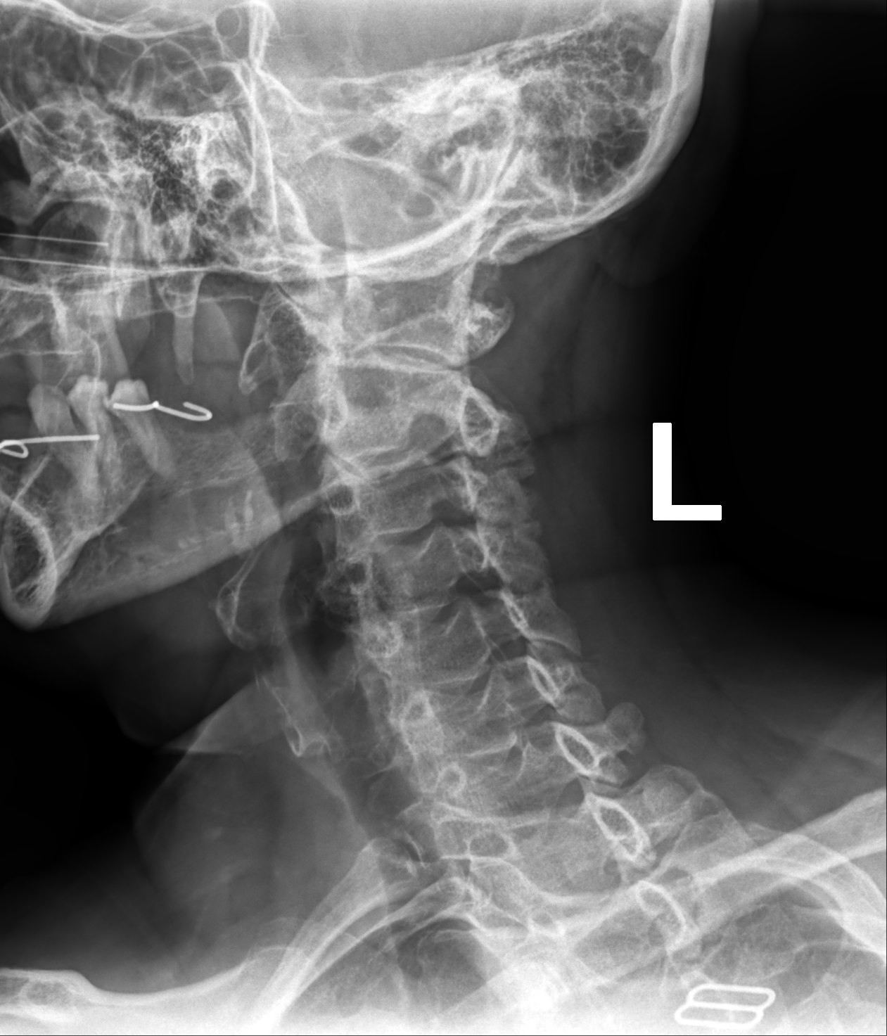

[cervical spine ap]
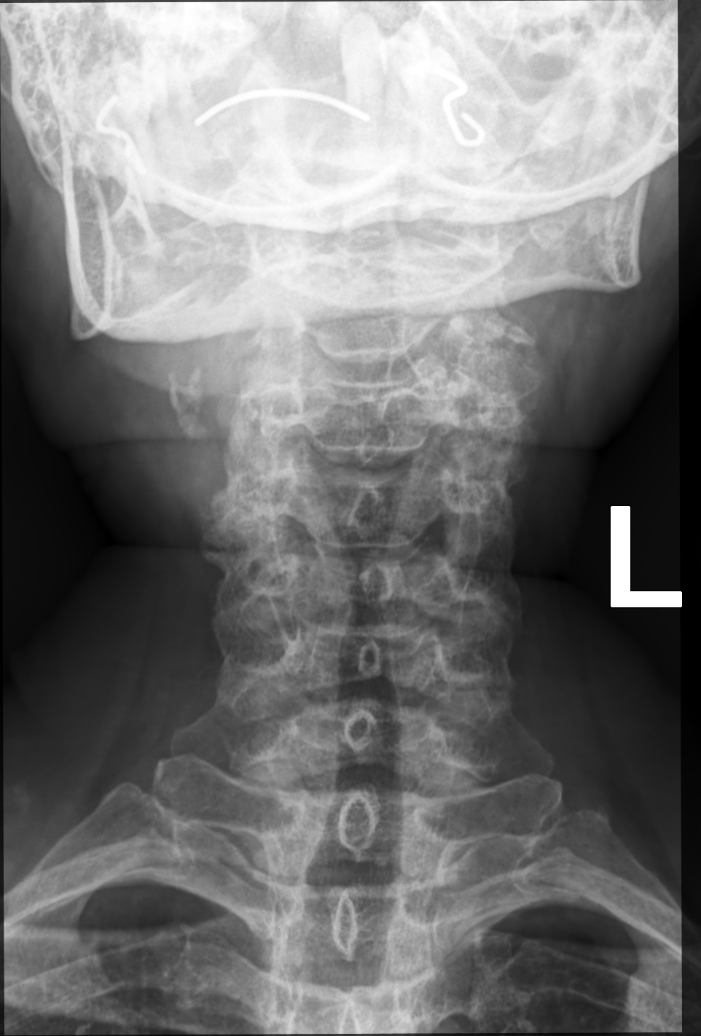

[cervical spine open mouth]
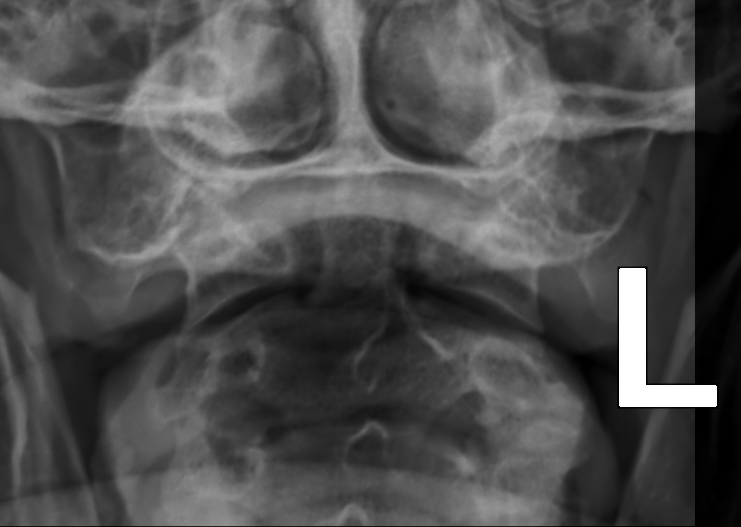

[cervical spine lat swimmer]
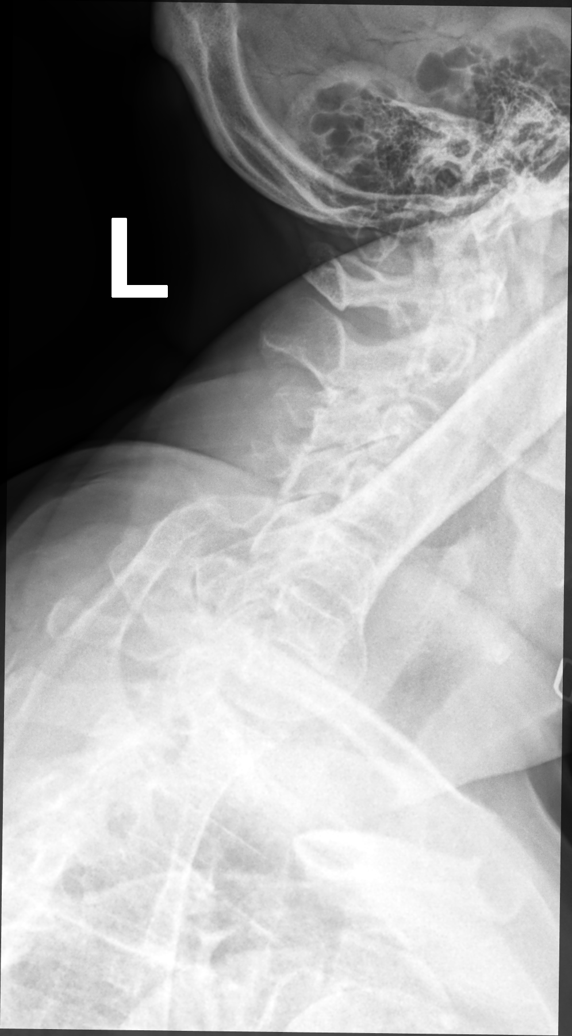

[6 of 6 positions shown; findings below may reference images not displayed]

FINDINGS: Seven cervical segments are well visualized. Vertebral body height
is well maintained. No anterolisthesis is seen. No soft tissue
abnormality is noted. Mild facet hypertrophic changes are seen. No
significant neural foraminal changes are noted. The odontoid is
within normal limits. No other focal abnormality is seen.
IMPRESSION: Degenerative change without acute abnormality.

## 2023-02-05 DIAGNOSIS — H43812 Vitreous degeneration, left eye: Secondary | ICD-10-CM | POA: Diagnosis not present

## 2023-02-05 DIAGNOSIS — H25813 Combined forms of age-related cataract, bilateral: Secondary | ICD-10-CM | POA: Diagnosis not present

## 2023-02-05 DIAGNOSIS — H35033 Hypertensive retinopathy, bilateral: Secondary | ICD-10-CM | POA: Diagnosis not present

## 2023-02-05 DIAGNOSIS — H40023 Open angle with borderline findings, high risk, bilateral: Secondary | ICD-10-CM | POA: Diagnosis not present

## 2023-02-05 DIAGNOSIS — E113413 Type 2 diabetes mellitus with severe nonproliferative diabetic retinopathy with macular edema, bilateral: Secondary | ICD-10-CM | POA: Diagnosis not present

## 2023-02-13 ENCOUNTER — Other Ambulatory Visit (HOSPITAL_BASED_OUTPATIENT_CLINIC_OR_DEPARTMENT_OTHER): Payer: PPO

## 2023-02-13 DIAGNOSIS — E113412 Type 2 diabetes mellitus with severe nonproliferative diabetic retinopathy with macular edema, left eye: Secondary | ICD-10-CM | POA: Diagnosis not present

## 2023-02-13 DIAGNOSIS — H2513 Age-related nuclear cataract, bilateral: Secondary | ICD-10-CM | POA: Diagnosis not present

## 2023-02-13 DIAGNOSIS — E113411 Type 2 diabetes mellitus with severe nonproliferative diabetic retinopathy with macular edema, right eye: Secondary | ICD-10-CM | POA: Diagnosis not present

## 2023-02-20 ENCOUNTER — Ambulatory Visit: Payer: PPO | Attending: Family

## 2023-02-20 NOTE — Progress Notes (Deleted)
Office Visit    Patient Name: Rachel [name] Date of Encounter: 02/20/2023  Primary Care Provider:  Rema Fendt, NP Primary Cardiologist:  None  Chief Complaint    Hypertension - Advanced hypertension clinic  Past Medical History                    Allergies  Allergen Reactions   Aspirin Nausea And Vomiting    Uncoated aspirin Jittery GI upset   Codeine Sulfate Nausea And Vomiting    Heart races.   Glimepiride Diarrhea   Metformin Diarrhea   Propoxyphene N-Acetaminophen Nausea And Vomiting    History of Present Illness    Rachel [name] is a 82 y.o. female patient who was referred to the Advanced Hypertension Clinic   Blood Pressure Goal:  130/80  Current Medications:   Adherence Assessment  Do you ever forget to take your medication? [] Yes [] No  Do you ever skip doses due to side effects? [] Yes [] No  Do you have trouble affording your medicines? [] Yes [] No  Are you ever unable to pick up your medication due to transportation difficulties? [] Yes [] No  Do you ever stop taking your medications because you don't believe they are helping? [] Yes [] No  Do you check your weight daily? [] Yes [] No   Adherence strategy: ***  Barriers to obtaining medications: ***  Previously tried:     Family Hx:     Social Hx:      Tobacco:  Alcohol:  Caffeine:    Diet:      Exercise:   Home BP readings:       Accessory Clinical Findings    Lab Results  Component Value Date   CREATININE 1.22 (H) 01/24/2023   BUN 28 (H) 01/24/2023   NA 142 01/24/2023   K 5.5 (H) 01/24/2023   CL 104 01/24/2023   CO2 20 01/24/2023   Lab Results  Component Value Date   ALT 18 09/10/2022   AST 15 09/10/2022   ALKPHOS 78 09/10/2022   BILITOT 0.4 09/10/2022   Lab Results  Component Value Date   HGBA1C 8.4 (A) 09/10/2022    Screening for Secondary Hypertension: { Click here to document screening for secondary causes of HTN  :1}      11/01/2021   10:31 AM  Causes  Drugs/Herbals Screened     - Comments Limits salt and caffeine.  No EtOH.  Rare NSAID use.  Renovascular HTN Screened     - Comments Check renal artery Dopplers  Sleep Apnea Screened     - Comments Snoring and orthopnea.  No other symptoms.  We will get an echo and if it is unremarkable consider screening for sleep study.  Thyroid Disease Screened     - Comments Check TSH  Hyperaldosteronism Not Screened     - Comments Currently on ARB.  Therefore will not start at this time.  Pheochromocytoma Screened     - Comments No symptoms  Cushing's Syndrome Not Screened  Hyperparathyroidism Screened  Coarctation of the Aorta Screened     - Comments BP symmetric  Compliance Screened    Relevant Labs/Studies:    Latest Ref Rng & Units 01/24/2023   11:17 AM 01/13/2023   12:32 AM 09/10/2022    3:54 PM  Basic Labs  Sodium 134 - 144 mmol/L 142  137  140   Potassium 3.5 - 5.2 mmol/L 5.5  4.5  5.2   Creatinine 0.57 - 1.00 mg/dL 1.61  0.96  0.94        Latest Ref Rng & Units 09/10/2022    3:54 PM 01/12/2020    1:50 PM  Thyroid   TSH 0.450 - 4.500 uIU/mL 1.750  1.84                   Home Medications    Current Outpatient Medications  Medication Sig Dispense Refill   acetaminophen (TYLENOL) 500 MG tablet Take 1,000 mg by mouth 3 (three) times daily as needed.     albuterol (VENTOLIN HFA) 108 (90 Base) MCG/ACT inhaler INHALE 1-2 PUFFS BY MOUTH EVERY 6 HOURS AS NEEDED FOR WHEEZE OR SHORTNESS OF BREATH 18 each 1   amLODipine (NORVASC) 2.5 MG tablet Take 1 tablet (2.5 mg total) by mouth daily. 30 tablet 2   atorvastatin (LIPITOR) 20 MG tablet Take 1 tablet (20 mg total) by mouth daily. 90 tablet 3   Blood Glucose Monitoring Suppl (ONETOUCH VERIO REFLECT) w/Device KIT 1 each by Does not apply route daily. E11.9     cetirizine (ZYRTEC) 5 MG tablet Take 1 tablet (5 mg total) by mouth daily. 90 tablet 0   diclofenac Sodium (VOLTAREN) 1 % GEL Apply 2 g topically 4  (four) times daily as needed. (Patient not taking: Reported on 01/17/2023) 50 g 1   fluticasone (FLONASE) 50 MCG/ACT nasal spray Place 2 sprays into both nostrils daily. 16 g 12   furosemide (LASIX) 20 MG tablet Take 1 tablet (20 mg total) by mouth 2 (two) times daily for 5 days. 10 tablet 0   Glucose Blood (ONETOUCH VERIO VI) 1 each by Other route daily. E11.9     glucose blood (ONETOUCH VERIO) test strip Use to check blood sugars once a day 100 strip 5   ibuprofen (ADVIL) 800 MG tablet Take 1 tablet (800 mg total) by mouth every 8 (eight) hours as needed. 30 tablet 1   loratadine (CLARITIN) 10 MG tablet TAKE 1 TABLET BY MOUTH EVERY DAY (DRUG NOT COVERED BY INS) 90 tablet 0   Multiple Vitamins-Minerals (HAIR SKIN AND NAILS FORMULA) TABS Take 1 tablet by mouth daily.     nystatin (MYCOSTATIN/NYSTOP) powder Apply topically 4 (four) times daily. 60 g 0   nystatin cream (MYCOSTATIN) Apply 1 application topically 2 (two) times daily. 30 g 1   OneTouch Delica Lancets 30G MISC E11.9 100 each 3   pseudoephedrine (SUDAFED) 30 MG tablet Take 1 tablet (30 mg total) by mouth every 8 (eight) hours as needed for congestion. 30 tablet 0   repaglinide (PRANDIN) 2 MG tablet Take 2 tablets (4 mg total) by mouth 2 (two) times daily before a meal. 120 tablet 2   spironolactone (ALDACTONE) 25 MG tablet Take 1 tablet (25 mg total) by mouth daily. 30 tablet 2   valsartan-hydrochlorothiazide (DIOVAN-HCT) 320-25 MG tablet Take 1 tablet by mouth daily. 90 tablet 3   No current facility-administered medications for this visit.     Assessment & Plan   No BP recorded.  {Refresh Note OR Click here to enter BP  :1}***   No problem-specific Assessment & Plan notes found for this encounter.   Phillips Hay PharmD CPP [facility]  [address] [place], [place] [address] [phone]

## 2023-02-21 ENCOUNTER — Encounter: Payer: Self-pay | Admitting: Student

## 2023-02-21 ENCOUNTER — Ambulatory Visit
Admission: EM | Admit: 2023-02-21 | Discharge: 2023-02-21 | Disposition: A | Discharge status: Home / Self Care | Payer: PPO | Attending: Family Medicine | Admitting: Family Medicine

## 2023-02-21 DIAGNOSIS — J4521 Mild intermittent asthma with (acute) exacerbation: Secondary | ICD-10-CM | POA: Diagnosis not present

## 2023-02-21 DIAGNOSIS — J0100 Acute maxillary sinusitis, unspecified: Secondary | ICD-10-CM

## 2023-02-21 MED ORDER — PREDNISONE 20 MG PO TABS: 40.0000 mg | ORAL_TABLET | Freq: Every day | ORAL | 0 refills | Status: DC

## 2023-02-21 MED ORDER — LORATADINE 10 MG PO TABS
10.0000 mg | ORAL_TABLET | Freq: Every day | ORAL | 0 refills | Status: DC | PRN
Start: 2023-02-21 — End: 2023-05-20

## 2023-02-21 NOTE — ED Provider Notes (Signed)
[facility]    CSN: [id number] Arrival date & time: 02/21/23  1103      History   Chief Complaint Chief Complaint  Patient presents with   Asthma    HPI Rachel [name] is a 82 y.o. female.    Asthma   Here for an episode of shortness of breath and wheezing. Earlier today she had ridden in a car with the windows down and feels that she may have some pollen she experienced an episode of tight cough and brought up some mucus and heard wheezing and felt short of breath.  She then used her inhaler, albuterol, and feel better.  She is here just to make sure that she does not need to do any other therapies.  She does have a history of diabetes and her sugar yesterday was 206   Past Medical History:  Diagnosis Date   (HFpEF) heart failure with preserved ejection fraction (HCC) 01/17/2023   Allergic rhinitis    Diabetes mellitus, type 2 (HCC)    Hyperlipemia    Hypertension    Left shoulder pain 11/01/2021   Lipoma NEC    anterior upper chest   Osteoarthritis of knee    Shortness of breath 11/01/2021    Patient Active Problem List   Diagnosis Date Noted   (HFpEF) heart failure with preserved ejection fraction (HCC) 01/17/2023   Left shoulder pain 11/01/2021   Shortness of breath 11/01/2021   Candidiasis of skin 03/17/2019   Asthmatic bronchitis 08/06/2018   Mild cardiomegaly 01/03/2018   Well adult exam 12/14/2015   Acute UTI 12/14/2015   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   Bacterial pharyngitis 08/25/2014   Acute bronchitis 07/03/2014   Oral thrush 07/03/2014   Onychomycosis 02/18/2013   URI (upper respiratory infection) 07/11/2012   Cough 07/11/2012   Ganglion cyst 06/25/2011   Foot pain, left 06/25/2011   Otitis media 06/25/2011   LIPOMA 11/03/2010   EUSTACHIAN TUBE DYSFUNCTION, RIGHT 02/18/2010   Asthma with acute exacerbation 11/02/2009   ONYCHOMYCOSIS, TOENAILS 05/18/2009   Hyperlipidemia 09/15/2008   DM2 (diabetes mellitus, type 2)  (HCC) 04/19/2007   Essential hypertension 04/19/2007   Allergic rhinitis 04/19/2007   OSTEOARTHRITIS 04/19/2007    Past Surgical History:  Procedure Laterality Date   ACNE CYST REMOVAL  1970   back   APPENDECTOMY  1963   OOPHORECTOMY  1985   VAGINAL HYSTERECTOMY  1985   complete    OB History   No obstetric history on file.      Home Medications    Prior to Admission medications   Medication Sig Start Date End Date Taking? Authorizing Provider  acetaminophen (TYLENOL) 500 MG tablet Take 1,000 mg by mouth 3 (three) times daily as needed.   Yes [provider]  albuterol (VENTOLIN HFA) 108 (90 Base) MCG/ACT inhaler INHALE 1-2 PUFFS BY MOUTH EVERY 6 HOURS AS NEEDED FOR WHEEZE OR SHORTNESS OF BREATH 10/25/22  Yes Zonia Kief, Amy J, NP  atorvastatin (LIPITOR) 20 MG tablet Take 1 tablet (20 mg total) by mouth daily. 01/17/23  Yes Chilton Si, MD  Blood Glucose Monitoring Suppl (ONETOUCH VERIO REFLECT) w/Device KIT 1 each by Does not apply route daily. E11.9   Yes [provider]  cetirizine (ZYRTEC) 5 MG tablet Take 1 tablet (5 mg total) by mouth daily. 05/31/21  Yes Wallis Bamberg, PA-C  diclofenac Sodium (VOLTAREN) 1 % GEL Apply 2 g topically 4 (four) times daily as needed. 11/01/21  Yes Chilton Si, MD  fluticasone Aleda Grana)  50 MCG/ACT nasal spray Place 2 sprays into both nostrils daily. 06/14/22  Yes McClung, Marzella Schlein, PA-C  ibuprofen (ADVIL) 800 MG tablet Take 1 tablet (800 mg total) by mouth every 8 (eight) hours as needed. 06/14/21  Yes Georganna Skeans, MD  loratadine (CLARITIN) 10 MG tablet TAKE 1 TABLET BY MOUTH EVERY DAY (DRUG NOT COVERED BY INS) 01/06/23  Yes Rema Fendt, NP  Multiple Vitamins-Minerals (HAIR SKIN AND NAILS FORMULA) TABS Take 1 tablet by mouth daily.   Yes [provider]  OneTouch Delica Lancets 30G MISC E11.9 06/15/22  Yes Georganna Skeans, MD  predniSONE (DELTASONE) 20 MG tablet Take 2 tablets (40 mg total) by mouth daily with  breakfast for 3 days. 02/21/23 02/24/23 Yes Zenia Resides, MD  valsartan-hydrochlorothiazide (DIOVAN-HCT) 320-25 MG tablet Take 1 tablet by mouth daily. 01/17/23  Yes Chilton Si, MD  amLODipine (NORVASC) 2.5 MG tablet Take 1 tablet (2.5 mg total) by mouth daily. 09/10/22 01/17/23  Rema Fendt, NP  furosemide (LASIX) 20 MG tablet Take 1 tablet (20 mg total) by mouth 2 (two) times daily for 5 days. 01/13/23 01/18/23  Pollyann Savoy, MD  Glucose Blood (ONETOUCH VERIO VI) 1 each by Other route daily. E11.9    [provider]  glucose blood (ONETOUCH VERIO) test strip Use to check blood sugars once a day 06/15/22   Georganna Skeans, MD  nystatin (MYCOSTATIN/NYSTOP) powder Apply topically 4 (four) times daily. 03/17/19   Pincus Sanes, MD  nystatin cream (MYCOSTATIN) Apply 1 application topically 2 (two) times daily. 03/17/19   Pincus Sanes, MD  pseudoephedrine (SUDAFED) 30 MG tablet Take 1 tablet (30 mg total) by mouth every 8 (eight) hours as needed for congestion. 05/31/21   Wallis Bamberg, PA-C  repaglinide (PRANDIN) 2 MG tablet Take 2 tablets (4 mg total) by mouth 2 (two) times daily before a meal. 09/10/22 01/17/23  Rema Fendt, NP  spironolactone (ALDACTONE) 25 MG tablet Take 1 tablet (25 mg total) by mouth daily. 09/10/22 01/17/23  Rema Fendt, NP    Family History Family History  Problem Relation Age of Onset   Diabetes Mother    Heart attack Mother    Cancer Father        brain   Diabetes Sister    Hypertension Sister    Diabetes Brother    Hypertension Brother    Coronary artery disease Brother    Aneurysm Other        niece    Social History Social History   Tobacco Use   Smoking status: Never    Passive exposure: Never   Smokeless tobacco: Never  Vaping Use   Vaping Use: Never used  Substance Use Topics   Alcohol use: Yes    Alcohol/week: 1.0 standard drink of alcohol    Types: 1 Glasses of wine per week    Comment: ocassionally   Drug use: No      Allergies   Aspirin, Codeine sulfate, Glimepiride, Metformin, and Propoxyphene n-acetaminophen   Review of Systems Review of Systems   Physical Exam Triage Vital Signs ED Triage Vitals [02/21/23 1121]  Enc Vitals Group     BP (!) 185/67     Pulse Rate (!) 58     Resp 18     Temp 98.7 F (37.1 C)     Temp Source Oral     SpO2 97 %     Weight      Height  Head Circumference      Peak Flow      Pain Score      Pain Loc      Pain Edu?      Excl. in GC?    No data found.  Updated Vital Signs BP (!) 185/67 (BP Location: Left Arm)   Pulse (!) 58   Temp 98.7 F (37.1 C) (Oral)   Resp 18   SpO2 97%   Visual Acuity Right Eye Distance:   Left Eye Distance:   Bilateral Distance:    Right Eye Near:   Left Eye Near:    Bilateral Near:     Physical Exam Vitals reviewed.  Constitutional:      General: She is not in acute distress.    Appearance: She is not ill-appearing, toxic-appearing or diaphoretic.  HENT:     Right Ear: Tympanic membrane and ear canal normal.     Left Ear: Tympanic membrane and ear canal normal.     Nose: Nose normal.     Mouth/Throat:     Mouth: Mucous membranes are moist.  Eyes:     Extraocular Movements: Extraocular movements intact.     Conjunctiva/sclera: Conjunctivae normal.     Pupils: Pupils are equal, round, and reactive to light.  Cardiovascular:     Rate and Rhythm: Normal rate and regular rhythm.     Heart sounds: No murmur heard. Pulmonary:     Effort: No respiratory distress.     Breath sounds: No stridor. No wheezing, rhonchi or rales.  Musculoskeletal:     Cervical back: Neck supple.  Lymphadenopathy:     Cervical: No cervical adenopathy.  Skin:    Coloration: Skin is not pale.  Neurological:     General: No focal deficit present.     Mental Status: She is alert and oriented to person, place, and time.  Psychiatric:        Behavior: Behavior normal.      UC Treatments / Results  Labs (all labs  ordered are listed, but only abnormal results are displayed) Labs Reviewed - No data to display  EKG   Radiology No results found.  Procedures Procedures (including critical care time)  Medications Ordered in UC Medications - No data to display  Initial Impression / Assessment and Plan / UC Course  I have reviewed the triage vital signs and the nursing notes.  Pertinent labs & imaging results that were available during my care of the patient were reviewed by me and considered in my medical decision making (see chart for details).        Her exam is normal at this time, but with her having had some wheezing this morning it is unusual for her, I will treat for asthma exacerbation with 3 days of prednisone.  She already has an inhaler.   Final Clinical Impressions(s) / UC Diagnoses   Final diagnoses:  Mild intermittent asthma with acute exacerbation     Discharge Instructions      Take prednisone 20 mg--2 daily for 5 days  Continue using your albuterol inhaler every 4 hours as needed for shortness of breath or wheezing     ED Prescriptions     Medication Sig Dispense Auth. Provider   predniSONE (DELTASONE) 20 MG tablet Take 2 tablets (40 mg total) by mouth daily with breakfast for 3 days. 6 tablet Marlinda Mike Janace Aris, MD      PDMP not reviewed this encounter.   Zenia Resides, MD 02/21/23  1134  

## 2023-02-21 NOTE — ED Triage Notes (Signed)
Pt reports standing outside today when some pollen got into her nose. Pt stated she started coughing up mucous. Pt reports using her inhaler 45 minutes ago.

## 2023-02-21 NOTE — Discharge Instructions (Signed)
Take prednisone 20 mg--2 daily for 5 days  Continue using your albuterol inhaler every 4 hours as needed for shortness of breath or wheezing

## 2023-02-24 ENCOUNTER — Encounter (HOSPITAL_COMMUNITY): Payer: Self-pay

## 2023-02-24 ENCOUNTER — Other Ambulatory Visit: Payer: Self-pay

## 2023-02-24 ENCOUNTER — Emergency Department (HOSPITAL_COMMUNITY): Payer: PPO

## 2023-02-24 ENCOUNTER — Observation Stay (HOSPITAL_COMMUNITY)
Admission: EM | Admit: 2023-02-24 | Discharge: 2023-02-27 | Disposition: A | Discharge status: Home / Self Care | Payer: PPO | Attending: Internal Medicine | Admitting: Internal Medicine

## 2023-02-24 DIAGNOSIS — I480 Paroxysmal atrial fibrillation: Secondary | ICD-10-CM

## 2023-02-24 DIAGNOSIS — M545 Low back pain, unspecified: Secondary | ICD-10-CM | POA: Diagnosis not present

## 2023-02-24 DIAGNOSIS — J45909 Unspecified asthma, uncomplicated: Secondary | ICD-10-CM | POA: Insufficient documentation

## 2023-02-24 DIAGNOSIS — I4891 Unspecified atrial fibrillation: Secondary | ICD-10-CM | POA: Diagnosis not present

## 2023-02-24 DIAGNOSIS — R1013 Epigastric pain: Secondary | ICD-10-CM | POA: Diagnosis not present

## 2023-02-24 DIAGNOSIS — Z79899 Other long term (current) drug therapy: Secondary | ICD-10-CM | POA: Diagnosis not present

## 2023-02-24 DIAGNOSIS — R079 Chest pain, unspecified: Secondary | ICD-10-CM

## 2023-02-24 DIAGNOSIS — R000 Tachycardia, unspecified: Secondary | ICD-10-CM | POA: Diagnosis not present

## 2023-02-24 DIAGNOSIS — E119 Type 2 diabetes mellitus without complications: Secondary | ICD-10-CM | POA: Insufficient documentation

## 2023-02-24 DIAGNOSIS — J209 Acute bronchitis, unspecified: Secondary | ICD-10-CM | POA: Diagnosis present

## 2023-02-24 DIAGNOSIS — R55 Syncope and collapse: Secondary | ICD-10-CM | POA: Insufficient documentation

## 2023-02-24 DIAGNOSIS — M542 Cervicalgia: Secondary | ICD-10-CM | POA: Diagnosis not present

## 2023-02-24 DIAGNOSIS — I5032 Chronic diastolic (congestive) heart failure: Secondary | ICD-10-CM | POA: Diagnosis not present

## 2023-02-24 DIAGNOSIS — R0789 Other chest pain: Secondary | ICD-10-CM | POA: Diagnosis not present

## 2023-02-24 DIAGNOSIS — R7989 Other specified abnormal findings of blood chemistry: Secondary | ICD-10-CM | POA: Diagnosis not present

## 2023-02-24 DIAGNOSIS — I499 Cardiac arrhythmia, unspecified: Secondary | ICD-10-CM | POA: Diagnosis not present

## 2023-02-24 DIAGNOSIS — R519 Headache, unspecified: Secondary | ICD-10-CM | POA: Diagnosis not present

## 2023-02-24 DIAGNOSIS — I110 Hypertensive heart disease with heart failure: Secondary | ICD-10-CM | POA: Diagnosis not present

## 2023-02-24 DIAGNOSIS — M549 Dorsalgia, unspecified: Secondary | ICD-10-CM | POA: Diagnosis not present

## 2023-02-24 HISTORY — DX: Paroxysmal atrial fibrillation: I48.0

## 2023-02-24 LAB — BASIC METABOLIC PANEL
Anion gap: 9 (ref 5–15)
BUN: 32 mg/dL — ABNORMAL HIGH (ref 8–23)
CO2: 20 mmol/L — ABNORMAL LOW (ref 22–32)
Calcium: 9 mg/dL (ref 8.9–10.3)
Chloride: 106 mmol/L (ref 98–111)
Creatinine, Ser: 1.2 mg/dL — ABNORMAL HIGH (ref 0.44–1.00)
GFR, Estimated: 46 mL/min — ABNORMAL LOW (ref >60)
Glucose, Bld: 221 mg/dL — ABNORMAL HIGH (ref 70–99)
Potassium: 4.8 mmol/L (ref 3.5–5.1)
Sodium: 135 mmol/L (ref 135–145)

## 2023-02-24 LAB — CBC
HCT: 39 % (ref 36.0–46.0)
Hemoglobin: 12 g/dL (ref 12.0–15.0)
MCH: 27.2 pg (ref 26.0–34.0)
MCHC: 30.8 g/dL (ref 30.0–36.0)
MCV: 88.4 fL (ref 80.0–100.0)
Platelets: 324 10*3/uL (ref 150–400)
RBC: 4.41 MIL/uL (ref 3.87–5.11)
RDW: 14.9 % (ref 11.5–15.5)
WBC: 8.4 10*3/uL (ref 4.0–10.5)
nRBC: 0 % (ref 0.0–0.2)

## 2023-02-24 LAB — HEPARIN LEVEL (UNFRACTIONATED): Heparin Unfractionated: 0.66 IU/mL (ref 0.30–0.70)

## 2023-02-24 LAB — GLUCOSE, CAPILLARY
Glucose-Capillary: 164 mg/dL — ABNORMAL HIGH (ref 70–99)
Glucose-Capillary: 180 mg/dL — ABNORMAL HIGH (ref 70–99)

## 2023-02-24 LAB — TSH: TSH: 1.996 u[IU]/mL (ref 0.350–4.500)

## 2023-02-24 LAB — CBG MONITORING, ED: Glucose-Capillary: 214 mg/dL — ABNORMAL HIGH (ref 70–99)

## 2023-02-24 LAB — D-DIMER, QUANTITATIVE: D-Dimer, Quant: 0.82 ug/mL-FEU — ABNORMAL HIGH (ref 0.00–0.50)

## 2023-02-24 LAB — TROPONIN I (HIGH SENSITIVITY)
Troponin I (High Sensitivity): 127 ng/L (ref <18)
Troponin I (High Sensitivity): 159 ng/L (ref <18)
Troponin I (High Sensitivity): 217 ng/L (ref <18)

## 2023-02-24 LAB — MAGNESIUM: Magnesium: 1.9 mg/dL (ref 1.7–2.4)

## 2023-02-24 MED ORDER — ASPIRIN 81 MG PO CHEW
324.0000 mg | CHEWABLE_TABLET | ORAL | Status: DC
Start: 2023-02-24 — End: 2023-02-24

## 2023-02-24 MED ORDER — REPAGLINIDE 1 MG PO TABS
4.0000 mg | ORAL_TABLET | Freq: Two times a day (BID) | ORAL | Status: DC
  Filled 2023-02-24 (×2): qty 4
  Filled 2023-02-24: qty 2

## 2023-02-24 MED ORDER — SPIRONOLACTONE 25 MG PO TABS
25.0000 mg | ORAL_TABLET | Freq: Every day | ORAL | Status: DC
  Administered 2023-02-25 – 2023-02-27 (×3): 25 mg via ORAL
  Filled 2023-02-24: qty 2
  Filled 2023-02-24 (×2): qty 1

## 2023-02-24 MED ORDER — BUDESONIDE 0.25 MG/2ML IN SUSP
0.2500 mg | Freq: Two times a day (BID) | RESPIRATORY_TRACT | Status: DC
  Administered 2023-02-24 – 2023-02-27 (×6): 0.25 mg via RESPIRATORY_TRACT
  Filled 2023-02-24 (×6): qty 2

## 2023-02-24 MED ORDER — METOPROLOL TARTRATE 25 MG PO TABS
25.0000 mg | ORAL_TABLET | Freq: Two times a day (BID) | ORAL | Status: DC
  Administered 2023-02-24 – 2023-02-26 (×4): 25 mg via ORAL
  Filled 2023-02-24 (×3): qty 1

## 2023-02-24 MED ORDER — DILTIAZEM HCL-DEXTROSE 125-5 MG/125ML-% IV SOLN (PREMIX)
5.0000 mg/h | INTRAVENOUS | Status: DC
  Administered 2023-02-24 – 2023-02-26 (×2): 5 mg/h via INTRAVENOUS
  Filled 2023-02-24 (×3): qty 125

## 2023-02-24 MED ORDER — LORATADINE 10 MG PO TABS
10.0000 mg | ORAL_TABLET | Freq: Every day | ORAL | Status: DC
  Administered 2023-02-25 – 2023-02-27 (×3): 10 mg via ORAL
  Filled 2023-02-24 (×3): qty 1

## 2023-02-24 MED ORDER — IPRATROPIUM BROMIDE HFA 17 MCG/ACT IN AERS: 1.0000 | INHALATION_SPRAY | Freq: Three times a day (TID) | RESPIRATORY_TRACT | 2 refills | Status: DC

## 2023-02-24 MED ORDER — HYDROCHLOROTHIAZIDE 25 MG PO TABS
25.0000 mg | ORAL_TABLET | Freq: Every day | ORAL | Status: DC
  Administered 2023-02-25 – 2023-02-26 (×2): 25 mg via ORAL
  Filled 2023-02-24 (×2): qty 1

## 2023-02-24 MED ORDER — BUDESONIDE 0.25 MG/2ML IN SUSP: 0.2500 mg | Freq: Two times a day (BID) | RESPIRATORY_TRACT | 2 refills | Status: DC

## 2023-02-24 MED ORDER — INSULIN ASPART 100 UNIT/ML IJ SOLN
0.0000 [IU] | Freq: Three times a day (TID) | INTRAMUSCULAR | Status: DC
  Administered 2023-02-24 – 2023-02-25 (×2): 3 [IU] via SUBCUTANEOUS
  Administered 2023-02-25 – 2023-02-26 (×3): 5 [IU] via SUBCUTANEOUS
  Administered 2023-02-26: 8 [IU] via SUBCUTANEOUS
  Administered 2023-02-26: 2 [IU] via SUBCUTANEOUS
  Administered 2023-02-27: 5 [IU] via SUBCUTANEOUS
  Administered 2023-02-27: 3 [IU] via SUBCUTANEOUS

## 2023-02-24 MED ORDER — IPRATROPIUM BROMIDE 0.02 % IN SOLN: 0.5000 mg | RESPIRATORY_TRACT | Status: DC | PRN

## 2023-02-24 MED ORDER — HEPARIN BOLUS VIA INFUSION
4000.0000 [IU] | Freq: Once | INTRAVENOUS | Status: AC
  Administered 2023-02-24: 4000 [IU] via INTRAVENOUS
  Filled 2023-02-24: qty 4000

## 2023-02-24 MED ORDER — ASPIRIN 81 MG PO TBEC
81.0000 mg | DELAYED_RELEASE_TABLET | Freq: Every day | ORAL | Status: DC
  Administered 2023-02-25 – 2023-02-26 (×2): 81 mg via ORAL
  Filled 2023-02-24 (×2): qty 1

## 2023-02-24 MED ORDER — IRBESARTAN 150 MG PO TABS
300.0000 mg | ORAL_TABLET | Freq: Every day | ORAL | Status: DC
  Administered 2023-02-25 – 2023-02-26 (×2): 300 mg via ORAL
  Filled 2023-02-24 (×2): qty 2

## 2023-02-24 MED ORDER — ATORVASTATIN CALCIUM 10 MG PO TABS
20.0000 mg | ORAL_TABLET | Freq: Every day | ORAL | Status: DC
  Administered 2023-02-25 – 2023-02-27 (×3): 20 mg via ORAL
  Filled 2023-02-24 (×3): qty 2

## 2023-02-24 MED ORDER — HEPARIN (PORCINE) 25000 UT/250ML-% IV SOLN
1100.0000 [IU]/h | INTRAVENOUS | Status: DC
  Administered 2023-02-24: 1100 [IU]/h via INTRAVENOUS
  Filled 2023-02-24 (×2): qty 250

## 2023-02-24 MED ORDER — ACETAMINOPHEN 500 MG PO TABS: 1000.0000 mg | ORAL_TABLET | Freq: Three times a day (TID) | ORAL | Status: DC | PRN

## 2023-02-24 MED ORDER — VALSARTAN-HYDROCHLOROTHIAZIDE 320-25 MG PO TABS: 1.0000 | ORAL_TABLET | Freq: Every day | ORAL | Status: DC

## 2023-02-24 MED ORDER — ONDANSETRON HCL 4 MG/2ML IJ SOLN: 4.0000 mg | Freq: Four times a day (QID) | INTRAMUSCULAR | Status: DC | PRN

## 2023-02-24 MED ORDER — ASPIRIN 300 MG RE SUPP: 300.0000 mg | RECTAL | Status: DC

## 2023-02-24 MED ORDER — DILTIAZEM LOAD VIA INFUSION
10.0000 mg | Freq: Once | INTRAVENOUS | Status: AC
  Administered 2023-02-24: 10 mg via INTRAVENOUS
  Filled 2023-02-24: qty 10

## 2023-02-24 MED ORDER — NITROGLYCERIN 0.4 MG SL SUBL: 0.4000 mg | SUBLINGUAL_TABLET | SUBLINGUAL | Status: DC | PRN

## 2023-02-24 NOTE — Progress Notes (Signed)
ANTICOAGULATION CONSULT NOTE - Initial Consult  Pharmacy Consult for Heparin Indication: atrial fibrillation  Allergies  Allergen Reactions   Aspirin Nausea And Vomiting    Uncoated aspirin Jittery GI upset   Codeine Sulfate Nausea And Vomiting    Heart races.   Glimepiride Diarrhea   Metformin Diarrhea   Propoxyphene N-Acetaminophen Nausea And Vomiting    Patient Measurements: Height: 5\' 3"  (160 cm) Weight: 90.7 kg (200 lb) IBW/kg (Calculated) : 52.4 Heparin Dosing Weight: 73.1 kg  Vital Signs: Temp: 97.7 F (36.5 C) (06/02 1244) Temp Source: Oral (06/02 1244) BP: 152/76 (06/02 1245) Pulse Rate: 103 (06/02 1245)  Labs: Recent Labs    02/24/23 0909 02/24/23 1236  HGB 12.0  --   HCT 39.0  --   PLT 324  --   CREATININE 1.20*  --   TROPONINIHS 127* 159*    Estimated Creatinine Clearance: 40.6 mL/min (A) (by C-G formula based on SCr of 1.2 mg/dL (H)).   Medical History: Past Medical History:  Diagnosis Date   (HFpEF) heart failure with preserved ejection fraction (HCC) 01/17/2023   Allergic rhinitis    Diabetes mellitus, type 2 (HCC)    Hyperlipemia    Hypertension    Left shoulder pain 11/01/2021   Lipoma NEC    anterior upper chest   Osteoarthritis of knee    Shortness of breath 11/01/2021    Medications:  (Not in a hospital admission)  Scheduled:   [START ON 02/25/2023] aspirin EC  81 mg Oral Daily   atorvastatin  20 mg Oral Daily   budesonide (PULMICORT) nebulizer solution  0.25 mg Nebulization BID   heparin  4,000 Units Intravenous Once   insulin aspart  0-15 Units Subcutaneous TID WC   loratadine  10 mg Oral Daily   metoprolol tartrate  25 mg Oral BID   repaglinide  4 mg Oral BID AC   spironolactone  25 mg Oral Daily   valsartan-hydrochlorothiazide  1 tablet Oral Daily   Infusions:   diltiazem (CARDIZEM) infusion 5 mg/hr (02/24/23 1000)   heparin     PRN: acetaminophen, ipratropium, nitroGLYCERIN, ondansetron (ZOFRAN)  IV  Assessment: 56 yof with a history of HFpEF, HTN, HLD. Patient is presenting with new onset AF. Heparin per pharmacy consult placed for atrial fibrillation.  Patient is not on anticoagulation prior to arrival.  Hgb 12; plt 324  Goal of Therapy:  Heparin level 0.3-0.7 units/ml Monitor platelets by anticoagulation protocol: Yes   Plan:  Give IV heparin 4000 units bolus x 1 Start heparin infusion at 1100 units/hr Check anti-Xa level in 8 hours and daily while on heparin Continue to monitor H&H and platelets F/u plan for anticoagulation post-heparin  Delmar Landau, PharmD, BCPS 02/24/2023 1:55 PM ED Clinical Pharmacist -  [phone]

## 2023-02-24 NOTE — Plan of Care (Signed)

## 2023-02-24 NOTE — ED Triage Notes (Signed)
PT BIB EMS for a syncopal episode after having chest pain, tightness, and SOB. Patient reports having heartburn like symptoms, SOB/asthma symptoms for the last 2 days.    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  Per EMS EKG Afib RVR 150's Aspirin 324 mg Cardizem 10 160/100 CBG 280 100% RA

## 2023-02-24 NOTE — ED Notes (Signed)
Assist pt to bedside toilet pt did good

## 2023-02-24 NOTE — ED Provider Notes (Signed)
[place] EMERGENCY DEPARTMENT AT [facility] Provider Note   CSN: [id number] Arrival date & time: 02/24/23  0849     History  Chief Complaint  Patient presents with   Chest Pain    Syncopal episode    Rachel [name] is a 82 y.o. female.   Chest Pain    Patient has a history of diabetes hypertension osteoarthritis hyperlipidemia heart failure with preserved ejection fraction.  She was at work today.  She was experiencing burning discomfort in her chest.  It was located in the center of her chest.  Patient states she continues to try to work.  She was going to the bathroom next thing she remembers is having coworkers checking on her.  Apparently patient lost consciousness for 2 minutes.  Patient states she is having some trouble with pain in her back and nausea but that has now resolved.  She denies any headache or neck pain.  No complaints of any injuries to her arms or legs.  Patient denies any history of fainting spells in the past.  No history of coronary artery disease or irregular heart rhythms that she is aware of  Home Medications Prior to Admission medications   Medication Sig Start Date End Date Taking? Authorizing Provider  budesonide (PULMICORT) 0.25 MG/2ML nebulizer solution Take 2 mLs (0.25 mg total) by nebulization 2 (two) times daily. 02/24/23 02/24/24 Yes Emeline General, MD  ipratropium (ATROVENT HFA) 17 MCG/ACT inhaler Inhale 1 puff into the lungs 3 (three) times daily. 02/24/23 02/24/24 Yes Emeline General, MD  acetaminophen (TYLENOL) 500 MG tablet Take 1,000 mg by mouth 3 (three) times daily as needed.    [provider]  albuterol (VENTOLIN HFA) 108 (90 Base) MCG/ACT inhaler INHALE 1-2 PUFFS BY MOUTH EVERY 6 HOURS AS NEEDED FOR WHEEZE OR SHORTNESS OF BREATH 10/25/22   Rema Fendt, NP  amLODipine (NORVASC) 2.5 MG tablet Take 1 tablet (2.5 mg total) by mouth daily. 09/10/22 01/17/23  Rema Fendt, NP  atorvastatin (LIPITOR) 20 MG tablet Take 1  tablet (20 mg total) by mouth daily. 01/17/23   Chilton Si, MD  Blood Glucose Monitoring Suppl [facility] VERIO REFLECT) w/Device KIT 1 each by Does not apply route daily. E11.9    [provider]  cetirizine (ZYRTEC) 5 MG tablet Take 1 tablet (5 mg total) by mouth daily. 05/31/21   Wallis Bamberg, PA-C  diclofenac Sodium (VOLTAREN) 1 % GEL Apply 2 g topically 4 (four) times daily as needed. 11/01/21   Chilton Si, MD  furosemide (LASIX) 20 MG tablet Take 1 tablet (20 mg total) by mouth 2 (two) times daily for 5 days. 01/13/23 01/18/23  Pollyann Savoy, MD  Glucose Blood (ONETOUCH VERIO VI) 1 each by Other route daily. E11.9    [provider]  glucose blood (ONETOUCH VERIO) test strip Use to check blood sugars once a day 06/15/22   Georganna Skeans, MD  ibuprofen (ADVIL) 800 MG tablet Take 1 tablet (800 mg total) by mouth every 8 (eight) hours as needed. 06/14/21   Georganna Skeans, MD  loratadine (CLARITIN) 10 MG tablet Take 1 tablet (10 mg total) by mouth daily as needed for allergies. 02/21/23   Zenia Resides, MD  Multiple Vitamins-Minerals (HAIR SKIN AND NAILS FORMULA) TABS Take 1 tablet by mouth daily.    [provider]  nystatin (MYCOSTATIN/NYSTOP) powder Apply topically 4 (four) times daily. 03/17/19   Pincus Sanes, MD  nystatin cream (MYCOSTATIN) Apply 1  application topically 2 (two) times daily. 03/17/19   Pincus Sanes, MD  OneTouch Delica Lancets 30G MISC E11.9 06/15/22   Georganna Skeans, MD  predniSONE (DELTASONE) 20 MG tablet Take 2 tablets (40 mg total) by mouth daily with breakfast for 3 days. 02/21/23 02/24/23  Zenia Resides, MD  pseudoephedrine (SUDAFED) 30 MG tablet Take 1 tablet (30 mg total) by mouth every 8 (eight) hours as needed for congestion. 05/31/21   Wallis Bamberg, PA-C  repaglinide (PRANDIN) 2 MG tablet Take 2 tablets (4 mg total) by mouth 2 (two) times daily before a meal. 09/10/22 01/17/23  Rema Fendt, NP  spironolactone (ALDACTONE) 25  MG tablet Take 1 tablet (25 mg total) by mouth daily. 09/10/22 01/17/23  Rema Fendt, NP  valsartan-hydrochlorothiazide (DIOVAN-HCT) 320-25 MG tablet Take 1 tablet by mouth daily. 01/17/23   Chilton Si, MD      Allergies    Aspirin, Codeine sulfate, Glimepiride, Metformin, and Propoxyphene n-acetaminophen    Review of Systems   Review of Systems  Cardiovascular:  Positive for chest pain.    Physical Exam Updated Vital Signs BP (!) 152/76   Pulse (!) 103   Temp 97.7 F (36.5 C) (Oral)   Resp 13   Ht 1.6 m (5\' 3" )   Wt 90.7 kg   SpO2 98%   BMI 35.43 kg/m  Physical Exam Vitals and nursing note reviewed.  Constitutional:      General: She is not in acute distress.    Appearance: She is well-developed. She is not diaphoretic.  HENT:     Head: Normocephalic and atraumatic.     Right Ear: External ear normal.     Left Ear: External ear normal.  Eyes:     General: No scleral icterus.       Right eye: No discharge.        Left eye: No discharge.     Conjunctiva/sclera: Conjunctivae normal.  Neck:     Trachea: No tracheal deviation.  Cardiovascular:     Rate and Rhythm: Tachycardia present. Rhythm irregular.  Pulmonary:     Effort: Pulmonary effort is normal. No respiratory distress.     Breath sounds: Normal breath sounds. No stridor. No wheezing or rales.  Abdominal:     General: Bowel sounds are normal. There is no distension.     Palpations: Abdomen is soft.     Tenderness: There is no abdominal tenderness. There is no guarding or rebound.  Musculoskeletal:        General: No tenderness or deformity.     Cervical back: Neck supple.     Right lower leg: Edema present.     Left lower leg: Edema present.     Comments: Tenderness palpation cervical thoracic or lumbar spine, no tenderness palpation in her extremities  Skin:    General: Skin is warm and dry.     Findings: No rash.  Neurological:     General: No focal deficit present.     Mental Status: She is  alert.     Cranial Nerves: No cranial nerve deficit, dysarthria or facial asymmetry.     Sensory: No sensory deficit.     Motor: No abnormal muscle tone or seizure activity.     Coordination: Coordination normal.  Psychiatric:        Mood and Affect: Mood normal.     ED Results / Procedures / Treatments   Labs (all labs ordered are listed, but only abnormal results are displayed)  Labs Reviewed  BASIC METABOLIC PANEL - Abnormal; Notable for the following components:      Result Value   CO2 20 (*)    Glucose, Bld 221 (*)    BUN 32 (*)    Creatinine, Ser 1.20 (*)    GFR, Estimated 46 (*)    All other components within normal limits  CBG MONITORING, ED - Abnormal; Notable for the following components:   Glucose-Capillary 214 (*)    All other components within normal limits  TROPONIN I (HIGH SENSITIVITY) - Abnormal; Notable for the following components:   Troponin I (High Sensitivity) 127 (*)    All other components within normal limits  TROPONIN I (HIGH SENSITIVITY) - Abnormal; Notable for the following components:   Troponin I (High Sensitivity) 159 (*)    All other components within normal limits  CBC  MAGNESIUM  D-DIMER, QUANTITATIVE (NOT AT [facility])    EKG EKG Interpretation  Date/Time:  Sunday February 24 2023 09:00:35 EDT Ventricular Rate:  102 PR Interval:    QRS Duration: 89 QT Interval:  325 QTC Calculation: 424 R Axis:   54 Text Interpretation: Atrial fibrillation a fib is new since last tracing Confirmed by Linwood Dibbles [phone]) on 02/24/2023 9:12:33 AM  Radiology CT Cervical Spine Wo Contrast  Result Date: 02/24/2023 CLINICAL DATA:  82 year old female with syncope, chest tightness, shortness of breath. Pain and nausea. EXAM: CT CERVICAL SPINE WITHOUT CONTRAST TECHNIQUE: Multidetector CT imaging of the cervical spine was performed without intravenous contrast. Multiplanar CT image reconstructions were also generated. RADIATION DOSE REDUCTION: This exam was performed  according to the departmental dose-optimization program which includes automated exposure control, adjustment of the mA and/or kV according to patient size and/or use of iterative reconstruction technique. COMPARISON:  Head CT today. FINDINGS: Alignment: Normal cervical lordosis. Cervicothoracic junction alignment is within normal limits. Bilateral posterior element alignment is within normal limits. Skull base and vertebrae: Bone mineralization is within normal limits for age. Visualized skull base is intact. No atlanto-occipital dissociation. C1 and C2 appear intact and aligned. No acute osseous abnormality identified. Soft tissues and spinal canal: No prevertebral fluid or swelling. No visible canal hematoma. Bulky calcified proximal right ICA atherosclerosis, otherwise negative visible noncontrast neck soft tissues. Disc levels: Upper cervical facet arthropathy but otherwise mild for age cervical spine degeneration. Upper chest: Calcified aortic atherosclerosis. Mild respiratory motion. Visible upper thoracic levels appear grossly intact. IMPRESSION: 1. No acute traumatic injury identified in the cervical spine. 2. Mild for age cervical spine degeneration aside from upper cervical facet arthropathy. 3. Bulky calcified right carotid atherosclerosis. Electronically Signed   By: Odessa Fleming M.D.   On: 02/24/2023 10:44   CT Head Wo Contrast  Result Date: 02/24/2023 CLINICAL DATA:  82 year old female with syncope, chest tightness, shortness of breath. Pain and nausea. EXAM: CT HEAD WITHOUT CONTRAST TECHNIQUE: Contiguous axial images were obtained from the base of the skull through the vertex without intravenous contrast. RADIATION DOSE REDUCTION: This exam was performed according to the departmental dose-optimization program which includes automated exposure control, adjustment of the mA and/or kV according to patient size and/or use of iterative reconstruction technique. COMPARISON:  None Available. FINDINGS: Brain:  Occasional small areas of dural calcification (normal variant more pronounced in the right hemisphere. Cerebral volume is within normal limits for age. No midline shift, ventriculomegaly, mass effect, evidence of mass lesion, intracranial hemorrhage or evidence of cortically based acute infarction. Gray-white matter differentiation mostly normal for age. There is minimal to mild left  frontal lobe white matter hypodensity. No cortical encephalomalacia. Vascular: Calcified atherosclerosis at the skull base. No suspicious intracranial vascular hyperdensity. Skull: No acute osseous abnormality identified. Sinuses/Orbits: Visualized paranasal sinuses and mastoids are clear. Other: No orbit or scalp soft tissue injury identified. IMPRESSION: 1. No acute intracranial abnormality or acute traumatic injury identified. 2. Largely normal for age; minimal cerebral white matter changes, calcified atherosclerosis. Electronically Signed   By: Odessa Fleming M.D.   On: 02/24/2023 10:42   DG Chest Portable 1 View  Result Date: 02/24/2023 CLINICAL DATA:  Chest pain.  Syncopal episode. EXAM: PORTABLE CHEST 1 VIEW COMPARISON:  01/13/2023. FINDINGS: Cardiac silhouette is mildly enlarged. No mediastinal or hilar masses. Clear lungs.  No pleural effusion or pneumothorax. Skeletal structures are grossly intact. IMPRESSION: No active disease. Electronically Signed   By: Amie Portland M.D.   On: 02/24/2023 09:55    Procedures Procedures    Medications Ordered in ED Medications  diltiazem (CARDIZEM) 1 mg/mL load via infusion 10 mg (10 mg Intravenous Bolus from Bag 02/24/23 1002)    And  diltiazem (CARDIZEM) 125 mg in dextrose 5% 125 mL (1 mg/mL) infusion (5 mg/hr Intravenous New Bag/Given 02/24/23 1000)  acetaminophen (TYLENOL) tablet 1,000 mg (has no administration in time range)  atorvastatin (LIPITOR) tablet 20 mg (has no administration in time range)  spironolactone (ALDACTONE) tablet 25 mg (has no administration in time range)   valsartan-hydrochlorothiazide (DIOVAN-HCT) 320-25 MG per tablet 1 tablet (has no administration in time range)  metoprolol tartrate (LOPRESSOR) tablet 25 mg (has no administration in time range)  repaglinide (PRANDIN) tablet 4 mg (has no administration in time range)  loratadine (CLARITIN) tablet 10 mg (has no administration in time range)  aspirin EC tablet 81 mg (has no administration in time range)  nitroGLYCERIN (NITROSTAT) SL tablet 0.4 mg (has no administration in time range)  ondansetron (ZOFRAN) injection 4 mg (has no administration in time range)  insulin aspart (novoLOG) injection 0-15 Units (has no administration in time range)  ipratropium (ATROVENT) nebulizer solution 0.5 mg (has no administration in time range)  budesonide (PULMICORT) nebulizer solution 0.25 mg (has no administration in time range)    ED Course/ Medical Decision Making/ A&P Clinical Course as of 02/24/23 1347  Sun Feb 24, 2023  1209 CBC CBC normal [JK]  1209 No signs of serious head or neck injury [JK]  1209 X-ray without acute findings [JK]  1227 Magnesium normal.  Troponin elevated at 127 [JK]  1247 D/w Cardiology, Dr Bjorn Pippin,  will see pt in consultation.  [JK]  1347 Case discussed with Dr. Chipper Herb regarding admission [JK]    Clinical Course User Index [JK] Linwood Dibbles, MD                             Medical Decision Making Differential diagnosis includes but not limited to acute coronary syndrome, cardiac dysrhythmia, pneumonia, pneumothorax, PE  Problems Addressed: Atrial fibrillation, unspecified type [facility]): acute illness or injury that poses a threat to life or bodily functions Chest pain, unspecified type: acute illness or injury that poses a threat to life or bodily functions Syncope, unspecified syncope type: acute illness or injury that poses a threat to life or bodily functions  Amount and/or Complexity of Data Reviewed Labs: ordered. Decision-making details documented in ED  Course. Radiology: ordered.  Risk Prescription drug management. Decision regarding hospitalization.   Patient presented to the ED for evaluation of chest pain and followed by  a syncopal episode.  Patient on arrival in the ED noted to be in atrial fibrillation.  Patient had been given Cardizem by EMS.  I have started on a bolus and infusion with improvement of her heart rate.  Patient also however had chest pain and a syncopal episode preceding this.  Troponins are elevated.  Concerning for the possibility of acute coronary syndrome.  Ischial EKG does not show evidence of ST elevation myocardial infarction.  Patient remains pain-free now.  I will consult with cardiology and the medical service.        Final Clinical Impression(s) / ED Diagnoses Final diagnoses:  Atrial fibrillation, unspecified type (HCC)  Chest pain, unspecified type  Syncope, unspecified syncope type    Rx / DC Orders ED Discharge Orders          Ordered    ipratropium (ATROVENT HFA) 17 MCG/ACT inhaler  3 times daily        02/24/23 1332    budesonide (PULMICORT) 0.25 MG/2ML nebulizer solution  2 times daily        02/24/23 1341              Linwood Dibbles, MD 02/24/23 1348

## 2023-02-24 NOTE — H&P (Signed)
History and Physical    Rachel [name] UJW:[id number] DOB: 05-22-1941 DOA: 02/24/2023  PCP: Rema Fendt, NP (Confirm with patient/family/NH records and if not entered, this has to be entered at [facility] point of entry) Patient coming from: Home  I have personally briefly reviewed patient's old medical records in [facility]  Chief Complaint: I passed out  HPI: Rachel [name] is a 82 y.o. female with medical history significant of moderate asthma, HTN, chronic HFpEF, HLD, IIDM, came with palpitation, chest pain and syncope.  Patient has had poorly controlled asthma, with only as needed albuterol available and she also has severe seasonal allergy.  Last Wednesday she started to have runny nose and dry cough and wheezing and she went to see urgent care on Friday and was prescribed with prednisone.  Recently, her blood pressure has been poorly controlled she went to see her cardiologist about 1 month ago who increased her blood pressure medication, in addition to bradycardia prescribed amlodipine and spironolactone, cardiology added combined pill of Diovan/HCTZ.  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  She fell forward and hit her right-sided rib cage but no head injury.  She estimated only LOC for a few seconds, no tongue biting or loss control of urine or bowel movement.  No postictal confusion.  She described the brief episode of chest pain this burning sensation nonradiating 3-4/10 and dissipated after strict return her consciousness.  No history of chest pain or CAD before no history of A-fib before.  She takes Prandin for glucose control most recent A1c 8.4, denies any history of hypoglycemia.  She reported occasional right nostril bleeding, all occasions stops by itself, never came to ED for uncontrolled nosebleed.  Denies any GI bleed or  intracranial bleed.  ED Course: Patient was found to be in rapid A-fib, blood pressure elevated 140/100.  Chest x-ray negative for acute findings.  CT head and neck negative for acute intracranial abnormalities or dislocation or fracture.  Blood work showed K4.5, creatinine 1.2, glucose 220.  Review of Systems: As per HPI otherwise 14 point review of systems negative.    Past Medical History:  Diagnosis Date   (HFpEF) heart failure with preserved ejection fraction (HCC) 01/17/2023   Allergic rhinitis    Diabetes mellitus, type 2 (HCC)    Hyperlipemia    Hypertension    Left shoulder pain 11/01/2021   Lipoma NEC    anterior upper chest   Osteoarthritis of knee    Shortness of breath 11/01/2021    Past Surgical History:  Procedure Laterality Date   ACNE CYST REMOVAL  1970   back   APPENDECTOMY  1963   OOPHORECTOMY  1985   VAGINAL HYSTERECTOMY  1985   complete     reports that she has never smoked. She has never been exposed to tobacco smoke. She has never used smokeless tobacco. She reports current alcohol use of about 1.0 standard drink of alcohol per week. She reports that she does not use drugs.  Allergies  Allergen Reactions   Aspirin Nausea And Vomiting    Uncoated aspirin Jittery GI upset   Codeine Sulfate Nausea And Vomiting    Heart races.   Glimepiride Diarrhea   Metformin Diarrhea   Propoxyphene N-Acetaminophen Nausea And Vomiting    Family History  Problem Relation Age of Onset   Diabetes Mother  Heart attack Mother    Cancer Father        brain   Diabetes Sister    Hypertension Sister    Diabetes Brother    Hypertension Brother    Coronary artery disease Brother    Aneurysm Other        niece     Prior to Admission medications   Medication Sig Start Date End Date Taking? Authorizing Provider  ipratropium (ATROVENT HFA) 17 MCG/ACT inhaler Inhale 1 puff into the lungs 3 (three) times daily. 02/24/23 02/24/24 Yes Emeline General, MD  acetaminophen  (TYLENOL) 500 MG tablet Take 1,000 mg by mouth 3 (three) times daily as needed.    [provider]  albuterol (VENTOLIN HFA) 108 (90 Base) MCG/ACT inhaler INHALE 1-2 PUFFS BY MOUTH EVERY 6 HOURS AS NEEDED FOR WHEEZE OR SHORTNESS OF BREATH 10/25/22   Rema Fendt, NP  amLODipine (NORVASC) 2.5 MG tablet Take 1 tablet (2.5 mg total) by mouth daily. 09/10/22 01/17/23  Rema Fendt, NP  atorvastatin (LIPITOR) 20 MG tablet Take 1 tablet (20 mg total) by mouth daily. 01/17/23   Chilton Si, MD  Blood Glucose Monitoring Suppl [facility] VERIO REFLECT) w/Device KIT 1 each by Does not apply route daily. E11.9    [provider]  cetirizine (ZYRTEC) 5 MG tablet Take 1 tablet (5 mg total) by mouth daily. 05/31/21   Wallis Bamberg, PA-C  diclofenac Sodium (VOLTAREN) 1 % GEL Apply 2 g topically 4 (four) times daily as needed. 11/01/21   Chilton Si, MD  furosemide (LASIX) 20 MG tablet Take 1 tablet (20 mg total) by mouth 2 (two) times daily for 5 days. 01/13/23 01/18/23  Pollyann Savoy, MD  Glucose Blood (ONETOUCH VERIO VI) 1 each by Other route daily. E11.9    [provider]  glucose blood (ONETOUCH VERIO) test strip Use to check blood sugars once a day 06/15/22   Georganna Skeans, MD  ibuprofen (ADVIL) 800 MG tablet Take 1 tablet (800 mg total) by mouth every 8 (eight) hours as needed. 06/14/21   Georganna Skeans, MD  loratadine (CLARITIN) 10 MG tablet Take 1 tablet (10 mg total) by mouth daily as needed for allergies. 02/21/23   Zenia Resides, MD  Multiple Vitamins-Minerals (HAIR SKIN AND NAILS FORMULA) TABS Take 1 tablet by mouth daily.    [provider]  nystatin (MYCOSTATIN/NYSTOP) powder Apply topically 4 (four) times daily. 03/17/19   Pincus Sanes, MD  nystatin cream (MYCOSTATIN) Apply 1 application topically 2 (two) times daily. 03/17/19   Pincus Sanes, MD  OneTouch Delica Lancets 30G MISC E11.9 06/15/22   Georganna Skeans, MD  predniSONE (DELTASONE) 20 MG tablet  Take 2 tablets (40 mg total) by mouth daily with breakfast for 3 days. 02/21/23 02/24/23  Zenia Resides, MD  pseudoephedrine (SUDAFED) 30 MG tablet Take 1 tablet (30 mg total) by mouth every 8 (eight) hours as needed for congestion. 05/31/21   Wallis Bamberg, PA-C  repaglinide (PRANDIN) 2 MG tablet Take 2 tablets (4 mg total) by mouth 2 (two) times daily before a meal. 09/10/22 01/17/23  Rema Fendt, NP  spironolactone (ALDACTONE) 25 MG tablet Take 1 tablet (25 mg total) by mouth daily. 09/10/22 01/17/23  Rema Fendt, NP  valsartan-hydrochlorothiazide (DIOVAN-HCT) 320-25 MG tablet Take 1 tablet by mouth daily. 01/17/23   Chilton Si, MD    Physical Exam: Vitals:   02/24/23 1007 02/24/23 1014 02/24/23 1244 02/24/23 1245  BP:    [name])  152/76  Pulse: 88 71  (!) 103  Resp: 15 19  13   Temp:   97.7 F (36.5 C)   TempSrc:   Oral   SpO2: 100% 100%  98%  Weight:      Height:        Constitutional: NAD, calm, comfortable Vitals:   02/24/23 1007 02/24/23 1014 02/24/23 1244 02/24/23 1245  BP:    (!) 152/76  Pulse: 88 71  (!) 103  Resp: 15 19  13   Temp:   97.7 F (36.5 C)   TempSrc:   Oral   SpO2: 100% 100%  98%  Weight:      Height:       Eyes: PERRL, lids and conjunctivae normal ENMT: Mucous membranes are moist. Posterior pharynx clear of any exudate or lesions.Normal dentition.  Neck: normal, supple, no masses, no thyromegaly Respiratory: clear to auscultation bilaterally, no wheezing, no crackles. Normal respiratory effort. No accessory muscle use.  Cardiovascular: Irregular heart rate, no murmurs / rubs / gallops. No extremity edema. 2+ pedal pulses. No carotid bruits.  Abdomen: no tenderness, no masses palpated. No hepatosplenomegaly. Bowel sounds positive.  Musculoskeletal: no clubbing / cyanosis. No joint deformity upper and lower extremities. Good ROM, no contractures. Normal muscle tone.  Skin: no rashes, lesions, ulcers. No induration Neurologic: CN 2-12 grossly intact.  Sensation intact, DTR normal. Strength 5/5 in all 4.  Psychiatric: Normal judgment and insight. Alert and oriented x 3. Normal mood.     Labs on Admission: I have personally reviewed following labs and imaging studies  CBC: Recent Labs  Lab 02/24/23 0909  WBC 8.4  HGB 12.0  HCT 39.0  MCV 88.4  PLT 324   Basic Metabolic Panel: Recent Labs  Lab 02/24/23 0909  NA 135  K 4.8  CL 106  CO2 20*  GLUCOSE 221*  BUN 32*  CREATININE 1.20*  CALCIUM 9.0  MG 1.9   GFR: Estimated Creatinine Clearance: 40.6 mL/min (A) (by C-G formula based on SCr of 1.2 mg/dL (H)). Liver Function Tests: No results for input(s): "AST", "ALT", "ALKPHOS", "BILITOT", "PROT", "ALBUMIN" in the last 168 hours. No results for input(s): "LIPASE", "AMYLASE" in the last 168 hours. No results for input(s): "AMMONIA" in the last 168 hours. Coagulation Profile: No results for input(s): "INR", "PROTIME" in the last 168 hours. Cardiac Enzymes: No results for input(s): "CKTOTAL", "CKMB", "CKMBINDEX", "TROPONINI" in the last 168 hours. BNP (last 3 results) No results for input(s): "PROBNP" in the last 8760 hours. HbA1C: No results for input(s): "HGBA1C" in the last 72 hours. CBG: Recent Labs  Lab 02/24/23 1123  GLUCAP 214*   Lipid Profile: No results for input(s): "CHOL", "HDL", "LDLCALC", "TRIG", "CHOLHDL", "LDLDIRECT" in the last 72 hours. Thyroid Function Tests: No results for input(s): "TSH", "T4TOTAL", "FREET4", "T3FREE", "THYROIDAB" in the last 72 hours. Anemia Panel: No results for input(s): "VITAMINB12", "FOLATE", "FERRITIN", "TIBC", "IRON", "RETICCTPCT" in the last 72 hours. Urine analysis:    Component Value Date/Time   COLORURINE YELLOW 01/12/2020 1350   APPEARANCEUR CLEAR 01/12/2020 1350   LABSPEC 1.020 01/12/2020 1350   PHURINE 6.0 01/12/2020 1350   GLUCOSEU >=1000 (A) 01/12/2020 1350   HGBUR NEGATIVE 01/12/2020 1350   BILIRUBINUR NEGATIVE 01/12/2020 1350   KETONESUR NEGATIVE 01/12/2020  1350   UROBILINOGEN 0.2 01/12/2020 1350   NITRITE NEGATIVE 01/12/2020 1350   LEUKOCYTESUR NEGATIVE 01/12/2020 1350    Radiological Exams on Admission: CT Cervical Spine Wo Contrast  Result Date: 02/24/2023 CLINICAL DATA:  82 year old female  with syncope, chest tightness, shortness of breath. Pain and nausea. EXAM: CT CERVICAL SPINE WITHOUT CONTRAST TECHNIQUE: Multidetector CT imaging of the cervical spine was performed without intravenous contrast. Multiplanar CT image reconstructions were also generated. RADIATION DOSE REDUCTION: This exam was performed according to the departmental dose-optimization program which includes automated exposure control, adjustment of the mA and/or kV according to patient size and/or use of iterative reconstruction technique. COMPARISON:  Head CT today. FINDINGS: Alignment: Normal cervical lordosis. Cervicothoracic junction alignment is within normal limits. Bilateral posterior element alignment is within normal limits. Skull base and vertebrae: Bone mineralization is within normal limits for age. Visualized skull base is intact. No atlanto-occipital dissociation. C1 and C2 appear intact and aligned. No acute osseous abnormality identified. Soft tissues and spinal canal: No prevertebral fluid or swelling. No visible canal hematoma. Bulky calcified proximal right ICA atherosclerosis, otherwise negative visible noncontrast neck soft tissues. Disc levels: Upper cervical facet arthropathy but otherwise mild for age cervical spine degeneration. Upper chest: Calcified aortic atherosclerosis. Mild respiratory motion. Visible upper thoracic levels appear grossly intact. IMPRESSION: 1. No acute traumatic injury identified in the cervical spine. 2. Mild for age cervical spine degeneration aside from upper cervical facet arthropathy. 3. Bulky calcified right carotid atherosclerosis. Electronically Signed   By: Odessa Fleming M.D.   On: 02/24/2023 10:44   CT Head Wo Contrast  Result Date:  02/24/2023 CLINICAL DATA:  82 year old female with syncope, chest tightness, shortness of breath. Pain and nausea. EXAM: CT HEAD WITHOUT CONTRAST TECHNIQUE: Contiguous axial images were obtained from the base of the skull through the vertex without intravenous contrast. RADIATION DOSE REDUCTION: This exam was performed according to the departmental dose-optimization program which includes automated exposure control, adjustment of the mA and/or kV according to patient size and/or use of iterative reconstruction technique. COMPARISON:  None Available. FINDINGS: Brain: Occasional small areas of dural calcification (normal variant more pronounced in the right hemisphere. Cerebral volume is within normal limits for age. No midline shift, ventriculomegaly, mass effect, evidence of mass lesion, intracranial hemorrhage or evidence of cortically based acute infarction. Gray-white matter differentiation mostly normal for age. There is minimal to mild left frontal lobe white matter hypodensity. No cortical encephalomalacia. Vascular: Calcified atherosclerosis at the skull base. No suspicious intracranial vascular hyperdensity. Skull: No acute osseous abnormality identified. Sinuses/Orbits: Visualized paranasal sinuses and mastoids are clear. Other: No orbit or scalp soft tissue injury identified. IMPRESSION: 1. No acute intracranial abnormality or acute traumatic injury identified. 2. Largely normal for age; minimal cerebral white matter changes, calcified atherosclerosis. Electronically Signed   By: Odessa Fleming M.D.   On: 02/24/2023 10:42   DG Chest Portable 1 View  Result Date: 02/24/2023 CLINICAL DATA:  Chest pain.  Syncopal episode. EXAM: PORTABLE CHEST 1 VIEW COMPARISON:  01/13/2023. FINDINGS: Cardiac silhouette is mildly enlarged. No mediastinal or hilar masses. Clear lungs.  No pleural effusion or pneumothorax. Skeletal structures are grossly intact. IMPRESSION: No active disease. Electronically Signed   By: Amie Portland  M.D.   On: 02/24/2023 09:55    EKG: Independently reviewed.   Assessment/Plan Principal Problem:   Afib (HCC) Active Problems:   Acute bronchitis   A-fib (HCC)   Syncope, vasovagal  (please populate well all problems here in Problem List. (For example, if patient is on BP meds at home and you resume or decide to hold them, it is a problem that needs to be her. Same for CAD, COPD, HLD and so on)  Syncope -Appears to be vasovagal,  probably secondary to new onset of A-fib -Currently on Cardizem drip for rate control. -Telemonitoring x 24 hours -Electrolytes including K and magnesium WNL -Orthostatic vital signs -PT evaluation -Other Ddx, hypoglycemia less likely given the recent A1c and fingerstick reading in the ED.  Her asthma has been improved since Friday, unlikely to have hypoxic or hypercapnic at this point.  A-fib with RVR, new onset -On Cardizem drip -Start metoprolol 5 mg twice daily to titrate Cardizem drip -CHADS2=3, indication for systemic anticoagulation, will start Eliquis twice daily, consult pharmacy.  Elevated troponins -NSTEMI versus demand ischemia.  Given the new onset of A-fib, more compatible with demanding ischemia at this point. -Start aspirin and continue statin -Start beta-blocker, and patient already on ARB -Echocardiogram -Trend troponin level  Moderate asthma -Improving, discontinue albuterol -Start Atrovent as needed for breathing treatment -Start Pulmicort -Send scription Atrovent and Pulmicort to her pharmacy  HTN, chronic HFpEF -Euvolemic, no indication for diuresis at this point -Hold up low-dose of amlodipine -Start beta-blocker to address post A-fib and HTN -Continue ARB/HCTZ, continue spironolactone  DVT prophylaxis: Eliquis Code Status: Full code Family Communication: Daughter and granddaughter at bedside Disposition Plan: Expect less than 2 midnight hospital stay Consults called: Cardiology Admission status: PCU for Cardizem  drip   Emeline General MD [facility] Pager [phone]  02/24/2023, 1:38 PM

## 2023-02-24 NOTE — ED Notes (Signed)
Per Everlene Farrier in lab, Troponin 127, Lynelle Doctor, MD notified.

## 2023-02-24 NOTE — Consult Note (Signed)
Cardiology Consultation   Patient ID: Desha Bolding MRN: [id number]; DOB: 06/20/1941  Admit date: 02/24/2023 Date of Consult: 02/24/2023  PCP:  Rema Fendt, NP   [facility] Providers Cardiologist:  None   {   History of Present Illness:   Ms. Cobos is an 82yo woman who I am seeing today for new AF at the request of Dr Lynelle Doctor. She has a medical history of HFpEF, DM, HTH, HLD.  She has been feeling poorly over the last few days with epigastric burning pain.  The pain was localized to the epigastrium and did not radiate.  No significant shortness of breath, nausea, vomiting.  No recent illnesses.  Did see Dr. Duke Salvia in the hypertension clinic recently at [facility].  Some of her medications were adjusted.  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  She does not recall any preceding symptoms.  Her coworkers said she was unconscious for 2 minutes.  In the emergency department she is feeling back to her normal self.  Her daughter is at the bedside.  No prior syncopal history.    Past medical, surgical, family and social history reviewed.         ROS:  Please see the history of present illness.  All other ROS reviewed and negative.     Physical Exam/Data:   Vitals:   02/24/23 1007 02/24/23 1014 02/24/23 1244 02/24/23 1245  BP:    (!) 152/76  Pulse: 88 71  (!) 103  Resp: 15 19  13   Temp:   97.7 F (36.5 C)   TempSrc:   Oral   SpO2: 100% 100%  98%  Weight:      Height:       No intake or output data in the 24 hours ending 02/24/23 1257    02/24/2023    9:01 AM 01/17/2023    8:15 AM 01/13/2023   12:26 AM  Last 3 Weights  Weight (lbs) 200 lb 200 lb 9.6 oz 190 lb  Weight (kg) 90.719 kg 90.992 kg 86.183 kg     Body mass index is 35.43 kg/m.   General:  Well nourished, well developed, in no acute distress.  Obese Cardiac:  normal S1, S2; RRR; 2 out  of 6 systolic ejection murmur at the left sternal borders Lungs:  clear to auscultation bilaterally, no wheezing, rhonchi or rales  Psych:  Normal affect   EKG:  The EKG was personally reviewed and demonstrates:  AF with rates ~ 100  Telemetry:  Telemetry was personally reviewed and demonstrates: A-fib  Relevant CV Studies:  01/09/2018 Echo shows EF 65-70. LVOT gradient .      Assessment and Plan:   Ms Saffer is an 82yo woman with a history of HFpEF, HTN, HLD who presents after a syncopal episode for which she was transported to the ER and found to be in new onset AF.  #AF New diagnosis.  CHA2DS2-VASc Score = 6  The patient's score is based upon: CHF History: 1 HTN History: 1 Diabetes History: 1 Stroke History: 0 Vascular Disease History: 0 Age Score: 2 Gender Score: 1  Start heparin for stroke prophylaxis. Continue diltiazem drip.  Will likely transition to long-acting metoprolol succinate during this hospitalization.  Await echo results.  #Elevated troponin and epigastric pain Unclear if her epigastric pain is related to a cardiac cause or not.  Could be GI related although  first troponin is elevated from her baseline. Trend troponins Echo pending Plan for coronary evaluation while here.  CTA coronary if we can get her heart rates controlled.  If heart rate control remains an issue, consider left heart cath.  #LVOT Gradient On prior echo. ? Could have contributed to syncope Repeat TTE pending.   For questions or updates, please contact [facility] Please consult [email or web address] for contact info under    Signed, Lanier Prude, MD  02/24/2023 12:57 PM

## 2023-02-24 NOTE — Progress Notes (Signed)
ANTICOAGULATION CONSULT NOTE  Pharmacy Consult for Heparin Indication: atrial fibrillation  Allergies  Allergen Reactions   Aspirin Nausea And Vomiting and Other (See Comments)    Uncoated version = "Jittery and GI upset" (has tolerated the chewable version)   Atorvastatin Other (See Comments)    Caused aching in the body   Codeine Sulfate Nausea And Vomiting and Other (See Comments)    Heart races also   Glimepiride Diarrhea   Metformin Diarrhea   Prandin [Repaglinide] Other (See Comments)    "it upset my stomach"   Propoxyphene N-Acetaminophen Nausea And Vomiting    Patient Measurements: Height: 5\' 3"  (160 cm) Weight: 90.7 kg (200 lb) IBW/kg (Calculated) : 52.4 Heparin Dosing Weight: 73.1 kg  Vital Signs: Temp: 98.1 F (36.7 C) (06/02 2020) Temp Source: Oral (06/02 2020) BP: 104/67 (06/02 2020) Pulse Rate: 63 (06/02 2020)  Labs: Recent Labs    02/24/23 0909 02/24/23 1236 02/24/23 1944 02/24/23 2140  HGB 12.0  --   --   --   HCT 39.0  --   --   --   PLT 324  --   --   --   HEPARINUNFRC  --   --   --  0.66  CREATININE 1.20*  --   --   --   TROPONINIHS 127* 159* 217*  --      Estimated Creatinine Clearance: 40.6 mL/min (A) (by C-G formula based on SCr of 1.2 mg/dL (H)).   Medical History: Past Medical History:  Diagnosis Date   (HFpEF) heart failure with preserved ejection fraction (HCC) 01/17/2023   Allergic rhinitis    Diabetes mellitus, type 2 (HCC)    Hyperlipemia    Hypertension    Left shoulder pain 11/01/2021   Lipoma NEC    anterior upper chest   Osteoarthritis of knee    Shortness of breath 11/01/2021    Medications:  Medications Prior to Admission  Medication Sig Dispense Refill Last Dose   acetaminophen (TYLENOL) 500 MG tablet Take 500 mg by mouth every 8 (eight) hours as needed for mild pain or headache.   unk   albuterol (VENTOLIN HFA) 108 (90 Base) MCG/ACT inhaler INHALE 1-2 PUFFS BY MOUTH EVERY 6 HOURS AS NEEDED FOR WHEEZE OR  SHORTNESS OF BREATH (Patient taking differently: Inhale 1-2 puffs into the lungs every 6 (six) hours as needed for wheezing or shortness of breath.) 18 each 1 02/22/2023   amLODipine (NORVASC) 2.5 MG tablet Take 1 tablet (2.5 mg total) by mouth daily. 30 tablet 2 02/24/2023 at am   fluticasone (FLONASE) 50 MCG/ACT nasal spray Place 2 sprays into both nostrils daily as needed for allergies or rhinitis.   unk   loratadine (CLARITIN) 10 MG tablet Take 1 tablet (10 mg total) by mouth daily as needed for allergies. (Patient taking differently: Take 10 mg by mouth in the morning.) 30 tablet 0 02/23/2023 at am   Multiple Vitamins-Minerals (HAIR SKIN AND NAILS FORMULA) TABS Take 1 tablet by mouth daily.   Past Week   nystatin (MYCOSTATIN/NYSTOP) powder Apply topically 4 (four) times daily. (Patient taking differently: Apply topically 4 (four) times daily as needed (for rashes).) 60 g 0 unk   nystatin cream (MYCOSTATIN) Apply 1 application topically 2 (two) times daily. (Patient taking differently: Apply 1 application  topically 2 (two) times daily as needed for dry skin (or rashes).) 30 g 1 unk   predniSONE (DELTASONE) 20 MG tablet Take 2 tablets (40 mg total) by mouth daily with  breakfast for 3 days. 6 tablet 0 02/22/2023 at SEE NOTE   spironolactone (ALDACTONE) 25 MG tablet Take 1 tablet (25 mg total) by mouth daily. 30 tablet 2 02/24/2023 at am   valsartan-hydrochlorothiazide (DIOVAN-HCT) 320-25 MG tablet Take 1 tablet by mouth daily. 90 tablet 3 02/24/2023 at am   atorvastatin (LIPITOR) 20 MG tablet Take 1 tablet (20 mg total) by mouth daily. (Patient not taking: Reported on 02/24/2023) 90 tablet 3 Not Taking   Blood Glucose Monitoring Suppl (ONETOUCH VERIO REFLECT) w/Device KIT 1 each by Does not apply route daily. E11.9      cetirizine (ZYRTEC) 5 MG tablet Take 1 tablet (5 mg total) by mouth daily. (Patient not taking: Reported on 02/24/2023) 90 tablet 0 Not Taking   diclofenac Sodium (VOLTAREN) 1 % GEL Apply 2 g  topically 4 (four) times daily as needed. (Patient not taking: Reported on 02/24/2023) 50 g 1 Not Taking   furosemide (LASIX) 20 MG tablet Take 1 tablet (20 mg total) by mouth 2 (two) times daily for 5 days. (Patient not taking: Reported on 02/24/2023) 10 tablet 0 Not Taking   Glucose Blood (ONETOUCH VERIO VI) 1 each by Other route daily. E11.9      glucose blood (ONETOUCH VERIO) test strip Use to check blood sugars once a day 100 strip 5    ibuprofen (ADVIL) 800 MG tablet Take 1 tablet (800 mg total) by mouth every 8 (eight) hours as needed. (Patient not taking: Reported on 02/24/2023) 30 tablet 1 Not Taking   OneTouch Delica Lancets 30G MISC E11.9 100 each 3    pseudoephedrine (SUDAFED) 30 MG tablet Take 1 tablet (30 mg total) by mouth every 8 (eight) hours as needed for congestion. (Patient not taking: Reported on 02/24/2023) 30 tablet 0 Not Taking   repaglinide (PRANDIN) 2 MG tablet Take 2 tablets (4 mg total) by mouth 2 (two) times daily before a meal. (Patient not taking: Reported on 02/24/2023) 120 tablet 2 Not Taking    Scheduled:   [START ON 02/25/2023] aspirin EC  81 mg Oral Daily   atorvastatin  20 mg Oral Daily   budesonide (PULMICORT) nebulizer solution  0.25 mg Nebulization BID   [START ON 02/25/2023] irbesartan  300 mg Oral Daily   And   [START ON 02/25/2023] hydrochlorothiazide  25 mg Oral Daily   insulin aspart  0-15 Units Subcutaneous TID WC   loratadine  10 mg Oral Daily   metoprolol tartrate  25 mg Oral BID   repaglinide  4 mg Oral BID AC   [START ON 02/25/2023] spironolactone  25 mg Oral Daily   Infusions:   diltiazem (CARDIZEM) infusion 5 mg/hr (02/24/23 1236)   heparin 1,100 Units/hr (02/24/23 1545)   PRN: acetaminophen, ipratropium, nitroGLYCERIN, ondansetron (ZOFRAN) IV  Assessment: 74 yof with a history of HFpEF, HTN, HLD. Patient is presenting with new onset AF. Heparin per pharmacy consult placed for atrial fibrillation.  Patient is not on anticoagulation prior to  arrival.  Initial heparin level is therapeutic at 0.66.  Goal of Therapy:  Heparin level 0.3-0.7 units/ml Monitor platelets by anticoagulation protocol: Yes   Plan:  Continue heparin 1100 units/h Check confirmatory heparin level with am labs  Fredonia Highland, PharmD, BCPS, [facility] Clinical Pharmacist [phone] Please check AMION for all [facility] numbers 02/24/2023

## 2023-02-25 ENCOUNTER — Other Ambulatory Visit (HOSPITAL_COMMUNITY): Payer: Self-pay

## 2023-02-25 ENCOUNTER — Observation Stay (HOSPITAL_BASED_OUTPATIENT_CLINIC_OR_DEPARTMENT_OTHER): Payer: PPO

## 2023-02-25 ENCOUNTER — Telehealth (HOSPITAL_BASED_OUTPATIENT_CLINIC_OR_DEPARTMENT_OTHER): Payer: Self-pay

## 2023-02-25 ENCOUNTER — Encounter (HOSPITAL_COMMUNITY)
Admission: EM | Disposition: A | Discharge status: Home / Self Care | Payer: Self-pay | Source: Home / Self Care | Attending: Emergency Medicine

## 2023-02-25 DIAGNOSIS — R072 Precordial pain: Secondary | ICD-10-CM

## 2023-02-25 DIAGNOSIS — I4819 Other persistent atrial fibrillation: Secondary | ICD-10-CM | POA: Diagnosis not present

## 2023-02-25 DIAGNOSIS — I249 Acute ischemic heart disease, unspecified: Secondary | ICD-10-CM | POA: Diagnosis not present

## 2023-02-25 DIAGNOSIS — I5031 Acute diastolic (congestive) heart failure: Secondary | ICD-10-CM | POA: Diagnosis not present

## 2023-02-25 DIAGNOSIS — I480 Paroxysmal atrial fibrillation: Secondary | ICD-10-CM | POA: Diagnosis not present

## 2023-02-25 HISTORY — PX: LEFT HEART CATH AND CORONARY ANGIOGRAPHY: CATH118249

## 2023-02-25 LAB — CBC
HCT: 37.4 % (ref 36.0–46.0)
Hemoglobin: 11.7 g/dL — ABNORMAL LOW (ref 12.0–15.0)
MCH: 27.1 pg (ref 26.0–34.0)
MCHC: 31.3 g/dL (ref 30.0–36.0)
MCV: 86.6 fL (ref 80.0–100.0)
Platelets: 318 10*3/uL (ref 150–400)
RBC: 4.32 MIL/uL (ref 3.87–5.11)
RDW: 14.7 % (ref 11.5–15.5)
WBC: 9.4 10*3/uL (ref 4.0–10.5)
nRBC: 0 % (ref 0.0–0.2)

## 2023-02-25 LAB — ECHOCARDIOGRAM COMPLETE
AR max vel: 1.34 cm2
AV Peak grad: 9.1 mmHg
Ao pk vel: 1.51 m/s
Area-P 1/2: 4.15 cm2
Height: 63 in
MV M vel: 4.86 m/s
MV Peak grad: 94.5 mmHg
S' Lateral: 2.5 cm
Weight: 3200 oz

## 2023-02-25 LAB — GLUCOSE, CAPILLARY
Glucose-Capillary: 221 mg/dL — ABNORMAL HIGH (ref 70–99)
Glucose-Capillary: 243 mg/dL — ABNORMAL HIGH (ref 70–99)
Glucose-Capillary: 158 mg/dL — ABNORMAL HIGH (ref 70–99)
Glucose-Capillary: 235 mg/dL — ABNORMAL HIGH (ref 70–99)
Glucose-Capillary: 145 mg/dL — ABNORMAL HIGH (ref 70–99)

## 2023-02-25 LAB — HEPARIN LEVEL (UNFRACTIONATED): Heparin Unfractionated: 0.66 IU/mL (ref 0.30–0.70)

## 2023-02-25 SURGERY — LEFT HEART CATH AND CORONARY ANGIOGRAPHY
Anesthesia: LOCAL

## 2023-02-25 MED ORDER — HEPARIN SODIUM (PORCINE) 1000 UNIT/ML IJ SOLN
INTRAMUSCULAR | Status: AC
  Filled 2023-02-25: qty 10

## 2023-02-25 MED ORDER — SODIUM CHLORIDE 0.9% FLUSH: 3.0000 mL | INTRAVENOUS | Status: DC | PRN

## 2023-02-25 MED ORDER — LIDOCAINE HCL (PF) 1 % IJ SOLN
INTRAMUSCULAR | Status: DC | PRN
  Administered 2023-02-25: 2 mL

## 2023-02-25 MED ORDER — MIDAZOLAM HCL 2 MG/2ML IJ SOLN
INTRAMUSCULAR | Status: DC | PRN
  Administered 2023-02-25: 1 mg via INTRAVENOUS

## 2023-02-25 MED ORDER — VERAPAMIL HCL 2.5 MG/ML IV SOLN
INTRAVENOUS | Status: AC
  Filled 2023-02-25: qty 2

## 2023-02-25 MED ORDER — MIDAZOLAM HCL 2 MG/2ML IJ SOLN
INTRAMUSCULAR | Status: AC
  Filled 2023-02-25: qty 2

## 2023-02-25 MED ORDER — ASPIRIN 81 MG PO CHEW: 81.0000 mg | CHEWABLE_TABLET | ORAL | Status: DC

## 2023-02-25 MED ORDER — HEPARIN (PORCINE) IN NACL 1000-0.9 UT/500ML-% IV SOLN
INTRAVENOUS | Status: DC | PRN
  Administered 2023-02-25 (×2): 500 mL via INTRA_ARTERIAL

## 2023-02-25 MED ORDER — VERAPAMIL HCL 2.5 MG/ML IV SOLN
INTRAVENOUS | Status: DC | PRN
  Administered 2023-02-25: 10 mL via INTRA_ARTERIAL

## 2023-02-25 MED ORDER — SODIUM CHLORIDE 0.9 % IV SOLN: 250.0000 mL | INTRAVENOUS | Status: DC | PRN

## 2023-02-25 MED ORDER — SODIUM CHLORIDE 0.9 % IV SOLN: INTRAVENOUS | Status: AC

## 2023-02-25 MED ORDER — SODIUM CHLORIDE 0.9% FLUSH
3.0000 mL | Freq: Two times a day (BID) | INTRAVENOUS | Status: DC
  Administered 2023-02-27: 3 mL via INTRAVENOUS

## 2023-02-25 MED ORDER — SODIUM CHLORIDE 0.9 % IV SOLN: INTRAVENOUS | Status: DC

## 2023-02-25 MED ORDER — FENTANYL CITRATE (PF) 100 MCG/2ML IJ SOLN
INTRAMUSCULAR | Status: DC | PRN
  Administered 2023-02-25: 25 ug via INTRAVENOUS

## 2023-02-25 MED ORDER — HEPARIN (PORCINE) 25000 UT/250ML-% IV SOLN
1250.0000 [IU]/h | INTRAVENOUS | Status: DC
  Administered 2023-02-25: 1100 [IU]/h via INTRAVENOUS

## 2023-02-25 MED ORDER — LIDOCAINE HCL (PF) 1 % IJ SOLN
INTRAMUSCULAR | Status: AC
  Filled 2023-02-25: qty 30

## 2023-02-25 MED ORDER — HEPARIN SODIUM (PORCINE) 1000 UNIT/ML IJ SOLN
INTRAMUSCULAR | Status: DC | PRN
  Administered 2023-02-25: 4500 [IU] via INTRAVENOUS

## 2023-02-25 MED ORDER — IOHEXOL 350 MG/ML SOLN
INTRAVENOUS | Status: DC | PRN
  Administered 2023-02-25: 40 mL via INTRA_ARTERIAL

## 2023-02-25 MED ORDER — HYDRALAZINE HCL 20 MG/ML IJ SOLN: 10.0000 mg | INTRAMUSCULAR | Status: AC | PRN

## 2023-02-25 MED ORDER — FENTANYL CITRATE (PF) 100 MCG/2ML IJ SOLN
INTRAMUSCULAR | Status: AC
  Filled 2023-02-25: qty 2

## 2023-02-25 SURGICAL SUPPLY — 11 items
BAND CMPR LRG ZPHR (HEMOSTASIS) ×1
BAND ZEPHYR COMPRESS 30 LONG (HEMOSTASIS) IMPLANT
CATH 5FR JL3.5 JR4 ANG PIG MP (CATHETERS) IMPLANT
GLIDESHEATH SLEND SS 6F .021 (SHEATH) IMPLANT
GUIDEWIRE INQWIRE 1.5J.035X260 (WIRE) IMPLANT
INQWIRE 1.5J .035X260CM (WIRE) ×1
KIT HEART LEFT (KITS) ×2 IMPLANT
PACK CARDIAC CATHETERIZATION (CUSTOM PROCEDURE TRAY) ×2 IMPLANT
SHEATH PROBE COVER 6X72 (BAG) IMPLANT
TRANSDUCER W/STOPCOCK (MISCELLANEOUS) ×2 IMPLANT
TUBING CIL FLEX 10 FLL-RA (TUBING) ×2 IMPLANT

## 2023-02-25 NOTE — Progress Notes (Signed)
ANTICOAGULATION CONSULT NOTE  Pharmacy Consult for Heparin Indication: atrial fibrillation  Allergies  Allergen Reactions   Aspirin Nausea And Vomiting and Other (See Comments)    Uncoated version = "Jittery and GI upset" (has tolerated the chewable version)   Atorvastatin Other (See Comments)    Caused aching in the body   Codeine Sulfate Nausea And Vomiting and Other (See Comments)    Heart races also   Glimepiride Diarrhea   Metformin Diarrhea   Prandin [Repaglinide] Other (See Comments)    "it upset my stomach"   Propoxyphene N-Acetaminophen Nausea And Vomiting    Patient Measurements: Height: 5\' 3"  (160 cm) Weight: 90.7 kg (200 lb) IBW/kg (Calculated) : 52.4 Heparin Dosing Weight: 73.1 kg  Vital Signs: Temp: 97.9 F (36.6 C) (06/03 0746) Temp Source: Oral (06/03 0746) BP: 128/70 (06/03 0746) Pulse Rate: 81 (06/03 0746)  Labs: Recent Labs    02/24/23 0909 02/24/23 1236 02/24/23 1944 02/24/23 2140 02/25/23 0446  HGB 12.0  --   --   --  11.7*  HCT 39.0  --   --   --  37.4  PLT 324  --   --   --  318  HEPARINUNFRC  --   --   --  0.66 0.66  CREATININE 1.20*  --   --   --   --   TROPONINIHS 127* 159* 217*  --   --      Estimated Creatinine Clearance: 40.6 mL/min (A) (by C-G formula based on SCr of 1.2 mg/dL (H)).   Medical History: Past Medical History:  Diagnosis Date   (HFpEF) heart failure with preserved ejection fraction (HCC) 01/17/2023   Allergic rhinitis    Diabetes mellitus, type 2 (HCC)    Hyperlipemia    Hypertension    Left shoulder pain 11/01/2021   Lipoma NEC    anterior upper chest   Osteoarthritis of knee    Shortness of breath 11/01/2021    Medications:  Medications Prior to Admission  Medication Sig Dispense Refill Last Dose   acetaminophen (TYLENOL) 500 MG tablet Take 500 mg by mouth every 8 (eight) hours as needed for mild pain or headache.   unk   albuterol (VENTOLIN HFA) 108 (90 Base) MCG/ACT inhaler INHALE 1-2 PUFFS BY MOUTH  EVERY 6 HOURS AS NEEDED FOR WHEEZE OR SHORTNESS OF BREATH (Patient taking differently: Inhale 1-2 puffs into the lungs every 6 (six) hours as needed for wheezing or shortness of breath.) 18 each 1 02/22/2023   amLODipine (NORVASC) 2.5 MG tablet Take 1 tablet (2.5 mg total) by mouth daily. 30 tablet 2 02/24/2023 at am   fluticasone (FLONASE) 50 MCG/ACT nasal spray Place 2 sprays into both nostrils daily as needed for allergies or rhinitis.   unk   loratadine (CLARITIN) 10 MG tablet Take 1 tablet (10 mg total) by mouth daily as needed for allergies. (Patient taking differently: Take 10 mg by mouth in the morning.) 30 tablet 0 02/23/2023 at am   Multiple Vitamins-Minerals (HAIR SKIN AND NAILS FORMULA) TABS Take 1 tablet by mouth daily.   Past Week   nystatin (MYCOSTATIN/NYSTOP) powder Apply topically 4 (four) times daily. (Patient taking differently: Apply topically 4 (four) times daily as needed (for rashes).) 60 g 0 unk   nystatin cream (MYCOSTATIN) Apply 1 application topically 2 (two) times daily. (Patient taking differently: Apply 1 application  topically 2 (two) times daily as needed for dry skin (or rashes).) 30 g 1 unk   [EXPIRED] predniSONE (DELTASONE)  20 MG tablet Take 2 tablets (40 mg total) by mouth daily with breakfast for 3 days. 6 tablet 0 02/22/2023 at SEE NOTE   spironolactone (ALDACTONE) 25 MG tablet Take 1 tablet (25 mg total) by mouth daily. 30 tablet 2 02/24/2023 at am   valsartan-hydrochlorothiazide (DIOVAN-HCT) 320-25 MG tablet Take 1 tablet by mouth daily. 90 tablet 3 02/24/2023 at am   atorvastatin (LIPITOR) 20 MG tablet Take 1 tablet (20 mg total) by mouth daily. (Patient not taking: Reported on 02/24/2023) 90 tablet 3 Not Taking   Blood Glucose Monitoring Suppl (ONETOUCH VERIO REFLECT) w/Device KIT 1 each by Does not apply route daily. E11.9      cetirizine (ZYRTEC) 5 MG tablet Take 1 tablet (5 mg total) by mouth daily. (Patient not taking: Reported on 02/24/2023) 90 tablet 0 Not Taking    diclofenac Sodium (VOLTAREN) 1 % GEL Apply 2 g topically 4 (four) times daily as needed. (Patient not taking: Reported on 02/24/2023) 50 g 1 Not Taking   furosemide (LASIX) 20 MG tablet Take 1 tablet (20 mg total) by mouth 2 (two) times daily for 5 days. (Patient not taking: Reported on 02/24/2023) 10 tablet 0 Not Taking   Glucose Blood (ONETOUCH VERIO VI) 1 each by Other route daily. E11.9      glucose blood (ONETOUCH VERIO) test strip Use to check blood sugars once a day 100 strip 5    ibuprofen (ADVIL) 800 MG tablet Take 1 tablet (800 mg total) by mouth every 8 (eight) hours as needed. (Patient not taking: Reported on 02/24/2023) 30 tablet 1 Not Taking   OneTouch Delica Lancets 30G MISC E11.9 100 each 3    pseudoephedrine (SUDAFED) 30 MG tablet Take 1 tablet (30 mg total) by mouth every 8 (eight) hours as needed for congestion. (Patient not taking: Reported on 02/24/2023) 30 tablet 0 Not Taking   repaglinide (PRANDIN) 2 MG tablet Take 2 tablets (4 mg total) by mouth 2 (two) times daily before a meal. (Patient not taking: Reported on 02/24/2023) 120 tablet 2 Not Taking    Scheduled:   aspirin EC  81 mg Oral Daily   atorvastatin  20 mg Oral Daily   budesonide (PULMICORT) nebulizer solution  0.25 mg Nebulization BID   irbesartan  300 mg Oral Daily   And   hydrochlorothiazide  25 mg Oral Daily   insulin aspart  0-15 Units Subcutaneous TID WC   loratadine  10 mg Oral Daily   metoprolol tartrate  25 mg Oral BID   repaglinide  4 mg Oral BID AC   spironolactone  25 mg Oral Daily   Infusions:   diltiazem (CARDIZEM) infusion 5 mg/hr (02/24/23 1236)   heparin 1,100 Units/hr (02/24/23 1545)   PRN: acetaminophen, ipratropium, nitroGLYCERIN, ondansetron (ZOFRAN) IV  Assessment: 35 yof with a history of HFpEF, HTN, HLD. Patient is presenting with new onset AF. Heparin per pharmacy consult placed for atrial fibrillation.  Patient is not on anticoagulation prior to arrival.  Initial heparin level is  therapeutic at 0.66.  Goal of Therapy:  Heparin level 0.3-0.7 units/ml Monitor platelets by anticoagulation protocol: Yes   Plan:  Continue heparin 1100 units/h Daily heparin level and CBC  Harland German, PharmD Clinical Pharmacist **Pharmacist phone directory can now be found on [email or web address] ([email or web address]).  Listed under [facility].

## 2023-02-25 NOTE — TOC Benefit Eligibility Note (Signed)
Patient Advocate Encounter  Insurance verification completed.    The patient is currently admitted and upon discharge could be taking Eliquis 5 mg.  The current 30 day co-pay is $47.00.   The patient is currently admitted and upon discharge could be taking Xarelto 20 mg.  The current 30 day co-pay is $47.00.   The patient is insured through [employer] Medicare Part D   This test claim was processed through [facility]- copay amounts may vary at other pharmacies due to pharmacy/plan contracts, or as the patient moves through the different stages of their insurance plan.  Ignacio Lowder, CPHT Pharmacy Patient Advocate Specialist [facility] Patient Advocate Team Direct Number: [phone]  Fax: [phone]       

## 2023-02-25 NOTE — Plan of Care (Signed)

## 2023-02-25 NOTE — H&P (View-Only) (Signed)
Rounding Note    Patient Name: Rachel [name] Date of Encounter: 02/25/2023  [facility] Cardiologist: Chilton Si, MD   Subjective   No more chest burning  has noted some dyspnea over last few months. Since BP meds adjusted she has not felt well after taking  She has never hat this mid sternal chest pain in the past  Described as a burning  Lasted for several days Has resolved now that she is on medical treatment   Inpatient Medications    Scheduled Meds:  aspirin EC  81 mg Oral Daily   atorvastatin  20 mg Oral Daily   budesonide (PULMICORT) nebulizer solution  0.25 mg Nebulization BID   irbesartan  300 mg Oral Daily   And   hydrochlorothiazide  25 mg Oral Daily   insulin aspart  0-15 Units Subcutaneous TID WC   loratadine  10 mg Oral Daily   metoprolol tartrate  25 mg Oral BID   repaglinide  4 mg Oral BID AC   spironolactone  25 mg Oral Daily   Continuous Infusions:  diltiazem (CARDIZEM) infusion 5 mg/hr (02/24/23 1236)   heparin 1,100 Units/hr (02/24/23 1545)   PRN Meds: acetaminophen, ipratropium, nitroGLYCERIN, ondansetron (ZOFRAN) IV   Vital Signs    Vitals:   02/25/23 0510 02/25/23 0511 02/25/23 0512 02/25/23 0746  BP: 125/63 (!) 130/48 (!) 120/53 128/70  Pulse:    81  Resp:      Temp:    97.9 F (36.6 C)  TempSrc:    Oral  SpO2:      Weight:      Height:        Intake/Output Summary (Last 24 hours) at 02/25/2023 0815 Last data filed at 02/25/2023 0413 Gross per 24 hour  Intake 237.47 ml  Output --  Net 237.47 ml      02/24/2023    9:01 AM 01/17/2023    8:15 AM 01/13/2023   12:26 AM  Last 3 Weights  Weight (lbs) 200 lb 200 lb 9.6 oz 190 lb  Weight (kg) 90.719 kg 90.992 kg 86.183 kg      Telemetry    Atrial fib rate controlled - Personally Reviewed  ECG    No new - Personally Reviewed  Physical Exam   GEN: No acute distress.   Neck: No JVD Cardiac: irreg irreg, no murmurs, rubs, or gallops.  Respiratory:  Clear to auscultation bilaterally. GI: Soft, nontender, non-distended  MS: No edema; No deformity. Neuro:  Nonfocal  Psych: Normal affect   Labs    High Sensitivity Troponin:   Recent Labs  Lab 02/24/23 0909 02/24/23 1236 02/24/23 1944  TROPONINIHS 127* 159* 217*     Chemistry Recent Labs  Lab 02/24/23 0909  NA 135  K 4.8  CL 106  CO2 20*  GLUCOSE 221*  BUN 32*  CREATININE 1.20*  CALCIUM 9.0  MG 1.9  GFRNONAA 46*  ANIONGAP 9    Lipids No results for input(s): "CHOL", "TRIG", "HDL", "LABVLDL", "LDLCALC", "CHOLHDL" in the last 168 hours.  Hematology Recent Labs  Lab 02/24/23 0909 02/25/23 0446  WBC 8.4 9.4  RBC 4.41 4.32  HGB 12.0 11.7*  HCT 39.0 37.4  MCV 88.4 86.6  MCH 27.2 27.1  MCHC 30.8 31.3  RDW 14.9 14.7  PLT 324 318   Thyroid  Recent Labs  Lab 02/24/23 1236  TSH 1.996    BNPNo results for input(s): "BNP", "PROBNP" in the last 168 hours.  DDimer  Recent Labs  Lab 02/24/23 1544  DDIMER 0.82*     Radiology    CT Cervical Spine Wo Contrast  Result Date: 02/24/2023 CLINICAL DATA:  82 year old female with syncope, chest tightness, shortness of breath. Pain and nausea. EXAM: CT CERVICAL SPINE WITHOUT CONTRAST TECHNIQUE: Multidetector CT imaging of the cervical spine was performed without intravenous contrast. Multiplanar CT image reconstructions were also generated. RADIATION DOSE REDUCTION: This exam was performed according to the departmental dose-optimization program which includes automated exposure control, adjustment of the mA and/or kV according to patient size and/or use of iterative reconstruction technique. COMPARISON:  Head CT today. FINDINGS: Alignment: Normal cervical lordosis. Cervicothoracic junction alignment is within normal limits. Bilateral posterior element alignment is within normal limits. Skull base and vertebrae: Bone mineralization is within normal limits for age. Visualized skull base is intact. No atlanto-occipital  dissociation. C1 and C2 appear intact and aligned. No acute osseous abnormality identified. Soft tissues and spinal canal: No prevertebral fluid or swelling. No visible canal hematoma. Bulky calcified proximal right ICA atherosclerosis, otherwise negative visible noncontrast neck soft tissues. Disc levels: Upper cervical facet arthropathy but otherwise mild for age cervical spine degeneration. Upper chest: Calcified aortic atherosclerosis. Mild respiratory motion. Visible upper thoracic levels appear grossly intact. IMPRESSION: 1. No acute traumatic injury identified in the cervical spine. 2. Mild for age cervical spine degeneration aside from upper cervical facet arthropathy. 3. Bulky calcified right carotid atherosclerosis. Electronically Signed   By: Odessa Fleming M.D.   On: 02/24/2023 10:44   CT Head Wo Contrast  Result Date: 02/24/2023 CLINICAL DATA:  82 year old female with syncope, chest tightness, shortness of breath. Pain and nausea. EXAM: CT HEAD WITHOUT CONTRAST TECHNIQUE: Contiguous axial images were obtained from the base of the skull through the vertex without intravenous contrast. RADIATION DOSE REDUCTION: This exam was performed according to the departmental dose-optimization program which includes automated exposure control, adjustment of the mA and/or kV according to patient size and/or use of iterative reconstruction technique. COMPARISON:  None Available. FINDINGS: Brain: Occasional small areas of dural calcification (normal variant more pronounced in the right hemisphere. Cerebral volume is within normal limits for age. No midline shift, ventriculomegaly, mass effect, evidence of mass lesion, intracranial hemorrhage or evidence of cortically based acute infarction. Gray-white matter differentiation mostly normal for age. There is minimal to mild left frontal lobe white matter hypodensity. No cortical encephalomalacia. Vascular: Calcified atherosclerosis at the skull base. No suspicious intracranial  vascular hyperdensity. Skull: No acute osseous abnormality identified. Sinuses/Orbits: Visualized paranasal sinuses and mastoids are clear. Other: No orbit or scalp soft tissue injury identified. IMPRESSION: 1. No acute intracranial abnormality or acute traumatic injury identified. 2. Largely normal for age; minimal cerebral white matter changes, calcified atherosclerosis. Electronically Signed   By: Odessa Fleming M.D.   On: 02/24/2023 10:42   DG Chest Portable 1 View  Result Date: 02/24/2023 CLINICAL DATA:  Chest pain.  Syncopal episode. EXAM: PORTABLE CHEST 1 VIEW COMPARISON:  01/13/2023. FINDINGS: Cardiac silhouette is mildly enlarged. No mediastinal or hilar masses. Clear lungs.  No pleural effusion or pneumothorax. Skeletal structures are grossly intact. IMPRESSION: No active disease. Electronically Signed   By: Amie Portland M.D.   On: 02/24/2023 09:55    Cardiac Studies  Echo 01/09/2018: Study Conclusions  - Left ventricle: Wall thickness was increased in a pattern of    moderate LVH. Systolic function was vigorous. The estimated    ejection fraction was in the range of 65% to 70%. Wall motion was  normal; there were no regional wall motion abnormalities. Doppler    parameters are consistent with abnormal left ventricular    relaxation (grade 1 diastolic dysfunction).  - Aortic valve: Peak gradient 18 mmHg across the aortic valve    likely represents LV outflow tract gradient rather than valvular    AS, the valve appears to open well. Peak gradient (S): 18 mm Hg.  - Mitral valve: There was mild regurgitation.  - Right ventricle: The cavity size was normal. Systolic function    was normal.  - Tricuspid valve: Peak RV-RA gradient (S): 37 mm Hg.  - Pulmonary arteries: PA peak pressure: 40 mm Hg (S).  - Inferior vena cava: The vessel was normal in size. The    respirophasic diameter changes were in the normal range (>= 50%),    consistent with normal central venous pressure.   Impressions:   - Normal LV size with moderate LV hypertrophy. EF 65-70%, vigorous    systolic function. Normal RV size and systolic function. Mild    gradient across the LV outflow tract is likely due to    LVH/vigorous LV systolic function rather than valvular AS.      Patient Profile     82 y.o. female with hx of HTN, Asthma, DM, HFpEF followed in advanced HTN clinic admitted 02/24/23 with epigastric burning, syncope after standing and new atrial fib.  Assessment & Plan    Atrial fib new onset  --Cha2DS2VASc of 6 --IV heparin  will need AC oral once tests decided (no hx of GI Bleed, no hx colonoscopy) --IV dilt drip at 5 rate controlled continue until echo back, then could switch to po --Echo in past with LVOT gradient  ? Cause of syncope Echo pending this admit --pt is NPO --she was on pseudoephedrine at home, discussed not taking now with hx of atrial fib. --consider sleep study as outpt    Epigastric pain and elevated tropinin  --127>>159>>217 --could be from a fib vs a fib from ischemia --? Cardiac CTA vs cath --her mother a heavy smoker had CABG in her late 64s early 39s --on po ASA and IV heparin and BB  Syncope  --CT head no acute abnormality, cervical spine stable --Bulky calcified Rt carotid atherosclerosis would recommend carotid dopplers --some lightheadedness with getting up to [facility]   LVOT Gradient in 2019 mild --echo pending  HTN  --BP on arrival 142/119 this AM 128/70 --on avapro hct 300 25 and aldactone --her home amlodipine on hold for IV dilt.  --lopressor 25 BID new this admit  DM per IM  HLD on statin continue     For questions or updates, please contact [facility] Please consult [email or web address] for contact info under        Signed, Nada Boozer, NP  02/25/2023, 8:15 AM     Attending Note:   The patient was seen and examined.  Agree with assessment and plan as noted above.  Changes made to the above note as needed.  Patient seen and  independently examined with Nada Boozer, NP .   We discussed all aspects of the encounter. I agree with the assessment and plan as stated above.     Acute coronary syndrome.   Pt had a 2 day episode of chest burning ( mid sternal ) .   Associated with + troponins.  I have discussed the risks, benefits, options of heart cath with the patient .  She understands and agrees to proceed.   2  atrial fib:  on heparin.   Will need OAC upon DC    I have spent a total of 40 minutes with patient reviewing hospital  notes , telemetry, EKGs, labs and examining patient as well as establishing an assessment and plan that was discussed with the patient.  > 50% of time was spent in direct patient care.    Vesta Mixer, Montez Hageman., MD, [facility] 02/25/2023, 11:11 AM [address],  [address] Office [phone] Pager [phone]

## 2023-02-25 NOTE — Evaluation (Signed)
Physical Therapy Evaluation Patient Details Name: Rachel [name] MRN: [id number] DOB: [birth year] Today's Date: 02/25/2023  History of Present Illness  Pt is 82 year old presented to [facility] on  02/24/23 for syncopal episode and chest pain. Pt with new onset afib. Pt for heart cath 6/3.  PMH - HTN, OA, chf, asthma  Clinical Impression  Pt presents to PT after syncopal episode after work. Usually pt is independent, drives, and works managing breakfast buffet at [employer]. Today she is feeling nauseous and dizzy. Cardiology in during session and plans to do cardiac cath later today and asked [place] to defer ambulation. Pt able to stand and side step up bed with min guard. Expect pt's mobility will progress quickly once medically cleared. Will follow up tomorrow for more mobility.        Recommendations for follow up therapy are one component of a multi-disciplinary discharge planning process, led by the attending physician.  Recommendations may be updated based on patient status, additional functional criteria and insurance authorization.  Follow Up Recommendations       Assistance Recommended at Discharge PRN  Patient can return home with the following  Help with stairs or ramp for entrance;Assist for transportation    Equipment Recommendations Other (comment) (To be determined)  Recommendations for Other Services       Functional Status Assessment Patient has had a recent decline in their functional status and demonstrates the ability to make significant improvements in function in a reasonable and predictable amount of time.     Precautions / Restrictions Precautions Precautions: Other (comment) Precaution Comments: syncope      Mobility  Bed Mobility Overal bed mobility: Modified Independent Bed Mobility: Sit to Supine       Sit to supine: Modified independent (Device/Increase time)        Transfers Overall transfer level: Needs assistance Equipment used: 1  person hand held assist Transfers: Sit to/from Stand Sit to Stand: Min guard           General transfer comment: Assist for safety and lines    Ambulation/Gait             Pre-gait activities: Standing bedside with min guard. Side step up side of bed with min guard of hand held General Gait Details: Gait deferred per cardiology with concern for cardiac blockage and plans for heart cath.  Stairs            Wheelchair Mobility    Modified Rankin (Stroke Patients Only)       Balance Overall balance assessment: Mild deficits observed, not formally tested                                           Pertinent Vitals/Pain Pain Assessment Pain Assessment: No/denies pain    Home Living Family/patient expects to be discharged to:: Private residence Living Arrangements: Alone Available Help at Discharge: Family;Available PRN/intermittently Type of Home: House         Home Layout: Multi-level Home Equipment: None      Prior Function Prior Level of Function : Independent/Modified Independent;Working/employed;Driving             Mobility Comments: No assistive device       Hand Dominance   Dominant Hand: Left    Extremity/Trunk Assessment   Upper Extremity Assessment Upper Extremity Assessment: Overall WFL for tasks assessed  Lower Extremity Assessment Lower Extremity Assessment: Generalized weakness       Communication   Communication: No difficulties  Cognition Arousal/Alertness: Awake/alert Behavior During Therapy: WFL for tasks assessed/performed Overall Cognitive Status: Within Functional Limits for tasks assessed                                          General Comments General comments (skin integrity, edema, etc.): VSS on RA. Sitting BP 108/83. Standing BP 124/108 (? accuracy)    Exercises     Assessment/Plan    PT Assessment Patient needs continued PT services  PT Problem List Decreased  activity tolerance;Decreased strength;Decreased mobility;Cardiopulmonary status limiting activity       PT Treatment Interventions DME instruction;Gait training;Stair training;Functional mobility training;Therapeutic activities;Therapeutic exercise;Balance training;Patient/family education    PT Goals (Current goals can be found in the Care Plan section)  Acute Rehab PT Goals Patient Stated Goal: return home and to work PT Goal Formulation: With patient Time For Goal Achievement: 03/11/23 Potential to Achieve Goals: Good    Frequency Min 1X/week     Co-evaluation               AM-PAC PT "6 Clicks" Mobility  Outcome Measure Help needed turning from your back to your side while in a flat bed without using bedrails?: None Help needed moving from lying on your back to sitting on the side of a flat bed without using bedrails?: None Help needed moving to and from a bed to a chair (including a wheelchair)?: A Little Help needed standing up from a chair using your arms (e.g., wheelchair or bedside chair)?: A Little Help needed to walk in hospital room?: A Little (if had been allowed to amb) Help needed climbing 3-5 steps with a railing? : A Little 6 Click Score: 20    End of Session   Activity Tolerance: Other (comment) (pt with nausea as well as limited by cardiology request) Patient left: in bed;with call bell/phone within reach;with family/visitor present Nurse Communication: Mobility status PT Visit Diagnosis: Other abnormalities of gait and mobility (R26.89);Muscle weakness (generalized) (M62.81)    Time: 4098-1191 PT Time Calculation (min) (ACUTE ONLY): 22 min   Charges:   PT Evaluation $PT Eval Moderate Complexity: 1 Mod          [facility] PT Acute Rehabilitation Services Office [phone]   Angelina Ok [facility] 02/25/2023, 11:28 AM

## 2023-02-25 NOTE — Hospital Course (Addendum)
81 y.o.f w/ moderate asthma, HTN, chronic HFpEF, HLD, IIDM, came with palpitation, chest pain and syncope episode . She has a poorly controlled asthma,  and has severe seasonal allergy.  Last Wednesday she started to have runny nose and dry cough and wheezing and she went to see urgent care on Friday and was prescribed with prednisone.  Recently, her blood pressure has been poorly controlled she went to see her cardiologist about 1 month ago who increased her blood pressure medication- cardiology added combined pill of Diovan/HCTZ.  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 She fell forward and hit her right-sided rib cage but no head injury.  She estimated only LOC for a few seconds, no tongue biting or loss control of urine or bowel movement and no postictal confusion.She described the brief episode of chest pain this burning sensation nonradiating 3-4/10 and dissipated after strict return her consciousness.    ED Course: was in rapid A-fib, BP140/100.  CXR-negative for acute findings.  CT head and neck: negative for acute intracranial abnormalities or dislocation or fracture.Blood work showed K4.5, creatinine 1.2, glucose 220. Patient was admitted for New onset of A-fib and chest pain with troponin elevation probably demanding ischemia, cardiology was consulted placed on heparin drip and admitted. Underwent cardiac cath 6/3 no significant CAD. Echo showed EF 55 to 70%, no RWMA, normal right ventricular systolic function, no mitral stenosis aortic valve no aortic stenosis. overall patient is clinically improving on aspirin statin metoprolol Aldactone, Heparin drip for a fib>

## 2023-02-25 NOTE — Progress Notes (Addendum)
 Rounding Note    Patient Name: Rachel [name] Date of Encounter: 02/25/2023  [facility] Cardiologist: Tiffany , MD   Subjective   No more chest burning  has noted some dyspnea over last few months. Since BP meds adjusted she has not felt well after taking  She has never hat this mid sternal chest pain in the past  Described as a burning  Lasted for several days Has resolved now that she is on medical treatment   Inpatient Medications    Scheduled Meds:  aspirin EC  81 mg Oral Daily   atorvastatin  20 mg Oral Daily   budesonide (PULMICORT) nebulizer solution  0.25 mg Nebulization BID   irbesartan  300 mg Oral Daily   And   hydrochlorothiazide  25 mg Oral Daily   insulin aspart  0-15 Units Subcutaneous TID WC   loratadine  10 mg Oral Daily   metoprolol tartrate  25 mg Oral BID   repaglinide  4 mg Oral BID AC   spironolactone  25 mg Oral Daily   Continuous Infusions:  diltiazem (CARDIZEM) infusion 5 mg/hr (02/24/23 1236)   heparin 1,100 Units/hr (02/24/23 1545)   PRN Meds: acetaminophen, ipratropium, nitroGLYCERIN, ondansetron (ZOFRAN) IV   Vital Signs    Vitals:   02/25/23 0510 02/25/23 0511 02/25/23 0512 02/25/23 0746  BP: 125/63 (!) 130/48 (!) 120/53 128/70  Pulse:    81  Resp:      Temp:    97.9 F (36.6 C)  TempSrc:    Oral  SpO2:      Weight:      Height:        Intake/Output Summary (Last 24 hours) at 02/25/2023 0815 Last data filed at 02/25/2023 0413 Gross per 24 hour  Intake 237.47 ml  Output --  Net 237.47 ml      02/24/2023    9:01 AM 01/17/2023    8:15 AM 01/13/2023   12:26 AM  Last 3 Weights  Weight (lbs) 200 lb 200 lb 9.6 oz 190 lb  Weight (kg) 90.719 kg 90.992 kg 86.183 kg      Telemetry    Atrial fib rate controlled - Personally Reviewed  ECG    No new - Personally Reviewed  Physical Exam   GEN: No acute distress.   Neck: No JVD Cardiac: irreg irreg, no murmurs, rubs, or gallops.  Respiratory:  Clear to auscultation bilaterally. GI: Soft, nontender, non-distended  MS: No edema; No deformity. Neuro:  Nonfocal  Psych: Normal affect   Labs    High Sensitivity Troponin:   Recent Labs  Lab 02/24/23 0909 02/24/23 1236 02/24/23 1944  TROPONINIHS 127* 159* 217*     Chemistry Recent Labs  Lab 02/24/23 0909  NA 135  K 4.8  CL 106  CO2 20*  GLUCOSE 221*  BUN 32*  CREATININE 1.20*  CALCIUM 9.0  MG 1.9  GFRNONAA 46*  ANIONGAP 9    Lipids No results for input(s): "CHOL", "TRIG", "HDL", "LABVLDL", "LDLCALC", "CHOLHDL" in the last 168 hours.  Hematology Recent Labs  Lab 02/24/23 0909 02/25/23 0446  WBC 8.4 9.4  RBC 4.41 4.32  HGB 12.0 11.7*  HCT 39.0 37.4  MCV 88.4 86.6  MCH 27.2 27.1  MCHC 30.8 31.3  RDW 14.9 14.7  PLT 324 318   Thyroid  Recent Labs  Lab 02/24/23 1236  TSH 1.996    BNPNo results for input(s): "BNP", "PROBNP" in the last 168 hours.  DDimer  Recent Labs    Lab 02/24/23 1544  DDIMER 0.82*     Radiology    CT Cervical Spine Wo Contrast  Result Date: 02/24/2023 CLINICAL DATA:  82-year-old female with syncope, chest tightness, shortness of breath. Pain and nausea. EXAM: CT CERVICAL SPINE WITHOUT CONTRAST TECHNIQUE: Multidetector CT imaging of the cervical spine was performed without intravenous contrast. Multiplanar CT image reconstructions were also generated. RADIATION DOSE REDUCTION: This exam was performed according to the departmental dose-optimization program which includes automated exposure control, adjustment of the mA and/or kV according to patient size and/or use of iterative reconstruction technique. COMPARISON:  Head CT today. FINDINGS: Alignment: Normal cervical lordosis. Cervicothoracic junction alignment is within normal limits. Bilateral posterior element alignment is within normal limits. Skull base and vertebrae: Bone mineralization is within normal limits for age. Visualized skull base is intact. No atlanto-occipital  dissociation. C1 and C2 appear intact and aligned. No acute osseous abnormality identified. Soft tissues and spinal canal: No prevertebral fluid or swelling. No visible canal hematoma. Bulky calcified proximal right ICA atherosclerosis, otherwise negative visible noncontrast neck soft tissues. Disc levels: Upper cervical facet arthropathy but otherwise mild for age cervical spine degeneration. Upper chest: Calcified aortic atherosclerosis. Mild respiratory motion. Visible upper thoracic levels appear grossly intact. IMPRESSION: 1. No acute traumatic injury identified in the cervical spine. 2. Mild for age cervical spine degeneration aside from upper cervical facet arthropathy. 3. Bulky calcified right carotid atherosclerosis. Electronically Signed   By: H  Hall M.D.   On: 02/24/2023 10:44   CT Head Wo Contrast  Result Date: 02/24/2023 CLINICAL DATA:  82-year-old female with syncope, chest tightness, shortness of breath. Pain and nausea. EXAM: CT HEAD WITHOUT CONTRAST TECHNIQUE: Contiguous axial images were obtained from the base of the skull through the vertex without intravenous contrast. RADIATION DOSE REDUCTION: This exam was performed according to the departmental dose-optimization program which includes automated exposure control, adjustment of the mA and/or kV according to patient size and/or use of iterative reconstruction technique. COMPARISON:  None Available. FINDINGS: Brain: Occasional small areas of dural calcification (normal variant more pronounced in the right hemisphere. Cerebral volume is within normal limits for age. No midline shift, ventriculomegaly, mass effect, evidence of mass lesion, intracranial hemorrhage or evidence of cortically based acute infarction. Gray-white matter differentiation mostly normal for age. There is minimal to mild left frontal lobe white matter hypodensity. No cortical encephalomalacia. Vascular: Calcified atherosclerosis at the skull base. No suspicious intracranial  vascular hyperdensity. Skull: No acute osseous abnormality identified. Sinuses/Orbits: Visualized paranasal sinuses and mastoids are clear. Other: No orbit or scalp soft tissue injury identified. IMPRESSION: 1. No acute intracranial abnormality or acute traumatic injury identified. 2. Largely normal for age; minimal cerebral white matter changes, calcified atherosclerosis. Electronically Signed   By: H  Hall M.D.   On: 02/24/2023 10:42   DG Chest Portable 1 View  Result Date: 02/24/2023 CLINICAL DATA:  Chest pain.  Syncopal episode. EXAM: PORTABLE CHEST 1 VIEW COMPARISON:  01/13/2023. FINDINGS: Cardiac silhouette is mildly enlarged. No mediastinal or hilar masses. Clear lungs.  No pleural effusion or pneumothorax. Skeletal structures are grossly intact. IMPRESSION: No active disease. Electronically Signed   By: David  Ormond M.D.   On: 02/24/2023 09:55    Cardiac Studies  Echo 01/09/2018: Study Conclusions  - Left ventricle: Wall thickness was increased in a pattern of    moderate LVH. Systolic function was vigorous. The estimated    ejection fraction was in the range of 65% to 70%. Wall motion was      normal; there were no regional wall motion abnormalities. Doppler    parameters are consistent with abnormal left ventricular    relaxation (grade 1 diastolic dysfunction).  - Aortic valve: Peak gradient 18 mmHg across the aortic valve    likely represents LV outflow tract gradient rather than valvular    AS, the valve appears to open well. Peak gradient (S): 18 mm Hg.  - Mitral valve: There was mild regurgitation.  - Right ventricle: The cavity size was normal. Systolic function    was normal.  - Tricuspid valve: Peak RV-RA gradient (S): 37 mm Hg.  - Pulmonary arteries: PA peak pressure: 40 mm Hg (S).  - Inferior vena cava: The vessel was normal in size. The    respirophasic diameter changes were in the normal range (>= 50%),    consistent with normal central venous pressure.   Impressions:   - Normal LV size with moderate LV hypertrophy. EF 65-70%, vigorous    systolic function. Normal RV size and systolic function. Mild    gradient across the LV outflow tract is likely due to    LVH/vigorous LV systolic function rather than valvular AS.      Patient Profile     81 y.o. female with hx of HTN, Asthma, DM, HFpEF followed in advanced HTN clinic admitted 02/24/23 with epigastric burning, syncope after standing and new atrial fib.  Assessment & Plan    Atrial fib new onset  --Cha2DS2VASc of 6 --IV heparin  will need AC oral once tests decided (no hx of GI Bleed, no hx colonoscopy) --IV dilt drip at 5 rate controlled continue until echo back, then could switch to po --Echo in past with LVOT gradient  ? Cause of syncope Echo pending this admit --pt is NPO --she was on pseudoephedrine at home, discussed not taking now with hx of atrial fib. --consider sleep study as outpt    Epigastric pain and elevated tropinin  --127>>159>>217 --could be from a fib vs a fib from ischemia --? Cardiac CTA vs cath --her mother a heavy smoker had CABG in her late 50s early 60s --on po ASA and IV heparin and BB  Syncope  --CT head no acute abnormality, cervical spine stable --Bulky calcified Rt carotid atherosclerosis would recommend carotid dopplers --some lightheadedness with getting up to BSC   LVOT Gradient in 2019 mild --echo pending  HTN  --BP on arrival 142/119 this AM 128/70 --on avapro hct 300 25 and aldactone --her home amlodipine on hold for IV dilt.  --lopressor 25 BID new this admit  DM per IM  HLD on statin continue     For questions or updates, please contact [facility] Please consult [email or web address] for contact info under        Signed, Laura Ingold, NP  02/25/2023, 8:15 AM     Attending Note:   The patient was seen and examined.  Agree with assessment and plan as noted above.  Changes made to the above note as needed.  Patient seen and  independently examined with Laura Ingold, NP .   We discussed all aspects of the encounter. I agree with the assessment and plan as stated above.     Acute coronary syndrome.   Pt had a 2 day episode of chest burning ( mid sternal ) .   Associated with + troponins.  I have discussed the risks, benefits, options of heart cath with the patient .  She understands and agrees to proceed.   2     atrial fib:  on heparin.   Will need OAC upon DC    I have spent a total of 40 minutes with patient reviewing hospital  notes , telemetry, EKGs, labs and examining patient as well as establishing an assessment and plan that was discussed with the patient.  > 50% of time was spent in direct patient care.    Sarthak Rubenstein J. Armina Galloway, Jr., MD, FACC 02/25/2023, 11:11 AM [address],  [address] Office - [phone] Pager [phone]    

## 2023-02-25 NOTE — Interval H&P Note (Signed)
History and Physical Interval Note:  02/25/2023 1:42 PM  Rachel [name]  has presented today for surgery, with the diagnosis of nstemi.  The various methods of treatment have been discussed with the patient and family. After consideration of risks, benefits and other options for treatment, the patient has consented to  Procedure(s): LEFT HEART CATH AND CORONARY ANGIOGRAPHY (N/A) as a surgical intervention.  The patient's history has been reviewed, patient examined, no change in status, stable for surgery.  I have reviewed the patient's chart and labs.  Questions were answered to the patient's satisfaction.    Cath Lab Visit (complete for each Cath Lab visit)  Clinical Evaluation Leading to the Procedure:   ACS: Yes.    Non-ACS:    Anginal Classification: CCS III  Anti-ischemic medical therapy: Minimal Therapy (1 class of medications)  Non-Invasive Test Results: No non-invasive testing performed  Prior CABG: No previous CABG        Rachel [name]

## 2023-02-25 NOTE — Telephone Encounter (Addendum)
2nd call attempt to patient, no answer, no VM.    ----- Message from Chilton Si, MD sent at 02/20/2023  8:22 AM EDT ----- Potassium is mildly elevated and kidney function is mildly abnormal.  Reduce spironolactone to 12.5 mg.  Repeat BMP in 1 week.

## 2023-02-25 NOTE — Progress Notes (Signed)
Echocardiogram 2D Echocardiogram has been performed.  Rachel [name] 02/25/2023, 10:35 AM

## 2023-02-25 NOTE — Progress Notes (Signed)
Pt OOB to BR with NT and reported being "a little dizzy". Monitor leads had been pulled loose(not sticking) while she was OOB, so unable to see what HR/rhythm was at that time. Leads replaced and pt was AFIB w/HR 93. . After getting back in bed, pt states she felt better and thought she had ear trouble. We checked orthostatic VS:  Lying 125/63 HR 82, sitting 130/48 HR 84, and standing 120/53 HR 65. Pt unable to stand for 3 min and requested to lay back down.

## 2023-02-25 NOTE — Progress Notes (Deleted)
Patient ID: Rachel [name], female    DOB: 1940/11/03  MRN: [id number]  CC: Chronic Care Management  Subjective: Rachel [name] is a 82 y.o. female who presents for chronic care management.  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   Bacterial pharyngitis 08/25/2014   Acute bronchitis 07/03/2014   Oral thrush 07/03/2014   Onychomycosis 02/18/2013   URI (upper respiratory infection) 07/11/2012   Cough 07/11/2012   Ganglion cyst 06/25/2011   Foot pain, left 06/25/2011   Otitis media 06/25/2011   LIPOMA 11/03/2010   EUSTACHIAN TUBE DYSFUNCTION, RIGHT 02/18/2010   Asthma with acute exacerbation 11/02/2009   ONYCHOMYCOSIS, TOENAILS 05/18/2009   Hyperlipidemia 09/15/2008   DM2 (diabetes mellitus, type 2) (HCC) 04/19/2007   Essential hypertension 04/19/2007   Allergic rhinitis 04/19/2007   OSTEOARTHRITIS 04/19/2007     Current Facility-Administered Medications on File Prior to Visit  Medication Dose Route Frequency Provider Last Rate Last Admin   0.9 %  sodium chloride infusion  250 mL Intravenous PRN Leone Brand, NP       0.9 %  sodium chloride infusion   Intravenous Continuous Leone Brand, NP 50 mL/hr at 02/25/23 1215 New Bag at 02/25/23 1215   acetaminophen (TYLENOL) tablet 1,000 mg  1,000 mg Oral TID PRN Emeline General, MD       [START ON 02/26/2023] aspirin chewable tablet 81 mg  81 mg Oral Kristopher Oppenheim, NP       aspirin EC tablet 81 mg  81 mg Oral Daily Mikey College T, MD   81 mg at 02/25/23 0904   atorvastatin (LIPITOR) tablet 20 mg  20 mg Oral Daily Mikey College T, MD   20 mg at 02/25/23 0904   budesonide (PULMICORT) nebulizer solution 0.25 mg  0.25 mg Nebulization BID Mikey College T, MD   0.25 mg at 02/25/23 0847   diltiazem (CARDIZEM) 125 mg in dextrose 5% 125 mL (1 mg/mL) infusion  5-15 mg/hr Intravenous Continuous Linwood Dibbles, MD 5 mL/hr at 02/24/23 1236 5 mg/hr at 02/24/23 1236   heparin ADULT infusion 100 units/mL (25000 units/217mL)  1,100 Units/hr Intravenous Continuous Mikey College T, MD 11 mL/hr at 02/24/23 1545 1,100 Units/hr at 02/24/23 1545   irbesartan (AVAPRO) tablet 300 mg  300 mg Oral Daily Mikey College T, MD   300 mg at 02/25/23 7829   And   hydrochlorothiazide (HYDRODIURIL) tablet 25 mg  25 mg Oral Daily Mikey College T, MD   25 mg at 02/25/23 0904   insulin aspart (novoLOG) injection 0-15 Units  0-15 Units Subcutaneous TID WC Mikey College T, MD   3 Units at 02/25/23 1248   ipratropium (ATROVENT) nebulizer solution 0.5 mg  0.5 mg Nebulization Q4H PRN Mikey College T, MD       loratadine (CLARITIN) tablet 10 mg  10 mg Oral Daily Mikey College T, MD   10 mg at 02/25/23 0905   metoprolol tartrate (LOPRESSOR) tablet 25 mg  25  mg Oral BID Mikey College T, MD   25 mg at 02/25/23 0904   nitroGLYCERIN (NITROSTAT) SL tablet 0.4 mg  0.4 mg Sublingual Q5 Min x 3 PRN Emeline General, MD       ondansetron [facility]) injection 4 mg  4 mg Intravenous Q6H PRN Mikey College T, MD       sodium chloride flush (NS) 0.9 % injection 3 mL  3 mL Intravenous Q12H Leone Brand, NP       sodium chloride flush (NS) 0.9 % injection 3 mL  3 mL Intravenous PRN Leone Brand, NP       spironolactone (ALDACTONE) tablet 25 mg  25 mg Oral Daily Mikey College T, MD   25 mg at 02/25/23 4098   Current Outpatient Medications on File Prior to Visit  Medication Sig Dispense Refill   acetaminophen (TYLENOL) 500 MG tablet Take 500 mg by mouth every 8 (eight) hours as needed for mild pain or  headache.     albuterol (VENTOLIN HFA) 108 (90 Base) MCG/ACT inhaler INHALE 1-2 PUFFS BY MOUTH EVERY 6 HOURS AS NEEDED FOR WHEEZE OR SHORTNESS OF BREATH (Patient taking differently: Inhale 1-2 puffs into the lungs every 6 (six) hours as needed for wheezing or shortness of breath.) 18 each 1   amLODipine (NORVASC) 2.5 MG tablet Take 1 tablet (2.5 mg total) by mouth daily. 30 tablet 2   atorvastatin (LIPITOR) 20 MG tablet Take 1 tablet (20 mg total) by mouth daily. (Patient not taking: Reported on 02/24/2023) 90 tablet 3   Blood Glucose Monitoring Suppl (ONETOUCH VERIO REFLECT) w/Device KIT 1 each by Does not apply route daily. E11.9     budesonide (PULMICORT) 0.25 MG/2ML nebulizer solution Take 2 mLs (0.25 mg total) by nebulization 2 (two) times daily. 60 mL 2   cetirizine (ZYRTEC) 5 MG tablet Take 1 tablet (5 mg total) by mouth daily. (Patient not taking: Reported on 02/24/2023) 90 tablet 0   diclofenac Sodium (VOLTAREN) 1 % GEL Apply 2 g topically 4 (four) times daily as needed. (Patient not taking: Reported on 02/24/2023) 50 g 1   fluticasone (FLONASE) 50 MCG/ACT nasal spray Place 2 sprays into both nostrils daily as needed for allergies or rhinitis.     furosemide (LASIX) 20 MG tablet Take 1 tablet (20 mg total) by mouth 2 (two) times daily for 5 days. (Patient not taking: Reported on 02/24/2023) 10 tablet 0   Glucose Blood (ONETOUCH VERIO VI) 1 each by Other route daily. E11.9     glucose blood (ONETOUCH VERIO) test strip Use to check blood sugars once a day 100 strip 5   ibuprofen (ADVIL) 800 MG tablet Take 1 tablet (800 mg total) by mouth every 8 (eight) hours as needed. (Patient not taking: Reported on 02/24/2023) 30 tablet 1   ipratropium (ATROVENT HFA) 17 MCG/ACT inhaler Inhale 1 puff into the lungs 3 (three) times daily. 1 each 2   loratadine (CLARITIN) 10 MG tablet Take 1 tablet (10 mg total) by mouth daily as needed for allergies. (Patient taking differently: Take 10 mg by mouth in the morning.) 30  tablet 0   Multiple Vitamins-Minerals (HAIR SKIN AND NAILS FORMULA) TABS Take 1 tablet by mouth daily.     nystatin (MYCOSTATIN/NYSTOP) powder Apply topically 4 (four) times daily. (Patient taking differently: Apply topically 4 (four) times daily as needed (for rashes).) 60 g 0   nystatin cream (MYCOSTATIN) Apply 1 application topically 2 (two) times daily. (Patient taking differently: Apply  1 application  topically 2 (two) times daily as needed for dry skin (or rashes).) 30 g 1   OneTouch Delica Lancets 30G MISC E11.9 100 each 3   pseudoephedrine (SUDAFED) 30 MG tablet Take 1 tablet (30 mg total) by mouth every 8 (eight) hours as needed for congestion. (Patient not taking: Reported on 02/24/2023) 30 tablet 0   spironolactone (ALDACTONE) 25 MG tablet Take 1 tablet (25 mg total) by mouth daily. 30 tablet 2   valsartan-hydrochlorothiazide (DIOVAN-HCT) 320-25 MG tablet Take 1 tablet by mouth daily. 90 tablet 3    Allergies  Allergen Reactions   Aspirin Nausea And Vomiting and Other (See Comments)    Uncoated version = "Jittery and GI upset" (has tolerated the chewable version)   Atorvastatin Other (See Comments)    Caused aching in the body   Codeine Sulfate Nausea And Vomiting and Other (See Comments)    Heart races also   Glimepiride Diarrhea   Metformin Diarrhea   Prandin [Repaglinide] Other (See Comments)    "it upset my stomach"   Propoxyphene N-Acetaminophen Nausea And Vomiting    Social History   Socioeconomic History   Marital status: Widowed    Spouse name: Not on file   Number of children: Not on file   Years of education: Not on file   Highest education level: Not on file  Occupational History   Occupation: department mang    Employer: [employer]    Comment: Retired  Tobacco Use   Smoking status: Never    Passive exposure: Never   Smokeless tobacco: Never  Vaping Use   Vaping Use: Never used  Substance and Sexual Activity   Alcohol use: Yes    Alcohol/week:  1.0 standard drink of alcohol    Types: 1 Glasses of wine per week    Comment: ocassionally   Drug use: No   Sexual activity: Not Currently  Other Topics Concern   Not on file  Social History Narrative   No regular exercise   Widowed   Social Determinants of Health   Financial Resource Strain: Low Risk  (11/01/2021)   Overall Financial Resource Strain (CARDIA)    Difficulty of Paying Living Expenses: Not hard at all  Food Insecurity: No Food Insecurity (02/24/2023)   Hunger Vital Sign    Worried About Running Out of Food in the Last Year: Never true    Ran Out of Food in the Last Year: Never true  Transportation Needs: No Transportation Needs (02/24/2023)   PRAPARE - Administrator, Civil Service (Medical): No    Lack of Transportation (Non-Medical): No  Physical Activity: Inactive (11/01/2021)   Exercise Vital Sign    Days of Exercise per Week: 0 days    Minutes of Exercise per Session: 0 min  Stress: Not on file  Social Connections: Not on file  Intimate Partner Violence: Not At Risk (02/24/2023)   Humiliation, Afraid, Rape, and Kick questionnaire    Fear of Current or Ex-Partner: No    Emotionally Abused: No    Physically Abused: No    Sexually Abused: No    Family History  Problem Relation Age of Onset   Diabetes Mother    Heart attack Mother    Cancer Father        brain   Diabetes Sister    Hypertension Sister    Diabetes Brother    Hypertension Brother    Coronary artery disease Brother    Aneurysm Other  niece    Past Surgical History:  Procedure Laterality Date   ACNE CYST REMOVAL  1970   back   APPENDECTOMY  1963   OOPHORECTOMY  1985   VAGINAL HYSTERECTOMY  1985   complete    ROS: Review of Systems Negative except as stated above  PHYSICAL EXAM: There were no vitals taken for this visit.  Physical Exam  {female adult master:[id number]} {female adult master:[id number]}     Latest Ref Rng & Units 02/24/2023    9:09 AM 01/24/2023   11:17  AM 01/13/2023   12:32 AM  CMP  Glucose 70 - 99 mg/dL 161  096  045   BUN 8 - 23 mg/dL 32  28  23   Creatinine 0.44 - 1.00 mg/dL 4.09  8.11  9.14   Sodium 135 - 145 mmol/L 135  142  137   Potassium 3.5 - 5.1 mmol/L 4.8  5.5  4.5   Chloride 98 - 111 mmol/L 106  104  104   CO2 22 - 32 mmol/L 20  20  23    Calcium 8.9 - 10.3 mg/dL 9.0  78.2  9.7    Lipid Panel     Component Value Date/Time   CHOL 223 (H) 09/10/2022 1554   TRIG 196 (H) 09/10/2022 1554   HDL 42 09/10/2022 1554   CHOLHDL 5.3 (H) 09/10/2022 1554   CHOLHDL 5 01/12/2020 1350   VLDL 45.2 (H) 01/12/2020 1350   LDLCALC 145 (H) 09/10/2022 1554   LDLDIRECT 139.0 01/12/2020 1350    CBC    Component Value Date/Time   WBC 9.4 02/25/2023 0446   RBC 4.32 02/25/2023 0446   HGB 11.7 (L) 02/25/2023 0446   HGB 12.0 09/10/2022 1554   HCT 37.4 02/25/2023 0446   HCT 37.4 09/10/2022 1554   PLT 318 02/25/2023 0446   PLT 370 09/10/2022 1554   MCV 86.6 02/25/2023 0446   MCV 85 09/10/2022 1554   MCH 27.1 02/25/2023 0446   MCHC 31.3 02/25/2023 0446   RDW 14.7 02/25/2023 0446   RDW 14.7 09/10/2022 1554   LYMPHSABS 3.4 01/13/2023 0032   MONOABS 0.7 01/13/2023 0032   EOSABS 0.2 01/13/2023 0032   BASOSABS 0.1 01/13/2023 0032    ASSESSMENT AND PLAN:  There are no diagnoses linked to this encounter.   Patient was given the opportunity to ask questions.  Patient verbalized understanding of the plan and was able to repeat key elements of the plan. Patient was given clear instructions to go to Emergency Department or return to medical center if symptoms don't improve, worsen, or new problems develop.The patient verbalized understanding.   No orders of the defined types were placed in this encounter.    Requested Prescriptions    No prescriptions requested or ordered in this encounter    No follow-ups on file.  Rema Fendt, NP

## 2023-02-25 NOTE — Progress Notes (Signed)
PROGRESS NOTE Rachel [name]  ZHY:[id number] DOB: September 14, 1941 DOA: 02/24/2023 PCP: Rema Fendt, NP  Brief Narrative/Hospital Course: 82 y.o.f w/ moderate asthma, HTN, chronic HFpEF, HLD, IIDM, came with palpitation, chest pain and syncope episode . She has a poorly controlled asthma,  and has severe seasonal allergy.  Last Wednesday she started to have runny nose and dry cough and wheezing and she went to see urgent care on Friday and was prescribed with prednisone.  Recently, her blood pressure has been poorly controlled she went to see her cardiologist about 1 month ago who increased her blood pressure medication- cardiology added combined pill of Diovan/HCTZ.  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 She fell forward and hit her right-sided rib cage but no head injury.  She estimated only LOC for a few seconds, no tongue biting or loss control of urine or bowel movement and no postictal confusion.She described the brief episode of chest pain this burning sensation nonradiating 3-4/10 and dissipated after strict return her consciousness.    ED Course: was in rapid A-fib, BP140/100.  CXR-negative for acute findings.  CT head and neck: negative for acute intracranial abnormalities or dislocation or fracture.Blood work showed K4.5, creatinine 1.2, glucose 220. Patient was admitted for New onset of A-fib and chest pain with troponin elevation probably demanding ischemia, cardiology was consulted placed on heparin drip and admitted.    Subjective: Seen and examined Overnight afebrile BP stable, not hypoxic. Labs are stable, has hyperglycemia in 240s. Patient's daughter  is at the bedside, she is getting echo done [place] feels better Earlier she had some chest pressure and wheezing and got better with bronchodilators  Assessment and Plan: Principal  Problem:   Afib (HCC) Active Problems:   Acute bronchitis   A-fib (HCC)   Syncope, vasovagal   New onset A-fib with RVR new diagnosis placed on heparin drip due to high CHA2DS2-VASc score of 6, started on metoprolol, follow-up echocardiogram and further recommendation from cardiology  Elevated troponin and epigastric pain unclear etiology GI versus demand ischemia. Cardiology planning for CT coronary if heart rate can be controlled otherwise may need left heart cardiac-pending echocardiogram Recent Labs    02/24/23 0909 02/24/23 1236 02/24/23 1944  TROPONINIHS 127* 159* 217*    Episode of syncope: LVOT gradient on prior echo: could be contributing to syncope??  Follow-up echo. Monitor orthostatic vitals.  PT OT eval.  Monitoring telemetry.  Moderate asthma:, Continue Pulmicort and bronchodilator, not needing oxygen.  Hypertension Chronic HFpEF: BP stable. volume status stable continue Lopressor.,  Avapro, Aldactone and HCTZ.  DM: Blood sugar poorly controlled in 200, recently no longer taking Prandin due to stomach upset. Continue SSI, check HbA1c Recent Labs  Lab 02/24/23 1123 02/24/23 1617 02/24/23 2100 02/25/23 0521 02/25/23 0814  GLUCAP 214* 164* 180* 221* 243*    Class I Obesity:Patient's Body mass index is 35.43 kg/m. : Will benefit with PCP follow-up, weight loss  healthy lifestyle and outpatient sleep evaluation.  DVT prophylaxis: heparin Code Status:   Code Status: Full Code Family Communication: plan of care discussed with patient at bedside. Patient status is:  admitted as observation but remains hospitalized for ongoing  because of  A FIB Level of care: Progressive   Dispo: The patient is from: HOME ALONE            Anticipated disposition: tbd in Objective: Vitals  last 24 hrs: Vitals:   02/25/23 0510 02/25/23 0511 02/25/23 0512 02/25/23 0746  BP: 125/63 (!) 130/48 (!) 120/53 128/70  Pulse:    81  Resp:      Temp:    97.9 F (36.6 C)  TempSrc:     Oral  SpO2:      Weight:      Height:       Weight change:   Physical Examination: General exam: alert awake, older than stated age HEENT:Oral mucosa moist, Ear/Nose WNL grossly Respiratory system: bilaterally mild wheezing BS, no use of accessory muscle Cardiovascular system: S1 & S2 +, No JVD. Gastrointestinal system: Abdomen soft,NT,ND, BS+ Nervous System:Alert, awake, moving extremities. Extremities: LE edema neg,distal peripheral pulses palpable.  Skin: No rashes,no icterus. MSK: Normal muscle bulk,tone, power  Medications reviewed:  Scheduled Meds:  aspirin EC  81 mg Oral Daily   atorvastatin  20 mg Oral Daily   budesonide (PULMICORT) nebulizer solution  0.25 mg Nebulization BID   irbesartan  300 mg Oral Daily   And   hydrochlorothiazide  25 mg Oral Daily   insulin aspart  0-15 Units Subcutaneous TID WC   loratadine  10 mg Oral Daily   metoprolol tartrate  25 mg Oral BID   repaglinide  4 mg Oral BID AC   spironolactone  25 mg Oral Daily   Continuous Infusions:  diltiazem (CARDIZEM) infusion 5 mg/hr (02/24/23 1236)   heparin 1,100 Units/hr (02/24/23 1545)    Diet Order             Diet heart healthy/carb modified Room service appropriate? Yes; Fluid consistency: Thin  Diet effective now                  Intake/Output Summary (Last 24 hours) at 02/25/2023 0835 Last data filed at 02/25/2023 0413 Gross per 24 hour  Intake 237.47 ml  Output --  Net 237.47 ml   Net IO Since Admission: 237.47 mL [02/25/23 0835]  Wt Readings from Last 3 Encounters:  02/24/23 90.7 kg  01/17/23 91 kg  01/13/23 86.2 kg     Unresulted Labs (From admission, onward)     Start     Ordered   02/25/23 0500  Lipoprotein A (LPA)  Tomorrow morning,   R        02/24/23 1336   02/25/23 0500  Heparin level (unfractionated)  Daily,   R      02/24/23 1355   02/25/23 0500  CBC  Daily,   R      02/24/23 2239          Data Reviewed: I have personally reviewed following labs and  imaging studies CBC: Recent Labs  Lab 02/24/23 0909 02/25/23 0446  WBC 8.4 9.4  HGB 12.0 11.7*  HCT 39.0 37.4  MCV 88.4 86.6  PLT 324 318   Basic Metabolic Panel: Recent Labs  Lab 02/24/23 0909  NA 135  K 4.8  CL 106  CO2 20*  GLUCOSE 221*  BUN 32*  CREATININE 1.20*  CALCIUM 9.0  MG 1.9  CBG: Recent Labs  Lab 02/24/23 1123 02/24/23 1617 02/24/23 2100 02/25/23 0521 02/25/23 0814  GLUCAP 214* 164* 180* 221* 243*   Recent Labs    02/24/23 1236  TSH 1.996  No results found for this or any previous visit (from the past 240 hour(s)).  Antimicrobials: Anti-infectives (From admission, onward)    None      Culture/Microbiology No results found for: "SDES", "SPECREQUEST", "CULT", "REPTSTATUS"  Radiology Studies: CT Cervical Spine Wo Contrast  Result Date: 02/24/2023 CLINICAL DATA:  82 year old female with syncope, chest tightness, shortness of breath. Pain and nausea. EXAM: CT CERVICAL SPINE WITHOUT CONTRAST TECHNIQUE: Multidetector CT imaging of the cervical spine was performed without intravenous contrast. Multiplanar CT image reconstructions were also generated. RADIATION DOSE REDUCTION: This exam was performed according to the departmental dose-optimization program which includes automated exposure control, adjustment of the mA and/or kV according to patient size and/or use of iterative reconstruction technique. COMPARISON:  Head CT today. FINDINGS: Alignment: Normal cervical lordosis. Cervicothoracic junction alignment is within normal limits. Bilateral posterior element alignment is within normal limits. Skull base and vertebrae: Bone mineralization is within normal limits for age. Visualized skull base is intact. No atlanto-occipital dissociation. C1 and C2 appear intact and aligned. No acute osseous abnormality identified. Soft tissues and spinal canal: No prevertebral fluid or swelling. No visible canal hematoma. Bulky calcified proximal right ICA atherosclerosis,  otherwise negative visible noncontrast neck soft tissues. Disc levels: Upper cervical facet arthropathy but otherwise mild for age cervical spine degeneration. Upper chest: Calcified aortic atherosclerosis. Mild respiratory motion. Visible upper thoracic levels appear grossly intact. IMPRESSION: 1. No acute traumatic injury identified in the cervical spine. 2. Mild for age cervical spine degeneration aside from upper cervical facet arthropathy. 3. Bulky calcified right carotid atherosclerosis. Electronically Signed   By: Odessa Fleming M.D.   On: 02/24/2023 10:44   CT Head Wo Contrast  Result Date: 02/24/2023 CLINICAL DATA:  82 year old female with syncope, chest tightness, shortness of breath. Pain and nausea. EXAM: CT HEAD WITHOUT CONTRAST TECHNIQUE: Contiguous axial images were obtained from the base of the skull through the vertex without intravenous contrast. RADIATION DOSE REDUCTION: This exam was performed according to the departmental dose-optimization program which includes automated exposure control, adjustment of the mA and/or kV according to patient size and/or use of iterative reconstruction technique. COMPARISON:  None Available. FINDINGS: Brain: Occasional small areas of dural calcification (normal variant more pronounced in the right hemisphere. Cerebral volume is within normal limits for age. No midline shift, ventriculomegaly, mass effect, evidence of mass lesion, intracranial hemorrhage or evidence of cortically based acute infarction. Gray-white matter differentiation mostly normal for age. There is minimal to mild left frontal lobe white matter hypodensity. No cortical encephalomalacia. Vascular: Calcified atherosclerosis at the skull base. No suspicious intracranial vascular hyperdensity. Skull: No acute osseous abnormality identified. Sinuses/Orbits: Visualized paranasal sinuses and mastoids are clear. Other: No orbit or scalp soft tissue injury identified. IMPRESSION: 1. No acute intracranial  abnormality or acute traumatic injury identified. 2. Largely normal for age; minimal cerebral white matter changes, calcified atherosclerosis. Electronically Signed   By: Odessa Fleming M.D.   On: 02/24/2023 10:42   DG Chest Portable 1 View  Result Date: 02/24/2023 CLINICAL DATA:  Chest pain.  Syncopal episode. EXAM: PORTABLE CHEST 1 VIEW COMPARISON:  01/13/2023. FINDINGS: Cardiac silhouette is mildly enlarged. No mediastinal or hilar masses. Clear lungs.  No pleural effusion or pneumothorax. Skeletal structures are grossly intact. IMPRESSION: No active disease. Electronically Signed   By: Amie Portland M.D.   On: 02/24/2023 09:55     LOS: 0 days   Lanae Boast, MD [facility]  02/25/2023, 8:35 AM

## 2023-02-25 NOTE — Progress Notes (Signed)
ANTICOAGULATION CONSULT NOTE  Pharmacy Consult for Heparin Indication: atrial fibrillation  Allergies  Allergen Reactions   Aspirin Nausea And Vomiting and Other (See Comments)    Uncoated version = "Jittery and GI upset" (has tolerated the chewable version)   Atorvastatin Other (See Comments)    Caused aching in the body   Codeine Sulfate Nausea And Vomiting and Other (See Comments)    Heart races also   Glimepiride Diarrhea   Metformin Diarrhea   Prandin [Repaglinide] Other (See Comments)    "it upset my stomach"   Propoxyphene N-Acetaminophen Nausea And Vomiting    Patient Measurements: Height: 5\' 3"  (160 cm) Weight: 91 kg (200 lb 9.9 oz) IBW/kg (Calculated) : 52.4 Heparin Dosing Weight: 73.1 kg  Vital Signs: Temp: 97.9 F (36.6 C) (06/03 1254) Temp Source: Oral (06/03 1254) BP: 135/70 (06/03 1406) Pulse Rate: 63 (06/03 1406)  Labs: Recent Labs    02/24/23 0909 02/24/23 1236 02/24/23 1944 02/24/23 2140 02/25/23 0446  HGB 12.0  --   --   --  11.7*  HCT 39.0  --   --   --  37.4  PLT 324  --   --   --  318  HEPARINUNFRC  --   --   --  0.66 0.66  CREATININE 1.20*  --   --   --   --   TROPONINIHS 127* 159* 217*  --   --      Estimated Creatinine Clearance: 40.7 mL/min (A) (by C-G formula based on SCr of 1.2 mg/dL (H)).   Medical History: Past Medical History:  Diagnosis Date   (HFpEF) heart failure with preserved ejection fraction (HCC) 01/17/2023   Allergic rhinitis    Diabetes mellitus, type 2 (HCC)    Hyperlipemia    Hypertension    Left shoulder pain 11/01/2021   Lipoma NEC    anterior upper chest   Osteoarthritis of knee    Shortness of breath 11/01/2021    Medications:  Medications Prior to Admission  Medication Sig Dispense Refill Last Dose   acetaminophen (TYLENOL) 500 MG tablet Take 500 mg by mouth every 8 (eight) hours as needed for mild pain or headache.   unk   albuterol (VENTOLIN HFA) 108 (90 Base) MCG/ACT inhaler INHALE 1-2 PUFFS BY  MOUTH EVERY 6 HOURS AS NEEDED FOR WHEEZE OR SHORTNESS OF BREATH (Patient taking differently: Inhale 1-2 puffs into the lungs every 6 (six) hours as needed for wheezing or shortness of breath.) 18 each 1 02/22/2023   amLODipine (NORVASC) 2.5 MG tablet Take 1 tablet (2.5 mg total) by mouth daily. 30 tablet 2 02/24/2023 at am   fluticasone (FLONASE) 50 MCG/ACT nasal spray Place 2 sprays into both nostrils daily as needed for allergies or rhinitis.   unk   loratadine (CLARITIN) 10 MG tablet Take 1 tablet (10 mg total) by mouth daily as needed for allergies. (Patient taking differently: Take 10 mg by mouth in the morning.) 30 tablet 0 02/23/2023 at am   Multiple Vitamins-Minerals (HAIR SKIN AND NAILS FORMULA) TABS Take 1 tablet by mouth daily.   Past Week   nystatin (MYCOSTATIN/NYSTOP) powder Apply topically 4 (four) times daily. (Patient taking differently: Apply topically 4 (four) times daily as needed (for rashes).) 60 g 0 unk   nystatin cream (MYCOSTATIN) Apply 1 application topically 2 (two) times daily. (Patient taking differently: Apply 1 application  topically 2 (two) times daily as needed for dry skin (or rashes).) 30 g 1 unk   [EXPIRED]  predniSONE (DELTASONE) 20 MG tablet Take 2 tablets (40 mg total) by mouth daily with breakfast for 3 days. 6 tablet 0 02/22/2023 at SEE NOTE   spironolactone (ALDACTONE) 25 MG tablet Take 1 tablet (25 mg total) by mouth daily. 30 tablet 2 02/24/2023 at am   valsartan-hydrochlorothiazide (DIOVAN-HCT) 320-25 MG tablet Take 1 tablet by mouth daily. 90 tablet 3 02/24/2023 at am   atorvastatin (LIPITOR) 20 MG tablet Take 1 tablet (20 mg total) by mouth daily. (Patient not taking: Reported on 02/24/2023) 90 tablet 3 Not Taking   Blood Glucose Monitoring Suppl (ONETOUCH VERIO REFLECT) w/Device KIT 1 each by Does not apply route daily. E11.9      cetirizine (ZYRTEC) 5 MG tablet Take 1 tablet (5 mg total) by mouth daily. (Patient not taking: Reported on 02/24/2023) 90 tablet 0 Not Taking    diclofenac Sodium (VOLTAREN) 1 % GEL Apply 2 g topically 4 (four) times daily as needed. (Patient not taking: Reported on 02/24/2023) 50 g 1 Not Taking   Glucose Blood (ONETOUCH VERIO VI) 1 each by Other route daily. E11.9      glucose blood (ONETOUCH VERIO) test strip Use to check blood sugars once a day 100 strip 5    OneTouch Delica Lancets 30G MISC E11.9 100 each 3     Scheduled:   aspirin EC  81 mg Oral Daily   atorvastatin  20 mg Oral Daily   budesonide (PULMICORT) nebulizer solution  0.25 mg Nebulization BID   irbesartan  300 mg Oral Daily   And   hydrochlorothiazide  25 mg Oral Daily   insulin aspart  0-15 Units Subcutaneous TID WC   loratadine  10 mg Oral Daily   metoprolol tartrate  25 mg Oral BID   sodium chloride flush  3 mL Intravenous Q12H   sodium chloride flush  3 mL Intravenous Q12H   spironolactone  25 mg Oral Daily   Infusions:   sodium chloride     sodium chloride     diltiazem (CARDIZEM) infusion 5 mg/hr (02/24/23 1236)   PRN: sodium chloride, acetaminophen, hydrALAZINE, ipratropium, nitroGLYCERIN, ondansetron (ZOFRAN) IV, sodium chloride flush  Assessment: 81 yof with a history of HFpEF, HTN, HLD. Patient is presenting with new onset AF. Heparin per pharmacy consult placed for atrial fibrillation.  Pt s/p LHC with plans to resume heparin 8h after sheath removal (~1400).  Goal of Therapy:  Heparin level 0.3-0.7 units/ml Monitor platelets by anticoagulation protocol: Yes   Plan:  Resume heparin 1100 units/h no bolus at 2200 Check heparin level and CBC with am labs  Fredonia Highland, PharmD, BCPS, [facility] Clinical Pharmacist [phone] Please check AMION for all [facility] Pharmacy numbers 02/25/2023

## 2023-02-26 ENCOUNTER — Encounter (HOSPITAL_BASED_OUTPATIENT_CLINIC_OR_DEPARTMENT_OTHER): Payer: Self-pay | Admitting: *Deleted

## 2023-02-26 ENCOUNTER — Encounter (HOSPITAL_COMMUNITY): Payer: Self-pay | Admitting: Cardiovascular Disease

## 2023-02-26 DIAGNOSIS — N289 Disorder of kidney and ureter, unspecified: Secondary | ICD-10-CM

## 2023-02-26 DIAGNOSIS — I10 Essential (primary) hypertension: Secondary | ICD-10-CM

## 2023-02-26 DIAGNOSIS — I480 Paroxysmal atrial fibrillation: Secondary | ICD-10-CM | POA: Diagnosis not present

## 2023-02-26 DIAGNOSIS — I249 Acute ischemic heart disease, unspecified: Secondary | ICD-10-CM | POA: Diagnosis not present

## 2023-02-26 DIAGNOSIS — I4819 Other persistent atrial fibrillation: Secondary | ICD-10-CM | POA: Diagnosis not present

## 2023-02-26 LAB — GLUCOSE, CAPILLARY
Glucose-Capillary: 276 mg/dL — ABNORMAL HIGH (ref 70–99)
Glucose-Capillary: 218 mg/dL — ABNORMAL HIGH (ref 70–99)
Glucose-Capillary: 136 mg/dL — ABNORMAL HIGH (ref 70–99)
Glucose-Capillary: 219 mg/dL — ABNORMAL HIGH (ref 70–99)

## 2023-02-26 LAB — CBC
HCT: 35.6 % — ABNORMAL LOW (ref 36.0–46.0)
Hemoglobin: 11.7 g/dL — ABNORMAL LOW (ref 12.0–15.0)
MCH: 27.9 pg (ref 26.0–34.0)
MCHC: 32.9 g/dL (ref 30.0–36.0)
MCV: 84.8 fL (ref 80.0–100.0)
Platelets: 335 10*3/uL (ref 150–400)
RBC: 4.2 MIL/uL (ref 3.87–5.11)
RDW: 14.9 % (ref 11.5–15.5)
WBC: 9.2 10*3/uL (ref 4.0–10.5)
nRBC: 0 % (ref 0.0–0.2)

## 2023-02-26 LAB — LIPOPROTEIN A (LPA): Lipoprotein (a): 168 nmol/L — ABNORMAL HIGH (ref <75.0)

## 2023-02-26 LAB — HEPARIN LEVEL (UNFRACTIONATED): Heparin Unfractionated: 0.23 IU/mL — ABNORMAL LOW (ref 0.30–0.70)

## 2023-02-26 MED ORDER — IRBESARTAN 75 MG PO TABS
75.0000 mg | ORAL_TABLET | Freq: Every day | ORAL | Status: DC
  Administered 2023-02-27: 75 mg via ORAL
  Filled 2023-02-26: qty 1

## 2023-02-26 MED ORDER — HYDROCHLOROTHIAZIDE 25 MG PO TABS
25.0000 mg | ORAL_TABLET | Freq: Every day | ORAL | Status: DC
  Administered 2023-02-27: 25 mg via ORAL
  Filled 2023-02-26: qty 1

## 2023-02-26 MED ORDER — DILTIAZEM HCL ER COATED BEADS 120 MG PO CP24
120.0000 mg | ORAL_CAPSULE | Freq: Every day | ORAL | Status: DC
  Administered 2023-02-26 – 2023-02-27 (×2): 120 mg via ORAL
  Filled 2023-02-26 (×2): qty 1

## 2023-02-26 MED ORDER — METOPROLOL SUCCINATE ER 50 MG PO TB24
50.0000 mg | ORAL_TABLET | Freq: Every day | ORAL | Status: DC
  Administered 2023-02-26 – 2023-02-27 (×2): 50 mg via ORAL
  Filled 2023-02-26 (×2): qty 1

## 2023-02-26 MED ORDER — ALUM & MAG HYDROXIDE-SIMETH 200-200-20 MG/5ML PO SUSP: 30.0000 mL | Freq: Four times a day (QID) | ORAL | Status: DC | PRN

## 2023-02-26 MED ORDER — OFF THE BEAT BOOK
Freq: Once | Status: AC
  Filled 2023-02-26: qty 1

## 2023-02-26 MED ORDER — LIVING WELL WITH DIABETES BOOK
Freq: Once | Status: AC
  Filled 2023-02-26: qty 1

## 2023-02-26 MED ORDER — APIXABAN 5 MG PO TABS
5.0000 mg | ORAL_TABLET | Freq: Two times a day (BID) | ORAL | Status: DC
  Administered 2023-02-26 – 2023-02-27 (×3): 5 mg via ORAL
  Filled 2023-02-26 (×3): qty 1

## 2023-02-26 NOTE — Progress Notes (Addendum)
Rounding Note    Patient Name: Rachel [name] Date of Encounter: 02/26/2023  [facility] Cardiologist: Chilton Si, MD   Subjective   Resting in bed, no complaints. States she is lightheaded going to the bathroom, has not worked with PT yet  Inpatient Medications    Scheduled Meds:  aspirin EC  81 mg Oral Daily   atorvastatin  20 mg Oral Daily   budesonide (PULMICORT) nebulizer solution  0.25 mg Nebulization BID   irbesartan  300 mg Oral Daily   And   hydrochlorothiazide  25 mg Oral Daily   insulin aspart  0-15 Units Subcutaneous TID WC   loratadine  10 mg Oral Daily   metoprolol tartrate  25 mg Oral BID   sodium chloride flush  3 mL Intravenous Q12H   sodium chloride flush  3 mL Intravenous Q12H   spironolactone  25 mg Oral Daily   Continuous Infusions:  sodium chloride     diltiazem (CARDIZEM) infusion 5 mg/hr (02/26/23 0354)   heparin 1,250 Units/hr (02/26/23 0719)   PRN Meds: sodium chloride, acetaminophen, alum & mag hydroxide-simeth, ipratropium, nitroGLYCERIN, ondansetron (ZOFRAN) IV, sodium chloride flush   Vital Signs    Vitals:   02/25/23 2052 02/26/23 0330 02/26/23 0748 02/26/23 0826  BP: 123/69 (!) 112/59  117/72  Pulse: 76 83  74  Resp: 20 17    Temp: 98.1 F (36.7 C) 98.6 F (37 C)  (!) 97.5 F (36.4 C)  TempSrc:  Oral  Oral  SpO2: 98% 99% 98% 97%  Weight:      Height:        Intake/Output Summary (Last 24 hours) at 02/26/2023 0830 Last data filed at 02/26/2023 0354 Gross per 24 hour  Intake 901.31 ml  Output --  Net 901.31 ml      02/25/2023   11:39 AM 02/24/2023    9:01 AM 01/17/2023    8:15 AM  Last 3 Weights  Weight (lbs) 200 lb 9.9 oz 200 lb 200 lb 9.6 oz  Weight (kg) 91 kg 90.719 kg 90.992 kg      Telemetry    Rate controlled Afib in the 60s, no pauses greater than 3 sec - Personally Reviewed  ECG    No new tracings - Personally Reviewed  Physical Exam   GEN: No acute distress.   Neck: No  JVD Cardiac: RRR, no murmurs, rubs, or gallops.  Respiratory: Clear to auscultation bilaterally. GI: Soft, nontender, non-distended  MS: No edema; No deformity. Neuro:  Nonfocal  Psych: Normal affect  Right radial cath site C/D/I  Labs    High Sensitivity Troponin:   Recent Labs  Lab 02/24/23 0909 02/24/23 1236 02/24/23 1944  TROPONINIHS 127* 159* 217*     Chemistry Recent Labs  Lab 02/24/23 0909  NA 135  K 4.8  CL 106  CO2 20*  GLUCOSE 221*  BUN 32*  CREATININE 1.20*  CALCIUM 9.0  MG 1.9  GFRNONAA 46*  ANIONGAP 9    Lipids No results for input(s): "CHOL", "TRIG", "HDL", "LABVLDL", "LDLCALC", "CHOLHDL" in the last 168 hours.  Hematology Recent Labs  Lab 02/24/23 0909 02/25/23 0446 02/26/23 0446  WBC 8.4 9.4 9.2  RBC 4.41 4.32 4.20  HGB 12.0 11.7* 11.7*  HCT 39.0 37.4 35.6*  MCV 88.4 86.6 84.8  MCH 27.2 27.1 27.9  MCHC 30.8 31.3 32.9  RDW 14.9 14.7 14.9  PLT 324 318 335   Thyroid  Recent Labs  Lab 02/24/23 1236  TSH  1.996    BNPNo results for input(s): "BNP", "PROBNP" in the last 168 hours.  DDimer  Recent Labs  Lab 02/24/23 1544  DDIMER 0.82*     Radiology    CARDIAC CATHETERIZATION  Result Date: 02/25/2023   Prox RCA lesion is 20% stenosed.   Ost Cx to Prox Cx lesion is 20% stenosed.   Ost LAD to Prox LAD lesion is 20% stenosed.   Mid LAD lesion is 20% stenosed. 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 Her troponin elevation is likely due to demand ischemia in the setting of atrial fib with RVR.   ECHOCARDIOGRAM COMPLETE  Result Date: 02/25/2023    ECHOCARDIOGRAM REPORT   Patient Name:   Rachel [name] [facility] Date of Exam: 02/25/2023 Medical Rec #:  [id number]                  Height:       63.0 in Accession #:    [id number]                 Weight:       200.0 lb Date of Birth:  03/23/1941                 BSA:          1.934 m Patient Age:    81 years                   BP:            128/70 mmHg Patient Gender: F                          HR:           74 bpm. Exam Location:  Inpatient Procedure: 2D Echo, Cardiac Doppler and Color Doppler Indications:    CHF-Acute Diastolic I50.31  History:        Patient has prior history of Echocardiogram examinations, most                 recent 01/09/2018. CHF and Cardiomegaly, Arrythmias:Atrial                 Fibrillation, Signs/Symptoms:Shortness of Breath and Syncope;                 Risk Factors:Hypertension, Diabetes and Dyslipidemia.  Sonographer:    Lucendia Herrlich Referring Phys: [id number] PING T ZHANG IMPRESSIONS  1. Left ventricular ejection fraction, by estimation, is 65 to 70%. The left ventricle has normal function. The left ventricle has no regional wall motion abnormalities. There is mild concentric left ventricular hypertrophy. Left ventricular diastolic function could not be evaluated.  2. Right ventricular systolic function is normal. The right ventricular size is normal.  3. Left atrial size was mildly dilated.  4. The mitral valve is normal in structure. Trivial mitral valve regurgitation. No evidence of mitral stenosis.  5. The aortic valve is normal in structure. Aortic valve regurgitation is not visualized. No aortic stenosis is present.  6. The inferior vena cava is normal in size with greater than 50% respiratory variability, suggesting right atrial pressure of 3 mmHg. FINDINGS  Left Ventricle: Left ventricular ejection fraction, by estimation, is 65 to 70%. The left ventricle has normal function. The left ventricle has no regional wall motion abnormalities. The left ventricular internal cavity size was normal in size. There is  mild concentric left ventricular hypertrophy. Left ventricular diastolic  function could not be evaluated due to atrial fibrillation. Left ventricular diastolic function could not be evaluated. Right Ventricle: The right ventricular size is normal. No increase in right ventricular wall thickness. Right  ventricular systolic function is normal. Left Atrium: Left atrial size was mildly dilated. Right Atrium: Right atrial size was normal in size. Pericardium: There is no evidence of pericardial effusion. Mitral Valve: The mitral valve is normal in structure. Trivial mitral valve regurgitation. No evidence of mitral valve stenosis. Tricuspid Valve: The tricuspid valve is normal in structure. Tricuspid valve regurgitation is mild . No evidence of tricuspid stenosis. Aortic Valve: The aortic valve is normal in structure. Aortic valve regurgitation is not visualized. No aortic stenosis is present. Aortic valve peak gradient measures 9.1 mmHg. Pulmonic Valve: The pulmonic valve was normal in structure. Pulmonic valve regurgitation is trivial. No evidence of pulmonic stenosis. Aorta: The aortic root is normal in size and structure. Venous: The inferior vena cava is normal in size with greater than 50% respiratory variability, suggesting right atrial pressure of 3 mmHg. IAS/Shunts: No atrial level shunt detected by color flow Doppler.  LEFT VENTRICLE PLAX 2D LVIDd:         3.70 cm   Diastology LVIDs:         2.50 cm   LV e' medial:    6.85 cm/s LV PW:         1.30 cm   LV E/e' medial:  15.9 LV IVS:        1.10 cm   LV e' lateral:   8.16 cm/s LVOT diam:     1.70 cm   LV E/e' lateral: 13.4 LV SV:         40 LV SV Index:   21 LVOT Area:     2.27 cm  RIGHT VENTRICLE            IVC RV S prime:     8.05 cm/s  IVC diam: 2.00 cm TAPSE (M-mode): 1.0 cm LEFT ATRIUM             Index        RIGHT ATRIUM           Index LA diam:        3.50 cm 1.81 cm/m   RA Area:     14.30 cm LA Vol (A2C):   36.8 ml 19.03 ml/m  RA Volume:   34.00 ml  17.58 ml/m LA Vol (A4C):   47.8 ml 24.72 ml/m LA Biplane Vol: 45.4 ml 23.48 ml/m  AORTIC VALVE AV Area (Vmax): 1.34 cm AV Vmax:        151.00 cm/s AV Peak Grad:   9.1 mmHg LVOT Vmax:      89.23 cm/s LVOT Vmean:     58.400 cm/s LVOT VTI:       0.178 m  AORTA Ao Root diam: 2.40 cm Ao Asc diam:  2.80  cm MITRAL VALVE                TRICUSPID VALVE MV Area (PHT): 4.15 cm     TR Peak grad:   36.7 mmHg MV Decel Time: 183 msec     TR Vmax:        303.00 cm/s MR Peak grad: 94.5 mmHg MR Vmax:      486.00 cm/s   SHUNTS MV E velocity: 109.00 cm/s  Systemic VTI:  0.18 m MV A velocity: 37.20 cm/s   Systemic Diam: 1.70 cm MV E/A ratio:  2.93 Arvilla Meres MD Electronically signed by Arvilla Meres MD Signature Date/Time: 02/25/2023/11:20:33 AM    Final    CT Cervical Spine Wo Contrast  Result Date: 02/24/2023 CLINICAL DATA:  82 year old female with syncope, chest tightness, shortness of breath. Pain and nausea. EXAM: CT CERVICAL SPINE WITHOUT CONTRAST TECHNIQUE: Multidetector CT imaging of the cervical spine was performed without intravenous contrast. Multiplanar CT image reconstructions were also generated. RADIATION DOSE REDUCTION: This exam was performed according to the departmental dose-optimization program which includes automated exposure control, adjustment of the mA and/or kV according to patient size and/or use of iterative reconstruction technique. COMPARISON:  Head CT today. FINDINGS: Alignment: Normal cervical lordosis. Cervicothoracic junction alignment is within normal limits. Bilateral posterior element alignment is within normal limits. Skull base and vertebrae: Bone mineralization is within normal limits for age. Visualized skull base is intact. No atlanto-occipital dissociation. C1 and C2 appear intact and aligned. No acute osseous abnormality identified. Soft tissues and spinal canal: No prevertebral fluid or swelling. No visible canal hematoma. Bulky calcified proximal right ICA atherosclerosis, otherwise negative visible noncontrast neck soft tissues. Disc levels: Upper cervical facet arthropathy but otherwise mild for age cervical spine degeneration. Upper chest: Calcified aortic atherosclerosis. Mild respiratory motion. Visible upper thoracic levels appear grossly intact. IMPRESSION: 1. No  acute traumatic injury identified in the cervical spine. 2. Mild for age cervical spine degeneration aside from upper cervical facet arthropathy. 3. Bulky calcified right carotid atherosclerosis. Electronically Signed   By: Odessa Fleming M.D.   On: 02/24/2023 10:44   CT Head Wo Contrast  Result Date: 02/24/2023 CLINICAL DATA:  82 year old female with syncope, chest tightness, shortness of breath. Pain and nausea. EXAM: CT HEAD WITHOUT CONTRAST TECHNIQUE: Contiguous axial images were obtained from the base of the skull through the vertex without intravenous contrast. RADIATION DOSE REDUCTION: This exam was performed according to the departmental dose-optimization program which includes automated exposure control, adjustment of the mA and/or kV according to patient size and/or use of iterative reconstruction technique. COMPARISON:  None Available. FINDINGS: Brain: Occasional small areas of dural calcification (normal variant more pronounced in the right hemisphere. Cerebral volume is within normal limits for age. No midline shift, ventriculomegaly, mass effect, evidence of mass lesion, intracranial hemorrhage or evidence of cortically based acute infarction. Gray-white matter differentiation mostly normal for age. There is minimal to mild left frontal lobe white matter hypodensity. No cortical encephalomalacia. Vascular: Calcified atherosclerosis at the skull base. No suspicious intracranial vascular hyperdensity. Skull: No acute osseous abnormality identified. Sinuses/Orbits: Visualized paranasal sinuses and mastoids are clear. Other: No orbit or scalp soft tissue injury identified. IMPRESSION: 1. No acute intracranial abnormality or acute traumatic injury identified. 2. Largely normal for age; minimal cerebral white matter changes, calcified atherosclerosis. Electronically Signed   By: Odessa Fleming M.D.   On: 02/24/2023 10:42   DG Chest Portable 1 View  Result Date: 02/24/2023 CLINICAL DATA:  Chest pain.  Syncopal  episode. EXAM: PORTABLE CHEST 1 VIEW COMPARISON:  01/13/2023. FINDINGS: Cardiac silhouette is mildly enlarged. No mediastinal or hilar masses. Clear lungs.  No pleural effusion or pneumothorax. Skeletal structures are grossly intact. IMPRESSION: No active disease. Electronically Signed   By: Amie Portland M.D.   On: 02/24/2023 09:55    Cardiac Studies   Echo 02/25/23: 1. Left ventricular ejection fraction, by estimation, is 65 to 70%. The  left ventricle has normal function. The left ventricle has no regional  wall motion abnormalities. There is mild concentric left ventricular  hypertrophy.  Left ventricular diastolic  function could not be evaluated.   2. Right ventricular systolic function is normal. The right ventricular  size is normal.   3. Left atrial size was mildly dilated.   4. The mitral valve is normal in structure. Trivial mitral valve  regurgitation. No evidence of mitral stenosis.   5. The aortic valve is normal in structure. Aortic valve regurgitation is  not visualized. No aortic stenosis is present.   6. The inferior vena cava is normal in size with greater than 50%  respiratory variability, suggesting right atrial pressure of 3 mmHg.    LHC 02/25/23:   Prox RCA lesion is 20% stenosed.   Ost Cx to Prox Cx lesion is 20% stenosed.   Ost LAD to Prox LAD lesion is 20% stenosed.   Mid LAD lesion is 20% stenosed.   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 Her troponin elevation is likely due to demand ischemia in the setting of atrial fib with RVR.   Patient Profile     82 y.o. female with hx of HTN, Asthma, DM, HFpEF followed in advanced HTN clinic admitted 02/24/23 with epigastric burning, syncope after standing and new atrial fib.   Assessment & Plan    Atrial fibrillation - new diagnosis this admission - placed on cardizem gtt - telemetry with rate controlled Afib, no pauses greater than 3 sec - will need to  avoid sudafed containing products - will transition IV cardizem to 120 mg daily PO - convert 25 mg lopressor TID to 50 mg toprol at night and monitor - BP well controlled, monitor this afternoon on PO medications - she does report mild lightheadedness, but hasn't worked with PT - discussed cardioversion and she wants to save this as a last resort - discussed that if BP meds adjusted and she still has symptoms, may need to trial DCCV to restore SR   Chronic anticoagulation - on heparin gtt - transition to eliquis - 5 mg BID   Elevated troponin Epigastric pain - HS troponin 127 --> 159 --> 217 - heart cath with only mild nonobstructive disease - continue risk factor modification for mild CAD and family history of early CAAD - on ASA - consider holding ASA in the setting of eliquis given only mild disease - consider GI workup for epigastric pain felt noncardiac given cath   Syncope - CT head negative - no driving for 6 months - given newly recognized Afib, can consider OP monitor - echo with preserved EF and mild LVH, normal RV, no significant valvular disease   Hypertension - 120 mg cardizem, 300 mg irbesartan, 25 mg HCTZ - she states she is still lightheaded, but has not walked with PT yet - I will reduce irbesartan to 75 mg, unfortunately, she has already received both irbesartan and HCTZ today - will monitor symptoms when up and walking with PT        For questions or updates, please contact [facility] Please consult [email or web address] for contact info under        Signed, Marcelino Duster, PA  02/26/2023, 8:30 AM    Attending Note:   The patient was seen and examined.  Agree with assessment and plan as noted above.  Changes made to the above note as needed.  Patient seen and independently examined with Bettina Gavia, PA .   We discussed all aspects of the encounter. I agree with the assessment and plan as  stated above.    Atrial fib : rate is well controlled.    Transition to eliquis   2.  + troponin:   cath shows only minimal disease.  Agree with holding ASA since she will be on eliquis   3.  HTN:  BP is well controlled this am   She appears to be doing well I anticipate that she could be discharged soon, perhaps tomorrow    I have spent a total of 40 minutes with patient reviewing hospital  notes , telemetry, EKGs, labs and examining patient as well as establishing an assessment and plan that was discussed with the patient.  > 50% of time was spent in direct patient care.    Vesta Mixer, Montez Hageman., MD, [facility] 02/26/2023, 10:22 AM [address],  [address] Office [phone] Pager [phone]

## 2023-02-26 NOTE — Progress Notes (Addendum)
ANTICOAGULATION CONSULT NOTE  Pharmacy Consult for Heparin Indication: atrial fibrillation  Allergies  Allergen Reactions   Aspirin Nausea And Vomiting and Other (See Comments)    Uncoated version = "Jittery and GI upset" (has tolerated the chewable version)   Atorvastatin Other (See Comments)    Caused aching in the body   Codeine Sulfate Nausea And Vomiting and Other (See Comments)    Heart races also   Glimepiride Diarrhea   Metformin Diarrhea   Prandin [Repaglinide] Other (See Comments)    "it upset my stomach"   Propoxyphene N-Acetaminophen Nausea And Vomiting    Patient Measurements: Height: 5\' 3"  (160 cm) Weight: 91 kg (200 lb 9.9 oz) IBW/kg (Calculated) : 52.4 Heparin Dosing Weight: 73.1 kg  Vital Signs: Temp: 98.6 F (37 C) (06/04 0330) Temp Source: Oral (06/04 0330) BP: 112/59 (06/04 0330) Pulse Rate: 83 (06/04 0330)  Labs: Recent Labs    02/24/23 0909 02/24/23 1236 02/24/23 1944 02/24/23 2140 02/25/23 0446 02/26/23 0446  HGB 12.0  --   --   --  11.7* 11.7*  HCT 39.0  --   --   --  37.4 35.6*  PLT 324  --   --   --  318 335  HEPARINUNFRC  --   --   --  0.66 0.66 0.23*  CREATININE 1.20*  --   --   --   --   --   TROPONINIHS 127* 159* 217*  --   --   --      Estimated Creatinine Clearance: 40.7 mL/min (A) (by C-G formula based on SCr of 1.2 mg/dL (H)).   Medical History: Past Medical History:  Diagnosis Date   (HFpEF) heart failure with preserved ejection fraction (HCC) 01/17/2023   Allergic rhinitis    Diabetes mellitus, type 2 (HCC)    Hyperlipemia    Hypertension    Left shoulder pain 11/01/2021   Lipoma NEC    anterior upper chest   Osteoarthritis of knee    Shortness of breath 11/01/2021    Medications:  Medications Prior to Admission  Medication Sig Dispense Refill Last Dose   acetaminophen (TYLENOL) 500 MG tablet Take 500 mg by mouth every 8 (eight) hours as needed for mild pain or headache.   unk   albuterol (VENTOLIN HFA) 108  (90 Base) MCG/ACT inhaler INHALE 1-2 PUFFS BY MOUTH EVERY 6 HOURS AS NEEDED FOR WHEEZE OR SHORTNESS OF BREATH (Patient taking differently: Inhale 1-2 puffs into the lungs every 6 (six) hours as needed for wheezing or shortness of breath.) 18 each 1 02/22/2023   amLODipine (NORVASC) 2.5 MG tablet Take 1 tablet (2.5 mg total) by mouth daily. 30 tablet 2 02/24/2023 at am   fluticasone (FLONASE) 50 MCG/ACT nasal spray Place 2 sprays into both nostrils daily as needed for allergies or rhinitis.   unk   loratadine (CLARITIN) 10 MG tablet Take 1 tablet (10 mg total) by mouth daily as needed for allergies. (Patient taking differently: Take 10 mg by mouth in the morning.) 30 tablet 0 02/23/2023 at am   Multiple Vitamins-Minerals (HAIR SKIN AND NAILS FORMULA) TABS Take 1 tablet by mouth daily.   Past Week   nystatin (MYCOSTATIN/NYSTOP) powder Apply topically 4 (four) times daily. (Patient taking differently: Apply topically 4 (four) times daily as needed (for rashes).) 60 g 0 unk   nystatin cream (MYCOSTATIN) Apply 1 application topically 2 (two) times daily. (Patient taking differently: Apply 1 application  topically 2 (two) times daily as needed  for dry skin (or rashes).) 30 g 1 unk   [EXPIRED] predniSONE (DELTASONE) 20 MG tablet Take 2 tablets (40 mg total) by mouth daily with breakfast for 3 days. 6 tablet 0 02/22/2023 at SEE NOTE   spironolactone (ALDACTONE) 25 MG tablet Take 1 tablet (25 mg total) by mouth daily. 30 tablet 2 02/24/2023 at am   valsartan-hydrochlorothiazide (DIOVAN-HCT) 320-25 MG tablet Take 1 tablet by mouth daily. 90 tablet 3 02/24/2023 at am   atorvastatin (LIPITOR) 20 MG tablet Take 1 tablet (20 mg total) by mouth daily. (Patient not taking: Reported on 02/24/2023) 90 tablet 3 Not Taking   Blood Glucose Monitoring Suppl (ONETOUCH VERIO REFLECT) w/Device KIT 1 each by Does not apply route daily. E11.9      cetirizine (ZYRTEC) 5 MG tablet Take 1 tablet (5 mg total) by mouth daily. (Patient not taking:  Reported on 02/24/2023) 90 tablet 0 Not Taking   diclofenac Sodium (VOLTAREN) 1 % GEL Apply 2 g topically 4 (four) times daily as needed. (Patient not taking: Reported on 02/24/2023) 50 g 1 Not Taking   Glucose Blood (ONETOUCH VERIO VI) 1 each by Other route daily. E11.9      glucose blood (ONETOUCH VERIO) test strip Use to check blood sugars once a day 100 strip 5    OneTouch Delica Lancets 30G MISC E11.9 100 each 3     Scheduled:   aspirin EC  81 mg Oral Daily   atorvastatin  20 mg Oral Daily   budesonide (PULMICORT) nebulizer solution  0.25 mg Nebulization BID   irbesartan  300 mg Oral Daily   And   hydrochlorothiazide  25 mg Oral Daily   insulin aspart  0-15 Units Subcutaneous TID WC   loratadine  10 mg Oral Daily   metoprolol tartrate  25 mg Oral BID   sodium chloride flush  3 mL Intravenous Q12H   sodium chloride flush  3 mL Intravenous Q12H   spironolactone  25 mg Oral Daily   Infusions:   sodium chloride     diltiazem (CARDIZEM) infusion 5 mg/hr (02/26/23 0354)   heparin 1,100 Units/hr (02/26/23 0354)   PRN: sodium chloride, acetaminophen, ipratropium, nitroGLYCERIN, ondansetron (ZOFRAN) IV, sodium chloride flush  Assessment: 81 yof with a history of HFpEF, HTN, HLD. Patient is presenting with new onset AF. Heparin per pharmacy consult placed for atrial fibrillation.  Pt s/p LHC on heparin. Heparin level 0.23 on 1100 units/hr  Goal of Therapy:  Heparin level 0.3-0.7 units/ml Monitor platelets by anticoagulation protocol: Yes   Plan:  -Increase heparin to 1250 unit/hr -Will follow plans for oral anticoagulation  Harland German, PharmD Clinical Pharmacist **Pharmacist phone directory can now be found on [email or web address] ([email or web address]).  Listed under [facility].   Addendum -transitioning to apixaban  Plan -Stop heparin -Start apixaban 5mg  po bid  Harland German, PharmD Clinical Pharmacist **Pharmacist phone directory can now be found on [email or web address] ([email or web address]).  Listed under [facility]armacy.

## 2023-02-26 NOTE — Plan of Care (Signed)
  Problem: Education: Goal: Understanding of cardiac disease, CV risk reduction, and recovery process will improve Outcome: Progressing Goal: Individualized Educational Video(s) Outcome: Progressing   Problem: Activity: Goal: Ability to tolerate increased activity will improve Outcome: Progressing   Problem: Cardiac: Goal: Ability to achieve and maintain adequate cardiovascular perfusion will improve Outcome: Progressing   Problem: Health Behavior/Discharge Planning: Goal: Ability to safely manage health-related needs after discharge will improve Outcome: Progressing   Problem: Education: Goal: Ability to describe self-care measures that may prevent or decrease complications (Diabetes Survival Skills Education) will improve Outcome: Progressing Goal: Individualized Educational Video(s) Outcome: Progressing   Problem: Coping: Goal: Ability to adjust to condition or change in health will improve Outcome: Progressing   Problem: Fluid Volume: Goal: Ability to maintain a balanced intake and output will improve Outcome: Progressing   Problem: Health Behavior/Discharge Planning: Goal: Ability to identify and utilize available resources and services will improve Outcome: Progressing Goal: Ability to manage health-related needs will improve Outcome: Progressing   Problem: Metabolic: Goal: Ability to maintain appropriate glucose levels will improve Outcome: Progressing   Problem: Nutritional: Goal: Maintenance of adequate nutrition will improve Outcome: Progressing Goal: Progress toward achieving an optimal weight will improve Outcome: Progressing   Problem: Skin Integrity: Goal: Risk for impaired skin integrity will decrease Outcome: Progressing   Problem: Tissue Perfusion: Goal: Adequacy of tissue perfusion will improve Outcome: Progressing   Problem: Education: Goal: Knowledge of General Education information will improve Description: Including pain rating scale,  medication(s)/side effects and non-pharmacologic comfort measures Outcome: Progressing   Problem: Health Behavior/Discharge Planning: Goal: Ability to manage health-related needs will improve Outcome: Progressing   Problem: Clinical Measurements: Goal: Ability to maintain clinical measurements within normal limits will improve Outcome: Progressing Goal: Will remain free from infection Outcome: Progressing Goal: Diagnostic test results will improve Outcome: Progressing Goal: Respiratory complications will improve Outcome: Progressing Goal: Cardiovascular complication will be avoided Outcome: Progressing   Problem: Activity: Goal: Risk for activity intolerance will decrease Outcome: Progressing   Problem: Nutrition: Goal: Adequate nutrition will be maintained Outcome: Progressing   Problem: Coping: Goal: Level of anxiety will decrease Outcome: Progressing   Problem: Elimination: Goal: Will not experience complications related to bowel motility Outcome: Progressing Goal: Will not experience complications related to urinary retention Outcome: Progressing   Problem: Pain Managment: Goal: General experience of comfort will improve Outcome: Progressing   Problem: Safety: Goal: Ability to remain free from injury will improve Outcome: Progressing   Problem: Skin Integrity: Goal: Risk for impaired skin integrity will decrease Outcome: Progressing   Problem: Education: Goal: Understanding of CV disease, CV risk reduction, and recovery process will improve Outcome: Progressing Goal: Individualized Educational Video(s) Outcome: Progressing   Problem: Activity: Goal: Ability to return to baseline activity level will improve Outcome: Progressing   Problem: Cardiovascular: Goal: Ability to achieve and maintain adequate cardiovascular perfusion will improve Outcome: Progressing Goal: Vascular access site(s) Level 0-1 will be maintained Outcome: Progressing   Problem:  Health Behavior/Discharge Planning: Goal: Ability to safely manage health-related needs after discharge will improve Outcome: Progressing   

## 2023-02-26 NOTE — TOC CM/SW Note (Signed)
Transition of Care [facility]) - Inpatient Brief Assessment   Patient Details  Name: Rachel [name] MRN: [id number] Date of Birth: December 17, 1940  Transition of Care [facility]) CM/SW Contact:    Gala Lewandowsky, RN Phone Number: 02/26/2023, 3:44 PM   Clinical Narrative: Patient presented for syncopal episode. PTA patient reports that she was independent from home alone and still drives to appointments. Daughter at the bedside during the visit. Patient states family checks in often. Case Manager discussed Eliquis cost of $47.00. Case Manager will continue to follow for transition of care needs as the patient progresses.    Transition of Care Asessment: Insurance and Status: Insurance coverage has been reviewed Patient has primary care physician: Yes Home environment has been reviewed: reviewed Prior level of function:: independent Prior/Current Home Services: No current home services Social Determinants of Health Reivew: SDOH reviewed no interventions necessary Readmission risk has been reviewed: Yes Transition of care needs: no transition of care needs at this time

## 2023-02-26 NOTE — Telephone Encounter (Signed)
3 rd attempt Printed, highlighted comments, and mailed lab orders  Updated Rx sent to pharmacy

## 2023-02-26 NOTE — Progress Notes (Addendum)
Physical Therapy Treatment Patient Details Name: Rachel [name] MRN: [id number] DOB: Apr 11, 1941 Today's Date: 02/26/2023   History of Present Illness Pt is 83 year old presented to [facility] on  02/24/23 for syncopal episode and chest pain. Pt with new onset afib. Pt for heart cath 6/3.  PMH - HTN, OA, chf, asthma    PT Comments    Pt with improving mobility but not yet back to baseline. Typically is independent and works. Pt c/o of some mild dizziness/lightheadedness but able to continue amb. Orthostatics taken at end of session (unable to take at beginning of session since pt was already moving toward the bathroom) and were negative. Was able to amb several hundred feet in hallway and used rollator in case of incr lightheadedness. I expect she will continue to progress toward baseline. Will see tomorrow to assess need for home equipment and practice stairs.    Orthostatic BPs  Sitting 110/58  Standing 118/81  Standing after 3 min 119/51     Recommendations for follow up therapy are one component of a multi-disciplinary discharge planning process, led by the attending physician.  Recommendations may be updated based on patient status, additional functional criteria and insurance authorization.  Follow Up Recommendations       Assistance Recommended at Discharge Intermittent Supervision/Assistance  Patient can return home with the following Help with stairs or ramp for entrance;Assist for transportation;Assistance with cooking/housework   Equipment Recommendations  Rollator (4 wheels) (May progress and not need)    Recommendations for Other Services       Precautions / Restrictions Precautions Precautions: Other (comment) Precaution Comments: syncope, radial cath on 6/3     Mobility  Bed Mobility Overal bed mobility: Needs Assistance Bed Mobility: Supine to Sit     Supine to sit: Min assist     General bed mobility comments: Assist to elevate trunk since not  using RUE due to recent radial cath    Transfers Overall transfer level: Needs assistance Equipment used: Rollator (4 wheels), None Transfers: Sit to/from Stand Sit to Stand: Min guard           General transfer comment: Assist for safety and lines    Ambulation/Gait Ambulation/Gait assistance: Supervision Gait Distance (Feet): 200 Feet Assistive device: Rollator (4 wheels) Gait Pattern/deviations: Step-through pattern, Decreased stride length Gait velocity: decr Gait velocity interpretation: 1.31 - 2.62 ft/sec, indicative of limited community ambulator   General Gait Details: Slow gait. Used rollator for safety due to reports of dizziness/lightheadedness   Stairs             Wheelchair Mobility    Modified Rankin (Stroke Patients Only)       Balance Overall balance assessment: Mild deficits observed, not formally tested                                          Cognition Arousal/Alertness: Awake/alert Behavior During Therapy: WFL for tasks assessed/performed Overall Cognitive Status: Within Functional Limits for tasks assessed                                          Exercises      General Comments General comments (skin integrity, edema, etc.): See assessment for orthostatics      Pertinent Vitals/Pain Pain Assessment Pain Assessment: No/denies  pain    Home Living                          Prior Function            PT Goals (current goals can now be found in the care plan section) Acute Rehab PT Goals Patient Stated Goal: return home and to work Progress towards PT goals: Progressing toward goals    Frequency    Min 1X/week      PT Plan      Co-evaluation              AM-PAC PT "6 Clicks" Mobility   Outcome Measure  Help needed turning from your back to your side while in a flat bed without using bedrails?: None Help needed moving from lying on your back to sitting on the  side of a flat bed without using bedrails?: A Little Help needed moving to and from a bed to a chair (including a wheelchair)?: A Little Help needed standing up from a chair using your arms (e.g., wheelchair or bedside chair)?: A Little Help needed to walk in hospital room?: A Little Help needed climbing 3-5 steps with a railing? : A Little 6 Click Score: 19    End of Session Equipment Utilized During Treatment: Gait belt Activity Tolerance: Patient tolerated treatment well Patient left: with call bell/phone within reach;in chair Nurse Communication: Mobility status PT Visit Diagnosis: Other abnormalities of gait and mobility (R26.89);Muscle weakness (generalized) (M62.81)     Time: 1191-4782 PT Time Calculation (min) (ACUTE ONLY): 28 min  Charges:  $Gait Training: 23-37 mins                     [facility] PT Acute Rehabilitation Services Office [phone]    Angelina Ok [facility] 02/26/2023, 10:04 AM

## 2023-02-26 NOTE — Discharge Instructions (Signed)

## 2023-02-26 NOTE — Telephone Encounter (Signed)
This encounter was created in error - please disregard.

## 2023-02-26 NOTE — Telephone Encounter (Signed)
-----   Message from Chilton Si, MD sent at 02/20/2023  8:22 AM EDT ----- Potassium is mildly elevated and kidney function is mildly abnormal.  Reduce spironolactone to 12.5 mg.  Repeat BMP in 1 week.

## 2023-02-26 NOTE — Progress Notes (Signed)
Mobility Specialist Progress Note:   02/26/23 1500  Mobility  Activity Ambulated with assistance in hallway  Level of Assistance Contact guard assist, steadying assist  Assistive Device None;Other (Comment) (hallway handrails)  Distance Ambulated (ft) 250 ft  Activity Response Tolerated well  Mobility Referral Yes  $Mobility charge 1 Mobility  Mobility Specialist Start Time (ACUTE ONLY) 1500  Mobility Specialist Stop Time (ACUTE ONLY) 1510  Mobility Specialist Time Calculation (min) (ACUTE ONLY) 10 min   Pt received in chair, agreeable to mobility session. Required minG assist during ambulation d/t minor lightheadedness. BP 129/84 upon return to room. Pt left sitting in chair with all needs met.   Addison Lank Mobility Specialist Please contact via SecureChat or  Rehab office at [phone]

## 2023-02-26 NOTE — Progress Notes (Signed)
PROGRESS NOTE Rosi Caffee  ZOX:[id number] DOB: January 22, 1941 DOA: 02/24/2023 PCP: Rema Fendt, NP  Brief Narrative/Hospital Course: 82 y.o.f w/ moderate asthma, HTN, chronic HFpEF, HLD, IIDM, came with palpitation, chest pain and syncope episode . She has a poorly controlled asthma,  and has severe seasonal allergy.  Last Wednesday she started to have runny nose and dry cough and wheezing and she went to see urgent care on Friday and was prescribed with prednisone.  Recently, her blood pressure has been poorly controlled she went to see her cardiologist about 1 month ago who increased her blood pressure medication- cardiology added combined pill of Diovan/HCTZ.  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 She fell forward and hit her right-sided rib cage but no head injury.  She estimated only LOC for a few seconds, no tongue biting or loss control of urine or bowel movement and no postictal confusion.She described the brief episode of chest pain this burning sensation nonradiating 3-4/10 and dissipated after strict return her consciousness.    ED Course: was in rapid A-fib, BP140/100.  CXR-negative for acute findings.  CT head and neck: negative for acute intracranial abnormalities or dislocation or fracture.Blood work showed K4.5, creatinine 1.2, glucose 220. Patient was admitted for New onset of A-fib and chest pain with troponin elevation probably demanding ischemia, cardiology was consulted placed on heparin drip and admitted. Underwent cardiac cath 6/3 no significant CAD. Echo showed EF 55 to 70%, no RWMA, normal right ventricular systolic function, no mitral stenosis aortic valve no aortic stenosis. overall patient is clinically improving on aspirin statin metoprolol Aldactone, Heparin drip for a fib>    Subjective: Seen and examined Overnight afebrile   HR stable I na fib C/o lightheadedness this am on walking. No chest pain or shortness of breath  Assessment and Plan: Principal Problem:   Afib (HCC) Active Problems:   Acute bronchitis   A-fib (HCC)   Syncope, vasovagal   New onset A-fib with RVR new diagnosis placed on heparin drip due to high CHA2DS2-VASc score of 6, started on metoprolol> dose increased to 50 mg bid 6/4- Echo showed EF 55 to 70%, no RWMA, normal right ventricular systolic function, no mitral stenosis aortic valve no aortic stenosis. Monitor HR, meds adjustment per cardio-patient wants to reserve cardioversion as a last resort.  Elevated troponin and epigastric pain unclear etiology GI versus demand ischemia.  Underwent cardiac cath 6/3 no significant CAD. Recent Labs    02/24/23 0909 02/24/23 1236 02/24/23 1944  TROPONINIHS 127* 159* 217*    Episode of syncope: LVOT gradient on prior echo: could be contributing to syncope??  Follow-up echo stable cont ptot  Moderate asthma:, Continue Pulmicort and bronchodilator, not needing oxygen.  Hypertension Chronic HFpEF: BP stable. volume status stable continue Lopressora,avapro, Aldactone and HCTZ.  DM: Blood sugar poorly controlled in 200, recently no longer taking Prandin due to stomach upset. Continue SSI, f/uHbA1c Recent Labs  Lab 02/25/23 0814 02/25/23 1237 02/25/23 1644 02/25/23 2117 02/26/23 0825  GLUCAP 243* 158* 235* 145* 276*    Class I Obesity:Patient's Body mass index is 35.54 kg/m. : Will benefit with PCP follow-up, weight loss  healthy lifestyle and outpatient sleep evaluation.  DVT prophylaxis: heparin Code Status:   Code Status: Full Code Family Communication: plan of care discussed with patient at bedside. Patient status is:  admitted as observation but remains hospitalized for  ongoing  because of  A FIB new onset.  Level of care: Progressive  Dispo: The patient is from: HOME ALONE            Anticipated disposition: Anticipate  discharge home likely tomorrow  Objective: Vitals last 24 hrs: Vitals:   02/25/23 2052 02/26/23 0330 02/26/23 0748 02/26/23 0826  BP: 123/69 (!) 112/59  117/72  Pulse: 76 83  74  Resp: 20 17    Temp: 98.1 F (36.7 C) 98.6 F (37 C)  (!) 97.5 F (36.4 C)  TempSrc:  Oral  Oral  SpO2: 98% 99% 98% 97%  Weight:      Height:       Weight change: 0.281 kg  Physical Examination: General exam: AAox3, weak,older appearing HEENT:Oral mucosa moist, Ear/Nose WNL grossly, dentition normal. Respiratory system: bilaterally clearBS, no use of accessory muscle Cardiovascular system: S1 & S2 +, irregular rhythm Gastrointestinal system: Abdomen soft, NT,ND,BS+ Nervous System:Alert, awake, moving extremities and grossly nonfocal Extremities: LE ankle edema neg, lower extremities warm Skin: No rashes,no icterus. MSK: Normal muscle bulk,tone, power   Medications reviewed:  Scheduled Meds:  aspirin EC  81 mg Oral Daily   atorvastatin  20 mg Oral Daily   budesonide (PULMICORT) nebulizer solution  0.25 mg Nebulization BID   diltiazem  120 mg Oral Daily   [START ON 02/27/2023] irbesartan  75 mg Oral Daily   And   [START ON 02/27/2023] hydrochlorothiazide  25 mg Oral Daily   insulin aspart  0-15 Units Subcutaneous TID WC   loratadine  10 mg Oral Daily   metoprolol succinate  50 mg Oral Daily   sodium chloride flush  3 mL Intravenous Q12H   sodium chloride flush  3 mL Intravenous Q12H   spironolactone  25 mg Oral Daily   Continuous Infusions:  sodium chloride     heparin 1,250 Units/hr (02/26/23 0719)    Diet Order             Diet Heart Room service appropriate? Yes; Fluid consistency: Thin  Diet effective now                  Intake/Output Summary (Last 24 hours) at 02/26/2023 1015 Last data filed at 02/26/2023 0354 Gross per 24 hour  Intake 901.31 ml  Output --  Net 901.31 ml   Net IO Since Admission: 1,138.78 mL [02/26/23 1015]  Wt Readings from Last 3 Encounters:  02/25/23 91  kg  01/17/23 91 kg  01/13/23 86.2 kg     Unresulted Labs (From admission, onward)     Start     Ordered   02/26/23 0500  Heparin level (unfractionated)  Daily,   R      02/25/23 1430   02/25/23 1105  Hemoglobin A1c  Add-on,   AD        02/25/23 1104   02/25/23 0500  Lipoprotein A (LPA)  Tomorrow morning,   R        02/24/23 1336   02/25/23 0500  CBC  Daily,   R      02/24/23 2239          Data Reviewed: I have personally reviewed following labs and imaging studies CBC: Recent Labs  Lab 02/24/23 0909 02/25/23 0446 02/26/23 0446  WBC 8.4 9.4 9.2  HGB 12.0 11.7* 11.7*  HCT 39.0 37.4 35.6*  MCV 88.4 86.6 84.8  PLT 324 318 335   Basic Metabolic Panel: Recent Labs  Lab 02/24/23 0909  NA  135  K 4.8  CL 106  CO2 20*  GLUCOSE 221*  BUN 32*  CREATININE 1.20*  CALCIUM 9.0  MG 1.9  CBG: Recent Labs  Lab 02/25/23 0814 02/25/23 1237 02/25/23 1644 02/25/23 2117 02/26/23 0825  GLUCAP 243* 158* 235* 145* 276*   Recent Labs    02/24/23 1236  TSH 1.996  No results found for this or any previous visit (from the past 240 hour(s)).  Antimicrobials: Anti-infectives (From admission, onward)    None      Culture/Microbiology No results found for: "SDES", "SPECREQUEST", "CULT", "REPTSTATUS"   Radiology Studies: CARDIAC CATHETERIZATION  Result Date: 02/25/2023   Prox RCA lesion is 20% stenosed.   Ost Cx to Prox Cx lesion is 20% stenosed.   Ost LAD to Prox LAD lesion is 20% stenosed.   Mid LAD lesion is 20% stenosed. 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 Her troponin elevation is likely due to demand ischemia in the setting of atrial fib with RVR.   ECHOCARDIOGRAM COMPLETE  Result Date: 02/25/2023    ECHOCARDIOGRAM REPORT   Patient Name:   MARLINE INSCOE [facility] Date of Exam: 02/25/2023 Medical Rec #:  [id number]                  Height:       63.0 in Accession #:    [id number]                 Weight:        200.0 lb Date of Birth:  08/10/1941                 BSA:          1.934 m Patient Age:    81 years                   BP:           128/70 mmHg Patient Gender: F                          HR:           74 bpm. Exam Location:  Inpatient Procedure: 2D Echo, Cardiac Doppler and Color Doppler Indications:    CHF-Acute Diastolic I50.31  History:        Patient has prior history of Echocardiogram examinations, most                 recent 01/09/2018. CHF and Cardiomegaly, Arrythmias:Atrial                 Fibrillation, Signs/Symptoms:Shortness of Breath and Syncope;                 Risk Factors:Hypertension, Diabetes and Dyslipidemia.  Sonographer:    Lucendia Herrlich Referring Phys: [id number] PING T ZHANG IMPRESSIONS  1. Left ventricular ejection fraction, by estimation, is 65 to 70%. The left ventricle has normal function. The left ventricle has no regional wall motion abnormalities. There is mild concentric left ventricular hypertrophy. Left ventricular diastolic function could not be evaluated.  2. Right ventricular systolic function is normal. The right ventricular size is normal.  3. Left atrial size was mildly dilated.  4. The mitral valve is normal in structure. Trivial mitral valve regurgitation. No evidence of mitral stenosis.  5. The aortic valve is normal in structure. Aortic valve regurgitation is not visualized. No aortic stenosis is present.  6. The inferior vena  cava is normal in size with greater than 50% respiratory variability, suggesting right atrial pressure of 3 mmHg. FINDINGS  Left Ventricle: Left ventricular ejection fraction, by estimation, is 65 to 70%. The left ventricle has normal function. The left ventricle has no regional wall motion abnormalities. The left ventricular internal cavity size was normal in size. There is  mild concentric left ventricular hypertrophy. Left ventricular diastolic function could not be evaluated due to atrial fibrillation. Left ventricular diastolic function could not  be evaluated. Right Ventricle: The right ventricular size is normal. No increase in right ventricular wall thickness. Right ventricular systolic function is normal. Left Atrium: Left atrial size was mildly dilated. Right Atrium: Right atrial size was normal in size. Pericardium: There is no evidence of pericardial effusion. Mitral Valve: The mitral valve is normal in structure. Trivial mitral valve regurgitation. No evidence of mitral valve stenosis. Tricuspid Valve: The tricuspid valve is normal in structure. Tricuspid valve regurgitation is mild . No evidence of tricuspid stenosis. Aortic Valve: The aortic valve is normal in structure. Aortic valve regurgitation is not visualized. No aortic stenosis is present. Aortic valve peak gradient measures 9.1 mmHg. Pulmonic Valve: The pulmonic valve was normal in structure. Pulmonic valve regurgitation is trivial. No evidence of pulmonic stenosis. Aorta: The aortic root is normal in size and structure. Venous: The inferior vena cava is normal in size with greater than 50% respiratory variability, suggesting right atrial pressure of 3 mmHg. IAS/Shunts: No atrial level shunt detected by color flow Doppler.  LEFT VENTRICLE PLAX 2D LVIDd:         3.70 cm   Diastology LVIDs:         2.50 cm   LV e' medial:    6.85 cm/s LV PW:         1.30 cm   LV E/e' medial:  15.9 LV IVS:        1.10 cm   LV e' lateral:   8.16 cm/s LVOT diam:     1.70 cm   LV E/e' lateral: 13.4 LV SV:         40 LV SV Index:   21 LVOT Area:     2.27 cm  RIGHT VENTRICLE            IVC RV S prime:     8.05 cm/s  IVC diam: 2.00 cm TAPSE (M-mode): 1.0 cm LEFT ATRIUM             Index        RIGHT ATRIUM           Index LA diam:        3.50 cm 1.81 cm/m   RA Area:     14.30 cm LA Vol (A2C):   36.8 ml 19.03 ml/m  RA Volume:   34.00 ml  17.58 ml/m LA Vol (A4C):   47.8 ml 24.72 ml/m LA Biplane Vol: 45.4 ml 23.48 ml/m  AORTIC VALVE AV Area (Vmax): 1.34 cm AV Vmax:        151.00 cm/s AV Peak Grad:   9.1 mmHg  LVOT Vmax:      89.23 cm/s LVOT Vmean:     58.400 cm/s LVOT VTI:       0.178 m  AORTA Ao Root diam: 2.40 cm Ao Asc diam:  2.80 cm MITRAL VALVE                TRICUSPID VALVE MV Area (PHT): 4.15 cm     TR Peak  grad:   36.7 mmHg MV Decel Time: 183 msec     TR Vmax:        303.00 cm/s MR Peak grad: 94.5 mmHg MR Vmax:      486.00 cm/s   SHUNTS MV E velocity: 109.00 cm/s  Systemic VTI:  0.18 m MV A velocity: 37.20 cm/s   Systemic Diam: 1.70 cm MV E/A ratio:  2.93 Arvilla Meres MD Electronically signed by Arvilla Meres MD Signature Date/Time: 02/25/2023/11:20:33 AM    Final    CT Cervical Spine Wo Contrast  Result Date: 02/24/2023 CLINICAL DATA:  82 year old female with syncope, chest tightness, shortness of breath. Pain and nausea. EXAM: CT CERVICAL SPINE WITHOUT CONTRAST TECHNIQUE: Multidetector CT imaging of the cervical spine was performed without intravenous contrast. Multiplanar CT image reconstructions were also generated. RADIATION DOSE REDUCTION: This exam was performed according to the departmental dose-optimization program which includes automated exposure control, adjustment of the mA and/or kV according to patient size and/or use of iterative reconstruction technique. COMPARISON:  Head CT today. FINDINGS: Alignment: Normal cervical lordosis. Cervicothoracic junction alignment is within normal limits. Bilateral posterior element alignment is within normal limits. Skull base and vertebrae: Bone mineralization is within normal limits for age. Visualized skull base is intact. No atlanto-occipital dissociation. C1 and C2 appear intact and aligned. No acute osseous abnormality identified. Soft tissues and spinal canal: No prevertebral fluid or swelling. No visible canal hematoma. Bulky calcified proximal right ICA atherosclerosis, otherwise negative visible noncontrast neck soft tissues. Disc levels: Upper cervical facet arthropathy but otherwise mild for age cervical spine degeneration. Upper chest:  Calcified aortic atherosclerosis. Mild respiratory motion. Visible upper thoracic levels appear grossly intact. IMPRESSION: 1. No acute traumatic injury identified in the cervical spine. 2. Mild for age cervical spine degeneration aside from upper cervical facet arthropathy. 3. Bulky calcified right carotid atherosclerosis. Electronically Signed   By: Odessa Fleming M.D.   On: 02/24/2023 10:44   CT Head Wo Contrast  Result Date: 02/24/2023 CLINICAL DATA:  82 year old female with syncope, chest tightness, shortness of breath. Pain and nausea. EXAM: CT HEAD WITHOUT CONTRAST TECHNIQUE: Contiguous axial images were obtained from the base of the skull through the vertex without intravenous contrast. RADIATION DOSE REDUCTION: This exam was performed according to the departmental dose-optimization program which includes automated exposure control, adjustment of the mA and/or kV according to patient size and/or use of iterative reconstruction technique. COMPARISON:  None Available. FINDINGS: Brain: Occasional small areas of dural calcification (normal variant more pronounced in the right hemisphere. Cerebral volume is within normal limits for age. No midline shift, ventriculomegaly, mass effect, evidence of mass lesion, intracranial hemorrhage or evidence of cortically based acute infarction. Gray-white matter differentiation mostly normal for age. There is minimal to mild left frontal lobe white matter hypodensity. No cortical encephalomalacia. Vascular: Calcified atherosclerosis at the skull base. No suspicious intracranial vascular hyperdensity. Skull: No acute osseous abnormality identified. Sinuses/Orbits: Visualized paranasal sinuses and mastoids are clear. Other: No orbit or scalp soft tissue injury identified. IMPRESSION: 1. No acute intracranial abnormality or acute traumatic injury identified. 2. Largely normal for age; minimal cerebral white matter changes, calcified atherosclerosis. Electronically Signed   By: Odessa Fleming M.D.   On: 02/24/2023 10:42     LOS: 0 days   Lanae Boast, MD [facility]  02/26/2023, 10:15 AM

## 2023-02-27 ENCOUNTER — Ambulatory Visit: Payer: PPO | Admitting: Family

## 2023-02-27 ENCOUNTER — Other Ambulatory Visit (HOSPITAL_COMMUNITY): Payer: Self-pay

## 2023-02-27 DIAGNOSIS — I480 Paroxysmal atrial fibrillation: Secondary | ICD-10-CM | POA: Diagnosis not present

## 2023-02-27 DIAGNOSIS — E1140 Type 2 diabetes mellitus with diabetic neuropathy, unspecified: Secondary | ICD-10-CM

## 2023-02-27 DIAGNOSIS — R55 Syncope and collapse: Secondary | ICD-10-CM | POA: Diagnosis not present

## 2023-02-27 LAB — HEMOGLOBIN A1C
Hgb A1c MFr Bld: 8.9 % — ABNORMAL HIGH (ref 4.8–5.6)
Mean Plasma Glucose: 209 mg/dL

## 2023-02-27 LAB — CBC
HCT: 36.6 % (ref 36.0–46.0)
Hemoglobin: 11.8 g/dL — ABNORMAL LOW (ref 12.0–15.0)
MCH: 27.3 pg (ref 26.0–34.0)
MCHC: 32.2 g/dL (ref 30.0–36.0)
MCV: 84.7 fL (ref 80.0–100.0)
Platelets: 336 10*3/uL (ref 150–400)
RBC: 4.32 MIL/uL (ref 3.87–5.11)
RDW: 14.6 % (ref 11.5–15.5)
WBC: 9.2 10*3/uL (ref 4.0–10.5)
nRBC: 0 % (ref 0.0–0.2)

## 2023-02-27 LAB — GLUCOSE, CAPILLARY
Glucose-Capillary: 223 mg/dL — ABNORMAL HIGH (ref 70–99)
Glucose-Capillary: 160 mg/dL — ABNORMAL HIGH (ref 70–99)

## 2023-02-27 MED ORDER — METOPROLOL SUCCINATE ER 25 MG PO TB24: 25.0000 mg | ORAL_TABLET | Freq: Every day | ORAL | Status: DC

## 2023-02-27 MED ORDER — APIXABAN 5 MG PO TABS
5.0000 mg | ORAL_TABLET | Freq: Two times a day (BID) | ORAL | 0 refills | Status: DC
  Filled 2023-02-27: qty 60, 30d supply, fill #0

## 2023-02-27 MED ORDER — DILTIAZEM HCL ER COATED BEADS 120 MG PO CP24
120.0000 mg | ORAL_CAPSULE | Freq: Every day | ORAL | 0 refills | Status: DC
  Filled 2023-02-27: qty 30, 30d supply, fill #0

## 2023-02-27 MED ORDER — PANTOPRAZOLE SODIUM 40 MG PO TBEC
40.0000 mg | DELAYED_RELEASE_TABLET | Freq: Every day | ORAL | 0 refills | Status: DC
  Filled 2023-02-27: qty 30, 30d supply, fill #0

## 2023-02-27 MED ORDER — SPIRONOLACTONE 25 MG PO TABS
12.5000 mg | ORAL_TABLET | Freq: Every day | ORAL | 0 refills | Status: DC
  Filled 2023-02-27: qty 15, 30d supply, fill #0

## 2023-02-27 MED ORDER — PANTOPRAZOLE SODIUM 40 MG PO TBEC
40.0000 mg | DELAYED_RELEASE_TABLET | Freq: Every day | ORAL | Status: DC
  Administered 2023-02-27: 40 mg via ORAL
  Filled 2023-02-27: qty 1

## 2023-02-27 MED ORDER — SPIRONOLACTONE 12.5 MG HALF TABLET: 12.5000 mg | ORAL_TABLET | Freq: Every day | ORAL | Status: DC

## 2023-02-27 MED ORDER — METOPROLOL SUCCINATE ER 25 MG PO TB24
25.0000 mg | ORAL_TABLET | Freq: Every day | ORAL | 0 refills | Status: DC
  Filled 2023-02-27: qty 30, 30d supply, fill #0

## 2023-02-27 NOTE — Progress Notes (Addendum)
Rounding Note    Patient Name: Rachel [name] Date of Encounter: 02/27/2023  [facility] Cardiologist: Chilton Si, MD   Subjective   Pt was unaware of her rhythm change. She feels better this morning, but has not walked in the hall yet.  Inpatient Medications    Scheduled Meds:  apixaban  5 mg Oral BID   atorvastatin  20 mg Oral Daily   budesonide (PULMICORT) nebulizer solution  0.25 mg Nebulization BID   diltiazem  120 mg Oral Daily   irbesartan  75 mg Oral Daily   And   hydrochlorothiazide  25 mg Oral Daily   insulin aspart  0-15 Units Subcutaneous TID WC   loratadine  10 mg Oral Daily   metoprolol succinate  50 mg Oral Daily   sodium chloride flush  3 mL Intravenous Q12H   sodium chloride flush  3 mL Intravenous Q12H   spironolactone  25 mg Oral Daily   Continuous Infusions:  sodium chloride     PRN Meds: sodium chloride, acetaminophen, alum & mag hydroxide-simeth, ipratropium, nitroGLYCERIN, ondansetron (ZOFRAN) IV, sodium chloride flush   Vital Signs    Vitals:   02/26/23 1703 02/26/23 2006 02/26/23 2018 02/27/23 0520  BP: (!) 124/53  (!) 121/58 (!) 120/54  Pulse: 62  82 (!) 59  Resp: 18  20 18   Temp: 98 F (36.7 C)  97.8 F (36.6 C) 97.8 F (36.6 C)  TempSrc: Oral  Oral Oral  SpO2:  97% 100% 98%  Weight:      Height:        Intake/Output Summary (Last 24 hours) at 02/27/2023 0711 Last data filed at 02/26/2023 1525 Gross per 24 hour  Intake 25.45 ml  Output --  Net 25.45 ml      02/25/2023   11:39 AM 02/24/2023    9:01 AM 01/17/2023    8:15 AM  Last 3 Weights  Weight (lbs) 200 lb 9.9 oz 200 lb 200 lb 9.6 oz  Weight (kg) 91 kg 90.719 kg 90.992 kg      Telemetry    Converted to sinus bradycardia at 0141 with less than 2 sec post conversion pause, now in sinus to sinus bradycardia - Personally Reviewed  ECG    Will order repeat - Personally Reviewed  Physical Exam   GEN: No acute distress.   Neck: No JVD Cardiac:  RRR, no murmurs, rubs, or gallops.  Respiratory: Clear to auscultation bilaterally. GI: Soft, nontender, non-distended  MS: No edema; No deformity. Neuro:  Nonfocal  Psych: Normal affect   Labs    High Sensitivity Troponin:   Recent Labs  Lab 02/24/23 0909 02/24/23 1236 02/24/23 1944  TROPONINIHS 127* 159* 217*     Chemistry Recent Labs  Lab 02/24/23 0909  NA 135  K 4.8  CL 106  CO2 20*  GLUCOSE 221*  BUN 32*  CREATININE 1.20*  CALCIUM 9.0  MG 1.9  GFRNONAA 46*  ANIONGAP 9    Lipids No results for input(s): "CHOL", "TRIG", "HDL", "LABVLDL", "LDLCALC", "CHOLHDL" in the last 168 hours.  Hematology Recent Labs  Lab 02/25/23 0446 02/26/23 0446 02/27/23 0326  WBC 9.4 9.2 9.2  RBC 4.32 4.20 4.32  HGB 11.7* 11.7* 11.8*  HCT 37.4 35.6* 36.6  MCV 86.6 84.8 84.7  MCH 27.1 27.9 27.3  MCHC 31.3 32.9 32.2  RDW 14.7 14.9 14.6  PLT 318 335 336   Thyroid  Recent Labs  Lab 02/24/23 1236  TSH 1.996  BNPNo results for input(s): "BNP", "PROBNP" in the last 168 hours.  DDimer  Recent Labs  Lab 02/24/23 1544  DDIMER 0.82*     Radiology    CARDIAC CATHETERIZATION  Result Date: 02/25/2023   Prox RCA lesion is 20% stenosed.   Ost Cx to Prox Cx lesion is 20% stenosed.   Ost LAD to Prox LAD lesion is 20% stenosed.   Mid LAD lesion is 20% stenosed. 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 Her troponin elevation is likely due to demand ischemia in the setting of atrial fib with RVR.   ECHOCARDIOGRAM COMPLETE  Result Date: 02/25/2023    ECHOCARDIOGRAM REPORT   Patient Name:   Rachel [name] [facility] Date of Exam: 02/25/2023 Medical Rec #:  [id number]                  Height:       63.0 in Accession #:    [id number]                 Weight:       200.0 lb Date of Birth:  01/04/1941                 BSA:          1.934 m Patient Age:    82 years                   BP:           128/70 mmHg Patient Gender: F                           HR:           74 bpm. Exam Location:  Inpatient Procedure: 2D Echo, Cardiac Doppler and Color Doppler Indications:    CHF-Acute Diastolic I50.31  History:        Patient has prior history of Echocardiogram examinations, most                 recent 01/09/2018. CHF and Cardiomegaly, Arrythmias:Atrial                 Fibrillation, Signs/Symptoms:Shortness of Breath and Syncope;                 Risk Factors:Hypertension, Diabetes and Dyslipidemia.  Sonographer:    Lucendia Herrlich Referring Phys: [id number] PING T ZHANG IMPRESSIONS  1. Left ventricular ejection fraction, by estimation, is 65 to 70%. The left ventricle has normal function. The left ventricle has no regional wall motion abnormalities. There is mild concentric left ventricular hypertrophy. Left ventricular diastolic function could not be evaluated.  2. Right ventricular systolic function is normal. The right ventricular size is normal.  3. Left atrial size was mildly dilated.  4. The mitral valve is normal in structure. Trivial mitral valve regurgitation. No evidence of mitral stenosis.  5. The aortic valve is normal in structure. Aortic valve regurgitation is not visualized. No aortic stenosis is present.  6. The inferior vena cava is normal in size with greater than 50% respiratory variability, suggesting right atrial pressure of 3 mmHg. FINDINGS  Left Ventricle: Left ventricular ejection fraction, by estimation, is 65 to 70%. The left ventricle has normal function. The left ventricle has no regional wall motion abnormalities. The left ventricular internal cavity size was normal in size. There is  mild concentric left ventricular hypertrophy. Left ventricular diastolic function could not be  evaluated due to atrial fibrillation. Left ventricular diastolic function could not be evaluated. Right Ventricle: The right ventricular size is normal. No increase in right ventricular wall thickness. Right ventricular systolic function is normal. Left Atrium: Left  atrial size was mildly dilated. Right Atrium: Right atrial size was normal in size. Pericardium: There is no evidence of pericardial effusion. Mitral Valve: The mitral valve is normal in structure. Trivial mitral valve regurgitation. No evidence of mitral valve stenosis. Tricuspid Valve: The tricuspid valve is normal in structure. Tricuspid valve regurgitation is mild . No evidence of tricuspid stenosis. Aortic Valve: The aortic valve is normal in structure. Aortic valve regurgitation is not visualized. No aortic stenosis is present. Aortic valve peak gradient measures 9.1 mmHg. Pulmonic Valve: The pulmonic valve was normal in structure. Pulmonic valve regurgitation is trivial. No evidence of pulmonic stenosis. Aorta: The aortic root is normal in size and structure. Venous: The inferior vena cava is normal in size with greater than 50% respiratory variability, suggesting right atrial pressure of 3 mmHg. IAS/Shunts: No atrial level shunt detected by color flow Doppler.  LEFT VENTRICLE PLAX 2D LVIDd:         3.70 cm   Diastology LVIDs:         2.50 cm   LV e' medial:    6.85 cm/s LV PW:         1.30 cm   LV E/e' medial:  15.9 LV IVS:        1.10 cm   LV e' lateral:   8.16 cm/s LVOT diam:     1.70 cm   LV E/e' lateral: 13.4 LV SV:         40 LV SV Index:   21 LVOT Area:     2.27 cm  RIGHT VENTRICLE            IVC RV S prime:     8.05 cm/s  IVC diam: 2.00 cm TAPSE (M-mode): 1.0 cm LEFT ATRIUM             Index        RIGHT ATRIUM           Index LA diam:        3.50 cm 1.81 cm/m   RA Area:     14.30 cm LA Vol (A2C):   36.8 ml 19.03 ml/m  RA Volume:   34.00 ml  17.58 ml/m LA Vol (A4C):   47.8 ml 24.72 ml/m LA Biplane Vol: 45.4 ml 23.48 ml/m  AORTIC VALVE AV Area (Vmax): 1.34 cm AV Vmax:        151.00 cm/s AV Peak Grad:   9.1 mmHg LVOT Vmax:      89.23 cm/s LVOT Vmean:     58.400 cm/s LVOT VTI:       0.178 m  AORTA Ao Root diam: 2.40 cm Ao Asc diam:  2.80 cm MITRAL VALVE                TRICUSPID VALVE MV Area  (PHT): 4.15 cm     TR Peak grad:   36.7 mmHg MV Decel Time: 183 msec     TR Vmax:        303.00 cm/s MR Peak grad: 94.5 mmHg MR Vmax:      486.00 cm/s   SHUNTS MV E velocity: 109.00 cm/s  Systemic VTI:  0.18 m MV A velocity: 37.20 cm/s   Systemic Diam: 1.70 cm MV E/A ratio:  2.93 Arvilla Meres MD  Electronically signed by Arvilla Meres MD Signature Date/Time: 02/25/2023/11:20:33 AM    Final     Cardiac Studies   Echo 02/25/23: 1. Left ventricular ejection fraction, by estimation, is 65 to 70%. The  left ventricle has normal function. The left ventricle has no regional  wall motion abnormalities. There is mild concentric left ventricular  hypertrophy. Left ventricular diastolic  function could not be evaluated.   2. Right ventricular systolic function is normal. The right ventricular  size is normal.   3. Left atrial size was mildly dilated.   4. The mitral valve is normal in structure. Trivial mitral valve  regurgitation. No evidence of mitral stenosis.   5. The aortic valve is normal in structure. Aortic valve regurgitation is  not visualized. No aortic stenosis is present.   6. The inferior vena cava is normal in size with greater than 50%  respiratory variability, suggesting right atrial pressure of 3 mmHg.      LHC 02/25/23:   Prox RCA lesion is 20% stenosed.   Ost Cx to Prox Cx lesion is 20% stenosed.   Ost LAD to Prox LAD lesion is 20% stenosed.   Mid LAD lesion is 20% stenosed.   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 Her troponin elevation is likely due to demand ischemia in the setting of atrial fib with RVR.   Patient Profile     82 y.o. female  with hx of HTN, Asthma, DM, HFpEF followed in advanced HTN clinic admitted 02/24/23 with epigastric burning, syncope after standing and new atrial fib.   Assessment & Plan    Atrial fibrillation - new diagnosis this admission - cardizem gtt with rate control, no pauses  greater than 3 sec - transitioned to PO cardizem 120 mg - consolidated BB to toprol 50 mg - converted to sinus bradycardia overnight at 0141 with less than 2 sec post conversion pause, now in sinus to sinus bradycardia - she needs to walk in the hall - I will reduce toprol to 25 mg at night, continue cardizem 120 mg   Hypertension - also due for 25 mg spironolactone, 75 mg irbesartan, and 25 mg HCTZ - irbesartan reduced from 300 mg to 75 mg after receiving full dose yesterday, which she tolerated - suspect she can resume home valsartan-HCTZ  - would continue to hold 2.5 mg amlodipine for now - I have also reduced spironolactone to 12.5 mg   Chronic anticoagulation - heparin transitioned to eliquis 5 mg BID - Hb stable   CAD Elevated troponin Epigastric pain - HS troponin 127 --> 159 --> 217 - heart cath with only mild nonobstructive disease - continue risk factor modification for ild CAD and family history of early CAD - D/C'ed ASA in the setting of eliquis - consider GI workup for epigastric pain felt noncardiac given cath   Syncope CT head negative - no driving for 6 months   Cardiology follow up has been arranged.      For questions or updates, please contact [facility] Please consult [email or web address] for contact info under        Signed, Marcelino Duster, PA  02/27/2023, 7:11 AM     Attending Note:   The patient was seen and examined.  Agree with assessment and plan as noted above.  Changes made to the above note as needed.  Patient seen and independently examined with Bettina Gavia, PA .   We discussed all aspects  of the encounter. I agree with the assessment and plan as stated above.     Syncope:  no clear explanation for her syncope.   Follow up with Dr. Duke Salvia.  She can consider other work up ( possible implantable loop recorder )   2.  PAF :  has converted back to nSR.  Had a brief post conversion pause but not over 2 sec.   Her syncope is  unlikely to have been from a post conversion pause   3.  Elevated trops:  cath showed nonobstructive cad   She appears stable for DC today    I have spent a total of 40 minutes with patient reviewing hospital  notes , telemetry, EKGs, labs and examining patient as well as establishing an assessment and plan that was discussed with the patient.  > 50% of time was spent in direct patient care.    Vesta Mixer, Montez Hageman., MD, [facility] 02/27/2023, 12:02 PM [address]  [address] Office [phone] Pager [phone]

## 2023-02-27 NOTE — Inpatient Diabetes Management (Signed)
Inpatient Diabetes Program Recommendations  AACE/ADA: New Consensus Statement on Inpatient Glycemic Control (2015)  Target Ranges:  Prepandial:   less than 140 mg/dL      Peak postprandial:   less than 180 mg/dL (1-2 hours)      Critically ill patients:  140 - 180 mg/dL   Lab Results  Component Value Date   GLUCAP 223 (H) 02/27/2023   HGBA1C 8.9 (H) 02/25/2023    Review of Glycemic Control  Diabetes history: DM 2 Outpatient Diabetes medications: none (intolerant of glimepiride, prnadin and metformin per PCP notes)  Current orders for Inpatient glycemic control:  Novolog 0-15 units tid  A1c 8.9% on 6/3  Inpatient Diabetes Program Recommendations:    -  Consider Novolog 3 units tid meal coverage if eating >50% of meals -  Add Novolog hs scale  Will need another oral agent at time of d/c and close follow up with PCP.  Thanks,  Christena Deem RN, MSN, BC-ADM Inpatient Diabetes Coordinator Team Pager [phone] (8a-5p)

## 2023-02-27 NOTE — Plan of Care (Signed)
  Problem: Education: Goal: Understanding of cardiac disease, CV risk reduction, and recovery process will improve Outcome: Progressing Goal: Individualized Educational Video(s) Outcome: Progressing   Problem: Activity: Goal: Ability to tolerate increased activity will improve Outcome: Progressing   Problem: Cardiac: Goal: Ability to achieve and maintain adequate cardiovascular perfusion will improve Outcome: Progressing   Problem: Health Behavior/Discharge Planning: Goal: Ability to safely manage health-related needs after discharge will improve Outcome: Progressing   Problem: Education: Goal: Ability to describe self-care measures that may prevent or decrease complications (Diabetes Survival Skills Education) will improve Outcome: Progressing Goal: Individualized Educational Video(s) Outcome: Progressing   Problem: Coping: Goal: Ability to adjust to condition or change in health will improve Outcome: Progressing   Problem: Fluid Volume: Goal: Ability to maintain a balanced intake and output will improve Outcome: Progressing   Problem: Health Behavior/Discharge Planning: Goal: Ability to identify and utilize available resources and services will improve Outcome: Progressing Goal: Ability to manage health-related needs will improve Outcome: Progressing   Problem: Metabolic: Goal: Ability to maintain appropriate glucose levels will improve Outcome: Progressing   Problem: Nutritional: Goal: Maintenance of adequate nutrition will improve Outcome: Progressing Goal: Progress toward achieving an optimal weight will improve Outcome: Progressing   Problem: Skin Integrity: Goal: Risk for impaired skin integrity will decrease Outcome: Progressing   Problem: Tissue Perfusion: Goal: Adequacy of tissue perfusion will improve Outcome: Progressing   Problem: Education: Goal: Knowledge of General Education information will improve Description: Including pain rating scale,  medication(s)/side effects and non-pharmacologic comfort measures Outcome: Progressing   Problem: Health Behavior/Discharge Planning: Goal: Ability to manage health-related needs will improve Outcome: Progressing   Problem: Clinical Measurements: Goal: Ability to maintain clinical measurements within normal limits will improve Outcome: Progressing Goal: Will remain free from infection Outcome: Progressing Goal: Diagnostic test results will improve Outcome: Progressing Goal: Respiratory complications will improve Outcome: Progressing Goal: Cardiovascular complication will be avoided Outcome: Progressing   Problem: Activity: Goal: Risk for activity intolerance will decrease Outcome: Progressing   Problem: Nutrition: Goal: Adequate nutrition will be maintained Outcome: Progressing   Problem: Coping: Goal: Level of anxiety will decrease Outcome: Progressing   Problem: Elimination: Goal: Will not experience complications related to bowel motility Outcome: Progressing Goal: Will not experience complications related to urinary retention Outcome: Progressing   Problem: Pain Managment: Goal: General experience of comfort will improve Outcome: Progressing   Problem: Safety: Goal: Ability to remain free from injury will improve Outcome: Progressing   Problem: Skin Integrity: Goal: Risk for impaired skin integrity will decrease Outcome: Progressing   Problem: Education: Goal: Understanding of CV disease, CV risk reduction, and recovery process will improve Outcome: Progressing Goal: Individualized Educational Video(s) Outcome: Progressing   Problem: Activity: Goal: Ability to return to baseline activity level will improve Outcome: Progressing   Problem: Cardiovascular: Goal: Ability to achieve and maintain adequate cardiovascular perfusion will improve Outcome: Progressing Goal: Vascular access site(s) Level 0-1 will be maintained Outcome: Progressing   Problem:  Health Behavior/Discharge Planning: Goal: Ability to safely manage health-related needs after discharge will improve Outcome: Progressing   

## 2023-02-27 NOTE — Progress Notes (Signed)
Physical Therapy Treatment Patient Details Name: Rachel [name] MRN: [id number] DOB: 05-07-1941 Today's Date: 02/27/2023   History of Present Illness Pt is 82 year old presented to [facility] on  02/24/23 for syncopal episode and chest pain. Pt with new onset afib. Pt for heart cath 6/3.  PMH - HTN, OA, chf, asthma    PT Comments    Pt remains to have c/o lightheaded/swimmyheadedness when she takes the new medicine. Due to L knee pain and lightheadedness pt benefits from rollator during ambulation as pt reaches for PT and hallway rail when not using rollator and demos antalgic gait pattern. Pt was able to complete necessary stair negotiation to access home. Family present in room and states they will be taking shifts and providing 24/7 supervision to patient until she feels better. Acute PT to cont to follow.    Recommendations for follow up therapy are one component of a multi-disciplinary discharge planning process, led by the attending physician.  Recommendations may be updated based on patient status, additional functional criteria and insurance authorization.  Follow Up Recommendations       Assistance Recommended at Discharge Intermittent Supervision/Assistance  Patient can return home with the following Help with stairs or ramp for entrance;Assist for transportation;Assistance with cooking/housework   Equipment Recommendations  Rollator (4 wheels)    Recommendations for Other Services       Precautions / Restrictions Precautions Precautions: Other (comment) Precaution Comments: light headed from new pills and amount of pills Restrictions Weight Bearing Restrictions: No     Mobility  Bed Mobility Overal bed mobility: Needs Assistance Bed Mobility: Supine to Sit     Supine to sit: Min assist Sit to supine: Modified independent (Device/Increase time)   General bed mobility comments: granddaughter assisted pt with trunk with HOB elevated    Transfers Overall  transfer level: Needs assistance Equipment used: Rollator (4 wheels), None Transfers: Sit to/from Stand Sit to Stand: Min guard           General transfer comment: verbal cues for safe hand placement, how lock and unlock rollator    Ambulation/Gait Ambulation/Gait assistance: Min guard Gait Distance (Feet): 200 Feet Assistive device: Rollator (4 wheels) Gait Pattern/deviations: Step-through pattern, Decreased stride length Gait velocity: decr Gait velocity interpretation: 1.31 - 2.62 ft/sec, indicative of limited community ambulator   General Gait Details: Slow gait. Used rollator for safety due to reports of dizziness/lightheadedness   Stairs Stairs: Yes Stairs assistance: Min assist Stair Management: One rail Left, Step to pattern, Forwards (with R HHA) Number of Stairs: 8 General stair comments: pt descending with step to pattern due to arthritic pain in L knee, was able to ascend reciprocally with R HR and L HHA   Wheelchair Mobility    Modified Rankin (Stroke Patients Only)       Balance Overall balance assessment: Mild deficits observed, not formally tested                                          Cognition Arousal/Alertness: Awake/alert Behavior During Therapy: WFL for tasks assessed/performed Overall Cognitive Status: Within Functional Limits for tasks assessed                                          Exercises  General Comments General comments (skin integrity, edema, etc.): BP 130/84, c/o lightheadedness      Pertinent Vitals/Pain Pain Assessment Pain Assessment: No/denies pain    Home Living                          Prior Function            PT Goals (current goals can now be found in the care plan section) Acute Rehab PT Goals Patient Stated Goal: return home and to work PT Goal Formulation: With patient Time For Goal Achievement: 03/11/23 Potential to Achieve Goals: Good Progress  towards PT goals: Progressing toward goals    Frequency    Min 1X/week      PT Plan Current plan remains appropriate    Co-evaluation              AM-PAC PT "6 Clicks" Mobility   Outcome Measure  Help needed turning from your back to your side while in a flat bed without using bedrails?: None Help needed moving from lying on your back to sitting on the side of a flat bed without using bedrails?: A Little Help needed moving to and from a bed to a chair (including a wheelchair)?: A Little Help needed standing up from a chair using your arms (e.g., wheelchair or bedside chair)?: A Little Help needed to walk in hospital room?: A Little Help needed climbing 3-5 steps with a railing? : A Little 6 Click Score: 19    End of Session Equipment Utilized During Treatment: Gait belt Activity Tolerance: Patient tolerated treatment well Patient left: with call bell/phone within reach;in bed;with family/visitor present Nurse Communication: Mobility status PT Visit Diagnosis: Other abnormalities of gait and mobility (R26.89);Muscle weakness (generalized) (M62.81)     Time: 1610-9604 PT Time Calculation (min) (ACUTE ONLY): 21 min  Charges:  $Gait Training: 8-22 mins                     Lewis Shock, PT, DPT Acute Rehabilitation Services Secure chat preferred Office #: [phone]    Iona Hansen 02/27/2023, 10:33 AM

## 2023-02-27 NOTE — Care Management (Signed)
02-27-23 Patient is in need of rollator for home. Case Manager discussed agency with the patient and ordered DME from [employer]. [employer] to deliver the rollator today. No further needs identified at this time.

## 2023-02-27 NOTE — Inpatient Diabetes Management (Signed)
Inpatient Diabetes Program Recommendations  AACE/ADA: New Consensus Statement on Inpatient Glycemic Control (2015)  Target Ranges:  Prepandial:   less than 140 mg/dL      Peak postprandial:   less than 180 mg/dL (1-2 hours)      Critically ill patients:  140 - 180 mg/dL   Lab Results  Component Value Date   GLUCAP 160 (H) 02/27/2023   HGBA1C 8.9 (H) 02/25/2023    Review of Glycemic Control  Diabetes history: DM 2 Outpatient Diabetes medications: Prandin 4 mg tid before meals although pt reports intolerance she reports consistently taking it.   Spoke with pt at beside regarding A1c of 8.9%. encouraged pt follow up with glucose trends. Pt reports being placed on prednisone for 3 days last Thursday prior to coming into the hospital. Informed pt of A1c and reviewed glucose and A1c goals.  Thanks, Christena Deem RN, MSN, BC-ADM Inpatient Diabetes Coordinator Team Pager [phone] (8a-5p)

## 2023-02-27 NOTE — Progress Notes (Signed)
Mobility Specialist Progress Note:   02/27/23 1350  Mobility  Activity Ambulated with assistance in hallway  Level of Assistance Standby assist, set-up cues, supervision of patient - no hands on  Assistive Device Front wheel walker  Distance Ambulated (ft) 250 ft  Activity Response Tolerated well  Mobility Referral Yes  $Mobility charge 1 Mobility  Mobility Specialist Start Time (ACUTE ONLY) 1350  Mobility Specialist Stop Time (ACUTE ONLY) 1402  Mobility Specialist Time Calculation (min) (ACUTE ONLY) 12 min   Pt agreeable to mobility session. Required no physical assistance throughout. Pt asx during session. Back in bed with all needs met.   Addison Lank Mobility Specialist Please contact via SecureChat or  Rehab office at [phone]

## 2023-02-27 NOTE — Discharge Summary (Signed)
Physician Discharge Summary  Rachel [name] WUX:[id number] DOB: 01/02/1941 DOA: 02/24/2023  PCP: Rema Fendt, NP  Admit date: 02/24/2023 Discharge date: 02/27/2023 Recommendations for Outpatient Follow-up:  Follow up with PCP in 1 weeks-call for appointment Follow-up with cardiology as outpatient Please obtain BMP/CBC in one week  Discharge Dispo: home Discharge Condition: Stable Code Status:   Code Status: Full Code Diet recommendation:  Diet Order             Diet Heart Room service appropriate? Yes; Fluid consistency: Thin  Diet effective now                    Brief/Interim Summary: 82 y.o.f w/ moderate asthma, HTN, chronic HFpEF, HLD, IIDM, came with palpitation, chest pain and syncope episode . She has a poorly controlled asthma,  and has severe seasonal allergy.  Last Wednesday she started to have runny nose and dry cough and wheezing and she went to see urgent care on Friday and was prescribed with prednisone.  Recently, her blood pressure has been poorly controlled she went to see her cardiologist about 1 month ago who increased her blood pressure medication- cardiology added combined pill of Diovan/HCTZ.  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 She fell forward and hit her right-sided rib cage but no head injury.  She estimated only LOC for a few seconds, no tongue biting or loss control of urine or bowel movement and no postictal confusion.She described the brief episode of chest pain this burning sensation nonradiating 3-4/10 and dissipated after strict return her consciousness.    ED Course: was in rapid A-fib, BP140/100.  CXR-negative for acute findings.  CT head and neck: negative for acute intracranial abnormalities or dislocation or fracture.Blood work showed K4.5, creatinine 1.2, glucose 220. Patient was admitted for New onset  of A-fib and chest pain with troponin elevation probably demanding ischemia, cardiology was consulted placed on heparin drip and admitted. Underwent cardiac cath 6/3 no significant CAD. Echo showed EF 55 to 70%, no RWMA, normal right ventricular systolic function, no mitral stenosis aortic valve no aortic stenosis. overall patient is clinically improving on aspirin statin metoprolol Aldactone, Heparin drip for a fib Patient followed by cardiology closely due to elevated troponin and epigastric pain underwent cardiac cath with mild nonobstructive disease advise risk factor modification, holding aspirin in the setting of Eliquis use. Patient remains in A-fib but rate controlled no pauses transition to oral Cardizem, Lopressor and on Eliquis.  Toprol dose decreased to 25 mg at night Cardizem at 120 mg> she converted to sinus rhythm overnight and now in sinus bradycardia. remains hemodynamically stable.  Patient will follow-up with Dr. Duke Salvia to consider further workup possible implantable loop recorder.   Discharge Diagnoses:  Principal Problem:   Afib (HCC) Active Problems:   Acute bronchitis   A-fib (HCC)   Syncope, vasovagal   New onset A-fib with RVR new diagnosis placed on heparin drip due to high CHA2DS2-VASc score of 6, started on metoprolol> dose increased to 50 mg bid 6/4- Echo showed EF 55 to 70%, no RWMA, normal right ventricular systolic function, no mitral stenosis aortic valve no aortic stenosis. Remained in A-fib but rate controlled no pauses transition to oral Cardizem, Lopressor and on Eliquis.  Toprol dose decreased to 25 mg at night Cardizem at 120 mg> she converted to sinus rhythm overnight and now in  sinus bradycardia.  Cleared by cardiology for discharge home today.  Elevated troponin and epigastric pain unclear etiology GI versus demand ischemia. Underwent cardiac cath 6/3 no significant CAD. Recent Labs    02/24/23 1944  TROPONINIHS 217*    Episode of syncope: LVOT  gradient on prior echo: could be contributing to syncope??  Follow-up echo stable cont ptot.  To consider outpatient workup possible loop recorder  Moderate asthma:, Continue Pulmicort and bronchodilator, not needing oxygen.  Hypertension Chronic HFpEF: BP stable. volume status stable continue current medication   DM: Blood sugar stable at this time continue home meds-follow-up with PCP Recent Labs  Lab 02/25/23 0446 02/25/23 0521 02/26/23 1329 02/26/23 1723 02/26/23 2129 02/27/23 0812 02/27/23 1114  GLUCAP  --    < > 218* 136* 219* 223* 160*  HGBA1C 8.9*  --   --   --   --   --   --    < > = values in this interval not displayed.    Class I Obesity:Patient's Body mass index is 35.54 kg/m. : Will benefit with PCP follow-up, weight loss  healthy lifestyle and outpatient sleep evaluation.  Consults: Cardiology Subjective: [place] oriented resting comfortably no complaints  Discharge Exam: Vitals:   02/27/23 0900 02/27/23 1115  BP: 123/67 (!) 115/45  Pulse: 90 (!) 56  Resp:  18  Temp:  (!) 97.5 F (36.4 C)  SpO2:  99%   General: Pt is alert, awake, not in acute distress Cardiovascular: RRR, S1/S2 +, no rubs, no gallops Respiratory: CTA bilaterally, no wheezing, no rhonchi Abdominal: Soft, NT, ND, bowel sounds + Extremities: no edema, no cyanosis  Discharge Instructions  Discharge Instructions     Discharge instructions   Complete by: As directed    Please call call MD or return to ER for similar or worsening recurring problem that brought you to hospital or if any fever,nausea/vomiting,abdominal pain, uncontrolled pain, chest pain,  shortness of breath or any other alarming symptoms.  Please follow-up your doctor as instructed in a week time and call the office for appointment.  Please avoid alcohol, smoking, or any other illicit substance and maintain healthy habits including taking your regular medications as prescribed.  You were cared for by a hospitalist  during your hospital stay. If you have any questions about your discharge medications or the care you received while you were in the hospital after you are discharged, you can call the unit and ask to speak with the hospitalist on call if the hospitalist that took care of you is not available.  Once you are discharged, your primary care physician will handle any further medical issues. Please note that NO REFILLS for any discharge medications will be authorized once you are discharged, as it is imperative that you return to your primary care physician (or establish a relationship with a primary care physician if you do not have one) for your aftercare needs so that they can reassess your need for medications and monitor your lab values   Increase activity slowly   Complete by: As directed       Allergies as of 02/27/2023       Reactions   Aspirin Nausea And Vomiting, Other (See Comments)   Uncoated version = "Jittery and GI upset" (has tolerated the chewable version)   Atorvastatin Other (See Comments)   Caused aching in the body   Codeine Sulfate Nausea And Vomiting, Other (See Comments)   Heart races also   Glimepiride Diarrhea  Metformin Diarrhea   Prandin [repaglinide] Other (See Comments)   "it upset my stomach"   Propoxyphene N-acetaminophen Nausea And Vomiting        Medication List     STOP taking these medications    amLODipine 2.5 MG tablet Commonly known as: NORVASC   predniSONE 20 MG tablet Commonly known as: DELTASONE       TAKE these medications    acetaminophen 500 MG tablet Commonly known as: TYLENOL Take 500 mg by mouth every 8 (eight) hours as needed for mild pain or headache.   albuterol 108 (90 Base) MCG/ACT inhaler Commonly known as: VENTOLIN HFA INHALE 1-2 PUFFS BY MOUTH EVERY 6 HOURS AS NEEDED FOR WHEEZE OR SHORTNESS OF BREATH What changed: See the new instructions.   apixaban 5 MG Tabs tablet Commonly known as: ELIQUIS Take 1 tablet (5 mg  total) by mouth 2 (two) times daily.   atorvastatin 20 MG tablet Commonly known as: LIPITOR Take 1 tablet (20 mg total) by mouth daily.   budesonide 0.25 MG/2ML nebulizer solution Commonly known as: PULMICORT Take 2 mLs (0.25 mg total) by nebulization 2 (two) times daily.   cetirizine 5 MG tablet Commonly known as: ZYRTEC Take 1 tablet (5 mg total) by mouth daily.   diclofenac Sodium 1 % Gel Commonly known as: VOLTAREN Apply 2 g topically 4 (four) times daily as needed.   diltiazem 120 MG 24 hr capsule Commonly known as: CARDIZEM CD Take 1 capsule (120 mg total) by mouth daily. Start taking on: February 28, 2023   fluticasone 50 MCG/ACT nasal spray Commonly known as: FLONASE Place 2 sprays into both nostrils daily as needed for allergies or rhinitis.   Hair Skin and Nails Formula Tabs Take 1 tablet by mouth daily.   ipratropium 17 MCG/ACT inhaler Commonly known as: ATROVENT HFA Inhale 1 puff into the lungs 3 (three) times daily.   loratadine 10 MG tablet Commonly known as: CLARITIN Take 1 tablet (10 mg total) by mouth daily as needed for allergies. What changed: when to take this   metoprolol succinate 25 MG 24 hr tablet Commonly known as: TOPROL-XL Take 1 tablet (25 mg total) by mouth at bedtime.   nystatin powder Commonly known as: MYCOSTATIN/NYSTOP Apply topically 4 (four) times daily. What changed:  when to take this reasons to take this   nystatin cream Commonly known as: MYCOSTATIN Apply 1 application topically 2 (two) times daily. What changed:  when to take this reasons to take this   OneTouch Delica Lancets 30G Misc E11.9   OneTouch Verio Reflect w/Device Kit 1 each by Does not apply route daily. E11.9   OneTouch Verio test strip Generic drug: glucose blood Use to check blood sugars once a day   ONETOUCH VERIO VI 1 each by Other route daily. E11.9   pantoprazole 40 MG tablet Commonly known as: PROTONIX Take 1 tablet (40 mg total) by mouth  daily. Start taking on: February 28, 2023   spironolactone 25 MG tablet Commonly known as: ALDACTONE Take 0.5 tablets (12.5 mg total) by mouth daily. Start taking on: February 28, 2023 What changed: how much to take   valsartan-hydrochlorothiazide 320-25 MG tablet Commonly known as: DIOVAN-HCT Take 1 tablet by mouth daily.        Follow-up Information     [facility] Follow up.   Why: [employer]- to be delivered to the room Contact information: Located in: [employer] Address: [address], [place], [place] [address] Phone: [phone]  Rema Fendt, NP Follow up.   Specialty: Nurse Practitioner Contact information: [address] [place] [address] [phone]                Allergies  Allergen Reactions   Aspirin Nausea And Vomiting and Other (See Comments)    Uncoated version = "Jittery and GI upset" (has tolerated the chewable version)   Atorvastatin Other (See Comments)    Caused aching in the body   Codeine Sulfate Nausea And Vomiting and Other (See Comments)    Heart races also   Glimepiride Diarrhea   Metformin Diarrhea   Prandin [Repaglinide] Other (See Comments)    "it upset my stomach"   Propoxyphene N-Acetaminophen Nausea And Vomiting    The results of significant diagnostics from this hospitalization (including imaging, microbiology, ancillary and laboratory) are listed below for reference.    Microbiology: No results found for this or any previous visit (from the past 240 hour(s)).  Procedures/Studies: CARDIAC CATHETERIZATION  Result Date: 02/25/2023   Prox RCA lesion is 20% stenosed.   Ost Cx to Prox Cx lesion is 20% stenosed.   Ost LAD to Prox LAD lesion is 20% stenosed.   Mid LAD lesion is 20% stenosed. 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 Her troponin elevation is likely due to demand ischemia in the setting of atrial fib with RVR.   ECHOCARDIOGRAM  COMPLETE  Result Date: 02/25/2023    ECHOCARDIOGRAM REPORT   Patient Name:   Rachel [name] [facility] Date of Exam: 02/25/2023 Medical Rec #:  [id number]                  Height:       63.0 in Accession #:    [id number]                 Weight:       200.0 lb Date of Birth:  [birth year]                 BSA:          1.934 m Patient Age:    81 years                   BP:           128/70 mmHg Patient Gender: F                          HR:           74 bpm. Exam Location:  Inpatient Procedure: 2D Echo, Cardiac Doppler and Color Doppler Indications:    CHF-Acute Diastolic I50.31  History:        Patient has prior history of Echocardiogram examinations, most                 recent 01/09/2018. CHF and Cardiomegaly, Arrythmias:Atrial                 Fibrillation, Signs/Symptoms:Shortness of Breath and Syncope;                 Risk Factors:Hypertension, Diabetes and Dyslipidemia.  Sonographer:    Lucendia Herrlich Referring Phys: [id number] PING T ZHANG IMPRESSIONS  1. Left ventricular ejection fraction, by estimation, is 65 to 70%. The left ventricle has normal function. The left ventricle has no regional wall motion abnormalities. There is mild concentric left ventricular hypertrophy. Left ventricular diastolic function could not be evaluated.  2. Right ventricular systolic function is normal. The right ventricular size is normal.  3. Left atrial size was mildly dilated.  4. The mitral valve is normal in structure. Trivial mitral valve regurgitation. No evidence of mitral stenosis.  5. The aortic valve is normal in structure. Aortic valve regurgitation is not visualized. No aortic stenosis is present.  6. The inferior vena cava is normal in size with greater than 50% respiratory variability, suggesting right atrial pressure of 3 mmHg. FINDINGS  Left Ventricle: Left ventricular ejection fraction, by estimation, is 65 to 70%. The left ventricle has normal function. The left ventricle has no regional wall motion abnormalities.  The left ventricular internal cavity size was normal in size. There is  mild concentric left ventricular hypertrophy. Left ventricular diastolic function could not be evaluated due to atrial fibrillation. Left ventricular diastolic function could not be evaluated. Right Ventricle: The right ventricular size is normal. No increase in right ventricular wall thickness. Right ventricular systolic function is normal. Left Atrium: Left atrial size was mildly dilated. Right Atrium: Right atrial size was normal in size. Pericardium: There is no evidence of pericardial effusion. Mitral Valve: The mitral valve is normal in structure. Trivial mitral valve regurgitation. No evidence of mitral valve stenosis. Tricuspid Valve: The tricuspid valve is normal in structure. Tricuspid valve regurgitation is mild . No evidence of tricuspid stenosis. Aortic Valve: The aortic valve is normal in structure. Aortic valve regurgitation is not visualized. No aortic stenosis is present. Aortic valve peak gradient measures 9.1 mmHg. Pulmonic Valve: The pulmonic valve was normal in structure. Pulmonic valve regurgitation is trivial. No evidence of pulmonic stenosis. Aorta: The aortic root is normal in size and structure. Venous: The inferior vena cava is normal in size with greater than 50% respiratory variability, suggesting right atrial pressure of 3 mmHg. IAS/Shunts: No atrial level shunt detected by color flow Doppler.  LEFT VENTRICLE PLAX 2D LVIDd:         3.70 cm   Diastology LVIDs:         2.50 cm   LV e' medial:    6.85 cm/s LV PW:         1.30 cm   LV E/e' medial:  15.9 LV IVS:        1.10 cm   LV e' lateral:   8.16 cm/s LVOT diam:     1.70 cm   LV E/e' lateral: 13.4 LV SV:         40 LV SV Index:   21 LVOT Area:     2.27 cm  RIGHT VENTRICLE            IVC RV S prime:     8.05 cm/s  IVC diam: 2.00 cm TAPSE (M-mode): 1.0 cm LEFT ATRIUM             Index        RIGHT ATRIUM           Index LA diam:        3.50 cm 1.81 cm/m   RA Area:      14.30 cm LA Vol (A2C):   36.8 ml 19.03 ml/m  RA Volume:   34.00 ml  17.58 ml/m LA Vol (A4C):   47.8 ml 24.72 ml/m LA Biplane Vol: 45.4 ml 23.48 ml/m  AORTIC VALVE AV Area (Vmax): 1.34 cm AV Vmax:        151.00 cm/s AV Peak Grad:   9.1 mmHg LVOT Vmax:  89.23 cm/s LVOT Vmean:     58.400 cm/s LVOT VTI:       0.178 m  AORTA Ao Root diam: 2.40 cm Ao Asc diam:  2.80 cm MITRAL VALVE                TRICUSPID VALVE MV Area (PHT): 4.15 cm     TR Peak grad:   36.7 mmHg MV Decel Time: 183 msec     TR Vmax:        303.00 cm/s MR Peak grad: 94.5 mmHg MR Vmax:      486.00 cm/s   SHUNTS MV E velocity: 109.00 cm/s  Systemic VTI:  0.18 m MV A velocity: 37.20 cm/s   Systemic Diam: 1.70 cm MV E/A ratio:  2.93 Arvilla Meres MD Electronically signed by Arvilla Meres MD Signature Date/Time: 02/25/2023/11:20:33 AM    Final    CT Cervical Spine Wo Contrast  Result Date: 02/24/2023 CLINICAL DATA:  82 year old female with syncope, chest tightness, shortness of breath. Pain and nausea. EXAM: CT CERVICAL SPINE WITHOUT CONTRAST TECHNIQUE: Multidetector CT imaging of the cervical spine was performed without intravenous contrast. Multiplanar CT image reconstructions were also generated. RADIATION DOSE REDUCTION: This exam was performed according to the departmental dose-optimization program which includes automated exposure control, adjustment of the mA and/or kV according to patient size and/or use of iterative reconstruction technique. COMPARISON:  Head CT today. FINDINGS: Alignment: Normal cervical lordosis. Cervicothoracic junction alignment is within normal limits. Bilateral posterior element alignment is within normal limits. Skull base and vertebrae: Bone mineralization is within normal limits for age. Visualized skull base is intact. No atlanto-occipital dissociation. C1 and C2 appear intact and aligned. No acute osseous abnormality identified. Soft tissues and spinal canal: No prevertebral fluid or swelling. No visible  canal hematoma. Bulky calcified proximal right ICA atherosclerosis, otherwise negative visible noncontrast neck soft tissues. Disc levels: Upper cervical facet arthropathy but otherwise mild for age cervical spine degeneration. Upper chest: Calcified aortic atherosclerosis. Mild respiratory motion. Visible upper thoracic levels appear grossly intact. IMPRESSION: 1. No acute traumatic injury identified in the cervical spine. 2. Mild for age cervical spine degeneration aside from upper cervical facet arthropathy. 3. Bulky calcified right carotid atherosclerosis. Electronically Signed   By: Odessa Fleming M.D.   On: 02/24/2023 10:44   CT Head Wo Contrast  Result Date: 02/24/2023 CLINICAL DATA:  82 year old female with syncope, chest tightness, shortness of breath. Pain and nausea. EXAM: CT HEAD WITHOUT CONTRAST TECHNIQUE: Contiguous axial images were obtained from the base of the skull through the vertex without intravenous contrast. RADIATION DOSE REDUCTION: This exam was performed according to the departmental dose-optimization program which includes automated exposure control, adjustment of the mA and/or kV according to patient size and/or use of iterative reconstruction technique. COMPARISON:  None Available. FINDINGS: Brain: Occasional small areas of dural calcification (normal variant more pronounced in the right hemisphere. Cerebral volume is within normal limits for age. No midline shift, ventriculomegaly, mass effect, evidence of mass lesion, intracranial hemorrhage or evidence of cortically based acute infarction. Gray-white matter differentiation mostly normal for age. There is minimal to mild left frontal lobe white matter hypodensity. No cortical encephalomalacia. Vascular: Calcified atherosclerosis at the skull base. No suspicious intracranial vascular hyperdensity. Skull: No acute osseous abnormality identified. Sinuses/Orbits: Visualized paranasal sinuses and mastoids are clear. Other: No orbit or scalp  soft tissue injury identified. IMPRESSION: 1. No acute intracranial abnormality or acute traumatic injury identified. 2. Largely normal for age; minimal cerebral white matter  changes, calcified atherosclerosis. Electronically Signed   By: Odessa Fleming M.D.   On: 02/24/2023 10:42   DG Chest Portable 1 View  Result Date: 02/24/2023 CLINICAL DATA:  Chest pain.  Syncopal episode. EXAM: PORTABLE CHEST 1 VIEW COMPARISON:  01/13/2023. FINDINGS: Cardiac silhouette is mildly enlarged. No mediastinal or hilar masses. Clear lungs.  No pleural effusion or pneumothorax. Skeletal structures are grossly intact. IMPRESSION: No active disease. Electronically Signed   By: Amie Portland M.D.   On: 02/24/2023 09:55    Labs: BNP (last 3 results) Recent Labs    01/13/23 0032  BNP 47.5   Basic Metabolic Panel: Recent Labs  Lab 02/24/23 0909  NA 135  K 4.8  CL 106  CO2 20*  GLUCOSE 221*  BUN 32*  CREATININE 1.20*  CALCIUM 9.0  MG 1.9   Recent Labs  Lab 02/24/23 0909 02/25/23 0446 02/26/23 0446 02/27/23 0326  WBC 8.4 9.4 9.2 9.2  HGB 12.0 11.7* 11.7* 11.8*  HCT 39.0 37.4 35.6* 36.6  MCV 88.4 86.6 84.8 84.7  PLT 324 318 335 336   CBG: Recent Labs  Lab 02/26/23 1329 02/26/23 1723 02/26/23 2129 02/27/23 0812 02/27/23 1114  GLUCAP 218* 136* 219* 223* 160*   D-Dimer Recent Labs    02/24/23 1544  DDIMER 0.82*   Hgb A1c Recent Labs    02/25/23 0446  HGBA1C 8.9*   No results for input(s): "VITAMINB12", "FOLATE", "FERRITIN", "TIBC", "IRON", "RETICCTPCT" in the last 72 hours. Urinalysis    Component Value Date/Time   COLORURINE YELLOW 01/12/2020 1350   APPEARANCEUR CLEAR 01/12/2020 1350   LABSPEC 1.020 01/12/2020 1350   PHURINE 6.0 01/12/2020 1350   GLUCOSEU >=1000 (A) 01/12/2020 1350   HGBUR NEGATIVE 01/12/2020 1350   BILIRUBINUR NEGATIVE 01/12/2020 1350   KETONESUR NEGATIVE 01/12/2020 1350   UROBILINOGEN 0.2 01/12/2020 1350   NITRITE NEGATIVE 01/12/2020 1350   LEUKOCYTESUR  NEGATIVE 01/12/2020 1350   Sepsis Labs Recent Labs  Lab 02/24/23 0909 02/25/23 0446 02/26/23 0446 02/27/23 0326  WBC 8.4 9.4 9.2 9.2   Microbiology No results found for this or any previous visit (from the past 240 hour(s)).   Time coordinating discharge: 25 minutes SIGNED: Lanae Boast, MD  [facility] 02/27/2023, 12:55 PM  If 7PM-7AM, please contact night-coverage [email or web address]

## 2023-02-28 ENCOUNTER — Telehealth: Payer: Self-pay

## 2023-02-28 NOTE — Transitions of Care (Post Inpatient/ED Visit) (Signed)
   02/28/2023  Name: Rachel [name] MRN: [id number] DOB: [birth year]  Today's TOC FU Call Status: Today's TOC FU Call Status:: Unsuccessul Call (1st Attempt) Unsuccessful Call (1st Attempt) Date: 02/28/23  Attempted to reach the patient regarding the most recent Inpatient/ED visit.  Follow Up Plan: Additional outreach attempts will be made to reach the patient to complete the Transitions of Care (Post Inpatient/ED visit) call.   Signature   Robyne Peers, RN

## 2023-03-04 ENCOUNTER — Telehealth: Payer: Self-pay

## 2023-03-04 ENCOUNTER — Other Ambulatory Visit: Payer: Self-pay | Admitting: Family

## 2023-03-04 DIAGNOSIS — J4531 Mild persistent asthma with (acute) exacerbation: Secondary | ICD-10-CM

## 2023-03-04 MED ORDER — BUDESONIDE 0.25 MG/2ML IN SUSP
0.2500 mg | Freq: Two times a day (BID) | RESPIRATORY_TRACT | 4 refills | Status: AC
Start: 2023-03-04 — End: ?

## 2023-03-04 NOTE — Telephone Encounter (Signed)
Budesonide nebulizer solution and nebulizer machine prescribed. Please let me know if I can further assist. Thank you.

## 2023-03-04 NOTE — Telephone Encounter (Signed)
From the discharge call:  She stated she is feeling better. She has all of her medications including the pulmicort but needs a nebulizer  She has a glucometer and BP monitor. Her blood sugar this morning was 167 and BP: 180/71  Follow up with Ricky Stabs NP - 03/26/2023.

## 2023-03-04 NOTE — Transitions of Care (Post Inpatient/ED Visit) (Signed)
03/04/2023  Name: Rachel [name] MRN: [id number] DOB: July 09, 1941  Today's TOC FU Call Status: Today's TOC FU Call Status:: Successful TOC FU Call Competed Unsuccessful Call (1st Attempt) Date: 02/28/23 [facility] FU Call Complete Date: 03/04/23  Transition Care Management Follow-up Telephone Call Date of Discharge: 02/27/23 Discharge Facility: [facility]) Type of Discharge: Inpatient Admission Primary Inpatient Discharge Diagnosis:: afib How have you been since you were released from the hospital?: Better Any questions or concerns?: Yes Patient Questions/Concerns:: She has all of her medications including the pulmicort but needs a nebulizer Patient Questions/Concerns Addressed: Notified Provider of Patient Questions/Concerns  Items Reviewed: Did you receive and understand the discharge instructions provided?: Yes Medications obtained,verified, and reconciled?: Partial Review Completed Reason for Partial Mediation Review: She said she has all of her medications and did not have any questions about the meds and did not need to review the med list  She has a glucometer and BP monitor. Her blood sugar this morning was 167 and BP: 180/71 Any new allergies since your discharge?: No Dietary orders reviewed?: Yes Type of Diet Ordered:: heart healthy, low sodium Do you have support at home?: Yes People in Home: alone Name of Support/Comfort Primary Source: her family checks on/stays with her frequently.  She said that she had to tell them to go home, she needed some time to herself.  Medications Reviewed Today: Medications Reviewed Today     Reviewed by Salvatore Marvel, CPhT (Pharmacy Technician) on 02/24/23 at 1511  Med List Status: Complete   Medication Order Taking? Sig Documenting Provider Last Dose Status Informant  acetaminophen (TYLENOL) 500 MG tablet [id number] Yes Take 500 mg by mouth every 8 (eight) hours as needed for mild pain or headache. [provider] unk  Active Self  albuterol (VENTOLIN HFA) 108 (90 Base) MCG/ACT inhaler [id number] Yes INHALE 1-2 PUFFS BY MOUTH EVERY 6 HOURS AS NEEDED FOR WHEEZE OR SHORTNESS OF BREATH  Patient taking differently: Inhale 1-2 puffs into the lungs every 6 (six) hours as needed for wheezing or shortness of breath.   Rema Fendt, NP 02/22/2023 Active Self  amLODipine (NORVASC) 2.5 MG tablet [id number] Yes Take 1 tablet (2.5 mg total) by mouth daily. Rema Fendt, NP 02/24/2023 am Active Self  atorvastatin (LIPITOR) 20 MG tablet [id number] No Take 1 tablet (20 mg total) by mouth daily.  Patient not taking: Reported on 02/24/2023   Chilton Si, MD Not Taking Active Self  Blood Glucose Monitoring Suppl [facility] VERIO REFLECT) w/Device KIT [id number]  1 each by Does not apply route daily. E11.9 [provider]  Active Self  cetirizine (ZYRTEC) 5 MG tablet [id number] No Take 1 tablet (5 mg total) by mouth daily.  Patient not taking: Reported on 02/24/2023   Wallis Bamberg, PA-C Not Taking Active Self  diclofenac Sodium (VOLTAREN) 1 % GEL [id number] No Apply 2 g topically 4 (four) times daily as needed.  Patient not taking: Reported on 02/24/2023   Chilton Si, MD Not Taking Active Self  fluticasone [place]) 50 MCG/ACT nasal spray [id number] Yes Place 2 sprays into both nostrils daily as needed for allergies or rhinitis. [provider] unk Active Self  furosemide (LASIX) 20 MG tablet [id number] No Take 1 tablet (20 mg total) by mouth 2 (two) times daily for 5 days.  Patient not taking: Reported on 02/24/2023   Pollyann Savoy, MD Not Taking Active Self  Glucose Blood [facility] VERIO VI) [id number]  1 each by Other route daily. E11.9 [provider]  Active Self  glucose blood (ONETOUCH VERIO) test strip [id number]  Use to check blood sugars once a day Georganna Skeans, MD  Active Self  ibuprofen (ADVIL) 800 MG tablet [id number] No Take 1 tablet (800 mg total) by mouth every 8 (eight) hours as  needed.  Patient not taking: Reported on 02/24/2023   Georganna Skeans, MD Not Taking Active Self  loratadine (CLARITIN) 10 MG tablet [id number] Yes Take 1 tablet (10 mg total) by mouth daily as needed for allergies.  Patient taking differently: Take 10 mg by mouth in the morning.   Zenia Resides, MD 02/23/2023 am Active Self  Multiple Vitamins-Minerals (HAIR SKIN AND NAILS FORMULA) TABS [id number] Yes Take 1 tablet by mouth daily. [provider] Past Week Active Self  nystatin (MYCOSTATIN/NYSTOP) powder [id number] Yes Apply topically 4 (four) times daily.  Patient taking differently: Apply topically 4 (four) times daily as needed (for rashes).   Pincus Sanes, MD unk Active Self  nystatin cream (MYCOSTATIN) [id number] Yes Apply 1 application topically 2 (two) times daily.  Patient taking differently: Apply 1 application  topically 2 (two) times daily as needed for dry skin (or rashes).   Pincus Sanes, MD unk Active Self  OneTouch Delica Lancets 30G MISC [id number]  E11.9 Georganna Skeans, MD  Active Self  predniSONE (DELTASONE) 20 MG tablet [id number] Yes Take 2 tablets (40 mg total) by mouth daily with breakfast for 3 days. Zenia Resides, MD 02/22/2023 SEE NOTE Active Self           Med Note Antony Madura, Dyane Dustman Feb 24, 2023  2:53 PM) The patient said she has 2 tablets left  pseudoephedrine (SUDAFED) 30 MG tablet [id number] No Take 1 tablet (30 mg total) by mouth every 8 (eight) hours as needed for congestion.  Patient not taking: Reported on 02/24/2023   Wallis Bamberg, PA-C Not Taking Active Self  repaglinide (PRANDIN) 2 MG tablet [id number] No Take 2 tablets (4 mg total) by mouth 2 (two) times daily before a meal.  Patient not taking: Reported on 02/24/2023   Rema Fendt, NP Not Taking Active Self  spironolactone (ALDACTONE) 25 MG tablet [id number] Yes Take 1 tablet (25 mg total) by mouth daily. Rema Fendt, NP 02/24/2023 am Active Self  valsartan-hydrochlorothiazide (DIOVAN-HCT)  320-25 MG tablet [id number] Yes Take 1 tablet by mouth daily. Chilton Si, MD 02/24/2023 am Active Self            Home Care and Equipment/Supplies: Were Home Health Services Ordered?: No Any new equipment or medical supplies ordered?: Yes Name of Medical supply agency?: [employer] Were you able to get the equipment/medical supplies?: Yes Do you have any questions related to the use of the equipment/supplies?: No  Functional Questionnaire: Do you need assistance with bathing/showering or dressing?: No Do you need assistance with meal preparation?: No Do you need assistance with eating?: No Do you have difficulty maintaining continence: No Do you need assistance with getting out of bed/getting out of a chair/moving?: Yes (She has a rollator to use with ambulation however, she stated that she lives in a trilevel home ad is not able to get the rollator up/down the stairs.) Do you have difficulty managing or taking your medications?: No  Follow up appointments reviewed: PCP Follow-up appointment confirmed?: Yes Date of PCP follow-up appointment?: 03/26/23 Follow-up Provider: Ricky Stabs, NP Specialist Hospital Follow-up appointment confirmed?: Yes Date of Specialist follow-up appointment?: 03/08/23 Follow-Up Specialty Provider:: cardiology Do  you need transportation to your follow-up appointment?: No Do you understand care options if your condition(s) worsen?: Yes-patient verbalized understanding    SIGNATURE  Robyne Peers RN

## 2023-03-06 NOTE — Telephone Encounter (Signed)
Hold coffee for now and discuss in more detail at upcoming Cardiology appointment on 03/08/2023.

## 2023-03-06 NOTE — Telephone Encounter (Signed)
I tried to call patient and inform her of message from Ms Zonia Kief, NP but I was not able to leave a message, phone memory full.

## 2023-03-06 NOTE — Telephone Encounter (Signed)
Call placed to patient to inquire if she has a preference for DME companies for nebulizer.  She had no preference and was in agreement to sending the order to [facility].  I informed her that [employer] would let her know if she has any out of pocket cost.  She also wanted to know if there is any reason why she can't have some caffeinated coffee - 1 cup day.  She said no one told her to avoid it.   Order for nebulizer faxed to [facility]

## 2023-03-07 ENCOUNTER — Other Ambulatory Visit: Payer: Self-pay | Admitting: Family

## 2023-03-07 DIAGNOSIS — E1140 Type 2 diabetes mellitus with diabetic neuropathy, unspecified: Secondary | ICD-10-CM

## 2023-03-07 MED ORDER — REPAGLINIDE 2 MG PO TABS
4.0000 mg | ORAL_TABLET | Freq: Three times a day (TID) | ORAL | 2 refills | Status: DC
Start: 2023-03-07 — End: 2023-08-13

## 2023-03-07 NOTE — Telephone Encounter (Signed)
Complete

## 2023-03-07 NOTE — Telephone Encounter (Signed)
I spoke to the patient and informed her that Ms Zonia Kief, NP recommends that she hold off on coffee for now and discuss this issue with the cardiologist at her upcoming appointment on 03/08/2023.  The patient was very appreciative of the information and said she understood

## 2023-03-08 ENCOUNTER — Ambulatory Visit (INDEPENDENT_AMBULATORY_CARE_PROVIDER_SITE_OTHER): Payer: PPO | Admitting: Student

## 2023-03-08 ENCOUNTER — Encounter (HOSPITAL_BASED_OUTPATIENT_CLINIC_OR_DEPARTMENT_OTHER): Payer: Self-pay | Admitting: Family

## 2023-03-08 ENCOUNTER — Ambulatory Visit (HOSPITAL_BASED_OUTPATIENT_CLINIC_OR_DEPARTMENT_OTHER): Payer: PPO | Admitting: Family

## 2023-03-08 ENCOUNTER — Encounter (HOSPITAL_BASED_OUTPATIENT_CLINIC_OR_DEPARTMENT_OTHER): Payer: Self-pay | Admitting: Student

## 2023-03-08 ENCOUNTER — Ambulatory Visit (HOSPITAL_BASED_OUTPATIENT_CLINIC_OR_DEPARTMENT_OTHER): Payer: PPO

## 2023-03-08 VITALS — BP 136/86 | HR 50 | Ht 63.0 in | Wt 199.2 lb

## 2023-03-08 DIAGNOSIS — I480 Paroxysmal atrial fibrillation: Secondary | ICD-10-CM | POA: Diagnosis not present

## 2023-03-08 DIAGNOSIS — M1712 Unilateral primary osteoarthritis, left knee: Secondary | ICD-10-CM

## 2023-03-08 DIAGNOSIS — M25562 Pain in left knee: Secondary | ICD-10-CM

## 2023-03-08 DIAGNOSIS — I10 Essential (primary) hypertension: Secondary | ICD-10-CM

## 2023-03-08 DIAGNOSIS — E785 Hyperlipidemia, unspecified: Secondary | ICD-10-CM

## 2023-03-08 DIAGNOSIS — I25118 Atherosclerotic heart disease of native coronary artery with other forms of angina pectoris: Secondary | ICD-10-CM

## 2023-03-08 DIAGNOSIS — D6859 Other primary thrombophilia: Secondary | ICD-10-CM

## 2023-03-08 DIAGNOSIS — R040 Epistaxis: Secondary | ICD-10-CM | POA: Diagnosis not present

## 2023-03-08 MED ORDER — APIXABAN 5 MG PO TABS
5.0000 mg | ORAL_TABLET | Freq: Two times a day (BID) | ORAL | 5 refills | Status: DC
Start: 2023-03-08 — End: 2023-10-28

## 2023-03-08 MED ORDER — ROSUVASTATIN CALCIUM 5 MG PO TABS
5.0000 mg | ORAL_TABLET | Freq: Every day | ORAL | 3 refills | Status: DC
Start: 2023-03-08 — End: 2023-12-16

## 2023-03-08 MED ORDER — APIXABAN 5 MG PO TABS: 5.0000 mg | ORAL_TABLET | Freq: Two times a day (BID) | ORAL | 0 refills | Status: DC

## 2023-03-08 MED ORDER — SPIRONOLACTONE 25 MG PO TABS
12.5000 mg | ORAL_TABLET | Freq: Every day | ORAL | 3 refills | Status: DC
Start: 2023-03-08 — End: 2023-07-17

## 2023-03-08 MED ORDER — DILTIAZEM HCL ER COATED BEADS 120 MG PO CP24
120.0000 mg | ORAL_CAPSULE | Freq: Every day | ORAL | 3 refills | Status: DC
Start: 2023-03-08 — End: 2023-08-17

## 2023-03-08 MED ORDER — METOPROLOL SUCCINATE ER 25 MG PO TB24
25.0000 mg | ORAL_TABLET | Freq: Every day | ORAL | 3 refills | Status: DC
Start: 2023-03-08 — End: 2023-08-17

## 2023-03-08 NOTE — Progress Notes (Unsigned)
Chief Complaint: Left knee pain     History of Present Illness:    Rachel [name] is a 82 y.o. female presenting today for evaluation of left knee pain.  She states that this began due to a fall from a syncopal episode on June 1.  She was then hospitalized for 4 days after.  Patient states that the pain is in the medial side of the knee and is moderate in intensity.  It does bother her to walk and put weight through the knee.  Patient has only taken Tylenol once per day for the last 2 days for pain control.  She is on long-term Eliquis.  She is also a type II diabetic and last A1c on 02/25/2023 was 8.9.  Does report previous history of problems with his knee however no previous knee surgery.   Surgical History:   None  PMH/PSH/Family History/Social History/Meds/Allergies:    Past Medical History:  Diagnosis Date   (HFpEF) heart failure with preserved ejection fraction (HCC) 01/17/2023   Allergic rhinitis    Diabetes mellitus, type 2 (HCC)    Hyperlipemia    Hypertension    Left shoulder pain 11/01/2021   Lipoma NEC    anterior upper chest   Osteoarthritis of knee    Shortness of breath 11/01/2021   Past Surgical History:  Procedure Laterality Date   ACNE CYST REMOVAL  1970   back   APPENDECTOMY  1963   LEFT HEART CATH AND CORONARY ANGIOGRAPHY N/A 02/25/2023   Procedure: LEFT HEART CATH AND CORONARY ANGIOGRAPHY;  Surgeon: Kathleene Hazel, MD;  Location: [facility] INVASIVE [facility];  Service: Cardiovascular;  Laterality: N/A;   OOPHORECTOMY  1985   VAGINAL HYSTERECTOMY  1985   complete   Social History   Socioeconomic History   Marital status: Widowed    Spouse name: Not on file   Number of children: Not on file   Years of education: Not on file   Highest education level: Not on file  Occupational History   Occupation: department mang    Employer: [employer]    Comment: Retired  Tobacco Use   Smoking status: Never     Passive exposure: Never   Smokeless tobacco: Never  Vaping Use   Vaping Use: Never used  Substance and Sexual Activity   Alcohol use: Yes    Alcohol/week: 1.0 standard drink of alcohol    Types: 1 Glasses of wine per week    Comment: ocassionally   Drug use: No   Sexual activity: Not Currently  Other Topics Concern   Not on file  Social History Narrative   No regular exercise   Widowed   Social Determinants of Health   Financial Resource Strain: Low Risk  (11/01/2021)   Overall Financial Resource Strain (CARDIA)    Difficulty of Paying Living Expenses: Not hard at all  Food Insecurity: No Food Insecurity (02/24/2023)   Hunger Vital Sign    Worried About Running Out of Food in the Last Year: Never true    Ran Out of Food in the Last Year: Never true  Transportation Needs: No Transportation Needs (02/24/2023)   PRAPARE - Administrator, Civil Service (Medical): No    Lack of Transportation (Non-Medical): No  Physical Activity: Inactive (11/01/2021)   Exercise Vital  Sign    Days of Exercise per Week: 0 days    Minutes of Exercise per Session: 0 min  Stress: Not on file  Social Connections: Not on file   Family History  Problem Relation Age of Onset   Diabetes Mother    Heart attack Mother    Cancer Father        brain   Diabetes Sister    Hypertension Sister    Diabetes Brother    Hypertension Brother    Coronary artery disease Brother    Aneurysm Other        niece   Allergies  Allergen Reactions   Aspirin Nausea And Vomiting and Other (See Comments)    Uncoated version = "Jittery and GI upset" (has tolerated the chewable version)   Atorvastatin Other (See Comments)    Caused aching in the body   Codeine Sulfate Nausea And Vomiting and Other (See Comments)    Heart races also   Glimepiride Diarrhea   Metformin Diarrhea   Prandin [Repaglinide] Other (See Comments)    "it upset my stomach"   Propoxyphene N-Acetaminophen Nausea And Vomiting   Current  Outpatient Medications  Medication Sig Dispense Refill   acetaminophen (TYLENOL) 500 MG tablet Take 500 mg by mouth every 8 (eight) hours as needed for mild pain or headache.     albuterol (VENTOLIN HFA) 108 (90 Base) MCG/ACT inhaler INHALE 1-2 PUFFS BY MOUTH EVERY 6 HOURS AS NEEDED FOR WHEEZE OR SHORTNESS OF BREATH (Patient taking differently: Inhale 1-2 puffs into the lungs every 6 (six) hours as needed for wheezing or shortness of breath.) 18 each 1   apixaban (ELIQUIS) 5 MG TABS tablet Take 1 tablet (5 mg total) by mouth 2 (two) times daily. 60 tablet 5   apixaban (ELIQUIS) 5 MG TABS tablet Take 1 tablet (5 mg total) by mouth 2 (two) times daily. 28 tablet 0   Blood Glucose Monitoring Suppl (ONETOUCH VERIO REFLECT) w/Device KIT 1 each by Does not apply route daily. E11.9     budesonide (PULMICORT) 0.25 MG/2ML nebulizer solution Take 2 mLs (0.25 mg total) by nebulization 2 (two) times daily. 60 mL 4   cetirizine (ZYRTEC) 5 MG tablet Take 1 tablet (5 mg total) by mouth daily. (Patient not taking: Reported on 02/24/2023) 90 tablet 0   diclofenac Sodium (VOLTAREN) 1 % GEL Apply 2 g topically 4 (four) times daily as needed. (Patient not taking: Reported on 02/24/2023) 50 g 1   diltiazem (CARDIZEM CD) 120 MG 24 hr capsule Take 1 capsule (120 mg total) by mouth daily. 90 capsule 3   fluticasone (FLONASE) 50 MCG/ACT nasal spray Place 2 sprays into both nostrils daily as needed for allergies or rhinitis.     Glucose Blood (ONETOUCH VERIO VI) 1 each by Other route daily. E11.9     glucose blood (ONETOUCH VERIO) test strip Use to check blood sugars once a day 100 strip 5   ipratropium (ATROVENT HFA) 17 MCG/ACT inhaler Inhale 1 puff into the lungs 3 (three) times daily. 1 each 2   loratadine (CLARITIN) 10 MG tablet Take 1 tablet (10 mg total) by mouth daily as needed for allergies. (Patient taking differently: Take 10 mg by mouth in the morning.) 30 tablet 0   metoprolol succinate (TOPROL-XL) 25 MG 24 hr tablet  Take 1 tablet (25 mg total) by mouth at bedtime. 90 tablet 3   Multiple Vitamins-Minerals (HAIR SKIN AND NAILS FORMULA) TABS Take 1 tablet by mouth daily.  nystatin (MYCOSTATIN/NYSTOP) powder Apply topically 4 (four) times daily. (Patient taking differently: Apply topically 4 (four) times daily as needed (for rashes).) 60 g 0   nystatin cream (MYCOSTATIN) Apply 1 application topically 2 (two) times daily. (Patient taking differently: Apply 1 application  topically 2 (two) times daily as needed for dry skin (or rashes).) 30 g 1   OneTouch Delica Lancets 30G MISC E11.9 100 each 3   pantoprazole (PROTONIX) 40 MG tablet Take 1 tablet (40 mg total) by mouth daily. 30 tablet 0   repaglinide (PRANDIN) 2 MG tablet Take 2 tablets (4 mg total) by mouth 3 (three) times daily before meals. 180 tablet 2   rosuvastatin (CRESTOR) 5 MG tablet Take 1 tablet (5 mg total) by mouth daily. 90 tablet 3   spironolactone (ALDACTONE) 25 MG tablet Take 1/2 tablet (12.5 mg total) by mouth daily. 45 tablet 3   valsartan-hydrochlorothiazide (DIOVAN-HCT) 320-25 MG tablet Take 1 tablet by mouth daily. 90 tablet 3   No current facility-administered medications for this visit.   No results found.  Review of Systems:   A ROS was performed including pertinent positives and negatives as documented in the HPI.  Physical Exam :   Constitutional: NAD and appears stated age Neurological: Alert and oriented Psych: Appropriate affect and cooperative There were no vitals taken for this visit.   Comprehensive Musculoskeletal Exam:    Medial joint line tenderness with palpation.  Active range of motion 0 to 110 degrees with crepitus noted.  Mild joint effusion.  No laxity with varus or valgus stress.  Negative Lachman.  Neurosensory exam intact.  Imaging:   Xray (Left knee 4 views): Tricompartmental osteoarthritis with significant medial compartment narrowing.  Lateral joint line and patellar osteophytes noted.    I  personally reviewed and interpreted the radiographs.   Assessment:   82 y.o. female with 2-week history of left knee pain after a fall from a syncopal episode.  She does have significant tricompartmental osteoarthritis that is worst in the medial compartment which is approaching bone-on-bone.  Due to her recent A1c of 8.9 I would not recommend a cortisone injection today and she is unable to take NSAIDs due to anticoagulation with Eliquis 10mg  daily.  At this point I would recommend continuing with Tylenol for pain control as well as topical Voltaren for anti-inflammatory effect.  We did discuss that she might be a candidate for hyaluronic acid injection in the future as well as a possible cortisone injection if her A1c continues down toward 8.0.  Will plan to have her return to clinic should knee pain continue.  Plan :    -Continue with Tylenol and Voltaren -Return to clinic as needed     I personally saw and evaluated the patient, and participated in the management and treatment plan.  Hazle Nordmann, PA-C Orthopedics  This document was dictated using Conservation officer, historic buildings. A reasonable attempt at proof reading has been made to minimize errors.

## 2023-03-08 NOTE — Patient Instructions (Signed)
Medication Instructions:  Your physician has recommended you make the following change in your medication:   Start: Rosuvastatin 5mg  daily   We have sent refills to your pharmacy today!  *If you need a refill on your cardiac medications before your next appointment, please call your pharmacy*  Follow-Up: At [facility], you and your health needs are our priority.  As part of our continuing mission to provide you with exceptional heart care, we have created designated Provider Care Teams.  These Care Teams include your primary Cardiologist (physician) and Advanced Practice Providers (APPs -  Physician Assistants and Nurse Practitioners) who all work together to provide you with the care you need, when you need it.  We recommend signing up for the patient portal called "MyChart".  Sign up information is provided on this After Visit Summary.  MyChart is used to connect with patients for Virtual Visits (Telemedicine).  Patients are able to view lab/test results, encounter notes, upcoming appointments, etc.  Non-urgent messages can be sent to your provider as well.   To learn more about what you can do with MyChart, go to [email or web address].    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  For coronary artery disease often called "heart disease" we aim for optimal guideline directed medical therapy. We use the "A, B, C"s to help keep [place] on track!  A = Aspirin 81mg  daily B = Beta blocker which helps to relax the heart. This is your Metoprolol. C = Cholesterol control. You take Rosuvasstatin to help control your cholesterol.  ______________________  We have referred you to [facility]  If you do not hear from them in 2 weeks go ahead and call.  [address], [place] [address] Phone: [phone]

## 2023-03-08 NOTE — Progress Notes (Signed)
Cardiology Office Note:  .   Date:  03/08/2023  ID:  Rachel [name], DOB March 10, 1941, MRN [id number] PCP: Rema Fendt, NP  [facility] Providers Cardiologist:  Chilton Si, MD    History of Present Illness: .   Rachel [name] is a 82 y.o. female nonobstructive coronary disease, hyperlipidemia, asthma, DM2, HFpEF, hypertension, atrial fibrillation  Usually follows with Dr. Duke Salvia in [facility], initial visit 10/2021. Losartan/HCTZ was switched to Valsartan/HCTZ and Spironolactone added. Amlodipine had to be reduced to 2.5mg  due to edema.   Admitted 6/2 - 02/27/2023 after presenting with rapid new onset atrial fibrillation and syncope. Underwent LHC 6 3/24 with mild nonobstructive CAD (prox RCA 20%, ost-proxCx 20%, ost-prox LAD 20%, mid LAD 20%).  Echo during admission LVEF 65 to 70%, no RWMA, normal RV function, no significant valvular normalities. She transitioned to SB with PO Diltiazem and Toprol prior to discharge. Spironolactone reduced and Amlodipine held.   Presents today for follow up with her daughter.  Reviewed hospital stay, atrial fibrillation pathophysiology and treatment, echocardiogram, cardiac catheterization in detail.  No chest pain, palpitations, lightheadedness, dizziness since discharge.  Exertional dyspnea is overall improving.  Walking stairs in her home for exercise. BP at home has been systolic 160s to 409W however is checking prior to medications.  Educated to check after medication.  Notes when she had her syncopal episode prior to admission she landed her left knee and has been causing her discomfort. She has not tried heat nor ice. Describes as feeling like her knee might "give way". She has taken Tylenol 325 mg twice with improvement in symptoms.  Interested in pursuing further evaluation.  Does note history of predominantly right-sided nosebleeds and is somewhat concerned regarding being on anticoagulation.  Also having  right-sided ear pain that has been ongoing for some time.  Discussed possible referral to ENT for evaluation which she accepts.   ROS: Please see the history of present illness.    All other systems reviewed and are negative.   Studies Reviewed: [name]    EKG: EKG ordered today.  EKG performed today demonstrates a tachycardic 50 bpm and no acute ST/2 changes.   Risk Assessment/Calculations:    CHA2DS2-VASc Score = 6   This indicates a 9.7% annual risk of stroke. The patient's score is based upon: CHF History: 1 HTN History: 1 Diabetes History: 1 Stroke History: 0 Vascular Disease History: 0 Age Score: 2 Gender Score: 1            Physical Exam:   VS:  BP 136/86   Pulse (!) 50   Ht 5\' 3"  (1.6 m)   Wt 199 lb 3.2 oz (90.4 kg)   SpO2 98%   BMI 35.29 kg/m    Wt Readings from Last 3 Encounters:  03/08/23 199 lb 3.2 oz (90.4 kg)  02/25/23 200 lb 9.9 oz (91 kg)  01/17/23 200 lb 9.6 oz (91 kg)    GEN: Well nourished, overweight, well developed in no acute distress NECK: No JVD; No carotid bruits CARDIAC: RRR, no murmurs, rubs, gallops RESPIRATORY:  Clear to auscultation without rales, wheezing or rhonchi  ABDOMEN: Soft, non-tender, non-distended EXTREMITIES:  No edema; No deformity   ASSESSMENT AND PLAN: .    Atrial fibrillation/hypercoagulable state - Maintaining sinus bradycardia by EKG review today.  Denies palpitations.  Continue diltiazem 120 daily, Toprol 25 mg daily.  Refills provided.  Continue Eliquis 5 mg twice daily.  Denies bleeding complications. CHA2DS2-VASc Score = 6 [CHF  History: 1, HTN History: 1, Diabetes History: 1, Stroke History: 0, Vascular Disease History: 0, Age Score: 2, Gender Score: 1].  Therefore, the patient's annual risk of stroke is 9.7 %.    Will request PCP collect CBC/BMP at upcoming visit for monitoring.   Nonobstructive coronary disease/hyperlipidemia, LDL goal less than 70- No recurrent chest pain. Continue GDMT Metoprolol. No ASA due to [facility].  Did not tolerate Atorvastatin 20mg . Rx Rosuvastatin 5mg  QD.   Epistaxis / R ear pain - Reports persistent right sided ear pain. She is understandably concerned regarding being on [facility] and history of right sided nosebleeds. Refer to ENT.   Hypertension-BP reasonably well-controlled today 136/86.  She is checking prior to medications at home and encouraged to check at least an hour after medications.  Continue valsartan-HCTZ 320-25 mg QD, Spironoalctone 12.5mg  QD, Diltiazem 120mg  QD, Toprol 25mg  QD. Phone call in 2 weeks to check in on BP. If not at goal <130/80 consider increasing Spironolactone with careful monitoring of potassium.   HFpEF- Euvolemic and well compensated on exam.  GDMT includes Toprol 25 mg daily, spironolactone 12 5 mg daily. Low sodium diet, fluid restriction <2L, and daily weights encouraged. Educated to contact our office for weight gain of 2 lbs overnight or 5 lbs in one week.   DM2- Continue to follow with PCP.   Syncope - In setting of new onset atrial fib. CT head negative.  Echo 02/24/2023 normal LVEF 55 to 70%, mild LVH, trivial MR.  No recurrent palpitations or lightheadedness, dizziness.  Will defer ZIO monitor at this time.  Could consider in the future.  Recommend no driving 6 months from syncopal event per [place] DMV recommendations.        Dispo: As scheduled in October with Dr. Duke Salvia  Signed, Alver Sorrow, NP

## 2023-03-12 DIAGNOSIS — J45909 Unspecified asthma, uncomplicated: Secondary | ICD-10-CM | POA: Diagnosis not present

## 2023-03-14 ENCOUNTER — Other Ambulatory Visit (HOSPITAL_BASED_OUTPATIENT_CLINIC_OR_DEPARTMENT_OTHER): Payer: PPO

## 2023-03-23 NOTE — Progress Notes (Unsigned)
TRANSITION OF CARE VISIT   Date of Admission: 02/24/2023  Date of Discharge: 02/27/2023  Transitions of Care Call: 03/04/2023  Discharged from: [facility]   Discharge Diagnosis:  Principal Problem:   Afib [facility]) Active Problems:   Acute bronchitis   A-fib (HCC)   Syncope, vasovagal  Summary of Admission per MD note:  Recommendations for Outpatient Follow-up:  Follow up with PCP in 1 weeks-call for appointment Follow-up with cardiology as outpatient Please obtain BMP/CBC in one week  New onset A-fib with RVR new diagnosis placed on heparin drip due to high CHA2DS2-VASc score of 6, started on metoprolol> dose increased to 50 mg bid 6/4- Echo showed EF 55 to 70%, no RWMA, normal right ventricular systolic function, no mitral stenosis aortic valve no aortic stenosis. Remained in A-fib but rate controlled no pauses transition to oral Cardizem, Lopressor and on Eliquis.  Toprol dose decreased to 25 mg at night Cardizem at 120 mg> she converted to sinus rhythm overnight and now in sinus bradycardia.  Cleared by cardiology for discharge home today.   Elevated troponin and epigastric pain unclear etiology GI versus demand ischemia. Underwent cardiac cath 6/3 no significant CAD.  Episode of syncope: LVOT gradient on prior echo: could be contributing to syncope??  Follow-up echo stable cont ptot.  To consider outpatient workup possible loop recorder   Moderate asthma:, Continue Pulmicort and bronchodilator, not needing oxygen.  Hypertension Chronic HFpEF: BP stable. volume status stable continue current medication    DM: Blood sugar stable at this time continue home meds-follow-up with PCP  Class I Obesity:Patient's Body mass index is 35.54 kg/m. : Will benefit with PCP follow-up, weight loss  healthy lifestyle and outpatient sleep evaluation.  Follow-Ups  Follow up with [employer]; [employer]- to be delivered to the room Follow up  with Rema Fendt, NP (Nurse Practitioner)  03/08/2023 [facility] per NP note: ASSESSMENT AND PLAN: .     Atrial fibrillation/hypercoagulable state - Maintaining sinus bradycardia by EKG review today.  Denies palpitations.  Continue diltiazem 120 daily, Toprol 25 mg daily.  Refills provided.  Continue Eliquis 5 mg twice daily.  Denies bleeding complications. CHA2DS2-VASc Score = 6 [CHF History: 1, HTN History: 1, Diabetes History: 1, Stroke History: 0, Vascular Disease History: 0, Age Score: 2, Gender Score: 1].  Therefore, the patient's annual risk of stroke is 9.7 %.    Will request PCP collect CBC/BMP at upcoming visit for monitoring.    Nonobstructive coronary disease/hyperlipidemia, LDL goal less than 70- No recurrent chest pain. Continue GDMT Metoprolol. No ASA due to [facility]. Did not tolerate Atorvastatin 20mg . Rx Rosuvastatin 5mg  QD.    Epistaxis / R ear pain - Reports persistent right sided ear pain. She is understandably concerned regarding being on [facility] and history of right sided nosebleeds. Refer to ENT.    Hypertension-BP reasonably well-controlled today 136/86.  She is checking prior to medications at home and encouraged to check at least an hour after medications.  Continue valsartan-HCTZ 320-25 mg QD, Spironoalctone 12.5mg  QD, Diltiazem 120mg  QD, Toprol 25mg  QD. Phone call in 2 weeks to check in on BP. If not at goal <130/80 consider increasing Spironolactone with careful monitoring of potassium.   HFpEF- Euvolemic and well compensated on exam.  GDMT includes Toprol 25 mg daily, spironolactone 12 5 mg daily. Low sodium diet, fluid restriction <2L, and daily weights encouraged. Educated to contact our office for weight gain of 2 lbs overnight or 5  lbs in one week.    DM2- Continue to follow with PCP.    Syncope - In setting of new onset atrial fib. CT head negative.  Echo 02/24/2023 normal LVEF 55 to 70%, mild LVH, trivial MR.  No recurrent  palpitations or lightheadedness, dizziness.  Will defer ZIO monitor at this time.  Could consider in the future.  Recommend no driving 6 months from syncopal event per [place] DMV recommendations.        Dispo: As scheduled in October with Dr. Duke Salvia  Today's Visit: A1c 8.9 on 02/25/2023 DM eye  DM foot  DM - [place]  Patient/Caregiver self-reported problems/concerns: see above  MEDICATIONS  Medication Reconciliation conducted with patient/caregiver? (Yes/ No): Yes  New medications prescribed/discontinued upon discharge? (Yes/No): Yes  Barriers identified related to medications: No  LABS  Lab Reviewed (Yes/No/NA): Yes  PHYSICAL EXAM:    ASSESSMENT AND PLAN:   PATIENT EDUCATION PROVIDED: See AVS   FOLLOW-UP (Include any further testing or referrals): ***  Patient was given clear instructions to go to Emergency Department or return to medical center if symptoms don't improve, worsen, or new problems develop.The patient verbalized understanding.

## 2023-03-26 ENCOUNTER — Encounter: Payer: Self-pay | Admitting: Family

## 2023-03-26 ENCOUNTER — Ambulatory Visit (INDEPENDENT_AMBULATORY_CARE_PROVIDER_SITE_OTHER): Payer: PPO | Admitting: Family

## 2023-03-26 VITALS — BP 145/76 | HR 59 | Temp 97.7°F | Ht 63.0 in | Wt 199.4 lb

## 2023-03-26 DIAGNOSIS — Z0100 Encounter for examination of eyes and vision without abnormal findings: Secondary | ICD-10-CM | POA: Diagnosis not present

## 2023-03-26 DIAGNOSIS — E1165 Type 2 diabetes mellitus with hyperglycemia: Secondary | ICD-10-CM

## 2023-03-26 DIAGNOSIS — I4891 Unspecified atrial fibrillation: Secondary | ICD-10-CM | POA: Diagnosis not present

## 2023-03-26 DIAGNOSIS — R079 Chest pain, unspecified: Secondary | ICD-10-CM | POA: Diagnosis not present

## 2023-03-26 DIAGNOSIS — G629 Polyneuropathy, unspecified: Secondary | ICD-10-CM | POA: Diagnosis not present

## 2023-03-26 DIAGNOSIS — R55 Syncope and collapse: Secondary | ICD-10-CM | POA: Diagnosis not present

## 2023-03-26 DIAGNOSIS — L602 Onychogryphosis: Secondary | ICD-10-CM

## 2023-03-26 DIAGNOSIS — Z09 Encounter for follow-up examination after completed treatment for conditions other than malignant neoplasm: Secondary | ICD-10-CM

## 2023-03-26 DIAGNOSIS — D229 Melanocytic nevi, unspecified: Secondary | ICD-10-CM | POA: Diagnosis not present

## 2023-03-26 DIAGNOSIS — E119 Type 2 diabetes mellitus without complications: Secondary | ICD-10-CM

## 2023-03-26 NOTE — Progress Notes (Signed)
Pt states when she takes a deep breath she feels pain in her upper back into her ribs. The pain is not constant, and she gets it more when she takes a deep breath or moves certain ways.   Pt states she has a mole on her right ribs that has soreness..  Pt wants to know about handicap placard. Due to hospital stay Pt gets winded when she walks far distances.   Pt wants to know if she has to stay on all her medicines rest of her life.

## 2023-04-03 ENCOUNTER — Telehealth: Payer: Self-pay

## 2023-04-03 NOTE — Telephone Encounter (Signed)
I spoke to [facility] and she confirmed that the nebulizer was delivered to the patient on 03/12/2023.

## 2023-04-11 DIAGNOSIS — R040 Epistaxis: Secondary | ICD-10-CM | POA: Insufficient documentation

## 2023-04-11 DIAGNOSIS — H9201 Otalgia, right ear: Secondary | ICD-10-CM | POA: Diagnosis not present

## 2023-04-11 DIAGNOSIS — J45909 Unspecified asthma, uncomplicated: Secondary | ICD-10-CM | POA: Diagnosis not present

## 2023-04-11 DIAGNOSIS — M26621 Arthralgia of right temporomandibular joint: Secondary | ICD-10-CM | POA: Diagnosis not present

## 2023-05-12 DIAGNOSIS — J45909 Unspecified asthma, uncomplicated: Secondary | ICD-10-CM | POA: Diagnosis not present

## 2023-05-19 ENCOUNTER — Other Ambulatory Visit: Payer: Self-pay | Admitting: Family

## 2023-05-19 DIAGNOSIS — J0100 Acute maxillary sinusitis, unspecified: Secondary | ICD-10-CM

## 2023-05-20 ENCOUNTER — Other Ambulatory Visit: Payer: Self-pay

## 2023-05-20 DIAGNOSIS — J0100 Acute maxillary sinusitis, unspecified: Secondary | ICD-10-CM

## 2023-05-20 MED ORDER — LORATADINE 10 MG PO TABS
10.0000 mg | ORAL_TABLET | Freq: Every day | ORAL | 0 refills | Status: DC | PRN
Start: 2023-05-20 — End: 2023-08-13

## 2023-06-12 DIAGNOSIS — J45909 Unspecified asthma, uncomplicated: Secondary | ICD-10-CM | POA: Diagnosis not present

## 2023-06-14 DIAGNOSIS — H25813 Combined forms of age-related cataract, bilateral: Secondary | ICD-10-CM | POA: Diagnosis not present

## 2023-06-14 DIAGNOSIS — H00021 Hordeolum internum right upper eyelid: Secondary | ICD-10-CM | POA: Diagnosis not present

## 2023-06-17 ENCOUNTER — Telehealth: Payer: Self-pay | Admitting: Cardiovascular Disease

## 2023-06-17 MED ORDER — APIXABAN 5 MG PO TABS: 5.0000 mg | ORAL_TABLET | Freq: Two times a day (BID) | ORAL | Status: DC

## 2023-06-17 NOTE — Telephone Encounter (Signed)
Advised patient able to give her 2 weeks of Eliquis 5 mg tablets  Printed patient assistance forms to see if she would qualify   Wanted to know if she could take OTC multivitamin with her medications and if so what kind   Will forward Pharm D for review

## 2023-06-17 NOTE — Telephone Encounter (Signed)
Patient calling the office for samples of medication:   1.  What medication and dosage are you requesting samples for? apixaban (ELIQUIS) 5 MG TABS tablet  2.  Are you currently out of this medication?   No, patient states she has 2 tablets remaining, but the refill for a 30 day supply will cost her $47.   Patient also mentions that Ricky Stabs, NP, ordered labs but her office was unable to draw them because she has small veins. She would like to confirm whether it will be fine to go by the [facility] location for labs instead.

## 2023-06-18 NOTE — Telephone Encounter (Signed)
Advised patient, verbalized understanding  

## 2023-06-18 NOTE — Telephone Encounter (Signed)
$  47 is normal Eliquis copay with Medicare insurance. Agree can apply for pt assistance but if she doesn't qualify, will probably need warfarin since other DOACs would be the same price or more expensive.  Multivitamin is fine to take. Centrum (or other brands) have a vitamin for women age 80+.

## 2023-07-02 ENCOUNTER — Ambulatory Visit (INDEPENDENT_AMBULATORY_CARE_PROVIDER_SITE_OTHER): Payer: PPO | Admitting: Family

## 2023-07-02 ENCOUNTER — Encounter: Payer: Self-pay | Admitting: Family

## 2023-07-02 VITALS — BP 141/83 | HR 59 | Wt 201.8 lb

## 2023-07-02 DIAGNOSIS — Z0000 Encounter for general adult medical examination without abnormal findings: Secondary | ICD-10-CM

## 2023-07-02 DIAGNOSIS — Z780 Asymptomatic menopausal state: Secondary | ICD-10-CM | POA: Diagnosis not present

## 2023-07-02 DIAGNOSIS — Z13820 Encounter for screening for osteoporosis: Secondary | ICD-10-CM | POA: Diagnosis not present

## 2023-07-02 DIAGNOSIS — E1165 Type 2 diabetes mellitus with hyperglycemia: Secondary | ICD-10-CM

## 2023-07-02 DIAGNOSIS — D229 Melanocytic nevi, unspecified: Secondary | ICD-10-CM | POA: Diagnosis not present

## 2023-07-02 DIAGNOSIS — Z1231 Encounter for screening mammogram for malignant neoplasm of breast: Secondary | ICD-10-CM

## 2023-07-02 DIAGNOSIS — Z0289 Encounter for other administrative examinations: Secondary | ICD-10-CM | POA: Diagnosis not present

## 2023-07-02 NOTE — Progress Notes (Addendum)
Subjective:   Rachel [name] is a 82 y.o. female who presents for Medicare Annual (Subsequent) preventive examination.  Visit Complete: In person   Cardiac Risk Factors include: diabetes mellitus;advanced age (>46men, >64 women)  Her concerns today include: - Reports she is not taking Repaglinide due to causes diarrhea. She denies red flag symptoms associated with diabetes.  - States she plans to schedule an appointment with Podiatry soon.  - States Dermatology never called her since last office visit.  - Requests handicap placard due to chronic left knee pain. She denies recent trauma/injury and red flag symptoms.  - Established with Ophthalmology.  - Established with Cardiology.  Objective:    Today's Vitals   07/02/23 1324  BP: (!) 141/83  Pulse: (!) 59  SpO2: 96%  Weight: 201 lb 12.8 oz (91.5 kg)  PainSc: 6    Body mass index is 35.75 kg/m.  Physical Exam HENT:     Head: Normocephalic and atraumatic.     Nose: Nose normal.     Mouth/Throat:     Mouth: Mucous membranes are moist.     Pharynx: Oropharynx is clear.  Eyes:     Extraocular Movements: Extraocular movements intact.     Conjunctiva/sclera: Conjunctivae normal.     Pupils: Pupils are equal, round, and reactive to light.  Cardiovascular:     Rate and Rhythm: Bradycardia present.     Pulses: Normal pulses.     Heart sounds: Normal heart sounds.  Pulmonary:     Effort: Pulmonary effort is normal.     Breath sounds: Normal breath sounds.  Musculoskeletal:        General: Normal range of motion.     Cervical back: Normal range of motion and neck supple.  Neurological:     General: No focal deficit present.     Mental Status: She is alert and oriented to person, place, and time.  Psychiatric:        Mood and Affect: Mood normal.        Behavior: Behavior normal.        07/02/2023    1:39 PM 02/24/2023    3:00 PM 02/24/2023    9:02 AM 01/13/2023   12:27 AM 09/10/2022    2:22 PM 01/25/2021     9:42 AM 01/04/2021   11:15 AM  Advanced Directives  Does Patient Have a Medical Advance Directive? Yes Yes Yes No No Yes No  Type of Advance Directive Living will Living will    Living will   Does patient want to make changes to medical advance directive?  No - Patient declined       Would patient like information on creating a medical advance directive?    No - Patient declined Yes (Inpatient - patient defers creating a medical advance directive at this time - Information given)  Yes (Inpatient - patient defers creating a medical advance directive at this time - Information given)    Current Medications (verified) Outpatient Encounter Medications as of 07/02/2023  Medication Sig   acetaminophen (TYLENOL) 500 MG tablet Take 500 mg by mouth every 8 (eight) hours as needed for mild pain or headache.   albuterol (VENTOLIN HFA) 108 (90 Base) MCG/ACT inhaler INHALE 1-2 PUFFS BY MOUTH EVERY 6 HOURS AS NEEDED FOR WHEEZE OR SHORTNESS OF BREATH (Patient taking differently: Inhale 1-2 puffs into the lungs every 6 (six) hours as needed for wheezing or shortness of breath.)   apixaban (ELIQUIS) 5 MG TABS tablet Take 1  tablet (5 mg total) by mouth 2 (two) times daily.   budesonide (PULMICORT) 0.25 MG/2ML nebulizer solution Take 2 mLs (0.25 mg total) by nebulization 2 (two) times daily.   cetirizine (ZYRTEC) 5 MG tablet Take 1 tablet (5 mg total) by mouth daily.   diclofenac Sodium (VOLTAREN) 1 % GEL Apply 2 g topically 4 (four) times daily as needed.   glucose blood (ONETOUCH VERIO) test strip Use to check blood sugars once a day   loratadine (CLARITIN) 10 MG tablet Take 1 tablet (10 mg total) by mouth daily as needed for allergies.   metoprolol succinate (TOPROL-XL) 25 MG 24 hr tablet Take 1 tablet (25 mg total) by mouth at bedtime.   nystatin (MYCOSTATIN/NYSTOP) powder Apply topically 4 (four) times daily. (Patient taking differently: Apply topically 4 (four) times daily as needed (for rashes).)    rosuvastatin (CRESTOR) 5 MG tablet Take 1 tablet (5 mg total) by mouth daily.   spironolactone (ALDACTONE) 25 MG tablet Take 1/2 tablet (12.5 mg total) by mouth daily.   valsartan-hydrochlorothiazide (DIOVAN-HCT) 320-25 MG tablet Take 1 tablet by mouth daily.   apixaban (ELIQUIS) 5 MG TABS tablet Take 1 tablet (5 mg total) by mouth 2 (two) times daily for 14 days.   Blood Glucose Monitoring Suppl (ONETOUCH VERIO REFLECT) w/Device KIT 1 each by Does not apply route daily. E11.9   diltiazem (CARDIZEM CD) 120 MG 24 hr capsule Take 1 capsule (120 mg total) by mouth daily.   fluticasone (FLONASE) 50 MCG/ACT nasal spray Place 2 sprays into both nostrils daily as needed for allergies or rhinitis. (Patient not taking: Reported on 07/02/2023)   Glucose Blood (ONETOUCH VERIO VI) 1 each by Other route daily. E11.9   ipratropium (ATROVENT HFA) 17 MCG/ACT inhaler Inhale 1 puff into the lungs 3 (three) times daily. (Patient not taking: Reported on 07/02/2023)   loratadine (CLARITIN) 10 MG tablet TAKE 1 TABLET BY MOUTH EVERY DAY (DRUG NOT COVERED BY INS)   Multiple Vitamins-Minerals (HAIR SKIN AND NAILS FORMULA) TABS Take 1 tablet by mouth daily. (Patient not taking: Reported on 07/02/2023)   nystatin cream (MYCOSTATIN) Apply 1 application topically 2 (two) times daily. (Patient not taking: Reported on 07/02/2023)   OneTouch Delica Lancets 30G MISC E11.9   pantoprazole (PROTONIX) 40 MG tablet Take 1 tablet (40 mg total) by mouth daily.   repaglinide (PRANDIN) 2 MG tablet Take 2 tablets (4 mg total) by mouth 3 (three) times daily before meals.   No facility-administered encounter medications on file as of 07/02/2023.    Allergies (verified) Aspirin, Atorvastatin, Codeine sulfate, Glimepiride, Metformin, Prandin [repaglinide], and Propoxyphene n-acetaminophen   History: Past Medical History:  Diagnosis Date   (HFpEF) heart failure with preserved ejection fraction (HCC) 01/17/2023   Allergic rhinitis    Diabetes  mellitus, type 2 (HCC)    Hyperlipemia    Hypertension    Left shoulder pain 11/01/2021   Lipoma NEC    anterior upper chest   Osteoarthritis of knee    Shortness of breath 11/01/2021   Past Surgical History:  Procedure Laterality Date   ACNE CYST REMOVAL  1970   back   APPENDECTOMY  1963   LEFT HEART CATH AND CORONARY ANGIOGRAPHY N/A 02/25/2023   Procedure: LEFT HEART CATH AND CORONARY ANGIOGRAPHY;  Surgeon: Kathleene Hazel, MD;  Location: [facility];  Service: Cardiovascular;  Laterality: N/A;   OOPHORECTOMY  1985   VAGINAL HYSTERECTOMY  1985   complete   Family History  Problem  Relation Age of Onset   Diabetes Mother    Heart attack Mother    Cancer Father        brain   Diabetes Sister    Hypertension Sister    Diabetes Brother    Hypertension Brother    Coronary artery disease Brother    Aneurysm Other        niece   Social History   Socioeconomic History   Marital status: Widowed    Spouse name: Not on file   Number of children: Not on file   Years of education: Not on file   Highest education level: Not on file  Occupational History   Occupation: department mang    Employer: [employer]    Comment: Retired  Tobacco Use   Smoking status: Never    Passive exposure: Never   Smokeless tobacco: Never  Vaping Use   Vaping status: Never Used  Substance and Sexual Activity   Alcohol use: Yes    Alcohol/week: 1.0 standard drink of alcohol    Types: 1 Glasses of wine per week    Comment: ocassionally   Drug use: No   Sexual activity: Not Currently  Other Topics Concern   Not on file  Social History Narrative   No regular exercise   Widowed   Social Determinants of Health   Financial Resource Strain: Low Risk  (11/01/2021)   Overall Financial Resource Strain (CARDIA)    Difficulty of Paying Living Expenses: Not hard at all  Food Insecurity: No Food Insecurity (02/24/2023)   Hunger Vital Sign    Worried About Running Out of Food in the  Last Year: Never true    Ran Out of Food in the Last Year: Never true  Transportation Needs: No Transportation Needs (02/24/2023)   PRAPARE - Administrator, Civil Service (Medical): No    Lack of Transportation (Non-Medical): No  Physical Activity: Inactive (11/01/2021)   Exercise Vital Sign    Days of Exercise per Week: 0 days    Minutes of Exercise per Session: 0 min  Stress: Not on file  Social Connections: Not on file    Tobacco Counseling Counseling given: Not Answered   Clinical Intake:     Pain : 0-10 Pain Score: 6  Pain Location: Hand Pain Descriptors / Indicators: Aching     Diabetes: Yes            Activities of Daily Living    07/02/2023    1:26 PM 02/24/2023    3:00 PM  In your present state of health, do you have any difficulty performing the following activities:  Hearing? 0 0  Vision? 1 0  Comment at night   Difficulty concentrating or making decisions? 0 0  Walking or climbing stairs? 1 0  Dressing or bathing? 0 0  Doing errands, shopping? 0 0  Preparing Food and eating ? N   Using the Toilet? N   In the past six months, have you accidently leaked urine? N   Do you have problems with loss of bowel control? N   Managing your Medications? N   Managing your Finances? N   Housekeeping or managing your Housekeeping? N     Patient Care Team: Rema Fendt, NP as PCP - General (Nurse Practitioner) Chilton Si, MD as PCP - Cardiology (Cardiology)  Indicate any recent Medical Services you may have received from other than [facility] providers in the past year (date may be approximate).  Assessment:   This is a routine wellness examination for [place].  Hearing/Vision screen No results found.   Goals Addressed             This Visit's Progress    retire and do more traveling, enjoy life.   On track     Depression Screen    07/02/2023    1:43 PM 03/26/2023    1:03 PM 09/10/2022    2:22 PM 06/14/2022    9:37 AM  08/11/2021    2:58 PM 06/14/2021    3:07 PM 04/07/2021    2:16 PM  PHQ 2/9 Scores  PHQ - 2 Score 1 1 0 0 0 0 0  PHQ- 9 Score  4  2  2      Fall Risk    07/02/2023    1:41 PM 03/26/2023    1:04 PM 09/10/2022    2:22 PM 06/14/2022    9:41 AM 11/22/2020   10:46 AM  Fall Risk   Falls in the past year? 0 0 0 1 0  Number falls in past yr: 0 0 0 0 0  Injury with Fall? 0 0 0 0 0  Risk for fall due to : No Fall Risks No Fall Risks   No Fall Risks  Follow up Falls evaluation completed    Falls evaluation completed    MEDICARE RISK AT HOME: Medicare Risk at Home Any stairs in or around the home?: Yes If so, are there any without handrails?: Yes Home free of loose throw rugs in walkways, pet beds, electrical cords, etc?: No Adequate lighting in your home to reduce risk of falls?: Yes Life alert?: No Use of a cane, walker or w/c?: No Grab bars in the bathroom?: Yes Shower chair or bench in shower?: No Elevated toilet seat or a handicapped toilet?: Yes  TIMED UP AND GO:  Was the test performed?  Yes  Length of time to ambulate 10 feet: 20 sec Gait slow and steady without use of assistive device    Cognitive Function:    09/10/2022    2:23 PM 01/04/2021   11:16 AM  MMSE - Mini Mental State Exam  Orientation to time 5 5  Orientation to Place 5 5  Registration 3 3  Attention/ Calculation 5 5  Recall 3 3  Language- name 2 objects 2 2  Language- repeat 1 1  Language- follow 3 step command 3 3  Language- read & follow direction 1 1  Write a sentence 1 1  Copy design 1 1  Total score 30 30        07/02/2023    1:43 PM 09/10/2022    2:23 PM  6CIT Screen  What Year? 0 points 0 points  What month? 0 points 0 points  What time? 0 points 0 points  Count back from 20 0 points 0 points  Months in reverse 0 points 0 points  Repeat phrase 2 points 0 points  Total Score 2 points 0 points    Immunizations Immunization History  Administered Date(s) Administered   PFIZER  Comirnaty(Gray Top)Covid-19 Tri-Sucrose Vaccine 12/16/2020, 06/14/2022   PFIZER(Purple Top)SARS-COV-2 Vaccination 11/22/2019, 03/14/2020   PPD Test 03/14/2011   Pfizer Covid-19 Vaccine Bivalent Booster 23yrs & up 05/31/2021   Pneumococcal Conjugate-13 04/16/2017   Pneumococcal Polysaccharide-23 09/15/2008   Td 09/15/2008   Tdap 09/10/2022    TDAP status: Up to date  Flu Vaccine status: Declined, Education has been provided regarding the importance of this vaccine but  patient still declined. Advised may receive this vaccine at local pharmacy or Health Dept. Aware to provide a copy of the vaccination record if obtained from local pharmacy or Health Dept. Verbalized acceptance and understanding.  Pneumococcal vaccine status: Up to date  Covid-19 vaccine status: Information provided on how to obtain vaccines.   Qualifies for Shingles Vaccine? Yes   Zostavax completed No   Shingrix Completed?: No.    Education has been provided regarding the importance of this vaccine. Patient has been advised to call insurance company to determine out of pocket expense if they have not yet received this vaccine. Advised may also receive vaccine at local pharmacy or Health Dept. Verbalized acceptance and understanding.  Screening Tests Health Maintenance  Topic Date Due   DEXA SCAN  Never done   OPHTHALMOLOGY EXAM  05/25/2010   FOOT EXAM  03/31/2021   COVID-19 Vaccine (6 - 2023-24 season) 05/26/2023   Zoster Vaccines- Shingrix (1 of 2) 10/02/2023 (Originally 09/04/1993)   INFLUENZA VACCINE  12/23/2023 (Originally 04/25/2023)   HEMOGLOBIN A1C  08/27/2023   Diabetic kidney evaluation - Urine ACR  09/11/2023   Diabetic kidney evaluation - eGFR measurement  02/24/2024   Medicare Annual Wellness (AWV)  07/01/2024   DTaP/Tdap/Td (3 - Td or Tdap) 09/10/2032   Pneumonia Vaccine 45+ Years old  Completed   Hepatitis C Screening  Completed   HPV VACCINES  Aged Out    Health Maintenance  Health Maintenance  Due  Topic Date Due   DEXA SCAN  Never done   OPHTHALMOLOGY EXAM  05/25/2010   FOOT EXAM  03/31/2021   COVID-19 Vaccine (6 - 2023-24 season) 05/26/2023    Colorectal cancer screening: No longer required.   Mammogram status: Ordered  . Pt provided with contact info and advised to call to schedule appt.   Bone Density status: Ordered  . Pt provided with contact info and advised to call to schedule appt.  Lung Cancer Screening: (Low Dose CT Chest recommended if Age 65-80 years, 20 pack-year currently smoking OR have quit w/in 15years.) does not qualify.   Lung Cancer Screening Referral: n/a  Additional Screening:  Hepatitis C Screening: does qualify; Completed 12/12/15    Dental Screening: Recommended annual dental exams for proper oral hygiene  Diabetic Foot Exam: Diabetic Foot Exam: Completed 03/26/23  Community Resource Referral / Chronic Care Management: CRR required this visit?  No   CCM required this visit?  No     Plan:  1. Encounter for Medicare annual wellness exam - Counseled on 150 minutes of exercise per week as tolerated, healthy eating (including decreased daily intake of saturated fats, cholesterol, added sugars, sodium), STI prevention, and routine healthcare maintenance.  2. Screening mammogram for breast cancer - Routine screening.  - MM 3D SCREENING MAMMOGRAM BILATERAL BREAST; Future  3. Encounter for osteoporosis screening in asymptomatic postmenopausal patient - Routine screening.  - DG Bone Density; Future  4. Type 2 diabetes mellitus with hyperglycemia, without long-term current use of insulin (HCC) - Patient reports she is not taking Repaglinide due to causes diarrhea. - Routine screening.  - Discussed the importance of healthy eating habits, low-carbohydrate diet, low-sugar diet, regular aerobic exercise (at least 150 minutes a week as tolerated) and medication compliance to achieve or maintain control of diabetes. - Follow-up with primary  provider as scheduled.  - Hemoglobin A1c - Basic Metabolic Panel  5. Enlarged skin mole - Referral to Dermatology for evaluation/management.  - Ambulatory referral to Dermatology  6. Encounter  for completion of form with patient - Handicap placard completed today in office for 6 months.    I have personally reviewed and noted the following in the patient's chart:   Medical and social history Use of alcohol, tobacco or illicit drugs  Current medications and supplements including opioid prescriptions. Patient is not currently taking opioid prescriptions. Functional ability and status Nutritional status Physical activity Advanced directives List of other physicians Hospitalizations, surgeries, and ER visits in previous 12 months Vitals Screenings to include cognitive, depression, and falls Referrals and appointments  In addition, I have reviewed and discussed with patient certain preventive protocols, quality metrics, and best practice recommendations. A written personalized care plan for preventive services as well as general preventive health recommendations were provided to patient.     Rema Fendt, NP   07/02/2023   After Visit Summary: (In Person-Printed) AVS printed and given to the patient

## 2023-07-05 ENCOUNTER — Other Ambulatory Visit: Payer: PPO

## 2023-07-12 DIAGNOSIS — J45909 Unspecified asthma, uncomplicated: Secondary | ICD-10-CM | POA: Diagnosis not present

## 2023-07-15 NOTE — Progress Notes (Signed)
Advanced Hypertension Clinic Follow-up:    Date:  07/17/2023   ID:  [name], DOB 1941/02/28, MRN [id number]  PCP:  Rema Fendt, NP  Cardiologist:  Chilton Si, MD   Referring MD: Rema Fendt, NP   CC: Hypertension  History of Present Illness:    Rachel [name] is a 82 y.o. female with a hx of nonobstructive CAD, atrial fibrillation with RVR, hypertension, asthma, diabetes, here for follow-up. She was initially seen 11/01/2021 in the [facility]. She had a televisit 06/2021 and her blood pressure was 157/87 on amlodipine, losartan, and HCTZ. Amlodipine was increased to 10 mg. She previously had an Echo in 2019 which showed LVEF 65-70% and grade 1 diastolic dysfunction.  At her initial visit, she reported home blood pressures averaging 160-187 systolic prior to taking her medications. She had mildly elevated neck veins on exam. She had no symptoms of pheochromocytoma. Given that she was on ARB we did not check for hyperaldosteronism. We restarted her on 5 mg amlodipine daily (previously intolerant of 10 mg dose but was stable on 5 mg). Losartan/HCTZ was switched to valsartan/HCTZ 320/25 mg daily. She was also started on spironolactone 25 mg daily. She was referred to PREP. Renal artery doppler and echocardiogram were ordered but not completed. Later she reported significant LE swelling, so amlodipine was reduced from 5 mg to 2.5 mg daily. On 01/13/2023 she presented to the ED with complaints of worsening DOE for several days in the setting of months of leg swelling. Noted to be hypertensive in triage but otherwise without distress. BNP was normal. She was discharged on 20 mg furosemide BID.    She was seen 12/2022 and her home blood pressures were averaging 165-167 systolic. This was similar to her BP in the office. There had been some confusion with her medications and some had been lost from her regime. We discontinued hospital-prescribed  diuretics and restarted amlodipine, spironolactone, and valsartan/HCTZ. She had been hesitant to refill her atorvastatin after hearing about potential side effects. She was encouraged to restart her cholesterol medication.  From 6/2-02/27/2023 she was admitted after presenting with syncope and new onset atrial fibrillation with RVR. She also complained of chest pain and troponins were elevated, felt probably due to demand ischemia. She underwent left heart catheterization 02/25/23 showing mild nonobstructive CAD (prox RCA 20%, ost-proxCx 20%, ost-prox LAD 20%, mid LAD 20%). Echo during admission showed LVEF 65 to 70%, no RWMA, normal RV function, no significant valvular abnormalities. She reverted to sinus bradycardia with PO diltiazem and Toprol prior to discharge. Spironolactone had been reduced and amlodipine was held. She followed up with Gillian Shields, NP 03/08/23 and noted home blood pressures in systolic 160s-170s although she was checking prior to taking her medications. In the office her BP was 136/86. She was maintaining sinus bradycardia and denied palpitations. Noted to be intolerant of atorvastatin 20 mg and she was switched to 5 mg rosuvastatin daily.  Today, she is accompanied by her daughter. She complains of feeling fatigued with low energy levels. She is able to walk the length of about 2 houses outside near her home, but she has been hesitant to increase her exercise given her recent hospitalization. She needs to rest after climbing 2 flights of stairs at home. In the office her blood pressure is 143/76 initially, and 142/62 on manual recheck. At home her readings have been averaging 138-140/75-80. She notes that at the time of her syncopal episode, her blood pressure was  close to 200 when transported by EMS. Every once in a while, she will experience a little flutter in her chest. This is not often, and typically lasts for just a second. She has not had any episodes similar to her atrial  fibrillation. For the past 2 days she has complained of a left frontal headache, which is atypical for her as she rarely gets headaches. She took a tylenol last night and her headache seems to have resolved today. Regarding her diet, she remains conscientious of her sodium intake. No salt added to her breakfast, but only adds minimal salt if needed in the recipe. Current supplements include a women's Centrum multivitamin, and Biotin. Additionally, she states that since she had the IV placed while in the hospital, she has noted soreness of her dorsal right hand. She is unable to completely close her right fist and it has been more difficult to open her medication bottles. She also notes that her Eliquis is cost prohibitive. She denies any chest pain, lightheadedness, orthopnea, or PND.  Previous antihypertensives: Amlodipine-increased edema at higher doses  Past Medical History:  Diagnosis Date   (HFpEF) heart failure with preserved ejection fraction (HCC) 01/17/2023   Allergic rhinitis    CAD in native artery 07/17/2023   Diabetes mellitus, type 2 (HCC)    Hyperlipemia    Hypertension    Left shoulder pain 11/01/2021   Lipoma NEC    anterior upper chest   Osteoarthritis of knee    PAF (paroxysmal atrial fibrillation) (HCC) 02/24/2023   Shortness of breath 11/01/2021    Past Surgical History:  Procedure Laterality Date   ACNE CYST REMOVAL  1970   back   APPENDECTOMY  1963   LEFT HEART CATH AND CORONARY ANGIOGRAPHY N/A 02/25/2023   Procedure: LEFT HEART CATH AND CORONARY ANGIOGRAPHY;  Surgeon: Kathleene Hazel, MD;  Location: [facility];  Service: Cardiovascular;  Laterality: N/A;   OOPHORECTOMY  1985   VAGINAL HYSTERECTOMY  1985   complete    Current Medications: Current Meds  Medication Sig   acetaminophen (TYLENOL) 500 MG tablet Take 500 mg by mouth every 8 (eight) hours as needed for mild pain or headache.   albuterol (VENTOLIN HFA) 108 (90 Base) MCG/ACT inhaler  INHALE 1-2 PUFFS BY MOUTH EVERY 6 HOURS AS NEEDED FOR WHEEZE OR SHORTNESS OF BREATH (Patient taking differently: Inhale 1-2 puffs into the lungs every 6 (six) hours as needed for wheezing or shortness of breath.)   apixaban (ELIQUIS) 5 MG TABS tablet Take 1 tablet (5 mg total) by mouth 2 (two) times daily.   Blood Glucose Monitoring Suppl (ONETOUCH VERIO REFLECT) w/Device KIT 1 each by Does not apply route daily. E11.9   budesonide (PULMICORT) 0.25 MG/2ML nebulizer solution Take 2 mLs (0.25 mg total) by nebulization 2 (two) times daily.   cetirizine (ZYRTEC) 5 MG tablet Take 1 tablet (5 mg total) by mouth daily.   diclofenac Sodium (VOLTAREN) 1 % GEL Apply 2 g topically 4 (four) times daily as needed.   diltiazem (CARDIZEM CD) 120 MG 24 hr capsule Take 1 capsule (120 mg total) by mouth daily.   fluticasone (FLONASE) 50 MCG/ACT nasal spray Place 2 sprays into both nostrils daily as needed for allergies or rhinitis.   Glucose Blood (ONETOUCH VERIO VI) 1 each by Other route daily. E11.9   glucose blood (ONETOUCH VERIO) test strip Use to check blood sugars once a day   loratadine (CLARITIN) 10 MG tablet TAKE 1 TABLET BY MOUTH  EVERY DAY (DRUG NOT COVERED BY INS)   loratadine (CLARITIN) 10 MG tablet Take 1 tablet (10 mg total) by mouth daily as needed for allergies.   metoprolol succinate (TOPROL-XL) 25 MG 24 hr tablet Take 1 tablet (25 mg total) by mouth at bedtime.   nystatin (MYCOSTATIN/NYSTOP) powder Apply topically 4 (four) times daily. (Patient taking differently: Apply topically 4 (four) times daily as needed (for rashes).)   OneTouch Delica Lancets 30G MISC E11.9   rosuvastatin (CRESTOR) 5 MG tablet Take 1 tablet (5 mg total) by mouth daily.   valsartan-hydrochlorothiazide (DIOVAN-HCT) 320-25 MG tablet Take 1 tablet by mouth daily.   [DISCONTINUED] spironolactone (ALDACTONE) 25 MG tablet Take 1/2 tablet (12.5 mg total) by mouth daily.     Allergies:   Aspirin, Atorvastatin, Codeine sulfate,  Glimepiride, Metformin, Prandin [repaglinide], and Propoxyphene n-acetaminophen   Social History   Socioeconomic History   Marital status: Widowed    Spouse name: Not on file   Number of children: Not on file   Years of education: Not on file   Highest education level: Not on file  Occupational History   Occupation: department mang    Employer: [employer]    Comment: Retired  Tobacco Use   Smoking status: Never    Passive exposure: Never   Smokeless tobacco: Never  Vaping Use   Vaping status: Never Used  Substance and Sexual Activity   Alcohol use: Yes    Alcohol/week: 1.0 standard drink of alcohol    Types: 1 Glasses of wine per week    Comment: ocassionally   Drug use: No   Sexual activity: Not Currently  Other Topics Concern   Not on file  Social History Narrative   No regular exercise   Widowed   Social Determinants of Health   Financial Resource Strain: Low Risk  (11/01/2021)   Overall Financial Resource Strain (CARDIA)    Difficulty of Paying Living Expenses: Not hard at all  Food Insecurity: No Food Insecurity (02/24/2023)   Hunger Vital Sign    Worried About Running Out of Food in the Last Year: Never true    Ran Out of Food in the Last Year: Never true  Transportation Needs: No Transportation Needs (02/24/2023)   PRAPARE - Administrator, Civil Service (Medical): No    Lack of Transportation (Non-Medical): No  Physical Activity: Inactive (11/01/2021)   Exercise Vital Sign    Days of Exercise per Week: 0 days    Minutes of Exercise per Session: 0 min  Stress: Not on file  Social Connections: Not on file     Family History: The patient's family history includes Aneurysm in an other family member; Cancer in her father; Coronary artery disease in her brother; Diabetes in her brother, mother, and sister; Heart attack in her mother; Hypertension in her brother and sister.  ROS:   Please see the history of present illness.    (+) Fatigue (+)  Occasional palpitations (+) Soreness of right hand All other systems reviewed and are negative.  EKGs/Labs/Other Studies Reviewed:    Left Heart Cath  02/25/2023:   Prox RCA lesion is 20% stenosed.   Ost Cx to Prox Cx lesion is 20% stenosed.   Ost LAD to Prox LAD lesion is 20% stenosed.   Mid LAD lesion is 20% stenosed.   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 Her troponin elevation is likely due to demand ischemia in  the setting of atrial fib with RVR.   Echo  02/25/2023:  1. Left ventricular ejection fraction, by estimation, is 65 to 70%. The  left ventricle has normal function. The left ventricle has no regional  wall motion abnormalities. There is mild concentric left ventricular  hypertrophy. Left ventricular diastolic  function could not be evaluated.   2. Right ventricular systolic function is normal. The right ventricular  size is normal.   3. Left atrial size was mildly dilated.   4. The mitral valve is normal in structure. Trivial mitral valve  regurgitation. No evidence of mitral stenosis.   5. The aortic valve is normal in structure. Aortic valve regurgitation is  not visualized. No aortic stenosis is present.   6. The inferior vena cava is normal in size with greater than 50%  respiratory variability, suggesting right atrial pressure of 3 mmHg.   Chest X-Ray  01/13/2023: IMPRESSION: 1. Mild cardiomegaly and increased central vascular prominence without overt edema. 2. No other evidence for acute chest process. Linear scarring or atelectasis left mid field.  Echo 01/09/2018: Study Conclusions  - Left ventricle: Wall thickness was increased in a pattern of    moderate LVH. Systolic function was vigorous. The estimated    ejection fraction was in the range of 65% to 70%. Wall motion was    normal; there were no regional wall motion abnormalities. Doppler    parameters are consistent with abnormal left  ventricular    relaxation (grade 1 diastolic dysfunction).  - Aortic valve: Peak gradient 18 mmHg across the aortic valve    likely represents LV outflow tract gradient rather than valvular    AS, the valve appears to open well. Peak gradient (S): 18 mm Hg.  - Mitral valve: There was mild regurgitation.  - Right ventricle: The cavity size was normal. Systolic function    was normal.  - Tricuspid valve: Peak RV-RA gradient (S): 37 mm Hg.  - Pulmonary arteries: PA peak pressure: 40 mm Hg (S).  - Inferior vena cava: The vessel was normal in size. The    respirophasic diameter changes were in the normal range (>= 50%),    consistent with normal central venous pressure.   Impressions:  - Normal LV size with moderate LV hypertrophy. EF 65-70%, vigorous    systolic function. Normal RV size and systolic function. Mild    gradient across the LV outflow tract is likely due to    LVH/vigorous LV systolic function rather than valvular AS.   EKG:  EKG is personally reviewed. 07/17/2023:  Not ordered. 01/17/2023:  EKG was not ordered. 11/01/2021: Sinus rhythm. Rate 66 bpm.  Recent Labs: 09/10/2022: ALT 18 01/13/2023: B Natriuretic Peptide 47.5 02/24/2023: BUN 32; Creatinine, Ser 1.20; Magnesium 1.9; Potassium 4.8; Sodium 135; TSH 1.996 02/27/2023: Hemoglobin 11.8; Platelets 336   Recent Lipid Panel    Component Value Date/Time   CHOL 223 (H) 09/10/2022 1554   TRIG 196 (H) 09/10/2022 1554   HDL 42 09/10/2022 1554   CHOLHDL 5.3 (H) 09/10/2022 1554   CHOLHDL 5 01/12/2020 1350   VLDL 45.2 (H) 01/12/2020 1350   LDLCALC 145 (H) 09/10/2022 1554   LDLDIRECT 139.0 01/12/2020 1350    Physical Exam:    VS:  BP (!) 142/62 (BP Location: Left Arm, Patient Position: Sitting, Cuff Size: Large)   Pulse (!) 56   Ht 5\' 3"  (1.6 m)   Wt 203 lb 3.2 oz (92.2 kg)   SpO2 99%   BMI 36.00 kg/m  ,  BMI Body mass index is 36 kg/m. GENERAL:  Well appearing HEENT: Pupils equal round and reactive, fundi not  visualized, oral mucosa unremarkable NECK: No JVP.  Waveform within normal limits, carotid upstroke brisk and symmetric, no bruits, no thyromegaly LUNGS:  Clear to auscultation bilaterally HEART:  RRR.  PMI not displaced or sustained,S1 and S2 within normal limits, no S3, no S4, no clicks, no rubs, no murmurs ABD:  Flat, positive bowel sounds normal in frequency in pitch, no bruits, no rebound, no guarding, no midline pulsatile mass, no hepatomegaly, no splenomegaly EXT:  2 plus pulses throughout, no LE edema bilaterally, no cyanosis no clubbing SKIN:  No rashes no nodules NEURO:  Cranial nerves II through XII grossly intact, motor grossly intact throughout PSYCH:  Cognitively intact, oriented to person place and time   ASSESSMENT/PLAN:    # Hypertension Blood pressure slightly elevated (142/62) despite current medication regimen. Patient reports occasional headaches. Discussed the importance of consistent medication adherence, diet, and exercise. -Increase Spironolactone to 25mg  daily -Continue Valsartan/Hydrochlorothiazide, diltiazem and metoprolol -Check basic metabolic panel in 1 week to assess kidney function and potassium levels after medication adjustment. -Encourage regular exercise and low-salt diet. -Referl to [facility] program for exercise and diet education.  # Paroxysmal Atrial Fibrillation Patient reports occasional chest fluttering but no associated lightheadedness or dizziness. Currently on Eliquis for stroke prevention.  Continue metoprolol and diltiazem.  -Continue Eliquis as prescribed. -Explore patient assistance program for Eliquis due to cost concerns.   # non-obstructive CAD:  # Hyperlipidemia:  Minimal CAD on cath.  LDL goal <70.  Continue rosuvastatin.    General Health Maintenance -Continue Centrum multivitamin and low-dose Biotin as tolerated. -Schedule follow-up appointment in 4 months or sooner if blood pressure increases significantly.      Screening  for Secondary Hypertension:     11/01/2021   10:31 AM  Causes  Drugs/Herbals Screened     - Comments Limits salt and caffeine.  No EtOH.  Rare NSAID use.  Renovascular HTN Screened     - Comments Check renal artery Dopplers  Sleep Apnea Screened     - Comments Snoring and orthopnea.  No other symptoms.  We will get an echo and if it is unremarkable consider screening for sleep study.  Thyroid Disease Screened     - Comments Check TSH  Hyperaldosteronism Not Screened     - Comments Currently on ARB.  Therefore will not start at this time.  Pheochromocytoma Screened     - Comments No symptoms  Cushing's Syndrome Not Screened  Hyperparathyroidism Screened  Coarctation of the Aorta Screened     - Comments BP symmetric  Compliance Screened    Relevant Labs/Studies:    Latest Ref Rng & Units 02/24/2023    9:09 AM 01/24/2023   11:17 AM 01/13/2023   12:32 AM  Basic Labs  Sodium 135 - 145 mmol/L 135  142  137   Potassium 3.5 - 5.1 mmol/L 4.8  5.5  4.5   Creatinine 0.44 - 1.00 mg/dL 6.96  2.95  2.84        Latest Ref Rng & Units 02/24/2023   12:36 PM 09/10/2022    3:54 PM  Thyroid   TSH 0.350 - 4.500 uIU/mL 1.996  1.750      Disposition:    FU with Vittorio Mohs C. Duke Salvia, MD, [facility] in 4 months.  Medication Adjustments/Labs and Tests Ordered: Current medicines are reviewed at length with the patient today.  Concerns regarding medicines  are outlined above.   Orders Placed This Encounter  Procedures   Basic metabolic panel   Meds ordered this encounter  Medications   spironolactone (ALDACTONE) 25 MG tablet    Sig: Take 1 tablet (25 mg total) by mouth daily.    Dispense:  90 tablet    Refill:  30    NEW DOSE, D/C PREVIOUS RX   I,Mathew Stumpf,acting as a scribe for Chilton Si, MD.,have documented all relevant documentation on the behalf of Chilton Si, MD,as directed by  Chilton Si, MD while in the presence of Chilton Si, MD.  I, Todd Argabright C. Duke Salvia, MD have  reviewed all documentation for this visit.  The documentation of the exam, diagnosis, procedures, and orders on 07/17/2023 are all accurate and complete.  Signed, Chilton Si, MD  07/17/2023 10:39 AM    [facility]

## 2023-07-17 ENCOUNTER — Ambulatory Visit (HOSPITAL_BASED_OUTPATIENT_CLINIC_OR_DEPARTMENT_OTHER): Payer: PPO | Admitting: Cardiovascular Disease

## 2023-07-17 ENCOUNTER — Encounter (HOSPITAL_BASED_OUTPATIENT_CLINIC_OR_DEPARTMENT_OTHER): Payer: Self-pay | Admitting: Cardiovascular Disease

## 2023-07-17 VITALS — BP 142/62 | HR 56 | Ht 63.0 in | Wt 203.2 lb

## 2023-07-17 DIAGNOSIS — R040 Epistaxis: Secondary | ICD-10-CM

## 2023-07-17 DIAGNOSIS — E785 Hyperlipidemia, unspecified: Secondary | ICD-10-CM

## 2023-07-17 DIAGNOSIS — I5033 Acute on chronic diastolic (congestive) heart failure: Secondary | ICD-10-CM | POA: Diagnosis not present

## 2023-07-17 DIAGNOSIS — I25118 Atherosclerotic heart disease of native coronary artery with other forms of angina pectoris: Secondary | ICD-10-CM | POA: Diagnosis not present

## 2023-07-17 DIAGNOSIS — I480 Paroxysmal atrial fibrillation: Secondary | ICD-10-CM | POA: Diagnosis not present

## 2023-07-17 DIAGNOSIS — E7800 Pure hypercholesterolemia, unspecified: Secondary | ICD-10-CM

## 2023-07-17 DIAGNOSIS — Z5181 Encounter for therapeutic drug level monitoring: Secondary | ICD-10-CM

## 2023-07-17 DIAGNOSIS — I10 Essential (primary) hypertension: Secondary | ICD-10-CM | POA: Diagnosis not present

## 2023-07-17 DIAGNOSIS — D6859 Other primary thrombophilia: Secondary | ICD-10-CM

## 2023-07-17 DIAGNOSIS — I2510 Atherosclerotic heart disease of native coronary artery without angina pectoris: Secondary | ICD-10-CM | POA: Diagnosis not present

## 2023-07-17 HISTORY — DX: Atherosclerotic heart disease of native coronary artery without angina pectoris: I25.10

## 2023-07-17 MED ORDER — SPIRONOLACTONE 25 MG PO TABS: 25.0000 mg | ORAL_TABLET | Freq: Every day | ORAL | 30 refills | Status: DC

## 2023-07-17 NOTE — Patient Instructions (Signed)
Medication Instructions:  INCREASE YOUR SPIRONOLACTONE TO FULL TABLET DAILY   Labwork: BMET IN 1 WEEK   Testing/Procedures: NONE  Follow-Up: 4 MONTHS WITH EITHER DR [place] OR CAITLIN W NP IN ADV HTN CLINICE   Any Other Special Instructions Will Be Listed Below (If Applicable).  YOU HAVE BEEN REFERRED PREP PROGRAM AT THE [facility]. IF YOU DO NOT HEAR FROM [facility] OR [facility] IN 2 WEEKS CALL THE OFFICE TO FOLLOW UP   WORK ON INCREASING YOUR EXERCISE   If you need a refill on your cardiac medications before your next appointment, please call your pharmacy.

## 2023-07-18 ENCOUNTER — Ambulatory Visit (HOSPITAL_BASED_OUTPATIENT_CLINIC_OR_DEPARTMENT_OTHER)
Admission: RE | Admit: 2023-07-18 | Discharge: 2023-07-18 | Disposition: A | Discharge status: Home / Self Care | Payer: PPO | Source: Ambulatory Visit | Attending: Family | Admitting: Family

## 2023-07-18 ENCOUNTER — Telehealth: Payer: Self-pay | Admitting: Family

## 2023-07-18 DIAGNOSIS — Z13820 Encounter for screening for osteoporosis: Secondary | ICD-10-CM | POA: Diagnosis not present

## 2023-07-18 DIAGNOSIS — Z1231 Encounter for screening mammogram for malignant neoplasm of breast: Secondary | ICD-10-CM | POA: Diagnosis not present

## 2023-07-18 DIAGNOSIS — Z780 Asymptomatic menopausal state: Secondary | ICD-10-CM | POA: Diagnosis not present

## 2023-07-18 DIAGNOSIS — M85851 Other specified disorders of bone density and structure, right thigh: Secondary | ICD-10-CM | POA: Diagnosis not present

## 2023-07-18 NOTE — Telephone Encounter (Signed)
Patient stated next Thursday at her appointment at [facility] they can do her bloodwork because she couldn't get it here due to them not being able to draw the labs, they told her an order would need to be placed so they can stick her one time.

## 2023-07-23 ENCOUNTER — Other Ambulatory Visit: Payer: Self-pay | Admitting: Family

## 2023-07-23 DIAGNOSIS — R928 Other abnormal and inconclusive findings on diagnostic imaging of breast: Secondary | ICD-10-CM

## 2023-07-23 DIAGNOSIS — E1165 Type 2 diabetes mellitus with hyperglycemia: Secondary | ICD-10-CM

## 2023-07-23 DIAGNOSIS — M8580 Other specified disorders of bone density and structure, unspecified site: Secondary | ICD-10-CM

## 2023-07-23 NOTE — Telephone Encounter (Signed)
Complete

## 2023-07-25 DIAGNOSIS — I10 Essential (primary) hypertension: Secondary | ICD-10-CM | POA: Diagnosis not present

## 2023-07-25 DIAGNOSIS — E1165 Type 2 diabetes mellitus with hyperglycemia: Secondary | ICD-10-CM | POA: Diagnosis not present

## 2023-07-25 DIAGNOSIS — Z5181 Encounter for therapeutic drug level monitoring: Secondary | ICD-10-CM | POA: Diagnosis not present

## 2023-07-25 LAB — BASIC METABOLIC PANEL
BUN/Creatinine Ratio: 22 (ref 12–28)
BUN: 29 mg/dL — ABNORMAL HIGH (ref 8–27)
CO2: 19 mmol/L — ABNORMAL LOW (ref 20–29)
Calcium: 10 mg/dL (ref 8.7–10.3)
Chloride: 103 mmol/L (ref 96–106)
Creatinine, Ser: 1.3 mg/dL — ABNORMAL HIGH (ref 0.57–1.00)
Glucose: 254 mg/dL — ABNORMAL HIGH (ref 70–99)
Potassium: 4.9 mmol/L (ref 3.5–5.2)
Sodium: 138 mmol/L (ref 134–144)
eGFR: 42 mL/min/{1.73_m2} — ABNORMAL LOW (ref >59)

## 2023-07-26 ENCOUNTER — Other Ambulatory Visit: Payer: Self-pay | Admitting: Family

## 2023-07-26 DIAGNOSIS — N1832 Chronic kidney disease, stage 3b: Secondary | ICD-10-CM

## 2023-07-26 DIAGNOSIS — E1165 Type 2 diabetes mellitus with hyperglycemia: Secondary | ICD-10-CM

## 2023-07-26 LAB — BASIC METABOLIC PANEL
BUN/Creatinine Ratio: 24 (ref 12–28)
BUN: 31 mg/dL — ABNORMAL HIGH (ref 8–27)
CO2: 22 mmol/L (ref 20–29)
Calcium: 9.8 mg/dL (ref 8.7–10.3)
Chloride: 103 mmol/L (ref 96–106)
Creatinine, Ser: 1.3 mg/dL — ABNORMAL HIGH (ref 0.57–1.00)
Glucose: 248 mg/dL — ABNORMAL HIGH (ref 70–99)
Potassium: 4.9 mmol/L (ref 3.5–5.2)
Sodium: 137 mmol/L (ref 134–144)
eGFR: 42 mL/min/{1.73_m2} — ABNORMAL LOW (ref >59)

## 2023-07-26 LAB — HEMOGLOBIN A1C
Est. average glucose Bld gHb Est-mCnc: 246 mg/dL
Hgb A1c MFr Bld: 10.2 % — ABNORMAL HIGH (ref 4.8–5.6)

## 2023-07-26 MED ORDER — SITAGLIPTIN PHOSPHATE 25 MG PO TABS: 25.0000 mg | ORAL_TABLET | Freq: Every day | ORAL | 1 refills | Status: DC

## 2023-08-02 ENCOUNTER — Telehealth: Payer: Self-pay | Admitting: Cardiovascular Disease

## 2023-08-02 NOTE — Telephone Encounter (Signed)
Would not expect this to be related to her cardiac medications. Description is atypical for medication related rash. Would recommend visit with PCP for evaluation. Concern as she can't see the rash and it has bumps could reflect possible shingles but difficult to know without seeing it.   Alver Sorrow, NP

## 2023-08-02 NOTE — Telephone Encounter (Signed)
Discussed with patient and she will go to [facility] tomorrow morning, son has her car now so unable to go

## 2023-08-02 NOTE — Telephone Encounter (Signed)
Pt states that she noticed a week ago that a rash on her lower and upper back as well as she's been experiencing itching and burning. Pt thinks that it may ne do to a medication but is not sure. She would like a c/b regarding this matter. Please advise

## 2023-08-02 NOTE — Telephone Encounter (Signed)
Spoke with patient regarding rash Stated rash seem to be dry, in crease of back going down to waistline moving to side Itching and burning  Does not think she has blisters but can not tell for sure, feels like patches of bumps  Now new medications (has not picked up Jardiance)  Spironolactone increased to full tablet at las visit  Did start using new body wash couple of week ago but the back is only place there is rash.  She stated she itches at times in different places but once scratches area itching subsides.  This is not the case with the rash area She is concerned coming from medications  Will forward [facility] for review

## 2023-08-12 DIAGNOSIS — J45909 Unspecified asthma, uncomplicated: Secondary | ICD-10-CM | POA: Diagnosis not present

## 2023-08-13 ENCOUNTER — Other Ambulatory Visit: Payer: Self-pay

## 2023-08-13 ENCOUNTER — Inpatient Hospital Stay (HOSPITAL_COMMUNITY)
Admission: EM | Admit: 2023-08-13 | Discharge: 2023-08-17 | DRG: 309 | Disposition: A | Discharge status: Home / Self Care | Payer: PPO | Attending: Family Medicine | Admitting: Family Medicine

## 2023-08-13 ENCOUNTER — Encounter (HOSPITAL_COMMUNITY): Payer: Self-pay

## 2023-08-13 ENCOUNTER — Telehealth: Payer: Self-pay | Admitting: Family

## 2023-08-13 ENCOUNTER — Emergency Department (HOSPITAL_COMMUNITY): Payer: PPO

## 2023-08-13 DIAGNOSIS — I1A0 Resistant hypertension: Secondary | ICD-10-CM | POA: Diagnosis not present

## 2023-08-13 DIAGNOSIS — E785 Hyperlipidemia, unspecified: Secondary | ICD-10-CM | POA: Diagnosis present

## 2023-08-13 DIAGNOSIS — Z7901 Long term (current) use of anticoagulants: Secondary | ICD-10-CM | POA: Diagnosis not present

## 2023-08-13 DIAGNOSIS — E1165 Type 2 diabetes mellitus with hyperglycemia: Secondary | ICD-10-CM | POA: Diagnosis not present

## 2023-08-13 DIAGNOSIS — Z8249 Family history of ischemic heart disease and other diseases of the circulatory system: Secondary | ICD-10-CM | POA: Diagnosis not present

## 2023-08-13 DIAGNOSIS — I4589 Other specified conduction disorders: Secondary | ICD-10-CM | POA: Diagnosis not present

## 2023-08-13 DIAGNOSIS — I480 Paroxysmal atrial fibrillation: Secondary | ICD-10-CM | POA: Diagnosis not present

## 2023-08-13 DIAGNOSIS — R42 Dizziness and giddiness: Secondary | ICD-10-CM | POA: Diagnosis not present

## 2023-08-13 DIAGNOSIS — Z79899 Other long term (current) drug therapy: Secondary | ICD-10-CM | POA: Diagnosis not present

## 2023-08-13 DIAGNOSIS — I4891 Unspecified atrial fibrillation: Secondary | ICD-10-CM | POA: Diagnosis present

## 2023-08-13 DIAGNOSIS — I110 Hypertensive heart disease with heart failure: Secondary | ICD-10-CM | POA: Diagnosis not present

## 2023-08-13 DIAGNOSIS — E875 Hyperkalemia: Secondary | ICD-10-CM

## 2023-08-13 DIAGNOSIS — I10 Essential (primary) hypertension: Secondary | ICD-10-CM | POA: Diagnosis not present

## 2023-08-13 DIAGNOSIS — Z7951 Long term (current) use of inhaled steroids: Secondary | ICD-10-CM | POA: Diagnosis not present

## 2023-08-13 DIAGNOSIS — I5032 Chronic diastolic (congestive) heart failure: Secondary | ICD-10-CM | POA: Diagnosis present

## 2023-08-13 DIAGNOSIS — I2510 Atherosclerotic heart disease of native coronary artery without angina pectoris: Secondary | ICD-10-CM | POA: Diagnosis not present

## 2023-08-13 DIAGNOSIS — I4892 Unspecified atrial flutter: Secondary | ICD-10-CM | POA: Diagnosis not present

## 2023-08-13 DIAGNOSIS — J45909 Unspecified asthma, uncomplicated: Secondary | ICD-10-CM | POA: Diagnosis present

## 2023-08-13 DIAGNOSIS — I5030 Unspecified diastolic (congestive) heart failure: Secondary | ICD-10-CM | POA: Diagnosis not present

## 2023-08-13 DIAGNOSIS — I130 Hypertensive heart and chronic kidney disease with heart failure and stage 1 through stage 4 chronic kidney disease, or unspecified chronic kidney disease: Secondary | ICD-10-CM | POA: Diagnosis not present

## 2023-08-13 DIAGNOSIS — Z90710 Acquired absence of both cervix and uterus: Secondary | ICD-10-CM | POA: Diagnosis not present

## 2023-08-13 DIAGNOSIS — I483 Typical atrial flutter: Secondary | ICD-10-CM

## 2023-08-13 DIAGNOSIS — R55 Syncope and collapse: Principal | ICD-10-CM | POA: Diagnosis present

## 2023-08-13 DIAGNOSIS — N1831 Chronic kidney disease, stage 3a: Secondary | ICD-10-CM | POA: Diagnosis present

## 2023-08-13 DIAGNOSIS — E1169 Type 2 diabetes mellitus with other specified complication: Secondary | ICD-10-CM

## 2023-08-13 DIAGNOSIS — Z888 Allergy status to other drugs, medicaments and biological substances status: Secondary | ICD-10-CM

## 2023-08-13 DIAGNOSIS — Z833 Family history of diabetes mellitus: Secondary | ICD-10-CM

## 2023-08-13 DIAGNOSIS — Z7984 Long term (current) use of oral hypoglycemic drugs: Secondary | ICD-10-CM | POA: Diagnosis not present

## 2023-08-13 DIAGNOSIS — T500X5A Adverse effect of mineralocorticoids and their antagonists, initial encounter: Secondary | ICD-10-CM | POA: Diagnosis not present

## 2023-08-13 DIAGNOSIS — M545 Low back pain, unspecified: Secondary | ICD-10-CM | POA: Diagnosis present

## 2023-08-13 DIAGNOSIS — E1122 Type 2 diabetes mellitus with diabetic chronic kidney disease: Secondary | ICD-10-CM | POA: Diagnosis present

## 2023-08-13 DIAGNOSIS — E119 Type 2 diabetes mellitus without complications: Secondary | ICD-10-CM

## 2023-08-13 DIAGNOSIS — N179 Acute kidney failure, unspecified: Secondary | ICD-10-CM | POA: Insufficient documentation

## 2023-08-13 DIAGNOSIS — R000 Tachycardia, unspecified: Secondary | ICD-10-CM | POA: Diagnosis not present

## 2023-08-13 DIAGNOSIS — I361 Nonrheumatic tricuspid (valve) insufficiency: Secondary | ICD-10-CM | POA: Diagnosis not present

## 2023-08-13 LAB — BASIC METABOLIC PANEL
Anion gap: 10 (ref 5–15)
Anion gap: 13 (ref 5–15)
BUN: 29 mg/dL — ABNORMAL HIGH (ref 8–23)
BUN: 30 mg/dL — ABNORMAL HIGH (ref 8–23)
CO2: 19 mmol/L — ABNORMAL LOW (ref 22–32)
CO2: 18 mmol/L — ABNORMAL LOW (ref 22–32)
Calcium: 9.5 mg/dL (ref 8.9–10.3)
Calcium: 9.4 mg/dL (ref 8.9–10.3)
Chloride: 106 mmol/L (ref 98–111)
Chloride: 105 mmol/L (ref 98–111)
Creatinine, Ser: 1.37 mg/dL — ABNORMAL HIGH (ref 0.44–1.00)
Creatinine, Ser: 1.44 mg/dL — ABNORMAL HIGH (ref 0.44–1.00)
GFR, Estimated: 39 mL/min — ABNORMAL LOW (ref >60)
GFR, Estimated: 37 mL/min — ABNORMAL LOW (ref >60)
Glucose, Bld: 252 mg/dL — ABNORMAL HIGH (ref 70–99)
Glucose, Bld: 212 mg/dL — ABNORMAL HIGH (ref 70–99)
Potassium: 5.5 mmol/L — ABNORMAL HIGH (ref 3.5–5.1)
Potassium: 5.1 mmol/L (ref 3.5–5.1)
Sodium: 135 mmol/L (ref 135–145)
Sodium: 136 mmol/L (ref 135–145)

## 2023-08-13 LAB — CBC
HCT: 37.4 % (ref 36.0–46.0)
Hemoglobin: 11.9 g/dL — ABNORMAL LOW (ref 12.0–15.0)
MCH: 27.6 pg (ref 26.0–34.0)
MCHC: 31.8 g/dL (ref 30.0–36.0)
MCV: 86.8 fL (ref 80.0–100.0)
Platelets: 303 10*3/uL (ref 150–400)
RBC: 4.31 MIL/uL (ref 3.87–5.11)
RDW: 13.9 % (ref 11.5–15.5)
WBC: 9.1 10*3/uL (ref 4.0–10.5)
nRBC: 0 % (ref 0.0–0.2)

## 2023-08-13 LAB — CBG MONITORING, ED
Glucose-Capillary: 234 mg/dL — ABNORMAL HIGH (ref 70–99)
Glucose-Capillary: 194 mg/dL — ABNORMAL HIGH (ref 70–99)

## 2023-08-13 LAB — TROPONIN I (HIGH SENSITIVITY)
Troponin I (High Sensitivity): 10 ng/L (ref <18)
Troponin I (High Sensitivity): 10 ng/L (ref <18)

## 2023-08-13 LAB — TSH: TSH: 3.369 u[IU]/mL (ref 0.350–4.500)

## 2023-08-13 LAB — BRAIN NATRIURETIC PEPTIDE: B Natriuretic Peptide: 171.2 pg/mL — ABNORMAL HIGH (ref 0.0–100.0)

## 2023-08-13 LAB — MAGNESIUM: Magnesium: 1.8 mg/dL (ref 1.7–2.4)

## 2023-08-13 LAB — GLUCOSE, CAPILLARY: Glucose-Capillary: 221 mg/dL — ABNORMAL HIGH (ref 70–99)

## 2023-08-13 MED ORDER — INSULIN ASPART 100 UNIT/ML IV SOLN
5.0000 [IU] | Freq: Once | INTRAVENOUS | Status: AC
  Administered 2023-08-13: 5 [IU] via INTRAVENOUS

## 2023-08-13 MED ORDER — METOPROLOL TARTRATE 25 MG PO TABS: 12.5000 mg | ORAL_TABLET | Freq: Two times a day (BID) | ORAL | Status: DC

## 2023-08-13 MED ORDER — CETAPHIL MOISTURIZING EX LOTN
TOPICAL_LOTION | Freq: Every day | CUTANEOUS | Status: DC
  Filled 2023-08-13 (×2): qty 473

## 2023-08-13 MED ORDER — DILTIAZEM HCL-DEXTROSE 125-5 MG/125ML-% IV SOLN (PREMIX)
5.0000 mg/h | INTRAVENOUS | Status: AC
  Administered 2023-08-13: 5 mg/h via INTRAVENOUS
  Filled 2023-08-13 (×2): qty 125

## 2023-08-13 MED ORDER — METOPROLOL SUCCINATE ER 25 MG PO TB24: 25.0000 mg | ORAL_TABLET | Freq: Every day | ORAL | Status: DC

## 2023-08-13 MED ORDER — FUROSEMIDE 10 MG/ML IJ SOLN
40.0000 mg | Freq: Once | INTRAMUSCULAR | Status: AC
  Administered 2023-08-13: 40 mg via INTRAVENOUS
  Filled 2023-08-13: qty 4

## 2023-08-13 MED ORDER — LORATADINE 10 MG PO TABS
10.0000 mg | ORAL_TABLET | Freq: Every day | ORAL | Status: DC
  Administered 2023-08-14 – 2023-08-17 (×3): 10 mg via ORAL
  Filled 2023-08-13 (×3): qty 1

## 2023-08-13 MED ORDER — INSULIN GLARGINE-YFGN 100 UNIT/ML ~~LOC~~ SOLN
5.0000 [IU] | Freq: Every day | SUBCUTANEOUS | Status: DC
  Administered 2023-08-13 – 2023-08-14 (×2): 5 [IU] via SUBCUTANEOUS
  Filled 2023-08-13 (×4): qty 0.05

## 2023-08-13 MED ORDER — DILTIAZEM LOAD VIA INFUSION
10.0000 mg | Freq: Once | INTRAVENOUS | Status: AC
  Administered 2023-08-13: 10 mg via INTRAVENOUS
  Filled 2023-08-13: qty 10

## 2023-08-13 MED ORDER — APIXABAN 5 MG PO TABS
5.0000 mg | ORAL_TABLET | Freq: Two times a day (BID) | ORAL | Status: DC
  Administered 2023-08-13: 5 mg via ORAL
  Filled 2023-08-13: qty 1

## 2023-08-13 MED ORDER — SODIUM ZIRCONIUM CYCLOSILICATE 10 G PO PACK
10.0000 g | PACK | Freq: Once | ORAL | Status: AC
  Administered 2023-08-13: 10 g via ORAL
  Filled 2023-08-13: qty 1

## 2023-08-13 MED ORDER — APIXABAN 5 MG PO TABS
5.0000 mg | ORAL_TABLET | Freq: Two times a day (BID) | ORAL | Status: DC
  Administered 2023-08-13 – 2023-08-17 (×8): 5 mg via ORAL
  Filled 2023-08-13 (×8): qty 1

## 2023-08-13 MED ORDER — INSULIN ASPART 100 UNIT/ML IJ SOLN
0.0000 [IU] | Freq: Three times a day (TID) | INTRAMUSCULAR | Status: DC
  Administered 2023-08-13 – 2023-08-14 (×2): 2 [IU] via SUBCUTANEOUS
  Administered 2023-08-14: 5 [IU] via SUBCUTANEOUS
  Administered 2023-08-15 (×2): 3 [IU] via SUBCUTANEOUS
  Administered 2023-08-15 – 2023-08-16 (×3): 2 [IU] via SUBCUTANEOUS
  Administered 2023-08-16: 3 [IU] via SUBCUTANEOUS

## 2023-08-13 MED ORDER — AQUAPHOR EX OINT: TOPICAL_OINTMENT | CUTANEOUS | Status: DC | PRN

## 2023-08-13 MED ORDER — FLUTICASONE PROPIONATE 50 MCG/ACT NA SUSP: 2.0000 | Freq: Every day | NASAL | Status: DC | PRN

## 2023-08-13 MED ORDER — ROSUVASTATIN CALCIUM 5 MG PO TABS
5.0000 mg | ORAL_TABLET | Freq: Every day | ORAL | Status: DC
  Administered 2023-08-14 – 2023-08-17 (×4): 5 mg via ORAL
  Filled 2023-08-13 (×4): qty 1

## 2023-08-13 NOTE — ED Triage Notes (Signed)
Pt BIB [facility] with near syncopal episode after showering this morning. Pt reports loss of consciousness after sitting on bed for few minutes. HR per EMS 140, Hx of a-fib. HR 147 in triage. Pt reports light-headed and feels "something stuck in my chest". Endorses nausea but no emesis.

## 2023-08-13 NOTE — Plan of Care (Signed)

## 2023-08-13 NOTE — Telephone Encounter (Signed)
Were you trying to reach pt ?

## 2023-08-13 NOTE — Assessment & Plan Note (Addendum)
Patient with history of paroxysmal A-fib on Eliquis 5 mg twice daily, Metoprolol 25 mg at bedtime, Diltiazem 120 mg daily.  Of note, patient was admitted to the hospital in June 2024 with very similar symptoms and was in A-fib with RVR at that time.  Had cardiac cath with mild CAD and echo with EF 65 to 70% and otherwise unremarkable.    Follows with Dr. Duke Salvia for cardiology. Currently on diltiazem drip, on exam heart rate fluctuated between 1 teens-140s - Admitted to [facility], Dr. Miquel Dunn attending, MedSurg - Continue home Eliquis 5 mg twice daily - IV diltiazem drip - Continue home metoprolol succinate 25 mg at bedtime - Cardiac telemetry - Resume home diltiazem once rate is consistently controlled on drip - Vital signs per floor protocol - echo ordered - AM BMP, CBC, TSH, BNP

## 2023-08-13 NOTE — Hospital Course (Addendum)
Rachel [name] is a 82 y.o.female with a history of pAfib on eliquis, HFpEF, CAD, T2DM, HLD, HTN who was admitted to the [facility] Medicine Teaching Service at [facility] for presyncope in the setting of A-fib with RVR. Her hospital course is detailed below:  Atrial fibrillation with RVR History of paroxysmal A-fib on Eliquis 5, Toprol 25, diltiazem 120.  Recent hospitalization in June with similar presentation.  Started on diltiazem drip with improvement in heart rate and transitioned to p.o. diltiazem.  Despite rate control, patient remained in atrial flutter persistently.  Did have another episode of weakness and chest pain when she got out of bed.  Cardiology was consulted and performed TEE/DCCV which patient tolerated well.  Presyncope Near syncope while sitting.  No fall.  Etiology likely A-fib with RVR.  Normal neuroexam, negative troponins, negative chest x-ray, CBC unremarkable, slightly elevated creatinine but not AKI initially.  Improved with rate control.  Hyperkalemia/AKI 5.5.  Likely secondary to spironolactone increase recently.  No EKG changes.  Held valssartan-HCTZ and given Lokelma, Lovenox, Lasix with improvement.  Patient then developed AKI with creatinine 2 on her second day of admission, this is thought to be from the dose of Lasix she received.  Creatinine trended back down afterwards.  Type 2 diabetes mellitus Previously started on Januvia but patient has not filled.  Management insulin while inpatient. Sugar in 200s, managed with sensitive sliding scale and Semglee 12 units daily. *** at discharge.   Other chronic conditions were medically managed with home medications and formulary alternatives as necessary (hypertension, HLD/CAD)  PCP Follow-up Recommendations: Follow-up with cardiology for A-fib management Optimize DM medications, assess for medication adherence

## 2023-08-13 NOTE — ED Provider Notes (Signed)
EMERGENCY DEPARTMENT AT [facility] Provider Note   CSN: [id number] Arrival date & time: 08/13/23  1129     History  Chief Complaint  Patient presents with   Near Syncope    Rachel [name] is a 82 y.o. female.   Near Syncope Associated symptoms include chest pain and shortness of breath.  Patient presents for near syncope.  Medical history includes DM, HLD, arthritis, HTN, CHF, CAD, PAF.  She is prescribed Eliquis.  She did not take her morning medications today but denies any missed doses of Eliquis over the past several weeks.  This morning, she woke up in her normal state of health.  As she was getting ready and making breakfast, she had dizziness, lightheadedness, and chest discomfort.  She has had some nausea.  Symptoms have persisted, and are worsened with standing.     Home Medications Prior to Admission medications   Medication Sig Start Date End Date Taking? Authorizing Provider  acetaminophen (TYLENOL) 500 MG tablet Take 500 mg by mouth every 8 (eight) hours as needed for mild pain or headache.   Yes [provider]  albuterol (VENTOLIN HFA) 108 (90 Base) MCG/ACT inhaler INHALE 1-2 PUFFS BY MOUTH EVERY 6 HOURS AS NEEDED FOR WHEEZE OR SHORTNESS OF BREATH Patient taking differently: Inhale 1-2 puffs into the lungs every 6 (six) hours as needed for wheezing or shortness of breath. 10/25/22  Yes Zonia Kief, Amy J, NP  apixaban (ELIQUIS) 5 MG TABS tablet Take 1 tablet (5 mg total) by mouth 2 (two) times daily. 03/08/23 09/04/23 Yes Alver Sorrow, NP  budesonide (PULMICORT) 0.25 MG/2ML nebulizer solution Take 2 mLs (0.25 mg total) by nebulization 2 (two) times daily. 03/04/23  Yes Zonia Kief, Amy J, NP  diclofenac Sodium (VOLTAREN) 1 % GEL Apply 2 g topically 4 (four) times daily as needed. 11/01/21  Yes Chilton Si, MD  diltiazem (CARDIZEM CD) 120 MG 24 hr capsule Take 1 capsule (120 mg total) by mouth daily. 03/08/23 08/13/23 Yes  Alver Sorrow, NP  fluticasone (FLONASE) 50 MCG/ACT nasal spray Place 2 sprays into both nostrils daily as needed for allergies or rhinitis.   Yes [provider]  ipratropium (ATROVENT HFA) 17 MCG/ACT inhaler Inhale 1 puff into the lungs 3 (three) times daily. 02/24/23 02/24/24 Yes Zhang, Renae Fickle, MD  loratadine (CLARITIN) 10 MG tablet TAKE 1 TABLET BY MOUTH EVERY DAY (DRUG NOT COVERED BY INS) Patient taking differently: Take 10 mg by mouth daily. 05/20/23  Yes Zonia Kief, Amy J, NP  metoprolol succinate (TOPROL-XL) 25 MG 24 hr tablet Take 1 tablet (25 mg total) by mouth at bedtime. 03/08/23 03/02/24 Yes Alver Sorrow, NP  Multiple Vitamins-Minerals (CENTRUM SILVER 50+WOMEN) TABS Take 1 tablet by mouth daily.   Yes [provider]  nystatin (MYCOSTATIN/NYSTOP) powder Apply topically 4 (four) times daily. 03/17/19  Yes Burns, Bobette Mo, MD  rosuvastatin (CRESTOR) 5 MG tablet Take 1 tablet (5 mg total) by mouth daily. 03/08/23 08/13/23 Yes Alver Sorrow, NP  spironolactone (ALDACTONE) 25 MG tablet Take 1 tablet (25 mg total) by mouth daily. 07/17/23 08/16/23 Yes Chilton Si, MD  valsartan-hydrochlorothiazide (DIOVAN-HCT) 320-25 MG tablet Take 1 tablet by mouth daily. 01/17/23  Yes Chilton Si, MD  glucose blood [facility] VERIO) test strip Use to check blood sugars once a day 06/15/22   Georganna Skeans, MD  OneTouch Delica Lancets 30G MISC E11.9 06/15/22   Georganna Skeans, MD  pantoprazole (PROTONIX) 40 MG tablet Take 1 tablet (  40 mg total) by mouth daily. Patient not taking: Reported on 08/13/2023 02/28/23 03/30/23  Lanae Boast, MD  sitaGLIPtin (JANUVIA) 25 MG tablet Take 1 tablet (25 mg total) by mouth daily. Patient not taking: Reported on 08/13/2023 07/26/23   Rema Fendt, NP      Allergies    Aspirin, Atorvastatin, Codeine sulfate, Glimepiride, Metformin, Prandin [repaglinide], and Propoxyphene n-acetaminophen    Review of Systems   Review of Systems  Constitutional:   Positive for fatigue.  Respiratory:  Positive for shortness of breath.   Cardiovascular:  Positive for chest pain and near-syncope.  Gastrointestinal:  Positive for nausea.  Neurological:  Positive for dizziness and light-headedness.  All other systems reviewed and are negative.   Physical Exam Updated Vital Signs BP (!) 141/79   Pulse 98   Temp 97.7 F (36.5 C) (Oral)   Resp 20   Ht 5\' 3"  (1.6 m)   Wt 92 kg   SpO2 100%   BMI 35.93 kg/m  Physical Exam Vitals and nursing note reviewed.  Constitutional:      General: She is not in acute distress.    Appearance: Normal appearance. She is well-developed. She is not ill-appearing, toxic-appearing or diaphoretic.  HENT:     Head: Normocephalic and atraumatic.     Right Ear: External ear normal.     Left Ear: External ear normal.     Nose: Nose normal.     Mouth/Throat:     Mouth: Mucous membranes are moist.  Eyes:     Extraocular Movements: Extraocular movements intact.     Conjunctiva/sclera: Conjunctivae normal.  Cardiovascular:     Rate and Rhythm: Tachycardia present. Rhythm irregular.  Pulmonary:     Effort: Pulmonary effort is normal. No respiratory distress.  Abdominal:     General: There is no distension.     Palpations: Abdomen is soft.  Musculoskeletal:        General: No swelling. Normal range of motion.     Cervical back: Normal range of motion and neck supple.  Skin:    General: Skin is warm and dry.     Coloration: Skin is not jaundiced or pale.  Neurological:     General: No focal deficit present.     Mental Status: She is alert and oriented to person, place, and time.  Psychiatric:        Mood and Affect: Mood normal.        Behavior: Behavior normal.     ED Results / Procedures / Treatments   Labs (all labs ordered are listed, but only abnormal results are displayed) Labs Reviewed  BASIC METABOLIC PANEL - Abnormal; Notable for the following components:      Result Value   Potassium 5.5 (*)     CO2 19 (*)    Glucose, Bld 252 (*)    BUN 29 (*)    Creatinine, Ser 1.37 (*)    GFR, Estimated 39 (*)    All other components within normal limits  CBC - Abnormal; Notable for the following components:   Hemoglobin 11.9 (*)    All other components within normal limits  CBG MONITORING, ED - Abnormal; Notable for the following components:   Glucose-Capillary 234 (*)    All other components within normal limits  MAGNESIUM  TROPONIN I (HIGH SENSITIVITY)  TROPONIN I (HIGH SENSITIVITY)    EKG EKG Interpretation Date/Time:  Tuesday August 13 2023 11:19:35 EST Ventricular Rate:  148 PR Interval:    QRS  Duration:  86 QT Interval:  304 QTC Calculation: 477 R Axis:   55  Text Interpretation: Atrial flutter with 2:1 A-V conduction Nonspecific ST abnormality Abnormal ECG Confirmed by Gloris Manchester [phone]) on 08/13/2023 1:16:59 PM  Radiology No results found.  Procedures Procedures    Medications Ordered in ED Medications  apixaban (ELIQUIS) tablet 5 mg (5 mg Oral Given 08/13/23 1351)  diltiazem (CARDIZEM) 1 mg/mL load via infusion 10 mg (10 mg Intravenous Bolus from Bag 08/13/23 1213)    And  diltiazem (CARDIZEM) 125 mg in dextrose 5% 125 mL (1 mg/mL) infusion (5 mg/hr Intravenous New Bag/Given 08/13/23 1213)  furosemide (LASIX) injection 40 mg (40 mg Intravenous Given 08/13/23 1502)  sodium zirconium cyclosilicate (LOKELMA) packet 10 g (10 g Oral Given 08/13/23 1458)  insulin aspart (novoLOG) injection 5 Units (5 Units Intravenous Given 08/13/23 1500)    ED Course/ Medical Decision Making/ A&P                                 Medical Decision Making Amount and/or Complexity of Data Reviewed Labs: ordered.  Risk OTC drugs. Prescription drug management. Decision regarding hospitalization.   This patient presents to the ED for concern of near syncope, this involves an extensive number of treatment options, and is a complaint that carries with it a high risk of complications  and morbidity.  The differential diagnosis includes arrhythmia, vasovagal episode, dehydration, anemia, metabolic derangements   Co morbidities that complicate the patient evaluation  DM, HLD, arthritis, HTN, CHF, CAD, PAF   Additional history obtained:  Additional history obtained from N/A External records from outside source obtained and reviewed including EMR   Lab Tests:  I Ordered, and personally interpreted labs.  The pertinent results include: Baseline anemia, no leukocytosis, normal troponin, hyperkalemia with otherwise normal electrolytes, baseline creatinine   Imaging Studies ordered:  I ordered imaging studies including wrist x-ray I independently visualized and interpreted imaging which showed (pending at time of signout) I agree with the radiologist interpretation   Cardiac Monitoring: / EKG:  The patient was maintained on a cardiac monitor.  I personally viewed and interpreted the cardiac monitored which showed an underlying rhythm of: Atrial fibrillation   Problem List / ED Course / Critical interventions / Medication management  Patient presenting for near syncopal symptoms starting this morning.  On arrival, she is found to be tachycardic with likely atrial flutter with 2:1 conduction on EKG.  She does have a history of PAF and is on Eliquis.  She denies any recent missed doses.  Patient was placed on bedside cardiac monitor.  Lab work was initiated.  Patient has no history of prior DCCV.  She is hesitant to undergo this procedure today.  Morning dose of Eliquis was ordered.  Will attempt rate control with medication.  Diltiazem load and gtt. initiated.  Patient's lab work was notable for mild hyperkalemia.  Lasix, Lokelma, and insulin were ordered.  Heart rate improved on diltiazem gtt.  Patient to be admitted for further management. I ordered medication including diltiazem for control; Lokelma, insulin, Lasix for hyperkalemia Reevaluation of the patient after  these medicines showed that the patient improved I have reviewed the patients home medicines and have made adjustments as needed   Social Determinants of Health:  Lives independently  CRITICAL CARE Performed by: Gloris Manchester   Total critical care time: 34 minutes  Critical care time was exclusive of separately  billable procedures and treating other patients.  Critical care was necessary to treat or prevent imminent or life-threatening deterioration.  Critical care was time spent personally by me on the following activities: development of treatment plan with patient and/or surrogate as well as nursing, discussions with consultants, evaluation of patient's response to treatment, examination of patient, obtaining history from patient or surrogate, ordering and performing treatments and interventions, ordering and review of laboratory studies, ordering and review of radiographic studies, pulse oximetry and re-evaluation of patient's condition.        Final Clinical Impression(s) / ED Diagnoses Final diagnoses:  Near syncope  Atrial fibrillation with RVR (HCC)  Hyperkalemia    Rx / DC Orders ED Discharge Orders          Ordered    Amb referral to [facility]        08/13/23 1146              Gloris Manchester, MD 08/13/23 1510

## 2023-08-13 NOTE — Telephone Encounter (Signed)
Copied from CRM [id number]. Topic: General - Other >> Aug 13, 2023  9:20 AM Macon Large wrote: Reason for CRM: Pt stated that she was returning call from a missed call she received from the office.

## 2023-08-13 NOTE — Plan of Care (Signed)
FMTS Interim Progress Note Rachel [name] is a 82 y.o. female admitted for symptomatic A-fib with RVR.   S: Patient reports that her presyncopal symptoms such as lightheadedness have improved since admission.  Reports that her back was dry was asking for cream.  No other concerns with patient.  O: BP (!) 134/58 (BP Location: Right Arm)   Pulse 85   Temp 98.1 F (36.7 C) (Oral)   Resp 20   Ht 5\' 3"  (1.6 m)   Wt 92 kg   SpO2 99%   BMI 35.93 kg/m    Telemetry: Atrial flutter rhythm, normal rate  Physical Exam Constitutional:      Comments: Laying in bed comfortably no acute distress with granddaughter at bedside.  Cardiovascular:     Rate and Rhythm: Normal rate and regular rhythm.     Heart sounds: Normal heart sounds.  Pulmonary:     Effort: Pulmonary effort is normal.     Breath sounds: Normal breath sounds.  Abdominal:     General: Abdomen is flat.     Palpations: Abdomen is soft.     Tenderness: There is no abdominal tenderness.  Skin:    General: Skin is warm and dry.  Neurological:     General: No focal deficit present.    A/P: Since being started on diltiazem drip patient's symptoms have improved but on telemetry has atrial flutter, so we will continue diltiazem drip.  Patient asking for oitment and hospital did not have previous oitment ordered so will put in one on formulary.  Continue diltiazem ggt Continue telemetry Repeat EKG D/c cetaphil, start aquaphor for dry skin on back Transition to oral diltiazem once in sinus rhythm  Meryl Dare, MD 08/13/2023, 9:57 PM PGY-1, [facility] pager [phone]

## 2023-08-13 NOTE — Assessment & Plan Note (Addendum)
Januvia 25 mg daily on medication list, however it appears this was a new medication the patient has not picked up yet.  No other diabetic medications and she has never used insulin.  S/p 5 units NovoLog in the ED due to hyperkalemia but patient also had CBG of 234. - Sensitive sliding scale - semglee 5 mg tonight  - CBGs before meals and at bedtime

## 2023-08-13 NOTE — ED Notes (Signed)
Phlebotomy notified of need for them to get labs

## 2023-08-13 NOTE — ED Notes (Signed)
ED TO INPATIENT HANDOFF REPORT  ED Nurse Name and Phone #: Victorino Dike [phone]  S Name/Age/Gender Rachel [name] 82 y.o. female Room/Bed: 039C/039C  Code Status   Code Status: Full Code  Home/SNF/Other Home Patient oriented to: self, place, time, and situation Is this baseline? Yes   Triage Complete: Triage complete  Chief Complaint Atrial fibrillation with RVR (HCC) [I48.91]  Triage Note Pt BIB [facility] with near syncopal episode after showering this morning. Pt reports loss of consciousness after sitting on bed for few minutes. HR per EMS 140, Hx of a-fib. HR 147 in triage. Pt reports light-headed and feels "something stuck in my chest". Endorses nausea but no emesis.    Allergies Allergies  Allergen Reactions   Aspirin Nausea And Vomiting and Other (See Comments)    Uncoated version = "Jittery and GI upset" (has tolerated the chewable version)   Atorvastatin Other (See Comments)    Caused aching in the body   Codeine Sulfate Nausea And Vomiting and Other (See Comments)    Heart races also   Glimepiride Diarrhea   Metformin Diarrhea   Prandin [Repaglinide] Other (See Comments)    "it upset my stomach"   Propoxyphene N-Acetaminophen Nausea And Vomiting    Level of Care/Admitting Diagnosis ED Disposition     ED Disposition  Admit   Condition  --   Comment  Hospital Area: [facility] [[id number]]  Level of Care: Telemetry Medical [104]  May admit patient to [facility] or [facility] if equivalent level of care is available:: No  Covid Evaluation: Asymptomatic - no recent exposure (last 10 days) testing not required  Diagnosis: Atrial fibrillation with RVR [facility]) [[id number]]  Admitting Physician: Para March [[id number]]  Attending Physician: Janit Pagan T [[id number]]  Certification:: I certify this patient will need inpatient services for at least 2 midnights  Expected Medical Readiness: 08/16/2023          B Medical/Surgery  History Past Medical History:  Diagnosis Date   (HFpEF) heart failure with preserved ejection fraction (HCC) 01/17/2023   Allergic rhinitis    CAD in native artery 07/17/2023   Diabetes mellitus, type 2 (HCC)    Hyperlipemia    Hypertension    Left shoulder pain 11/01/2021   Lipoma NEC    anterior upper chest   Osteoarthritis of knee    PAF (paroxysmal atrial fibrillation) (HCC) 02/24/2023   Shortness of breath 11/01/2021   Past Surgical History:  Procedure Laterality Date   ACNE CYST REMOVAL  1970   back   APPENDECTOMY  1963   LEFT HEART CATH AND CORONARY ANGIOGRAPHY N/A 02/25/2023   Procedure: LEFT HEART CATH AND CORONARY ANGIOGRAPHY;  Surgeon: Kathleene Hazel, MD;  Location: [facility] INVASIVE CV LAB;  Service: Cardiovascular;  Laterality: N/A;   OOPHORECTOMY  1985   VAGINAL HYSTERECTOMY  1985   complete     A IV Location/Drains/Wounds Patient Lines/Drains/Airways Status     Active Line/Drains/Airways     Name Placement date Placement time Site Days   Peripheral IV 08/13/23 22 G Left;Posterior Hand 08/13/23  1212  Hand  less than 1            Intake/Output Last 24 hours No intake or output data in the 24 hours ending 08/13/23 1846  Labs/Imaging Results for orders placed or performed during the hospital encounter of 08/13/23 (from the past 48 hour(s))  Basic metabolic panel     Status: Abnormal   Collection Time: 08/13/23 11:45 AM  Result Value Ref Range   Sodium 135 135 - 145 mmol/L   Potassium 5.5 (H) 3.5 - 5.1 mmol/L   Chloride 106 98 - 111 mmol/L   CO2 19 (L) 22 - 32 mmol/L   Glucose, Bld 252 (H) 70 - 99 mg/dL    Comment: Glucose reference range applies only to samples taken after fasting for at least 8 hours.   BUN 29 (H) 8 - 23 mg/dL   Creatinine, Ser 4.09 (H) 0.44 - 1.00 mg/dL   Calcium 9.5 8.9 - 81.1 mg/dL   GFR, Estimated 39 (L) >60 mL/min    Comment: (NOTE) Calculated using the CKD-EPI Creatinine Equation (2021)    Anion gap 10 5 - 15     Comment: Performed at [facility], [address] [place], [place] [address]  CBC     Status: Abnormal   Collection Time: 08/13/23 11:45 AM  Result Value Ref Range   WBC 9.1 4.0 - 10.5 K/uL   RBC 4.31 3.87 - 5.11 MIL/uL   Hemoglobin 11.9 (L) 12.0 - 15.0 g/dL   HCT 29.5 62.1 - 30.8 %   MCV 86.8 80.0 - 100.0 fL   MCH 27.6 26.0 - 34.0 pg   MCHC 31.8 30.0 - 36.0 g/dL   RDW 65.7 84.6 - 96.2 %   Platelets 303 150 - 400 K/uL   nRBC 0.0 0.0 - 0.2 %    Comment: Performed at [facility], [address], [place], [place] [address]  Magnesium     Status: None   Collection Time: 08/13/23 11:45 AM  Result Value Ref Range   Magnesium 1.8 1.7 - 2.4 mg/dL    Comment: Performed at [facility], [address], [place], [place] [address]  Troponin I (High Sensitivity)     Status: None   Collection Time: 08/13/23 11:56 AM  Result Value Ref Range   Troponin I (High Sensitivity) 10 <18 ng/L    Comment: (NOTE) Elevated high sensitivity troponin I (hsTnI) values and significant  changes across serial measurements may suggest ACS but many other  chronic and acute conditions are known to elevate hsTnI results.  Refer to the "Links" section for chest pain algorithms and additional  guidance. Performed at [facility], [address], [place], [place] [address]   Troponin I (High Sensitivity)     Status: None   Collection Time: 08/13/23  2:30 PM  Result Value Ref Range   Troponin I (High Sensitivity) 10 <18 ng/L    Comment: (NOTE) Elevated high sensitivity troponin I (hsTnI) values and significant  changes across serial measurements may suggest ACS but many other  chronic and acute conditions are known to elevate hsTnI results.  Refer to the "Links" section for chest pain algorithms and additional  guidance. Performed at [facility], [address], [place], [place] [address]   CBG monitoring, ED     Status: Abnormal   Collection Time: 08/13/23  2:52 PM  Result  Value Ref Range   Glucose-Capillary 234 (H) 70 - 99 mg/dL    Comment: Glucose reference range applies only to samples taken after fasting for at least 8 hours.  CBG monitoring, ED     Status: Abnormal   Collection Time: 08/13/23  6:00 PM  Result Value Ref Range   Glucose-Capillary 194 (H) 70 - 99 mg/dL    Comment: Glucose reference range applies only to samples taken after fasting for at least 8 hours.  DG Chest 2 View  Result Date: 08/13/2023 CLINICAL DATA:  Syncope EXAM: CHEST - 2 VIEW COMPARISON:  Chest x-ray 02/24/2023 FINDINGS: The heart size and mediastinal contours are within normal limits. Both lungs are clear. The visualized skeletal structures are unremarkable. IMPRESSION: No active cardiopulmonary disease. Electronically Signed   By: Darliss Cheney M.D.   On: 08/13/2023 17:02    Pending Labs Unresulted Labs (From admission, onward)     Start     Ordered   08/14/23 0500  Basic metabolic panel  Tomorrow morning,   R        08/13/23 1619   08/14/23 0500  CBC  Tomorrow morning,   R        08/13/23 1619   08/13/23 1616  Brain natriuretic peptide  Add-on,   AD        08/13/23 1619   08/13/23 1615  TSH  Add-on,   AD        08/13/23 1619            Vitals/Pain Today's Vitals   08/13/23 1553 08/13/23 1600 08/13/23 1630 08/13/23 1700  BP:  (!) 131/58 (!) 121/58 (!) 122/94  Pulse:  88 78 76  Resp:  16 16 17   Temp: 97.9 F (36.6 C)     TempSrc: Oral     SpO2:  99% 98% 99%  Weight:      Height:      PainSc:        Isolation Precautions No active isolations  Medications Medications  diltiazem (CARDIZEM) 1 mg/mL load via infusion 10 mg (10 mg Intravenous Bolus from Bag 08/13/23 1213)    And  diltiazem (CARDIZEM) 125 mg in dextrose 5% 125 mL (1 mg/mL) infusion (5 mg/hr Intravenous New Bag/Given 08/13/23 1213)  rosuvastatin (CRESTOR) tablet 5 mg (has no administration in time range)  loratadine (CLARITIN) tablet 10 mg (has no administration in time range)   fluticasone (FLONASE) 50 MCG/ACT nasal spray 2 spray (has no administration in time range)  metoprolol succinate (TOPROL-XL) 24 hr tablet 25 mg (has no administration in time range)  apixaban (ELIQUIS) tablet 5 mg (has no administration in time range)  cetaphil lotion ( Topical Not Given 08/13/23 1653)  insulin aspart (novoLOG) injection 0-9 Units (2 Units Subcutaneous Given 08/13/23 1839)  insulin glargine-yfgn (SEMGLEE) injection 5 Units (has no administration in time range)  furosemide (LASIX) injection 40 mg (40 mg Intravenous Given 08/13/23 1502)  sodium zirconium cyclosilicate (LOKELMA) packet 10 g (10 g Oral Given 08/13/23 1458)  insulin aspart (novoLOG) injection 5 Units (5 Units Intravenous Given 08/13/23 1500)    Mobility walks with device     Focused Assessments     R Recommendations: See Admitting Provider Note  Report given to:   Additional Notes:

## 2023-08-13 NOTE — Telephone Encounter (Signed)
Called pt and could not reach; could not leave vm to let pt know we have not tried to contact her from our practice.

## 2023-08-13 NOTE — H&P (Cosign Needed Addendum)
Hospital Admission History and Physical Service Pager: [phone]  Patient name: Rachel [name] Medical record number: [id number] Date of Birth: August 16, 1941 Age: 82 y.o. Gender: female  Primary Care Provider: Rema Fendt, NP Consultants: none Code Status: Full Preferred Emergency Contact:  Contact Information     Name Relation Home Work [place] Daughter [phone]  [phone]   Johnson,Brittnie Granddaughter   [phone]   Dutch Gray Granddaughter   [phone]      Other Contacts   None on File    Chief Complaint: near syncope, A-fib with RVR  Assessment and Plan: Corinthia Seaberg is a 82 y.o. female presenting with presyncope in the setting of A-fib with RVR. Differential for presentation of this includes arrhythmia vs structural heart disease vs orthostatic hypotension vs vasovagal vs hypoglycemia vs anemia vs medication reaction. Chest pain with vomiting likely d/t a fib w/ RVR. Low concern for PE given she has been taking eliquis and CP has resolved. Low concern ACS as trops low and flat. Suspect this is due to A-fib with RVR given heart rate in the 140s on arrival to the ED and history of very similar presentation requiring hospital admission.  Lower suspicion for structural heart disease given most recent echo was in June 2024 with no structural abnormalities.  Low suspicion for orthostatic hypotension given that patient was sitting when she had the episode.  Do not suspect this was related to hypoglycemia given her untreated diabetes with most recent A1c of 10.2 last month.  Hemoglobin at baseline today 11.9.  Only new medication changes increase of her spironolactone dosage. Assessment & Plan Atrial fibrillation with RVR (HCC) Patient with history of paroxysmal A-fib on Eliquis 5 mg twice daily, Metoprolol 25 mg at bedtime, Diltiazem 120 mg daily.  Of note, patient was admitted to the hospital in June 2024 with very similar  symptoms and was in A-fib with RVR at that time.  Had cardiac cath with mild CAD and echo with EF 65 to 70% and otherwise unremarkable.    Follows with Dr. Duke Salvia for cardiology. Currently on diltiazem drip, on exam heart rate fluctuated between 1 teens-140s - Admitted to FMTS, Dr. Miquel Dunn attending, MedSurg - Continue home Eliquis 5 mg twice daily - IV diltiazem drip - Continue home metoprolol succinate 25 mg at bedtime - Cardiac telemetry - Resume home diltiazem once rate is consistently controlled on drip - Vital signs per floor protocol - echo ordered - AM BMP, CBC, TSH, BNP Near syncope Slumped over while sitting on edge of bed, denies LOC.  Denies hitting her head.  Suspect this is related to episode of A-fib with RVR.  Low suspicion for neurological cause.  No focal weakness or neurological symptoms.Troponins negative.  Chest x-ray negative.  CBC unremarkable.  Creatinine slightly elevated at 1.34 however this appears to be around her baseline. - Continue to monitor symptoms Hyperkalemia Potassium 5.5 in ED.  Patient given Lokelma 10 g, NovoLog 5 units, Lasix 40 mg.  I suspect hyperkalemia is due to recent increase of spironolactone dosage from 12.5 mg to 25 mg.  No EKG changes related to hyperkalemia such as peaked T waves. 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  No other diabetic medications and  she has never used insulin.  S/p 5 units NovoLog in the ED due to hyperkalemia but patient also had CBG of 234. - Sensitive sliding scale - semglee 5 mg tonight  - CBGs before meals and at bedtime  Chronic and Stable Problems:  Hypertension - holding home valsartan-HCTZ and spironolactone due to hyperkalemia HLD/CAD -  continue rosuvastatin 5 mg daily  FEN/GI: heart healthy VTE Prophylaxis: lovenox  Disposition: med-tele, inpatient status  History of Present Illness:  Rachel [name] is a 82 y.o. female presenting with presyncope. Pt admits to feeling very tired for past 2 days with intermittent SOB on exertion.  States she did not sleep last night because she felt anxious.  After her shower this morning, she sat down the applesauce but then slumped over on her bed and dropped the applesauce.  States she felt lightheaded but never fully lost consciousness.  Denies hitting her head.  Around 30 minutes later she developed what she describes as heartburn in the middle of her chest and vomited x 1 with dry heaving which resolved the pain. Notes occasional cough when lying down which she attributes to her allergies.  Also notes mild epistaxis every 4 to 6 weeks that has been going on for a while.  Patient denies fever, sore throat, abdominal pain, diarrhea, leg swelling.  Denies chest pain other than the heartburn she experienced.  No recent medications other than increase of her spironolactone to 25mg  from 12.5mg  recently.  In the ED, patient in A-fib with RVR with rates in the 140s.  Patient started on Cardizem drip with return to normal heart rate. Patient hyperkalemic at 5.5, given Lokelma 10g, novolog 5U and Lasix 40 mg  Review Of Systems: Per HPI with the following additions: none  Pertinent Past Medical History: HFpEF - echo 02/2023 CAD Type 2 diabetes - Januvia 25 mg daily HLD - Rosuvastatin 5 mg daily Hypertension - Valsartan-hydrochlorothiazide 320-25 mg daily, Spironolactone 25 mg daily (increased from 12.5 a few weeks ago) Paroxysmal A-fib - Eliquis 5 mg twice daily, Metoprolol 25 mg at bedtime, Diltiazem 120 mg daily  Remainder reviewed in history tab.   Pertinent Past Surgical History: L heart cath 02/2023 - mild non-obstructive CAD Appendectomy Complete hysterectomy and  oophorectomy   Remainder reviewed in history tab.  Pertinent Social History: Tobacco use: Never Alcohol use: Only on holidays - small drink Other Substance use: No Lives alone  Pertinent Family History: Mother-diabetes, MI Father-brain cancer  Remainder reviewed in history tab.   Important Outpatient Medications: Albuterol as needed Pulmicort Ipratropium inhaler Protonix 40 mg daily  See above  Remainder reviewed in medication history.   Objective: BP (!) 122/94   Pulse 76   Temp 97.9 F (36.6 C) (Oral)   Resp 17   Ht 5\' 3"  (1.6 m)   Wt 92 kg   SpO2 99%   BMI 35.93 kg/m  Exam: General: Well-appearing, no acute distress Eyes: EOMI ENTM: Moist mucous membranes Cardiovascular: Irregularly irregular rhythm, tachycardic Respiratory: No increased work of breathing on room air, CTAB Gastrointestinal: Normal bowel sounds, soft, nondistended MSK: No leg swelling bilateral lower extremities Derm: Dry skin noted to midline of lower back Neuro: Alert and oriented x 4 Psych: Normal affect  Labs:  CBC BMET  Recent Labs  Lab 08/13/23 1145  WBC 9.1  HGB 11.9*  HCT 37.4  PLT 303   Recent Labs  Lab 08/13/23 1145  NA 135  K 5.5*  CL 106  CO2 19*  BUN  29*  CREATININE 1.37*  GLUCOSE 252*  CALCIUM 9.5     Mag 1.8 Trop 10 CBG 234  EKG: Atrial flutter at 148BPM. Qtc .    Imaging Studies Performed:  Chest XR 08/13/23 IMPRESSION: No active cardiopulmonary disease.  I independently reviewed and agree with radiologist's impression of imaging study. Hyperinflated lung fields, no consolidations, no blunting of costophrenic angles   Everhart, Kirstie, DO 08/13/2023, 6:11 PM PGY-1, [facility]  FPTS Intern pager: [phone], text pages welcome Secure chat group [facility] Teaching Service   I was personally present and re-performed the exam and medical decision making and verified the service and findings are accurately  documented in the resident's note.  Erick Alley, DO 08/13/2023 6:45 PM

## 2023-08-14 ENCOUNTER — Telehealth (HOSPITAL_COMMUNITY): Payer: Self-pay | Admitting: Pharmacy Technician

## 2023-08-14 ENCOUNTER — Other Ambulatory Visit (HOSPITAL_COMMUNITY): Payer: Self-pay

## 2023-08-14 ENCOUNTER — Inpatient Hospital Stay (HOSPITAL_COMMUNITY): Payer: PPO

## 2023-08-14 DIAGNOSIS — E875 Hyperkalemia: Secondary | ICD-10-CM | POA: Diagnosis not present

## 2023-08-14 DIAGNOSIS — I4891 Unspecified atrial fibrillation: Secondary | ICD-10-CM

## 2023-08-14 DIAGNOSIS — R55 Syncope and collapse: Secondary | ICD-10-CM | POA: Diagnosis not present

## 2023-08-14 DIAGNOSIS — Z7984 Long term (current) use of oral hypoglycemic drugs: Secondary | ICD-10-CM | POA: Diagnosis not present

## 2023-08-14 HISTORY — DX: Hyperkalemia: E87.5

## 2023-08-14 LAB — CBC
HCT: 35.1 % — ABNORMAL LOW (ref 36.0–46.0)
Hemoglobin: 11.5 g/dL — ABNORMAL LOW (ref 12.0–15.0)
MCH: 27.9 pg (ref 26.0–34.0)
MCHC: 32.8 g/dL (ref 30.0–36.0)
MCV: 85.2 fL (ref 80.0–100.0)
Platelets: 301 10*3/uL (ref 150–400)
RBC: 4.12 MIL/uL (ref 3.87–5.11)
RDW: 14.1 % (ref 11.5–15.5)
WBC: 9.3 10*3/uL (ref 4.0–10.5)
nRBC: 0 % (ref 0.0–0.2)

## 2023-08-14 LAB — BASIC METABOLIC PANEL
Anion gap: 9 (ref 5–15)
BUN: 28 mg/dL — ABNORMAL HIGH (ref 8–23)
CO2: 22 mmol/L (ref 22–32)
Calcium: 9.3 mg/dL (ref 8.9–10.3)
Chloride: 106 mmol/L (ref 98–111)
Creatinine, Ser: 1.3 mg/dL — ABNORMAL HIGH (ref 0.44–1.00)
GFR, Estimated: 42 mL/min — ABNORMAL LOW (ref >60)
Glucose, Bld: 208 mg/dL — ABNORMAL HIGH (ref 70–99)
Potassium: 4.4 mmol/L (ref 3.5–5.1)
Sodium: 137 mmol/L (ref 135–145)

## 2023-08-14 LAB — ECHOCARDIOGRAM COMPLETE
AR max vel: 2.1 cm2
AV Area VTI: 2.72 cm2
AV Area mean vel: 1.88 cm2
AV Mean grad: 8 mm[Hg]
AV Peak grad: 11.7 mm[Hg]
Ao pk vel: 1.71 m/s
Area-P 1/2: 4.15 cm2
Height: 63 in
S' Lateral: 2 cm
Weight: 3245.17 [oz_av]

## 2023-08-14 LAB — GLUCOSE, CAPILLARY
Glucose-Capillary: 183 mg/dL — ABNORMAL HIGH (ref 70–99)
Glucose-Capillary: 242 mg/dL — ABNORMAL HIGH (ref 70–99)
Glucose-Capillary: 276 mg/dL — ABNORMAL HIGH (ref 70–99)
Glucose-Capillary: 257 mg/dL — ABNORMAL HIGH (ref 70–99)
Glucose-Capillary: 235 mg/dL — ABNORMAL HIGH (ref 70–99)

## 2023-08-14 MED ORDER — DILTIAZEM HCL 30 MG PO TABS
60.0000 mg | ORAL_TABLET | Freq: Three times a day (TID) | ORAL | Status: DC
  Administered 2023-08-14: 60 mg via ORAL
  Filled 2023-08-14: qty 2

## 2023-08-14 MED ORDER — DILTIAZEM HCL 30 MG PO TABS
60.0000 mg | ORAL_TABLET | Freq: Four times a day (QID) | ORAL | Status: AC
  Administered 2023-08-14 – 2023-08-15 (×5): 60 mg via ORAL
  Filled 2023-08-14 (×5): qty 2

## 2023-08-14 NOTE — Assessment & Plan Note (Signed)
Slumped over while sitting on edge of bed, denies LOC.  Denies hitting her head.  Suspect this is related to episode of A-fib with RVR.  Low suspicion for neurological cause.  No focal weakness or neurological symptoms.Troponins negative.  Chest x-ray negative.  CBC unremarkable.  Creatinine slightly elevated at 1.34 however this appears to be around her baseline. - Continue to monitor symptoms

## 2023-08-14 NOTE — Plan of Care (Signed)
  Problem: Education: Goal: Ability to describe self-care measures that may prevent or decrease complications (Diabetes Survival Skills Education) will improve Outcome: Progressing Goal: Individualized Educational Video(s) Outcome: Progressing   Problem: Coping: Goal: Ability to adjust to condition or change in health will improve Outcome: Progressing   Problem: Fluid Volume: Goal: Ability to maintain a balanced intake and output will improve Outcome: Progressing   Problem: Health Behavior/Discharge Planning: Goal: Ability to identify and utilize available resources and services will improve Outcome: Progressing Goal: Ability to manage health-related needs will improve Outcome: Progressing   Problem: Metabolic: Goal: Ability to maintain appropriate glucose levels will improve Outcome: Progressing   Problem: Nutritional: Goal: Maintenance of adequate nutrition will improve Outcome: Progressing Goal: Progress toward achieving an optimal weight will improve Outcome: Progressing   Problem: Skin Integrity: Goal: Risk for impaired skin integrity will decrease Outcome: Progressing   Problem: Tissue Perfusion: Goal: Adequacy of tissue perfusion will improve Outcome: Progressing   Problem: Education: Goal: Knowledge of General Education information will improve Description: Including pain rating scale, medication(s)/side effects and non-pharmacologic comfort measures Outcome: Progressing   Problem: Health Behavior/Discharge Planning: Goal: Ability to manage health-related needs will improve Outcome: Progressing   Problem: Clinical Measurements: Goal: Ability to maintain clinical measurements within normal limits will improve Outcome: Progressing Goal: Will remain free from infection Outcome: Progressing Goal: Diagnostic test results will improve Outcome: Progressing Goal: Respiratory complications will improve Outcome: Progressing Goal: Cardiovascular complication will  be avoided Outcome: Progressing   Problem: Activity: Goal: Risk for activity intolerance will decrease Outcome: Progressing   Problem: Nutrition: Goal: Adequate nutrition will be maintained Outcome: Progressing   Problem: Coping: Goal: Level of anxiety will decrease Outcome: Progressing   Problem: Elimination: Goal: Will not experience complications related to bowel motility Outcome: Progressing Goal: Will not experience complications related to urinary retention Outcome: Progressing   Problem: Pain Management: Goal: General experience of comfort will improve Outcome: Progressing   Problem: Safety: Goal: Ability to remain free from injury will improve Outcome: Progressing   Problem: Skin Integrity: Goal: Risk for impaired skin integrity will decrease Outcome: Progressing   Problem: Education: Goal: Knowledge of disease or condition will improve Outcome: Progressing Goal: Understanding of medication regimen will improve Outcome: Progressing Goal: Individualized Educational Video(s) Outcome: Progressing   Problem: Activity: Goal: Ability to tolerate increased activity will improve Outcome: Progressing   Problem: Cardiac: Goal: Ability to achieve and maintain adequate cardiopulmonary perfusion will improve Outcome: Progressing   Problem: Health Behavior/Discharge Planning: Goal: Ability to safely manage health-related needs after discharge will improve Outcome: Progressing

## 2023-08-14 NOTE — Assessment & Plan Note (Addendum)
Potassium 5.5 initially, given Lokelma 10 g, NovoLog 5 units, Lasix 40 mg in the ED. Trended down 5.1 > 4.4.  I suspect hyperkalemia was due to recent increase of spironolactone dosage from 12.5 mg to 25 mg.  - Holding spironolactone and valsartan-hydrochlorothiazide for now - AM BMP

## 2023-08-14 NOTE — Progress Notes (Signed)
Mobility Specialist Progress Note:   08/14/23 1522  Mobility  Activity Ambulated with assistance to bathroom  Level of Assistance Contact guard assist, steadying assist  Assistive Device Front wheel walker  Distance Ambulated (ft) 30 ft  Activity Response Tolerated fair  Mobility Referral Yes  $Mobility charge 1 Mobility  Mobility Specialist Start Time (ACUTE ONLY) 1411  Mobility Specialist Stop Time (ACUTE ONLY) 1440  Mobility Specialist Time Calculation (min) (ACUTE ONLY) 29 min   Pre Mobility: 78 HR During Mobility: 148 HR , Post Mobility: 81 HR ,114/58 BP  Pt received in bed, agreeable to mobility. SB to stand. CG during ambulation. Upon standing pt c/o feeling lightheaded, requesting to sit back down. VSS. After recovery pt requesting to use BR before hallway ambulation. Void successful. HR peaked at 148 bpm when walking out of BR. Pt requesting to sit down with c/o lightheadedness and dizziness. Rated dizziness 8/10. Further ambulation deferred d/t symptoms. Pt left in chair asymptomatic with call bell in reach and all needs met. RN notified.   Leory Plowman  Mobility Specialist Please contact via [employer] office at [phone]

## 2023-08-14 NOTE — Assessment & Plan Note (Signed)
Potassium 5.5 in ED.  Patient given Lokelma 10 g, NovoLog 5 units, Lasix 40 mg.  I suspect hyperkalemia is due to recent increase of spironolactone dosage from 12.5 mg to 25 mg.  No EKG changes related to hyperkalemia such as peaked T waves. - Holding spironolactone and valsartan-hydrochlorothiazide for now -8 PM BMP - AM BMP

## 2023-08-14 NOTE — Inpatient Diabetes Management (Signed)
Inpatient Diabetes Program Recommendations  AACE/ADA: New Consensus Statement on Inpatient Glycemic Control (2015)  Target Ranges:  Prepandial:   less than 140 mg/dL      Peak postprandial:   less than 180 mg/dL (1-2 hours)      Critically ill patients:  140 - 180 mg/dL   Lab Results  Component Value Date   GLUCAP 242 (H) 08/14/2023   HGBA1C 10.2 (H) 07/25/2023    Review of Glycemic Control  Latest Reference Range & Units 08/13/23 18:00 08/13/23 21:30 08/14/23 06:49 08/14/23 12:11  Glucose-Capillary 70 - 99 mg/dL 643 (H) 329 (H) 518 (H) 242 (H)  (H): Data is abnormally high  Diabetes history: DM2 Outpatient Diabetes medications: Januvia 25 mg QD Current orders for Inpatient glycemic control: Semglee 5 units every day, Novolog 0-9 units TID  Inpatient Diabetes Program Recommendations:    Might consider Amaryl 2 mg QAM at discharge.   Please order a glucometer at [facility]-Order # [id number]  Spoke with patient at bedside.  Reviewed patient's current A1c of 10.2%. Explained what a A1c is and what it measures. Also reviewed goal A1c with patient, importance of good glucose control @ home, and blood sugar goals.   She states her PCP just started her on Januvia 25 mg every day but she has not picked it up yet.  She has a glucometer at home but her lancing device is broken.  Will ask MD to order a glucometer at [facility].  She is interested in the [employer] 3.  She has an Android cell phone which should be compatible.  Asked pharmacy for a benefit check and the Josephine Igo is covered 100%.    She says her PCP has told her in the past that she needs insulin; however, the patient does not want to start insulin.  Might consider Amaryl 2 mg at discharge.  Educated her on this medication.    Educated on The Plate Method, CHO's, portion control, CBGs at home fasting and mid afternoon, F/U with PCP every 3 months, bring meter to PCP office, long and short term complications of uncontrolled BG, and importance of  exercise.  Ordered the LWWD booklet.  She is motivated to get her diabetes under control.    Will continue to follow while inpatient.  Thank you, Dulce Sellar, MSN, CDCES Diabetes Coordinator Inpatient Diabetes Program [phone] (team pager from 8a-5p)

## 2023-08-14 NOTE — Progress Notes (Signed)
Daily Progress Note Intern Pager: [phone]  Patient name: Rachel [name] Medical record number: [id number] Date of birth: 1941-06-16 Age: 82 y.o. Gender: female  Primary Care Provider: Rema Fendt, NP Consultants: none Code Status: Full  Pt Overview and Major Events to Date:  11/19 - admitted  Assessment and Plan:  81yo with PMHx paroxysmal afib, HTN, HLD/CAD who presented with near syncope in the setting of afib with RVR.   Transitioning IV diltiazem drip to PO diltiazem 60mg  q8h today. Pending echo. Assessment & Plan Atrial fibrillation with RVR (HCC) Patient with history of paroxysmal A-fib. Of note, patient was admitted to the hospital in June 2024 with very similar symptoms and was in A-fib with RVR at that time.  Had cardiac cath with mild CAD and echo with EF 65 to 70% and otherwise unremarkable.    Follows with Dr. Duke Salvia for cardiology. HR has stabilized into 70s on diltiazem drip, still in a-flutter - Continue home Eliquis 5 mg twice daily - Transition IV diltiazem to PO diltiazem today - hold home metoprolol for now - echo - Cardiac telemetry - AM BMP, CBC, TSH, BNP Near syncope Slumped over while sitting on edge of bed, denies LOC.  Denies hitting her head.  Suspect this is related to episode of A-fib with RVR.  Low suspicion for neurological cause.  No focal weakness or neurological symptoms.Troponins negative.  Chest x-ray negative.  CBC unremarkable.  Creatinine slightly elevated at 1.30 however this appears to be around her baseline. - Continue to monitor symptoms Hyperkalemia Potassium 5.5 initially, given Lokelma 10 g, NovoLog 5 units, Lasix 40 mg in the ED. Trended down 5.1 > 4.4.  I suspect hyperkalemia was due to recent increase of spironolactone dosage from 12.5 mg to 25 mg.  - Holding spironolactone and valsartan-hydrochlorothiazide for now - AM BMP DM2 (diabetes mellitus, type 2) (HCC) Januvia 25 mg daily on medication list, however  it appears this was a new medication the patient has not picked up yet.  No other diabetic medications and she has never used insulin.  S/p 5 units NovoLog in the ED due to hyperkalemia but patient also had CBG of 234. - Sensitive sliding scale - semglee 5 mg  - CBGs before meals and at bedtime  Chronic and Stable Problems:  Hypertension - holding home valsartan-HCTZ and spironolactone due to hyperkalemia HLD/CAD - continue rosuvastatin 5 mg daily   FEN/GI: heart healthy PPx: home eliquis 5mg  BID Dispo:Home pending clinical improvement   Subjective:  No acute events overnight.  No concerns or complaints this morning.  States that she feels tired, she did not sleep the night before being hospitalized.  Objective: Temp:  [97.5 F (36.4 C)-98.1 F (36.7 C)] 97.5 F (36.4 C) (11/20 0809) Pulse Rate:  [70-142] 77 (11/20 0809) Resp:  [12-20] 18 (11/20 0809) BP: (104-161)/(52-94) 134/65 (11/20 0809) SpO2:  [98 %-100 %] 99 % (11/20 0809) Weight:  [92 kg] 92 kg (11/19 2010) Physical Exam: General: Well-appearing, no acute distress Cardiovascular: regular rate and regular rhythm Respiratory: CTAB, no increased work of breathing on room air  Laboratory: Most recent CBC Lab Results  Component Value Date   WBC 9.3 08/14/2023   HGB 11.5 (L) 08/14/2023   HCT 35.1 (L) 08/14/2023   MCV 85.2 08/14/2023   PLT 301 08/14/2023   Most recent BMP    Latest Ref Rng & Units 08/14/2023    4:01 AM  BMP  Glucose 70 - 99 mg/dL 147  BUN 8 - 23 mg/dL 28   Creatinine 0.73 - 1.00 mg/dL 7.10   Sodium 626 - 948 mmol/L 137   Potassium 3.5 - 5.1 mmol/L 4.4   Chloride 98 - 111 mmol/L 106   CO2 22 - 32 mmol/L 22   Calcium 8.9 - 10.3 mg/dL 9.3    Imaging/Diagnostic Tests: CXR 08/13/23 IMPRESSION: No active cardiopulmonary disease.  Kyley Laurel, DO 08/14/2023, 11:46 AM  PGY-1, [facility] FPTS Intern pager: [phone], text pages welcome Secure chat group [facility] Teaching Service

## 2023-08-14 NOTE — Telephone Encounter (Signed)
Patient Product/process development scientist completed.    The patient is insured through [employer]/ [employer]. Patient has Medicare and is not eligible for a copay card, but may be able to apply for patient assistance, if available.    Ran test claim for [employer] 3 Sensor and the current 30 day co-pay is $0.00.  Ran test claim for Dexcom G7 Sensor and the current 30 day co-pay is $0.00.  This test claim was processed through [facility] copay amounts may vary at other pharmacies due to pharmacy/plan contracts, or as the patient moves through the different stages of their insurance plan.     Roland Earl, CPHT Pharmacy Technician III Certified Patient Advocate [facility] Pharmacy Patient Advocate Team Direct Number: [phone]  Fax: [phone]

## 2023-08-14 NOTE — Assessment & Plan Note (Addendum)
Januvia 25 mg daily on medication list, however it appears this was a new medication the patient has not picked up yet.  No other diabetic medications and she has never used insulin.  S/p 5 units NovoLog in the ED due to hyperkalemia but patient also had CBG of 234. - Sensitive sliding scale - semglee 5 mg  - CBGs before meals and at bedtime

## 2023-08-14 NOTE — Assessment & Plan Note (Addendum)
Patient with history of paroxysmal A-fib. Of note, patient was admitted to the hospital in June 2024 with very similar symptoms and was in A-fib with RVR at that time.  Had cardiac cath with mild CAD and echo with EF 65 to 70% and otherwise unremarkable.    Follows with Dr. Duke Salvia for cardiology. HR has stabilized into 70s on diltiazem drip, still in a-flutter - Continue home Eliquis 5 mg twice daily - Transition IV diltiazem to PO diltiazem today - hold home metoprolol for now - echo - Cardiac telemetry - AM BMP, CBC, TSH, BNP

## 2023-08-14 NOTE — Assessment & Plan Note (Addendum)
Slumped over while sitting on edge of bed, denies LOC.  Denies hitting her head.  Suspect this is related to episode of A-fib with RVR.  Low suspicion for neurological cause.  No focal weakness or neurological symptoms.Troponins negative.  Chest x-ray negative.  CBC unremarkable.  Creatinine slightly elevated at 1.30 however this appears to be around her baseline. - Continue to monitor symptoms

## 2023-08-15 ENCOUNTER — Encounter (HOSPITAL_COMMUNITY): Payer: Self-pay | Admitting: Family Medicine

## 2023-08-15 DIAGNOSIS — I4891 Unspecified atrial fibrillation: Secondary | ICD-10-CM | POA: Diagnosis not present

## 2023-08-15 DIAGNOSIS — E875 Hyperkalemia: Secondary | ICD-10-CM | POA: Diagnosis not present

## 2023-08-15 DIAGNOSIS — R55 Syncope and collapse: Secondary | ICD-10-CM | POA: Diagnosis not present

## 2023-08-15 DIAGNOSIS — N179 Acute kidney failure, unspecified: Secondary | ICD-10-CM | POA: Insufficient documentation

## 2023-08-15 LAB — URINALYSIS, ROUTINE W REFLEX MICROSCOPIC
Bilirubin Urine: NEGATIVE
Glucose, UA: NEGATIVE mg/dL
Hgb urine dipstick: NEGATIVE
Ketones, ur: NEGATIVE mg/dL
Nitrite: NEGATIVE
Protein, ur: NEGATIVE mg/dL
Specific Gravity, Urine: 1.008 (ref 1.005–1.030)
pH: 5 (ref 5.0–8.0)

## 2023-08-15 LAB — BASIC METABOLIC PANEL
Anion gap: 7 (ref 5–15)
BUN: 43 mg/dL — ABNORMAL HIGH (ref 8–23)
CO2: 23 mmol/L (ref 22–32)
Calcium: 9 mg/dL (ref 8.9–10.3)
Chloride: 101 mmol/L (ref 98–111)
Creatinine, Ser: 2.03 mg/dL — ABNORMAL HIGH (ref 0.44–1.00)
GFR, Estimated: 25 mL/min — ABNORMAL LOW (ref >60)
Glucose, Bld: 217 mg/dL — ABNORMAL HIGH (ref 70–99)
Potassium: 4.5 mmol/L (ref 3.5–5.1)
Sodium: 131 mmol/L — ABNORMAL LOW (ref 135–145)

## 2023-08-15 LAB — GLUCOSE, CAPILLARY
Glucose-Capillary: 179 mg/dL — ABNORMAL HIGH (ref 70–99)
Glucose-Capillary: 202 mg/dL — ABNORMAL HIGH (ref 70–99)
Glucose-Capillary: 250 mg/dL — ABNORMAL HIGH (ref 70–99)
Glucose-Capillary: 273 mg/dL — ABNORMAL HIGH (ref 70–99)

## 2023-08-15 LAB — TROPONIN I (HIGH SENSITIVITY)
Troponin I (High Sensitivity): 8 ng/L (ref <18)
Troponin I (High Sensitivity): 8 ng/L (ref <18)

## 2023-08-15 MED ORDER — INSULIN GLARGINE-YFGN 100 UNIT/ML ~~LOC~~ SOLN
10.0000 [IU] | Freq: Every day | SUBCUTANEOUS | Status: DC
  Administered 2023-08-15: 10 [IU] via SUBCUTANEOUS
  Filled 2023-08-15 (×2): qty 0.1

## 2023-08-15 MED ORDER — FAMOTIDINE 20 MG PO TABS
20.0000 mg | ORAL_TABLET | Freq: Once | ORAL | Status: AC
  Administered 2023-08-15: 20 mg via ORAL
  Filled 2023-08-15: qty 1

## 2023-08-15 MED ORDER — DILTIAZEM HCL ER COATED BEADS 180 MG PO CP24
180.0000 mg | ORAL_CAPSULE | Freq: Every day | ORAL | Status: DC
  Administered 2023-08-16 – 2023-08-17 (×2): 180 mg via ORAL
  Filled 2023-08-15 (×2): qty 1

## 2023-08-15 NOTE — Assessment & Plan Note (Addendum)
Januvia 25 mg daily on medication list, however it appears this was a new medication the patient has not picked up yet.  No other diabetic medications and she has never used insulin.  S/p 5 units NovoLog in the ED due to hyperkalemia but patient also had CBG of 234. CBGs have been in 200s throughout admission.  - Sensitive sliding scale - increase semglee to 10U - CBGs before meals and at bedtime

## 2023-08-15 NOTE — Plan of Care (Signed)
  Problem: Education: Goal: Ability to describe self-care measures that may prevent or decrease complications (Diabetes Survival Skills Education) will improve Outcome: Progressing Goal: Individualized Educational Video(s) Outcome: Progressing   Problem: Coping: Goal: Ability to adjust to condition or change in health will improve Outcome: Progressing   Problem: Fluid Volume: Goal: Ability to maintain a balanced intake and output will improve Outcome: Progressing   Problem: Health Behavior/Discharge Planning: Goal: Ability to identify and utilize available resources and services will improve Outcome: Progressing Goal: Ability to manage health-related needs will improve Outcome: Progressing   Problem: Metabolic: Goal: Ability to maintain appropriate glucose levels will improve Outcome: Progressing   Problem: Nutritional: Goal: Maintenance of adequate nutrition will improve Outcome: Progressing Goal: Progress toward achieving an optimal weight will improve Outcome: Progressing   Problem: Skin Integrity: Goal: Risk for impaired skin integrity will decrease Outcome: Progressing   Problem: Tissue Perfusion: Goal: Adequacy of tissue perfusion will improve Outcome: Progressing   Problem: Education: Goal: Knowledge of General Education information will improve Description: Including pain rating scale, medication(s)/side effects and non-pharmacologic comfort measures Outcome: Progressing   Problem: Health Behavior/Discharge Planning: Goal: Ability to manage health-related needs will improve Outcome: Progressing   Problem: Clinical Measurements: Goal: Ability to maintain clinical measurements within normal limits will improve Outcome: Progressing Goal: Will remain free from infection Outcome: Progressing Goal: Diagnostic test results will improve Outcome: Progressing Goal: Respiratory complications will improve Outcome: Progressing Goal: Cardiovascular complication will  be avoided Outcome: Progressing   Problem: Activity: Goal: Risk for activity intolerance will decrease Outcome: Progressing   Problem: Nutrition: Goal: Adequate nutrition will be maintained Outcome: Progressing   Problem: Coping: Goal: Level of anxiety will decrease Outcome: Progressing   Problem: Elimination: Goal: Will not experience complications related to bowel motility Outcome: Progressing Goal: Will not experience complications related to urinary retention Outcome: Progressing   Problem: Pain Management: Goal: General experience of comfort will improve Outcome: Progressing   Problem: Safety: Goal: Ability to remain free from injury will improve Outcome: Progressing   Problem: Skin Integrity: Goal: Risk for impaired skin integrity will decrease Outcome: Progressing   Problem: Education: Goal: Knowledge of disease or condition will improve Outcome: Progressing Goal: Understanding of medication regimen will improve Outcome: Progressing Goal: Individualized Educational Video(s) Outcome: Progressing   Problem: Activity: Goal: Ability to tolerate increased activity will improve Outcome: Progressing   Problem: Cardiac: Goal: Ability to achieve and maintain adequate cardiopulmonary perfusion will improve Outcome: Progressing   Problem: Health Behavior/Discharge Planning: Goal: Ability to safely manage health-related needs after discharge will improve Outcome: Progressing

## 2023-08-15 NOTE — Assessment & Plan Note (Addendum)
Slumped over while sitting on edge of bed, denies LOC.  Denies hitting her head.  Suspect this is related to episode of A-fib with RVR.  No other symptoms of this since admission.  - Continue to monitor symptoms

## 2023-08-15 NOTE — Plan of Care (Signed)
Notified by RN that pt was complaining of heartburn. Went to evaluate pt at bedside. Reports midsternal chest pressure and burning that began when she sat up to go to the bathroom. Associated light-headedness, nurse reports BP 90s/60s when she sat up. No other symptoms.  Obtained stat EKG, stat troponins. Called cardiology regarding EKG given concern for slight changes in leads V2-V6, they are not concerned for STEMI. Troponin negative. Low suspicion for ACS.  Will continue to monitor.

## 2023-08-15 NOTE — Progress Notes (Addendum)
Daily Progress Note Intern Pager: [phone]  Patient name: Rachel [name] Medical record number: [id number] Date of birth: Mar 10, 1941 Age: 82 y.o. Gender: female  Primary Care Provider: Rema Fendt, NP Consultants: none  Code Status: Full   Pt Overview and Major Events to Date:  11/19 - admitted   Assessment and Plan:  81yo with PMHx paroxysmal afib, HTN, HLD/CAD who presented with near syncope in the setting of afib with RVR.  Assessment & Plan Atrial fibrillation with RVR (HCC) Patient with history of paroxysmal A-fib. Of note, patient was admitted to the hospital in June 2024 with very similar symptoms and was in A-fib with RVR at that time.  Had cardiac cath with mild CAD and echo with EF 65 to 70% and otherwise unremarkable.    Follows with Dr. Duke Salvia for cardiology. HR has stabilized into 70s on diltiazem drip, still in a-flutter. Echo obtained here unremarkable.  - cardiology consulted, appreciate recs - TEE tomorrow - Continue home Eliquis 5 mg twice daily - continue PO diltiazem 60mg  q6h - hold home metoprolol for now - Cardiac telemetry - AM BMP Acute kidney injury (HCC) Creatinine 2.03 this morning, increased from 1.3 (baseline) yesterday. Unclear etiology. No concern for dehydration or hypovolemia currently. No nephrotoxic medications currently. Potentially post-renal - c/o intermittent lower back pain and urinary hesitancy - U/A > culture - Bladder scan - am BMP Near syncope Slumped over while sitting on edge of bed, denies LOC.  Denies hitting her head.  Suspect this is related to episode of A-fib with RVR.  No other symptoms of this since admission.  - Continue to monitor symptoms DM2 (diabetes mellitus, type 2) (HCC) Januvia 25 mg daily on medication list, however it appears this was a new medication the patient has not picked up yet.  No other diabetic medications and she has never used insulin.  S/p 5 units NovoLog in the ED due to  hyperkalemia but patient also had CBG of 234. CBGs have been in 200s throughout admission.  - Sensitive sliding scale - increase semglee to 10U - CBGs before meals and at bedtime Hyperkalemia (Resolved: 08/15/2023) Potassium 5.5 initially, given Lokelma 10 g, NovoLog 5 units, Lasix 40 mg in the ED. Trended down 5.1 > 4.4.  I suspect hyperkalemia was due to recent increase of spironolactone dosage from 12.5 mg to 25 mg.  - Holding spironolactone and valsartan-hydrochlorothiazide for now - AM BMP  Chronic and Stable Problems:  Hypertension - holding home valsartan-HCTZ and spironolactone due to hyperkalemia HLD/CAD - continue rosuvastatin 5 mg daily   FEN/GI: heart healthy PPx: eliquis 5mg  BID Dispo:Home pending clinical improvement   Subjective:  No acute events overnight. Asymptomatic, no more presyncope episodes since being here. Notes intermittent lower back pain that has been going on since prior to hospitalization, along with decreased urge to urinate. States sometimes it feels like her stream is slow. No dysuria/hematuria.   Objective: Temp:  [97.3 F (36.3 C)-97.9 F (36.6 C)] 97.5 F (36.4 C) (11/21 0748) Pulse Rate:  [65-103] 76 (11/21 0748) Resp:  [14-20] 19 (11/21 0748) BP: (119-149)/(49-78) 124/65 (11/21 0748) SpO2:  [98 %-100 %] 99 % (11/21 0748) Physical Exam: General: no acute distress, resting comfortably Cardiovascular: regular rate, regular rhythm on auscultation Respiratory: ctab, no increased WOB on RA  Laboratory: Most recent CBC Lab Results  Component Value Date   WBC 9.3 08/14/2023   HGB 11.5 (L) 08/14/2023   HCT 35.1 (L) 08/14/2023   MCV  85.2 08/14/2023   PLT 301 08/14/2023   Most recent BMP    Latest Ref Rng & Units 08/15/2023    3:43 AM  BMP  Glucose 70 - 99 mg/dL 244   BUN 8 - 23 mg/dL 43   Creatinine 0.10 - 1.00 mg/dL 2.72   Sodium 536 - 644 mmol/L 131   Potassium 3.5 - 5.1 mmol/L 4.5   Chloride 98 - 111 mmol/L 101   CO2 22 - 32  mmol/L 23   Calcium 8.9 - 10.3 mg/dL 9.0    Echo 03/47/42 1. Left ventricular ejection fraction, by estimation, is 60 to 65%. The  left ventricle has normal function. The left ventricle has no regional  wall motion abnormalities. Left ventricular diastolic function could not  be evaluated.   2. Right ventricular systolic function is normal. The right ventricular  size is normal.   3. The mitral valve is normal in structure. No evidence of mitral valve  regurgitation. No evidence of mitral stenosis.   4. The aortic valve is normal in structure. There is mild calcification  of the aortic valve. Aortic valve regurgitation is not visualized. No  aortic stenosis is present.   5. The inferior vena cava is normal in size with greater than 50%  respiratory variability, suggesting right atrial pressure of 3 mmHg.   Neiman Roots, DO 08/15/2023, 11:59 AM  PGY-1, [facility] FPTS Intern pager: [phone], text pages welcome Secure chat group [facility] Teaching Service

## 2023-08-15 NOTE — Assessment & Plan Note (Addendum)
Patient with history of paroxysmal A-fib. Of note, patient was admitted to the hospital in June 2024 with very similar symptoms and was in A-fib with RVR at that time.  Had cardiac cath with mild CAD and echo with EF 65 to 70% and otherwise unremarkable.    Follows with Dr. Duke Salvia for cardiology. HR has stabilized into 70s on diltiazem drip, still in a-flutter. Echo obtained here unremarkable.  - cardiology consulted, appreciate recs - TEE tomorrow - Continue home Eliquis 5 mg twice daily - continue PO diltiazem 60mg  q6h - hold home metoprolol for now - Cardiac telemetry - AM BMP

## 2023-08-15 NOTE — Inpatient Diabetes Management (Addendum)
Inpatient Diabetes Program Recommendations  AACE/ADA: New Consensus Statement on Inpatient Glycemic Control (2015)  Target Ranges:  Prepandial:   less than 140 mg/dL      Peak postprandial:   less than 180 mg/dL (1-2 hours)      Critically ill patients:  140 - 180 mg/dL   Lab Results  Component Value Date   GLUCAP 179 (H) 08/15/2023   HGBA1C 10.2 (H) 07/25/2023    Review of Glycemic Control  Latest Reference Range & Units 08/14/23 06:49 08/14/23 12:11 08/14/23 16:31 08/14/23 18:54 08/14/23 21:05 08/15/23 06:03  Glucose-Capillary 70 - 99 mg/dL 960 (H) 454 (H) 098 (H) 257 (H) 235 (H) 179 (H)  (H): Data is abnormally high  Diabetes history: DM2 Outpatient Diabetes medications: Januvia 25 mg QD Current orders for Inpatient glycemic control: Semglee 5 units every day, Novolog 0-9 units TID  Inpatient Diabetes Program Recommendations:    Might consider:  Novolog 2 units TID with meals if she consumes at least 50%.  Addendum@12 :00:  Met with patient and daughter at bedside.  Told her the [facility] 3 is covered with $0 co-pay.  Daughter is downloading the app on her phone.  Will follow up tomorrow.    Will continue to follow while inpatient.  Thank you, Dulce Sellar, MSN, CDCES Diabetes Coordinator Inpatient Diabetes Program [phone] (team pager from 8a-5p)

## 2023-08-15 NOTE — Progress Notes (Signed)
Mobility Specialist Progress Note:   08/15/23 1131  Mobility  Activity Ambulated with assistance in hallway  Level of Assistance Contact guard assist, steadying assist  Assistive Device Front wheel walker  Distance Ambulated (ft) 160 ft  Activity Response Tolerated well  Mobility Referral Yes  $Mobility charge 1 Mobility  Mobility Specialist Start Time (ACUTE ONLY) 1110  Mobility Specialist Stop Time (ACUTE ONLY) 1124  Mobility Specialist Time Calculation (min) (ACUTE ONLY) 14 min   Pre Mobility: 84 HR , 111/57 BP  During Mobility: 106 HR  Post Mobility: 91 HR  Pt received in bed, agreeable to mobility. Standing rest break required d/t slight dizziness, otherwise asx throughout. Pt returned to bed with call bell in reach and all needs met. RN notified.   Leory Plowman  Mobility Specialist Please contact via [employer] office at [phone]

## 2023-08-15 NOTE — Assessment & Plan Note (Signed)
Creatinine 2.03 this morning, increased from 1.3 (baseline) yesterday. Unclear etiology. No concern for dehydration or hypovolemia currently. No nephrotoxic medications currently. Potentially post-renal - c/o intermittent lower back pain and urinary hesitancy - U/A > culture - Bladder scan - am BMP

## 2023-08-15 NOTE — Assessment & Plan Note (Addendum)
Potassium 5.5 initially, given Lokelma 10 g, NovoLog 5 units, Lasix 40 mg in the ED. Trended down 5.1 > 4.4.  I suspect hyperkalemia was due to recent increase of spironolactone dosage from 12.5 mg to 25 mg.  - Holding spironolactone and valsartan-hydrochlorothiazide for now - AM BMP

## 2023-08-15 NOTE — Consult Note (Signed)
Cardiology Consultation   Patient ID: Rachel [name] MRN: [id number]; DOB: 1941/09/13  Admit date: 08/13/2023 Date of Consult: 08/15/2023  PCP:  Rachel Fendt, NP   [facility] Providers Cardiologist:  Rachel Si, MD        Patient Profile:   Rachel [name] is a 82 y.o. female with a hx of nonobstructive CAD, atrial fibrillation with RVR, hypertension, asthma, diabetes who is being seen 08/15/2023 for the evaluation of atrial flutter at the request of .  History of Present Illness:   [name] is followed closely by Dr. Duke [name] and Rachel Shields NP with hypertension clinic. She was initially seen 11/01/2021 in the [facility]. She had a televisit 06/2021 and her blood pressure was 157/87 on amlodipine, losartan, and HCTZ. Amlodipine was increased to 10 mg. She previously had an Echo in 2019 which showed LVEF 65-70% and grade 1 diastolic dysfunction. At her initial visit, she reported home blood pressures averaging 160-187 systolic prior to taking her medications. She had mildly elevated neck veins on exam. She had no symptoms of pheochromocytoma. Given that she was on ARB we did not check for hyperaldosteronism. We restarted her on 5 mg amlodipine daily (previously intolerant of 10 mg dose but was stable on 5 mg). Losartan/HCTZ was switched to valsartan/HCTZ 320/25 mg daily. She was also started on spironolactone 25 mg daily. She was referred to [facility]. Renal artery doppler and echocardiogram were ordered but not completed. Later she reported significant LE swelling, so amlodipine was reduced from 5 mg to 2.5 mg daily. On 01/13/2023 she presented to the ED with complaints of worsening DOE for several days in the setting of months of leg swelling. Noted to be hypertensive in triage but otherwise without distress. BNP was normal. She was discharged on 20 mg furosemide BID.    She was seen 12/2022 and her home blood pressures were  averaging 165-167 systolic. This was similar to her BP in the office. There had been some confusion with her medications and some had been lost from her regime. We discontinued hospital-prescribed diuretics and restarted amlodipine, spironolactone, and valsartan/HCTZ. She had been hesitant to refill her atorvastatin after hearing about potential side effects. She was encouraged to restart her cholesterol medication.   From 6/2-02/27/2023 she was admitted after presenting with syncope and new onset atrial fibrillation with RVR. She also complained of chest pain and troponins were elevated, felt probably due to demand ischemia. She underwent left heart catheterization 02/25/23 showing mild nonobstructive CAD (prox RCA 20%, ost-proxCx 20%, ost-prox LAD 20%, mid LAD 20%). Echo during admission showed LVEF 65 to 70%, no RWMA, normal RV function, no significant valvular abnormalities. She reverted to sinus bradycardia with PO diltiazem and Toprol prior to discharge. She followed up with Rachel Shields, NP 03/08/23 and was maintaining sinus bradycardia and denied palpitations. Patient saw Dr. Duke [name] for follow up on 07/17/23 and Spironolactone increased to 25mg  daily.   Patient admitted on 11/19 after presenting with presyncope. On the morning of admission, patient with dizziness/lightheadedness and chest discomfort while getting dressed/making breakfast. Also admitted fatigue and dyspnea with exertion x2 days (since Saturday). Weakness on Monday was so profound that patient was unable to go downstairs to get her medications including Eliquis. ED ECG showed atrial flutter with RVR, 2:1 conduction. She was placed on a cardizem infusion and rate control achieved with conduction pattern consistent with 4:1 atrial flutter. Labs in the ED also noted hyperkalemia with K 5.5. Improved following lokelma,  IV lasix, novolog. Since admission, telemetry shows predominantly persistent 4:1 atrial flutter. Patient continues to feel  fatigue, though not as severe as time of admission. She also has intermittent chest discomfort with sensation described as similar to when she previously had atrial fibrillation with RVR.  Past Medical History:  Diagnosis Date   (HFpEF) heart failure with preserved ejection fraction (HCC) 01/17/2023   Allergic rhinitis    CAD in native artery 07/17/2023   Diabetes mellitus, type 2 (HCC)    Hyperkalemia 08/14/2023   Hyperlipemia    Hypertension    Left shoulder pain 11/01/2021   Lipoma NEC    anterior upper chest   Osteoarthritis of knee    PAF (paroxysmal atrial fibrillation) (HCC) 02/24/2023   Shortness of breath 11/01/2021    Past Surgical History:  Procedure Laterality Date   ACNE CYST REMOVAL  1970   back   APPENDECTOMY  1963   LEFT HEART CATH AND CORONARY ANGIOGRAPHY N/A 02/25/2023   Procedure: LEFT HEART CATH AND CORONARY ANGIOGRAPHY;  Surgeon: Rachel Hazel, MD;  Location: [facility];  Service: Cardiovascular;  Laterality: N/A;   OOPHORECTOMY  1985   VAGINAL HYSTERECTOMY  1985   complete     Home Medications:  Prior to Admission medications   Medication Sig Start Date End Date Taking? Authorizing Provider  acetaminophen (TYLENOL) 500 MG tablet Take 500 mg by mouth every 8 (eight) hours as needed for mild pain or headache.   Yes [provider]  albuterol (VENTOLIN HFA) 108 (90 Base) MCG/ACT inhaler INHALE 1-2 PUFFS BY MOUTH EVERY 6 HOURS AS NEEDED FOR WHEEZE OR SHORTNESS OF BREATH Patient taking differently: Inhale 1-2 puffs into the lungs every 6 (six) hours as needed for wheezing or shortness of breath. 10/25/22  Yes Rachel [name], Rachel J, NP  apixaban (ELIQUIS) 5 MG TABS tablet Take 1 tablet (5 mg total) by mouth 2 (two) times daily. 03/08/23 09/04/23 Yes Rachel Sorrow, NP  budesonide (PULMICORT) 0.25 MG/2ML nebulizer solution Take 2 mLs (0.25 mg total) by nebulization 2 (two) times daily. 03/04/23  Yes Rachel [name], Rachel J, NP  diclofenac Sodium  (VOLTAREN) 1 % GEL Apply 2 g topically 4 (four) times daily as needed. 11/01/21  Yes Rachel Si, MD  diltiazem (CARDIZEM CD) 120 MG 24 hr capsule Take 1 capsule (120 mg total) by mouth daily. 03/08/23 08/13/23 Yes Rachel Sorrow, NP  fluticasone (FLONASE) 50 MCG/ACT nasal spray Place 2 sprays into both nostrils daily as needed for allergies or rhinitis.   Yes [provider]  ipratropium (ATROVENT HFA) 17 MCG/ACT inhaler Inhale 1 puff into the lungs 3 (three) times daily. 02/24/23 02/24/24 Yes Zhang, Renae Fickle, MD  loratadine (CLARITIN) 10 MG tablet TAKE 1 TABLET BY MOUTH EVERY DAY (DRUG NOT COVERED BY INS) Patient taking differently: Take 10 mg by mouth daily. 05/20/23  Yes Rachel [name], Rachel J, NP  metoprolol succinate (TOPROL-XL) 25 MG 24 hr tablet Take 1 tablet (25 mg total) by mouth at bedtime. 03/08/23 03/02/24 Yes Rachel Sorrow, NP  Multiple Vitamins-Minerals (CENTRUM SILVER 50+WOMEN) TABS Take 1 tablet by mouth daily.   Yes [provider]  nystatin (MYCOSTATIN/NYSTOP) powder Apply topically 4 (four) times daily. 03/17/19  Yes Burns, Bobette Mo, MD  rosuvastatin (CRESTOR) 5 MG tablet Take 1 tablet (5 mg total) by mouth daily. 03/08/23 08/13/23 Yes Rachel Sorrow, NP  spironolactone (ALDACTONE) 25 MG tablet Take 1 tablet (25 mg total) by mouth daily. 07/17/23 08/16/23 Yes Rachel Si,  MD  valsartan-hydrochlorothiazide (DIOVAN-HCT) 320-25 MG tablet Take 1 tablet by mouth daily. 01/17/23  Yes Rachel Si, MD  glucose blood [facility] VERIO) test strip Use to check blood sugars once a day 06/15/22   Georganna Skeans, MD  OneTouch Delica Lancets 30G MISC E11.9 06/15/22   Georganna Skeans, MD  pantoprazole (PROTONIX) 40 MG tablet Take 1 tablet (40 mg total) by mouth daily. Patient not taking: Reported on 08/13/2023 02/28/23 03/30/23  Lanae Boast, MD  sitaGLIPtin (JANUVIA) 25 MG tablet Take 1 tablet (25 mg total) by mouth daily. Patient not taking: Reported on 08/13/2023 07/26/23    Rachel Fendt, NP    Inpatient Medications: Scheduled Meds:  apixaban  5 mg Oral BID   diltiazem  60 mg Oral Q6H   insulin aspart  0-9 Units Subcutaneous TID WC   insulin glargine-yfgn  10 Units Subcutaneous QHS   loratadine  10 mg Oral Daily   rosuvastatin  5 mg Oral Daily   Continuous Infusions:  PRN Meds: fluticasone, mineral oil-hydrophilic petrolatum  Allergies:    Allergies  Allergen Reactions   Atorvastatin Other (See Comments)    Caused aching in the body   Glimepiride Diarrhea   Metformin Diarrhea    Social History:   Social History   Socioeconomic History   Marital status: Widowed    Spouse name: Not on file   Number of children: Not on file   Years of education: Not on file   Highest education level: Not on file  Occupational History   Occupation: department mang    Employer: [employer]    Comment: Retired  Tobacco Use   Smoking status: Never    Passive exposure: Never   Smokeless tobacco: Never  Vaping Use   Vaping status: Never Used  Substance and Sexual Activity   Alcohol use: Yes    Alcohol/week: 1.0 standard drink of alcohol    Types: 1 Glasses of wine per week    Comment: ocassionally   Drug use: No   Sexual activity: Not Currently  Other Topics Concern   Not on file  Social History Narrative   No regular exercise   Widowed   Social Determinants of Health   Financial Resource Strain: Low Risk  (11/01/2021)   Overall Financial Resource Strain (CARDIA)    Difficulty of Paying Living Expenses: Not hard at all  Food Insecurity: No Food Insecurity (08/13/2023)   Hunger Vital Sign    Worried About Running Out of Food in the Last Year: Never true    Ran Out of Food in the Last Year: Never true  Transportation Needs: No Transportation Needs (08/13/2023)   PRAPARE - Administrator, Civil Service (Medical): No    Lack of Transportation (Non-Medical): No  Physical Activity: Inactive (11/01/2021)   Exercise Vital Sign     Days of Exercise per Week: 0 days    Minutes of Exercise per Session: 0 min  Stress: Not on file  Social Connections: Not on file  Intimate Partner Violence: Not At Risk (08/13/2023)   Humiliation, Afraid, Rape, and Kick questionnaire    Fear of Current or Ex-Partner: No    Emotionally Abused: No    Physically Abused: No    Sexually Abused: No    Family History:    Family History  Problem Relation Age of Onset   Diabetes Mother    Heart attack Mother    Cancer Father        brain  Diabetes Sister    Hypertension Sister    Diabetes Brother    Hypertension Brother    Coronary artery disease Brother    Aneurysm Other        niece     ROS:  Please see the history of present illness.   All other ROS reviewed and negative.     Physical Exam/Data:   Vitals:   08/15/23 0400 08/15/23 0600 08/15/23 0604 08/15/23 0748  BP: (!) 124/56  (!) 128/58 124/65  Pulse: 75 75 75 76  Resp: 19 14 16 19   Temp:    (!) 97.5 F (36.4 C)  TempSrc:    Oral  SpO2: 98% 98% 99% 99%  Weight:      Height:        Intake/Output Summary (Last 24 hours) at 08/15/2023 1339 Last data filed at 08/15/2023 0900 Gross per 24 hour  Intake 720 ml  Output 350 ml  Net 370 ml      08/13/2023    8:10 PM 08/13/2023   11:41 AM 07/17/2023    9:11 AM  Last 3 Weights  Weight (lbs) 202 lb 13.2 oz 202 lb 13.2 oz 203 lb 3.2 oz  Weight (kg) 92 kg 92 kg 92.171 kg     Body mass index is 35.93 kg/m.  General:  Well nourished, well developed, in no acute distress HEENT: normal Neck: no JVD Vascular: No carotid bruits; Distal pulses 2+ bilaterally Cardiac:  normal S1, S2; slightly irregular; no murmur  Lungs:  clear to auscultation bilaterally, no wheezing, rhonchi or rales  Abd: soft, nontender, no hepatomegaly  Ext: no edema Musculoskeletal:  No deformities, BUE and BLE strength normal and equal Skin: warm and dry  Neuro:  CNs 2-12 intact, no focal abnormalities noted Psych:  Normal affect   EKG:   The EKG was personally reviewed and demonstrates:  atrial flutter with 4:1 conduction Telemetry:  Telemetry was personally reviewed and demonstrates:  atrial flutter 4:1 conduction (occasionally 3:1).  Relevant CV Studies:  08/14/23 TTE  IMPRESSIONS     1. Left ventricular ejection fraction, by estimation, is 60 to 65%. The  left ventricle has normal function. The left ventricle has no regional  wall motion abnormalities. Left ventricular diastolic function could not  be evaluated.   2. Right ventricular systolic function is normal. The right ventricular  size is normal.   3. The mitral valve is normal in structure. No evidence of mitral valve  regurgitation. No evidence of mitral stenosis.   4. The aortic valve is normal in structure. There is mild calcification  of the aortic valve. Aortic valve regurgitation is not visualized. No  aortic stenosis is present.   5. The inferior vena cava is normal in size with greater than 50%  respiratory variability, suggesting right atrial pressure of 3 mmHg.   FINDINGS   Left Ventricle: Left ventricular ejection fraction, by estimation, is 60  to 65%. The left ventricle has normal function. The left ventricle has no  regional wall motion abnormalities. The left ventricular internal cavity  size was normal in size. There is   no left ventricular hypertrophy. Left ventricular diastolic function  could not be evaluated due to atrial fibrillation. Left ventricular  diastolic function could not be evaluated.   Right Ventricle: The right ventricular size is normal. No increase in  right ventricular wall thickness. Right ventricular systolic function is  normal.   Left Atrium: Left atrial size was normal in size.   Right Atrium:  Right atrial size was normal in size.   Pericardium: There is no evidence of pericardial effusion.   Mitral Valve: The mitral valve is normal in structure. No evidence of  mitral valve regurgitation. No evidence of  mitral valve stenosis.   Tricuspid Valve: The tricuspid valve is normal in structure. Tricuspid  valve regurgitation is trivial. No evidence of tricuspid stenosis.   Aortic Valve: The aortic valve is normal in structure. There is mild  calcification of the aortic valve. Aortic valve regurgitation is not  visualized. No aortic stenosis is present. Aortic valve mean gradient  measures 8.0 mmHg. Aortic valve peak gradient  measures 11.7 mmHg. Aortic valve area, by VTI measures 2.72 cm.   Pulmonic Valve: The pulmonic valve was normal in structure. Pulmonic valve  regurgitation is not visualized. No evidence of pulmonic stenosis.   Aorta: The aortic root is normal in size and structure.   Venous: The inferior vena cava is normal in size with greater than 50%  respiratory variability, suggesting right atrial pressure of 3 mmHg.   IAS/Shunts: No atrial level shunt detected by color flow Doppler.   Laboratory Data:  High Sensitivity Troponin:   Recent Labs  Lab 08/13/23 1156 08/13/23 1430 08/15/23 0955  TROPONINIHS 10 10 8      Chemistry Recent Labs  Lab 08/13/23 1145 08/13/23 2238 08/14/23 0401 08/15/23 0343  NA 135 136 137 131*  K 5.5* 5.1 4.4 4.5  CL 106 105 106 101  CO2 19* 18* 22 23  GLUCOSE 252* 212* 208* 217*  BUN 29* 30* 28* 43*  CREATININE 1.37* 1.44* 1.30* 2.03*  CALCIUM 9.5 9.4 9.3 9.0  MG 1.8  --   --   --   GFRNONAA 39* 37* 42* 25*  ANIONGAP 10 13 9 7     No results for input(s): "PROT", "ALBUMIN", "AST", "ALT", "ALKPHOS", "BILITOT" in the last 168 hours. Lipids No results for input(s): "CHOL", "TRIG", "HDL", "LABVLDL", "LDLCALC", "CHOLHDL" in the last 168 hours.  Hematology Recent Labs  Lab 08/13/23 1145 08/14/23 0401  WBC 9.1 9.3  RBC 4.31 4.12  HGB 11.9* 11.5*  HCT 37.4 35.1*  MCV 86.8 85.2  MCH 27.6 27.9  MCHC 31.8 32.8  RDW 13.9 14.1  PLT 303 301   Thyroid  Recent Labs  Lab 08/13/23 1328  TSH 3.369    BNP Recent Labs  Lab  08/13/23 1328  BNP 171.2*    DDimer No results for input(s): "DDIMER" in the last 168 hours.   Radiology/Studies:  ECHOCARDIOGRAM COMPLETE  Result Date: 08/14/2023    ECHOCARDIOGRAM REPORT   Patient Name:   AAHNA PANETO [facility] Date of Exam: 08/14/2023 Medical Rec #:  [id number]                  Height:       63.0 in Accession #:    [id number]                 Weight:       202.8 lb Date of Birth:  [birth year]                 BSA:          1.945 m Patient Age:    81 years                   BP:           128/80 mmHg Patient Gender: F  HR:           78 bpm. Exam Location:  Inpatient Procedure: 2D Echo, Cardiac Doppler and Color Doppler Indications:    Atrial Fibrillation I48.91  History:        Patient has prior history of Echocardiogram examinations, most                 recent 02/25/2023. CHF, CAD, Arrythmias:Atrial Fibrillation and                 Atrial Flutter; Risk Factors:Hypertension and Diabetes.  Sonographer:    Darlys Gales Referring Phys: [id number] MARGARET E PRAY IMPRESSIONS  1. Left ventricular ejection fraction, by estimation, is 60 to 65%. The left ventricle has normal function. The left ventricle has no regional wall motion abnormalities. Left ventricular diastolic function could not be evaluated.  2. Right ventricular systolic function is normal. The right ventricular size is normal.  3. The mitral valve is normal in structure. No evidence of mitral valve regurgitation. No evidence of mitral stenosis.  4. The aortic valve is normal in structure. There is mild calcification of the aortic valve. Aortic valve regurgitation is not visualized. No aortic stenosis is present.  5. The inferior vena cava is normal in size with greater than 50% respiratory variability, suggesting right atrial pressure of 3 mmHg. FINDINGS  Left Ventricle: Left ventricular ejection fraction, by estimation, is 60 to 65%. The left ventricle has normal function. The left ventricle has no regional  wall motion abnormalities. The left ventricular internal cavity size was normal in size. There is  no left ventricular hypertrophy. Left ventricular diastolic function could not be evaluated due to atrial fibrillation. Left ventricular diastolic function could not be evaluated. Right Ventricle: The right ventricular size is normal. No increase in right ventricular wall thickness. Right ventricular systolic function is normal. Left Atrium: Left atrial size was normal in size. Right Atrium: Right atrial size was normal in size. Pericardium: There is no evidence of pericardial effusion. Mitral Valve: The mitral valve is normal in structure. No evidence of mitral valve regurgitation. No evidence of mitral valve stenosis. Tricuspid Valve: The tricuspid valve is normal in structure. Tricuspid valve regurgitation is trivial. No evidence of tricuspid stenosis. Aortic Valve: The aortic valve is normal in structure. There is mild calcification of the aortic valve. Aortic valve regurgitation is not visualized. No aortic stenosis is present. Aortic valve mean gradient measures 8.0 mmHg. Aortic valve peak gradient measures 11.7 mmHg. Aortic valve area, by VTI measures 2.72 cm. Pulmonic Valve: The pulmonic valve was normal in structure. Pulmonic valve regurgitation is not visualized. No evidence of pulmonic stenosis. Aorta: The aortic root is normal in size and structure. Venous: The inferior vena cava is normal in size with greater than 50% respiratory variability, suggesting right atrial pressure of 3 mmHg. IAS/Shunts: No atrial level shunt detected by color flow Doppler.  LEFT VENTRICLE PLAX 2D LVIDd:         3.50 cm   Diastology LVIDs:         2.00 cm   LV e' medial:    9.36 cm/s LV PW:         1.00 cm   LV E/e' medial:  12.2 LV IVS:        1.00 cm   LV e' lateral:   8.38 cm/s LVOT diam:     1.80 cm   LV E/e' lateral: 13.6 LV SV:         79 LV SV  Index:   41 LVOT Area:     2.54 cm  RIGHT VENTRICLE RV S prime:     11.40 cm/s  TAPSE (M-mode): 2.3 cm LEFT ATRIUM             Index        RIGHT ATRIUM           Index LA Vol (A2C):   54.1 ml 27.81 ml/m  RA Area:     14.70 cm LA Vol (A4C):   37.9 ml 19.48 ml/m  RA Volume:   39.70 ml  20.41 ml/m LA Biplane Vol: 46.5 ml 23.91 ml/m  AORTIC VALVE AV Area (Vmax):    2.10 cm AV Area (Vmean):   1.88 cm AV Area (VTI):     2.72 cm AV Vmax:           171.00 cm/s AV Vmean:          135.000 cm/s AV VTI:            0.290 m AV Peak Grad:      11.7 mmHg AV Mean Grad:      8.0 mmHg LVOT Vmax:         141.00 cm/s LVOT Vmean:        100.000 cm/s LVOT VTI:          0.310 m LVOT/AV VTI ratio: 1.07  AORTA Ao Root diam: 2.40 cm MITRAL VALVE MV Area (PHT): 4.15 cm     SHUNTS MV Decel Time: 183 msec     Systemic VTI:  0.31 m MV E velocity: 114.00 cm/s  Systemic Diam: 1.80 cm MV A velocity: 55.50 cm/s MV E/A ratio:  2.05 Aditya Sabharwal Electronically signed by Dorthula Nettles Signature Date/Time: 08/14/2023/1:52:25 PM    Final    DG Chest 2 View  Result Date: 08/13/2023 CLINICAL DATA:  Syncope EXAM: CHEST - 2 VIEW COMPARISON:  Chest x-ray 02/24/2023 FINDINGS: The heart size and mediastinal contours are within normal limits. Both lungs are clear. The visualized skeletal structures are unremarkable. IMPRESSION: No active cardiopulmonary disease. Electronically Signed   By: Darliss Cheney M.D.   On: 08/13/2023 17:02     Assessment and Plan:   Atrial flutter with RVR Hx paroxysmal afib  Patient with hx paroxysmal fib prompting an admission earlier this year due to near-syncope, admitted with similar symptoms as well as profound fatigue. Found to be in atrial flutter with 2:1 conduction on initial ECG. Now 4:1 flutter on Diltiazem 60mg  Q6hr.   Patient remains fatigued with ongoing flutter, though improved with better rate control. Given profound symptoms with flutter, would recommend attempt at restoring NSR. Patient did miss at least 1 dose of Eliquis early this week due to weakness. Discussed  TEE/DCCV with patient and will tentatively plan for this tomorrow. She wishes to discuss with Dr. Duke [name] and her daughter before confirming consent.  Rates remain reasonably well controlled on Diltiazem 60mg  q6hr. She was previously taking lower dose long acting cardizem CD 120mg  and Metoprolol succinate 25mg . Will review dosing options with Dr. Duke [name]. Continue Eliquis 5mg  BID. Need to explore cost assistance programs. Patient expresses ongoing issues affording.  Non-obstructive CAD  June 2024 TTE with mild non-obstructive CAD. Troponin negative this admission and ECG non-ischemic. Continue Rosuvastatin.  Hypertension with AKI  Patient with longstanding resistant hypertension. Is followed by Dr. Duke [name] in HTN clinic where Spironolactone was recently titrated to 25mg  and Valsartan/hydrochlorothiazide 320-25mg , Toprol XL 25mg , Cardizem CD 120mg  continued.   Patient with  K 5.5 on admission and received IV lasix as well as Lokelma and novolog. Now appears to have small AKI following IV lasix, possible pre-renal AKI.  Will plan to follow renal function tomorrow and resume home meds as able by renal function and BP.   Per primary team: DM type II   Risk Assessment/Risk Scores:          CHA2DS2-VASc Score = 6   This indicates a 9.7% annual risk of stroke. The patient's score is based upon: CHF History: 1 HTN History: 1 Diabetes History: 1 Stroke History: 0 Vascular Disease History: 0 Age Score: 2 Gender Score: 1         For questions or updates, please contact [facility] Please consult [email or web address] for contact info under    Signed, Perlie Gold, PA-C  08/15/2023 1:39 PM

## 2023-08-16 ENCOUNTER — Encounter (HOSPITAL_COMMUNITY): Payer: Self-pay | Admitting: Cardiovascular Disease

## 2023-08-16 ENCOUNTER — Inpatient Hospital Stay (HOSPITAL_COMMUNITY): Payer: PPO

## 2023-08-16 ENCOUNTER — Inpatient Hospital Stay (HOSPITAL_COMMUNITY): Payer: PPO | Admitting: Anesthesiology

## 2023-08-16 ENCOUNTER — Encounter (HOSPITAL_COMMUNITY)
Admission: EM | Disposition: A | Discharge status: Home / Self Care | Payer: Self-pay | Source: Home / Self Care | Attending: Family Medicine

## 2023-08-16 DIAGNOSIS — E875 Hyperkalemia: Secondary | ICD-10-CM | POA: Diagnosis not present

## 2023-08-16 DIAGNOSIS — I483 Typical atrial flutter: Secondary | ICD-10-CM

## 2023-08-16 DIAGNOSIS — I4891 Unspecified atrial fibrillation: Secondary | ICD-10-CM

## 2023-08-16 DIAGNOSIS — I4892 Unspecified atrial flutter: Secondary | ICD-10-CM

## 2023-08-16 DIAGNOSIS — I361 Nonrheumatic tricuspid (valve) insufficiency: Secondary | ICD-10-CM

## 2023-08-16 DIAGNOSIS — R55 Syncope and collapse: Secondary | ICD-10-CM | POA: Diagnosis not present

## 2023-08-16 HISTORY — PX: TRANSESOPHAGEAL ECHOCARDIOGRAM (CATH LAB): EP1270

## 2023-08-16 HISTORY — PX: CARDIOVERSION: EP1203

## 2023-08-16 LAB — GLUCOSE, CAPILLARY
Glucose-Capillary: 185 mg/dL — ABNORMAL HIGH (ref 70–99)
Glucose-Capillary: 191 mg/dL — ABNORMAL HIGH (ref 70–99)
Glucose-Capillary: 195 mg/dL — ABNORMAL HIGH (ref 70–99)
Glucose-Capillary: 210 mg/dL — ABNORMAL HIGH (ref 70–99)
Glucose-Capillary: 233 mg/dL — ABNORMAL HIGH (ref 70–99)

## 2023-08-16 LAB — BASIC METABOLIC PANEL
Anion gap: 10 (ref 5–15)
BUN: 40 mg/dL — ABNORMAL HIGH (ref 8–23)
CO2: 23 mmol/L (ref 22–32)
Calcium: 9.2 mg/dL (ref 8.9–10.3)
Chloride: 103 mmol/L (ref 98–111)
Creatinine, Ser: 1.43 mg/dL — ABNORMAL HIGH (ref 0.44–1.00)
GFR, Estimated: 37 mL/min — ABNORMAL LOW (ref >60)
Glucose, Bld: 191 mg/dL — ABNORMAL HIGH (ref 70–99)
Potassium: 4.5 mmol/L (ref 3.5–5.1)
Sodium: 136 mmol/L (ref 135–145)

## 2023-08-16 LAB — ECHO TEE

## 2023-08-16 SURGERY — TRANSESOPHAGEAL ECHOCARDIOGRAM (TEE) (CATHLAB)
Anesthesia: Monitor Anesthesia Care

## 2023-08-16 MED ORDER — PHENOL 1.4 % MT LIQD
1.0000 | OROMUCOSAL | Status: DC | PRN
  Administered 2023-08-16: 1 via OROMUCOSAL
  Filled 2023-08-16: qty 177

## 2023-08-16 MED ORDER — PROPOFOL 500 MG/50ML IV EMUL
INTRAVENOUS | Status: DC | PRN
  Administered 2023-08-16: 150 ug/kg/min via INTRAVENOUS

## 2023-08-16 MED ORDER — BUTAMBEN-TETRACAINE-BENZOCAINE 2-2-14 % EX AERO
INHALATION_SPRAY | CUTANEOUS | Status: DC | PRN
  Administered 2023-08-16: 1 via TOPICAL

## 2023-08-16 MED ORDER — ACETAMINOPHEN 325 MG PO TABS
650.0000 mg | ORAL_TABLET | Freq: Once | ORAL | Status: AC
  Administered 2023-08-16: 650 mg via ORAL
  Filled 2023-08-16: qty 2

## 2023-08-16 MED ORDER — LIDOCAINE 2% (20 MG/ML) 5 ML SYRINGE
INTRAMUSCULAR | Status: DC | PRN
  Administered 2023-08-16: 100 mg via INTRAVENOUS

## 2023-08-16 MED ORDER — BUTAMBEN-TETRACAINE-BENZOCAINE 2-2-14 % EX AERO
INHALATION_SPRAY | CUTANEOUS | Status: AC
  Filled 2023-08-16: qty 20

## 2023-08-16 MED ORDER — SODIUM CHLORIDE 0.9 % IV SOLN
INTRAVENOUS | Status: DC
Start: 2023-08-16 — End: 2023-08-16

## 2023-08-16 MED ORDER — PROPOFOL 10 MG/ML IV BOLUS
INTRAVENOUS | Status: DC | PRN
  Administered 2023-08-16: 20 mg via INTRAVENOUS
  Administered 2023-08-16: 40 mg via INTRAVENOUS
  Administered 2023-08-16: 60 mg via INTRAVENOUS
  Administered 2023-08-16 (×2): 40 mg via INTRAVENOUS

## 2023-08-16 MED ORDER — INSULIN GLARGINE-YFGN 100 UNIT/ML ~~LOC~~ SOLN
12.0000 [IU] | Freq: Every day | SUBCUTANEOUS | Status: DC
  Administered 2023-08-16: 12 [IU] via SUBCUTANEOUS
  Filled 2023-08-16 (×2): qty 0.12

## 2023-08-16 SURGICAL SUPPLY — 1 items: PAD DEFIB RADIO PHYSIO CONN (PAD) ×2 IMPLANT

## 2023-08-16 NOTE — Anesthesia Preprocedure Evaluation (Addendum)
Anesthesia Evaluation  Patient identified by MRN, date of birth, ID band Patient awake    Reviewed: Allergy & Precautions, NPO status , Patient's Chart, lab work & pertinent test results  History of Anesthesia Complications Negative for: history of anesthetic complications  Airway Mallampati: III  TM Distance: >3 FB Neck ROM: Full    Dental  (+) Dental Advisory Given   Pulmonary shortness of breath, asthma , neg sleep apnea, neg COPD, neg recent URI   Pulmonary exam normal breath sounds clear to auscultation       Cardiovascular hypertension, Pt. on home beta blockers and Pt. on medications (-) angina + CAD (non-obstructive) and +CHF  (-) Past MI, (-) Cardiac Stents and (-) CABG + dysrhythmias Atrial Fibrillation  Rhythm:Regular Rate:Normal  HLD  TTE 08/14/2023: IMPRESSIONS    1. Left ventricular ejection fraction, by estimation, is 60 to 65%. The  left ventricle has normal function. The left ventricle has no regional  wall motion abnormalities. Left ventricular diastolic function could not  be evaluated.   2. Right ventricular systolic function is normal. The right ventricular  size is normal.   3. The mitral valve is normal in structure. No evidence of mitral valve  regurgitation. No evidence of mitral stenosis.   4. The aortic valve is normal in structure. There is mild calcification  of the aortic valve. Aortic valve regurgitation is not visualized. No  aortic stenosis is present.   5. The inferior vena cava is normal in size with greater than 50%  respiratory variability, suggesting right atrial pressure of 3 mmHg.     Neuro/Psych neg Seizures negative neurological ROS     GI/Hepatic negative GI ROS, Neg liver ROS,,,  Endo/Other  diabetes, Type 2    Renal/GU ARFRenal disease     Musculoskeletal  (+) Arthritis , Osteoarthritis,    Abdominal  (+) + obese  Peds  Hematology negative hematology ROS (+)    Anesthesia Other Findings 83F with non-obstructive CAD, PAF, hypertension and diabetes admitted with syncope and atrial flutter with RVR.   TMJ  Reproductive/Obstetrics                             Anesthesia Physical Anesthesia Plan  ASA: 3  Anesthesia Plan: MAC   Post-op Pain Management:    Induction: Intravenous  PONV Risk Score and Plan: 2 and Propofol infusion, TIVA and Treatment may vary due to age or medical condition  Airway Management Planned: Natural Airway and Simple Face Mask  Additional Equipment:   Intra-op Plan:   Post-operative Plan:   Informed Consent: I have reviewed the patients History and Physical, chart, labs and discussed the procedure including the risks, benefits and alternatives for the proposed anesthesia with the patient or authorized representative who has indicated his/her understanding and acceptance.     Dental advisory given  Plan Discussed with: CRNA and Anesthesiologist  Anesthesia Plan Comments: (Discussed with patient risks of MAC including, but not limited to, minor pain or discomfort, hearing people in the room, and possible need for backup general anesthesia. Risks for general anesthesia also discussed including, but not limited to, sore throat, hoarse voice, chipped/damaged teeth, injury to vocal cords, nausea and vomiting, allergic reactions, lung infection, heart attack, stroke, and death. All questions answered. )        Anesthesia Quick Evaluation

## 2023-08-16 NOTE — CV Procedure (Signed)
   TRANSESOPHAGEAL ECHOCARDIOGRAM GUIDED DIRECT CURRENT CARDIOVERSION  NAME:  Alton Falletta    MRN: [id number] DOB:  [birth year]    ADMIT DATE: 08/13/2023  INDICATIONS: Symptomatic atrial flutter  PROCEDURE:   Informed consent was obtained prior to the procedure. The risks, benefits and alternatives for the procedure were discussed and the patient comprehended these risks.  Risks include, but are not limited to, cough, sore throat, vomiting, nausea, somnolence, esophageal and stomach trauma or perforation, bleeding, low blood pressure, aspiration, pneumonia, infection, trauma to the teeth and death.    After a procedural time-out, the oropharynx was anesthetized and the patient was sedated by the anesthesia service. The transesophageal probe was inserted in the esophagus and stomach without difficulty and multiple views were obtained. Anesthesia was monitored by Dr. Freida Busman.   COMPLICATIONS:    Complications: No complications Patient tolerated procedure well.  KEY FINDINGS:  No LAA thrombus.  Normal LVEF, 65% Full Report to follow.   CARDIOVERSION:     Indications:  Symptomatic Atrial Flutter  Procedure Details:  Once the TEE was complete, the patient had the defibrillator pads placed in the anterior and posterior position. Once an appropriate level of sedation was confirmed, the patient was cardioverted x 1 with 200J of biphasic synchronized energy.  The patient converted to NSR.  There were no apparent complications.  The patient had normal neuro status and respiratory status post procedure with vitals stable as recorded elsewhere.  Adequate airway was maintained throughout and vital signs monitored per protocol.  Gerri Spore T. Flora Lipps, MD [facility]  [address], [address] [place], [place] [address] [phone]  11:47 AM

## 2023-08-16 NOTE — Progress Notes (Signed)
Patient Name: Haelynn Becklund Date of Encounter: 08/16/2023 [facility] Cardiologist: Chilton Si, MD   Interval Summary  .    Feeling well.  A little lightheaded walking around the room this am.  Otherwise without complaint.   Vital Signs .    Vitals:   08/16/23 0200 08/16/23 0400 08/16/23 0510 08/16/23 0600  BP:   (!) 109/56   Pulse: 75 76 75 76  Resp: 15 17 15 14   Temp:   (!) 97.5 F (36.4 C)   TempSrc:   Oral   SpO2: 100% 100% 100% 100%  Weight:      Height:        Intake/Output Summary (Last 24 hours) at 08/16/2023 0800 Last data filed at 08/16/2023 0600 Gross per 24 hour  Intake 480 ml  Output 350 ml  Net 130 ml      08/13/2023    8:10 PM 08/13/2023   11:41 AM 07/17/2023    9:11 AM  Last 3 Weights  Weight (lbs) 202 lb 13.2 oz 202 lb 13.2 oz 203 lb 3.2 oz  Weight (kg) 92 kg 92 kg 92.171 kg      Telemetry/ECG    Atrial flutter.  Rate <100 bpm.  - Personally Reviewed  Physical Exam .    VS:  BP (!) 109/56 (BP Location: Right Arm)   Pulse 76   Temp (!) 97.5 F (36.4 C) (Oral)   Resp 14   Ht 5\' 3"  (1.6 m)   Wt 92 kg   SpO2 100%   BMI 35.93 kg/m  , BMI Body mass index is 35.93 kg/m. GENERAL:  Well appearing HEENT: Pupils equal round and reactive, fundi not visualized, oral mucosa unremarkable NECK:  No jugular venous distention, waveform within normal limits, carotid upstroke brisk and symmetric, no bruits, no thyromegaly LUNGS:  Clear to auscultation bilaterally HEART:  Irregularly irregular.  PMI not displaced or sustained,S1 and S2 within normal limits, no S3, no S4, no clicks, no rubs, no murmurs ABD:  Flat, positive bowel sounds normal in frequency in pitch, no bruits, no rebound, no guarding, no midline pulsatile mass, no hepatomegaly, no splenomegaly EXT:  2 plus pulses throughout, no edema, no cyanosis no clubbing SKIN:  No rashes no nodules NEURO:  Cranial nerves II through XII grossly intact, motor grossly  intact throughout PSYCH:  Cognitively intact, oriented to person place and time  Assessment & Plan .     Ms. Ramsell is a 36F with non-obstructive CAD, PAF, hypertension and diabetes admitted with syncope and atrial flutter with RVR.    # Atrial flutter: # Syncope: Ms. Crary was in atrial flutter with RVR on admission.  Rates are now better controlled on diltiazem.  Will transition to 180mg  daily today.  Plan for TEE/DCCV today at 2pm.  She missed a dose of Eliquis.  Given that she now had her second episode, recommend f/u in atrial fibrillation clinic to consider antiarrhythmics and/or ablation.    # Hypertension:  # Hyperkalemia:  Ms. Bobek has struggled to control her BP at home.  Interestingly, it is been well-controlled in the hospital on diltiazem only.  Continue diltiazem and monitor closely for now.  Her home spironolactone and valsartan/hydrochlorothiazide are on hold.   # AKI:  Creatinine now improving.  Avoid further diuresis.   # Non-obstructive CAD:  # Hyperlipidemia:  Not an active issue.  Continue rosuvastatin.  For questions or updates, please contact [facility] Please consult [email or web address] for contact info  under        Signed, Chilton Si, MD

## 2023-08-16 NOTE — H&P (View-Only) (Signed)
Patient Name: Rachel [name] Date of Encounter: 08/16/2023 [facility] Cardiologist: Chilton Si, MD   Interval Summary  .    Feeling well.  A little lightheaded walking around the room this am.  Otherwise without complaint.   Vital Signs .    Vitals:   08/16/23 0200 08/16/23 0400 08/16/23 0510 08/16/23 0600  BP:   (!) 109/56   Pulse: 75 76 75 76  Resp: 15 17 15 14   Temp:   (!) 97.5 F (36.4 C)   TempSrc:   Oral   SpO2: 100% 100% 100% 100%  Weight:      Height:        Intake/Output Summary (Last 24 hours) at 08/16/2023 0800 Last data filed at 08/16/2023 0600 Gross per 24 hour  Intake 480 ml  Output 350 ml  Net 130 ml      08/13/2023    8:10 PM 08/13/2023   11:41 AM 07/17/2023    9:11 AM  Last 3 Weights  Weight (lbs) 202 lb 13.2 oz 202 lb 13.2 oz 203 lb 3.2 oz  Weight (kg) 92 kg 92 kg 92.171 kg      Telemetry/ECG    Atrial flutter.  Rate <100 bpm.  - Personally Reviewed  Physical Exam .    VS:  BP (!) 109/56 (BP Location: Right Arm)   Pulse 76   Temp (!) 97.5 F (36.4 C) (Oral)   Resp 14   Ht 5\' 3"  (1.6 m)   Wt 92 kg   SpO2 100%   BMI 35.93 kg/m  , BMI Body mass index is 35.93 kg/m. GENERAL:  Well appearing HEENT: Pupils equal round and reactive, fundi not visualized, oral mucosa unremarkable NECK:  No jugular venous distention, waveform within normal limits, carotid upstroke brisk and symmetric, no bruits, no thyromegaly LUNGS:  Clear to auscultation bilaterally HEART:  Irregularly irregular.  PMI not displaced or sustained,S1 and S2 within normal limits, no S3, no S4, no clicks, no rubs, no murmurs ABD:  Flat, positive bowel sounds normal in frequency in pitch, no bruits, no rebound, no guarding, no midline pulsatile mass, no hepatomegaly, no splenomegaly EXT:  2 plus pulses throughout, no edema, no cyanosis no clubbing SKIN:  No rashes no nodules NEURO:  Cranial nerves II through XII grossly intact, motor grossly  intact throughout PSYCH:  Cognitively intact, oriented to person place and time  Assessment & Plan .     [name] is a 39F with non-obstructive CAD, PAF, hypertension and diabetes admitted with syncope and atrial flutter with RVR.    # Atrial flutter: # Syncope: [name] was in atrial flutter with RVR on admission.  Rates are now better controlled on diltiazem.  Will transition to 180mg  daily today.  Plan for TEE/DCCV today at 2pm.  She missed a dose of Eliquis.  Given that she now had her second episode, recommend f/u in atrial fibrillation clinic to consider antiarrhythmics and/or ablation.    # Hypertension:  # Hyperkalemia:  [name] has struggled to control her BP at home.  Interestingly, it is been well-controlled in the hospital on diltiazem only.  Continue diltiazem and monitor closely for now.  Her home spironolactone and valsartan/hydrochlorothiazide are on hold.   # AKI:  Creatinine now improving.  Avoid further diuresis.   # Non-obstructive CAD:  # Hyperlipidemia:  Not an active issue.  Continue rosuvastatin.  For questions or updates, please contact [facility] Please consult [email or web address] for contact info  under        Signed, Chilton Si, MD

## 2023-08-16 NOTE — Progress Notes (Signed)
Daily Progress Note Intern Pager: [phone]  Patient name: Rachel [name] Medical record number: [id number] Date of birth: [birth year] Age: 82 y.o. Gender: female  Primary Care Provider: Rema Fendt, NP Consultants: Cardiology Code Status: Full  Pt Overview and Major Events to Date:  11/19 - admitted   Assessment and Plan:  81yo with PMHx paroxysmal afib, HTN, HLD/CAD who presented with near syncope in the setting of afib with RVR.  Plan to have TEE guided cardioversion today with cardiology. Assessment & Plan Atrial fibrillation with RVR (HCC) Patient with history of paroxysmal A-fib. Follows with Dr. Duke Salvia for cardiology. HR has stabilized into 70s on diltiazem drip, still in a-flutter. Echo obtained here unremarkable.  - cardiology consulted, appreciate recs - TEE/DCCV today - Continue home Eliquis 5 mg twice daily - transitioning to diltiazem 180mg  daily today per cardiology - hold home metoprolol for now - Cardiac telemetry - AM BMP Acute kidney injury (HCC) Creatinine 1.43 this morning, improved back to around her baseline of 1.3. Unclear etiology of AKI yesterday. She did have a dose of Lasix when she was admitted, this could have contributed. No nephrotoxic medications currently.  - U/A with leukocytes, overall symptomatic, Ucx pending - Bladder scan unremarkable - am BMP Near syncope Slumped over while sitting on edge of bed, denies LOC.  Denies hitting her head.  Suspect this is related to episode of A-fib with RVR.  No other symptoms of this since admission.  - Continue to monitor symptoms - PT/OT eval and treat DM2 (diabetes mellitus, type 2) (HCC) Januvia 25 mg daily on medication list, however it appears this was a new medication the patient has not picked up yet.  No other diabetic medications and she has never used insulin. CBGs have been in 200s throughout admission. Notes she was on metformin a few months ago but could not tolerate GI  side effects.  - Sensitive sliding scale - increase semglee 12U - CBGs before meals and at bedtime - consider adding mealtime insulin   Chronic and Stable Problems:  Hypertension - holding home valsartan-HCTZ and spironolactone due to hyperkalemia HLD/CAD - continue rosuvastatin 5 mg daily   FEN/GI: heart healthy  PPx: eliquis 5mg  BID  Dispo:Home pending clinical improvement   Subjective:  No acute events overnight.  Patient is anxious about her procedure today.  Objective: Temp:  [97.3 F (36.3 C)-97.7 F (36.5 C)] 97.5 F (36.4 C) (11/22 0510) Pulse Rate:  [74-76] 76 (11/22 0600) Resp:  [14-20] 14 (11/22 0600) BP: (109-152)/(54-63) 109/56 (11/22 0510) SpO2:  [98 %-100 %] 100 % (11/22 0600) Physical Exam: General: No acute distress, resting comfortably Cardiovascular: Irregularly irregular rhythm, regular rate Respiratory: No increased work of breathing on room air  Laboratory: Most recent CBC Lab Results  Component Value Date   WBC 9.3 08/14/2023   HGB 11.5 (L) 08/14/2023   HCT 35.1 (L) 08/14/2023   MCV 85.2 08/14/2023   PLT 301 08/14/2023   Most recent BMP    Latest Ref Rng & Units 08/16/2023    4:03 AM  BMP  Glucose 70 - 99 mg/dL 188   BUN 8 - 23 mg/dL 40   Creatinine 4.16 - 1.00 mg/dL 6.06   Sodium 301 - 601 mmol/L 136   Potassium 3.5 - 5.1 mmol/L 4.5   Chloride 98 - 111 mmol/L 103   CO2 22 - 32 mmol/L 23   Calcium 8.9 - 10.3 mg/dL 9.2     Rachel Gaeta, DO 08/16/2023,  8:58 AM  PGY-1, [facility] FPTS Intern pager: [phone], text pages welcome Secure chat group [facility] Teaching Service

## 2023-08-16 NOTE — Care Management Important Message (Signed)
Important Message  Patient Details  Name: Rachel [name] MRN: [id number] Date of Birth: 03-Jun-1941   Important Message Given:  Yes - Medicare IM     Dorena Bodo 08/16/2023, 3:35 PM

## 2023-08-16 NOTE — Progress Notes (Signed)
Mobility Specialist Progress Note:   08/16/23 1515  Mobility  Activity Refused mobility   Pt refused mobility d/t not feeling well. Will f/u as able.    Leory Plowman  Mobility Specialist Please contact via [employer] office at [phone]

## 2023-08-16 NOTE — Assessment & Plan Note (Addendum)
Januvia 25 mg daily on medication list, however it appears this was a new medication the patient has not picked up yet.  No other diabetic medications and she has never used insulin. CBGs have been in 200s throughout admission. Notes she was on metformin a few months ago but could not tolerate GI side effects.  - Sensitive sliding scale - increase semglee 12U - CBGs before meals and at bedtime - consider adding mealtime insulin

## 2023-08-16 NOTE — Anesthesia Postprocedure Evaluation (Signed)
Anesthesia Post Note  Patient: Rachel [name]  Procedure(s) Performed: TRANSESOPHAGEAL ECHOCARDIOGRAM CARDIOVERSION (CATH LAB)     Patient location during evaluation: PACU Anesthesia Type: MAC Level of consciousness: awake Pain management: pain level controlled Vital Signs Assessment: post-procedure vital signs reviewed and stable Respiratory status: spontaneous breathing, nonlabored ventilation and respiratory function stable Cardiovascular status: stable and blood pressure returned to baseline Postop Assessment: no apparent nausea or vomiting Anesthetic complications: no   No notable events documented.  Last Vitals:  Vitals:   08/16/23 1200 08/16/23 1205  BP: 123/63 122/66  Pulse: 79 77  Resp: (!) 22 18  Temp:    SpO2: 99% 99%    Last Pain:  Vitals:   08/16/23 1157  TempSrc: Temporal  PainSc: 0-No pain                 Linton Rump

## 2023-08-16 NOTE — Inpatient Diabetes Management (Addendum)
Inpatient Diabetes Program Recommendations  AACE/ADA: New Consensus Statement on Inpatient Glycemic Control (2015)  Target Ranges:  Prepandial:   less than 140 mg/dL      Peak postprandial:   less than 180 mg/dL (1-2 hours)      Critically ill patients:  140 - 180 mg/dL   Lab Results  Component Value Date   GLUCAP 195 (H) 08/16/2023   HGBA1C 10.2 (H) 07/25/2023    Review of Glycemic Control  Latest Reference Range & Units 08/15/23 06:03 08/15/23 11:13 08/15/23 16:22 08/15/23 21:04 08/16/23 06:31 08/16/23 10:30 08/16/23 12:56 08/16/23 16:14  Glucose-Capillary 70 - 99 mg/dL 034 (H) 742 (H) 595 (H) 273 (H) 185 (H) 191 (H) 195 (H) 210 (H)   Diabetes history: DM 2 Outpatient Diabetes medications: Januvia 25 mg Daily to start when home Current orders for Inpatient glycemic control:  Semglee 12 units qhs Novolog 0-9 units tid  A1c 10.2% on 07/25/2023  Spoke with pt about diet and CGM placement and operation. Pt will need prescription at time of discharge. May also think about Amaryl 2 mg Daily at time of discharge as pt does not desire to use insulin at this time. Placed Freestyle Libre 3 sensor on right arm.  Discharge: [facility] 3 sensors order #[id number]  Thanks,  Christena Deem RN, MSN, BC-ADM Inpatient Diabetes Coordinator Team Pager [phone] (8a-5p)

## 2023-08-16 NOTE — Assessment & Plan Note (Addendum)
Slumped over while sitting on edge of bed, denies LOC.  Denies hitting her head.  Suspect this is related to episode of A-fib with RVR.  No other symptoms of this since admission.  - Continue to monitor symptoms - PT/OT eval and treat

## 2023-08-16 NOTE — Interval H&P Note (Signed)
History and Physical Interval Note:  08/16/2023 10:13 AM  Rachel [name]  has presented today for surgery, with the diagnosis of afib.  The various methods of treatment have been discussed with the patient and family. After consideration of risks, benefits and other options for treatment, the patient has consented to  Procedure(s): TRANSESOPHAGEAL ECHOCARDIOGRAM (N/A) CARDIOVERSION (CATH LAB) (N/A) as a surgical intervention.  The patient's history has been reviewed, patient examined, no change in status, stable for surgery.  I have reviewed the patient's chart and labs.  Questions were answered to the patient's satisfaction.    NPO for TEE/DCCV.  Gerri Spore T. Flora Lipps, MD, [facility]  [facility]  [address] [place], [place] [address] [phone]  10:13 AM

## 2023-08-16 NOTE — Transfer of Care (Signed)
Immediate Anesthesia Transfer of Care Note  Patient: Rachel [name]  Procedure(s) Performed: TRANSESOPHAGEAL ECHOCARDIOGRAM CARDIOVERSION (CATH LAB)  Patient Location: PACU and Cath Lab  Anesthesia Type:MAC  Level of Consciousness: awake  Airway & Oxygen Therapy: Patient Spontanous Breathing and Patient connected to nasal cannula oxygen  Post-op Assessment: Report given to RN and Post -op Vital signs reviewed and stable  Post vital signs: Reviewed and stable  Last Vitals:  Vitals Value Taken Time  BP    Temp    Pulse    Resp    SpO2      Last Pain:  Vitals:   08/16/23 1046  TempSrc: Temporal  PainSc:          Complications: No notable events documented.

## 2023-08-16 NOTE — Assessment & Plan Note (Addendum)
Creatinine 1.43 this morning, improved back to around her baseline of 1.3. Unclear etiology of AKI yesterday. She did have a dose of Lasix when she was admitted, this could have contributed. No nephrotoxic medications currently.  - U/A with leukocytes, overall symptomatic, Ucx pending - Bladder scan unremarkable - am BMP

## 2023-08-16 NOTE — Evaluation (Signed)
Physical Therapy Evaluation Patient Details Name: Rachel [name] MRN: [id number] DOB: 02-06-1941 Today's Date: 08/16/2023  History of Present Illness  Pt is a 82 y/o female admitted 08/13/23 with dizziness, fatigue and presyncope.  Found to be in A-fib w/ RVR and hyperkalemia.  She underwent TEE and cardioversion on 08/16/23.  PMH - HTN, CAD, DM, PAF, OA, CHF, CKD III, asthma.  Clinical Impression  Patient presents with decreased mobility due to generalized weakness and decreased activity tolerance, decreased balance.  Normally independent living alone in 3 level home completing all IADL's.  She was able to mobilize up to bathroom and in hallway with RW.  She had just had procedure earlier today, but reported less SOB and fatigue than prior.  PT will continue to follow in the acute setting.  Hopeful for continued improvement and d/c home with family support, likely no PT follow up.         If plan is discharge home, recommend the following: Help with stairs or ramp for entrance;Assist for transportation;Assistance with cooking/housework   Can travel by private vehicle        Equipment Recommendations None recommended by PT  Recommendations for Other Services       Functional Status Assessment Patient has had a recent decline in their functional status and demonstrates the ability to make significant improvements in function in a reasonable and predictable amount of time.     Precautions / Restrictions Precautions Precautions: Fall      Mobility  Bed Mobility Overal bed mobility: Needs Assistance Bed Mobility: Supine to Sit, Sit to Supine     Supine to sit: Min assist Sit to supine: Contact guard assist   General bed mobility comments: lifting help for trunk, to supine assist for positioning    Transfers Overall transfer level: Needs assistance Equipment used: None Transfers: Sit to/from Stand Sit to Stand: Contact guard assist           General  transfer comment: for balance    Ambulation/Gait Ambulation/Gait assistance: Contact guard assist, Supervision Gait Distance (Feet): 85 Feet Assistive device: Rolling walker (2 wheels) Gait Pattern/deviations: Step-through pattern, Decreased stride length       General Gait Details: using walker in hallway, some without walker in the room,  Stairs            Wheelchair Mobility     Tilt Bed    Modified Rankin (Stroke Patients Only)       Balance Overall balance assessment: Needs assistance   Sitting balance-Leahy Scale: Good     Standing balance support: No upper extremity supported, During functional activity Standing balance-Leahy Scale: Fair Standing balance comment: washed hands at sink, moving some in room no device, though reportedly feeling a little unsteady                             Pertinent Vitals/Pain Pain Assessment Pain Assessment: 0-10 Pain Score: 8  Pain Location: throat Pain Descriptors / Indicators: Aching Pain Intervention(s): Monitored during session, Premedicated before session    Home Living Family/patient expects to be discharged to:: Private residence Living Arrangements: Alone Available Help at Discharge: Family Type of Home: House Home Access: Stairs to enter Entrance Stairs-Rails: Lawyer of Steps: 6 Alternate Level Stairs-Number of Steps: 8 to go up or down two rails up and one going down Home Layout: Multi-level Home Equipment: Shower seat;Grab bars - tub/shower;Rollator (4 wheels)  Prior Function Prior Level of Function : Independent/Modified Independent;Driving               ADLs Comments: indep cooking/cleaning     Extremity/Trunk Assessment   Upper Extremity Assessment Upper Extremity Assessment: Left hand dominant;RUE deficits/detail RUE Deficits / Details: noted middle finger with limited flexion, reports since her last time in hospital in June, has been very  stiff; otherwise grossly [facility]    Lower Extremity Assessment Lower Extremity Assessment: Generalized weakness       Communication   Communication Communication: No apparent difficulties  Cognition Arousal: Alert Behavior During Therapy: WFL for tasks assessed/performed Overall Cognitive Status: Within Functional Limits for tasks assessed                                          General Comments General comments (skin integrity, edema, etc.): VSS throughout, noted HR maintained in SR in 80's-low 90's with ambulation, BP stable throughout 130's/70's; daughter in the room and supportive    Exercises     Assessment/Plan    PT Assessment Patient needs continued PT services  PT Problem List Decreased strength;Decreased balance;Decreased mobility;Decreased knowledge of use of DME;Decreased safety awareness       PT Treatment Interventions DME instruction;Gait training;Stair training;Functional mobility training;Therapeutic activities;Therapeutic exercise;Balance training;Patient/family education    PT Goals (Current goals can be found in the Care Plan section)  Acute Rehab PT Goals Patient Stated Goal: return to independent PT Goal Formulation: With patient/family Time For Goal Achievement: 08/29/23 Potential to Achieve Goals: Good    Frequency Min 1X/week     Co-evaluation               AM-PAC PT "6 Clicks" Mobility  Outcome Measure Help needed turning from your back to your side while in a flat bed without using bedrails?: A Little Help needed moving from lying on your back to sitting on the side of a flat bed without using bedrails?: A Little Help needed moving to and from a bed to a chair (including a wheelchair)?: A Little Help needed standing up from a chair using your arms (e.g., wheelchair or bedside chair)?: A Little Help needed to walk in hospital room?: A Little Help needed climbing 3-5 steps with a railing? : Total 6 Click Score: 16     End of Session Equipment Utilized During Treatment: Gait belt Activity Tolerance: Patient tolerated treatment well Patient left: in bed;with call bell/phone within reach;with family/visitor present   PT Visit Diagnosis: Muscle weakness (generalized) (M62.81)    Time: 1610-9604 PT Time Calculation (min) (ACUTE ONLY): 25 min   Charges:   PT Evaluation $PT Eval Moderate Complexity: 1 Mod PT Treatments $Gait Training: 8-22 mins PT General Charges $$ ACUTE PT VISIT: 1 Visit         Sheran Lawless, PT Acute Rehabilitation Services Office:[phone] 08/16/2023   Elray Mcgregor 08/16/2023, 4:57 PM

## 2023-08-16 NOTE — Assessment & Plan Note (Signed)
Patient with history of paroxysmal A-fib. Follows with Dr. Duke Salvia for cardiology. HR has stabilized into 70s on diltiazem drip, still in a-flutter. Echo obtained here unremarkable.  - cardiology consulted, appreciate recs - TEE/DCCV today - Continue home Eliquis 5 mg twice daily - transitioning to diltiazem 180mg  daily today per cardiology - hold home metoprolol for now - Cardiac telemetry - AM BMP

## 2023-08-17 ENCOUNTER — Other Ambulatory Visit (HOSPITAL_COMMUNITY): Payer: Self-pay

## 2023-08-17 DIAGNOSIS — I4891 Unspecified atrial fibrillation: Secondary | ICD-10-CM | POA: Diagnosis not present

## 2023-08-17 LAB — GLUCOSE, CAPILLARY: Glucose-Capillary: 124 mg/dL — ABNORMAL HIGH (ref 70–99)

## 2023-08-17 MED ORDER — ACETAMINOPHEN 325 MG PO TABS
650.0000 mg | ORAL_TABLET | Freq: Four times a day (QID) | ORAL | Status: DC | PRN
  Administered 2023-08-17: 650 mg via ORAL

## 2023-08-17 MED ORDER — DILTIAZEM HCL ER COATED BEADS 180 MG PO CP24
180.0000 mg | ORAL_CAPSULE | Freq: Every day | ORAL | 0 refills | Status: DC
  Filled 2023-08-17: qty 30, 30d supply, fill #0

## 2023-08-17 NOTE — Assessment & Plan Note (Addendum)
Januvia 25 mg daily on medication list, however it appears this was a new medication the patient has not picked up yet.  No other diabetic medications and she has never used insulin. CBGs have been in 200s throughout admission. Notes she was on metformin a few months ago but could not tolerate GI side effects. Received 7 u SA yesterday and 12 LA. Morning CBG 124. - Sensitive sliding scale - Cont semglee 12U - CBGs before meals and at bedtime - Consider adding mealtime insulin

## 2023-08-17 NOTE — Assessment & Plan Note (Addendum)
Patient with history of paroxysmal A-fib. Follows with Dr. Duke Salvia for cardiology. HR has stabilized into 70s on diltiazem drip. Echo obtained here unremarkable. TEE on 11/22: successful cardioversion to sinus rhythm, no LA/LAA thrombus identified.  Sinus rhythm seen on telemetry overnight 11/22-11/23.  Few PACs, otherwise unremarkable EKG morning of 11/23. - Appreciate ongoing Cardiology recs  - Continue home Eliquis 5 mg twice daily - Continue diltiazem 180mg  daily per cardiology - Hold home metoprolol for now - Cardiac telemetry - AM BMP

## 2023-08-17 NOTE — Discharge Summary (Addendum)
Family Medicine Teaching [facility] Discharge Summary  Patient name: Rachel [name] Medical record number: [id number] Date of birth: 1940-11-05 Age: 82 y.o. Gender: female Date of Admission: 08/13/2023  Date of Discharge: 08/17/23 Admitting Physician: Para March, DO  Primary Care Provider: Rema Fendt, NP Consultants: Cardiology  Indication for Hospitalization: A-fib with RVR  Discharge Diagnoses/Problem List:  Principal Problem for Admission: A-fib with RVR Other Problems addressed during stay:  Principal Problem:   Atrial fibrillation with RVR (HCC) Active Problems:   DM2 (diabetes mellitus, type 2) (HCC)   Near syncope   Acute kidney injury (HCC)   Typical atrial flutter [facility])   Brief Hospital Course:  [name] is a 82 y.o.female with a history of pAfib on eliquis, HFpEF, CAD, T2DM, HLD, HTN who was admitted to the [facility] Medicine Teaching Service at [facility] for presyncope in the setting of A-fib with RVR. Her hospital course is detailed below:  Atrial fibrillation with RVR History of paroxysmal A-fib on Eliquis 5, Toprol 25, diltiazem 120 at home.  Recent hospitalization in June with similar presentation.  Started on diltiazem drip with improvement in heart rate and transitioned to p.o. diltiazem. Increased diltiazem of 180 daily per cardiology. Despite rate control, patient remained in atrial flutter persistently. Cardiology performed TEE/DCCV which patient tolerated well. Patient remained stable in SR thereafter. Discharged on 180 diltiazem daily.   Presyncope Near syncope while sitting prior to admission.  No fall.  Etiology likely A-fib with RVR.  Normal neuro exam, negative troponins, negative chest x-ray. Improved with rate control. By time of discharge, patient able to ambulate independently without issue per PT.  Hyperkalemia/AKI K of 5.5 on arrival.  Likely secondary to spironolactone increase recently.  No EKG changes.   Held valsartan-HCTZ and given Lokelma, Lovenox, Lasix with improvement.  Patient then developed AKI with creatinine of 2.03 on her second day of admission, thought to be from the dose of Lasix she previously received.  Creatinine trended back down afterwards to 1.43.  Type 2 diabetes mellitus Previously started on Januvia but patient has not filled this rx. CBGs in 200s during admission, managed with sensitive sliding scale and Semglee 12 units daily. Resumed plan for Januvia at discharge, no insulin.   Hypertension Held home Metoprolol, Spironolactone, and Valsartan-hctz during admission. Did not resume these medications on discharge as patient had DBP 30-50s throughout admission.  Would not resume spironolactone due to hyperkalemia on admission.  PCP to assess resuming valsartan-HCTZ.  Other chronic conditions were medically managed with home medications and formulary alternatives as necessary (HLD/CAD)  PCP Follow-up Recommendations: Follow-up with cardiology for A-fib management - has appt scheduled with Afib clinic  Optimize DM medications, assess for medication adherence Assess BP medications and resume as needed   Disposition: Home  Discharge Condition: Improved and stable  Discharge Exam:  Vitals:   08/17/23 0830 08/17/23 1151  BP: (!) 134/36 (!) 151/52  Pulse:  64  Resp: 18 15  Temp: (!) 97.5 F (36.4 C) 97.9 F (36.6 C)  SpO2:  100%   General: Well-appearing. Resting comfortably in room. CV: Normal S1/S2. No extra heart sounds. Warm and well-perfused. Pulm: Breathing comfortably on room air. CTAB. No increased WOB. Abd: Soft, non-tender, non-distended. Ext: No lower extremity edema noted.  Nontender, nonerythematous. Skin:  Warm, dry. Psych: Pleasant and appropriate.   Significant Procedures: TEE with cardioversion (08/16/23)  Significant Labs and Imaging:   Recent Labs  Lab 08/16/23 0403  NA 136  K 4.5  CL 103  CO2 23  GLUCOSE 191*  BUN 40*  CREATININE  1.43*  CALCIUM 9.2   Lab Results  Component Value Date   WBC 9.3 08/14/2023   HGB 11.5 (L) 08/14/2023   HCT 35.1 (L) 08/14/2023   MCV 85.2 08/14/2023   PLT 301 08/14/2023   Lab Results  Component Value Date   TSH 3.369 08/13/2023   BNP    Component Value Date/Time   BNP 171.2 (H) 08/13/2023 1328    CXR (11/19): No active cardiopulmonary disease.  TEE (11/22): successful cardioversion to sinus rhythm, no LA/LAA thrombus identified.   EKG (11/23): Some PACs, otherwise normal  Results/Tests Pending at Time of Discharge: None  Discharge Medications:  Allergies as of 08/17/2023       Reactions   Atorvastatin Other (See Comments)   Caused aching in the body   Glimepiride Diarrhea   Metformin Diarrhea        Medication List     STOP taking these medications    metoprolol succinate 25 MG 24 hr tablet Commonly known as: TOPROL-XL   pantoprazole 40 MG tablet Commonly known as: PROTONIX   spironolactone 25 MG tablet Commonly known as: ALDACTONE   valsartan-hydrochlorothiazide 320-25 MG tablet Commonly known as: DIOVAN-HCT       TAKE these medications    acetaminophen 500 MG tablet Commonly known as: TYLENOL Take 500 mg by mouth every 8 (eight) hours as needed for mild pain or headache.   albuterol 108 (90 Base) MCG/ACT inhaler Commonly known as: VENTOLIN HFA INHALE 1-2 PUFFS BY MOUTH EVERY 6 HOURS AS NEEDED FOR WHEEZE OR SHORTNESS OF BREATH What changed: See the new instructions.   apixaban 5 MG Tabs tablet Commonly known as: ELIQUIS Take 1 tablet (5 mg total) by mouth 2 (two) times daily.   budesonide 0.25 MG/2ML nebulizer solution Commonly known as: PULMICORT Take 2 mLs (0.25 mg total) by nebulization 2 (two) times daily.   Centrum Silver 50+Women Tabs Take 1 tablet by mouth daily.   diclofenac Sodium 1 % Gel Commonly known as: VOLTAREN Apply 2 g topically 4 (four) times daily as needed.   diltiazem 180 MG 24 hr capsule Commonly known as:  CARDIZEM CD Take 1 capsule (180 mg total) by mouth daily. What changed:  medication strength how much to take   fluticasone 50 MCG/ACT nasal spray Commonly known as: FLONASE Place 2 sprays into both nostrils daily as needed for allergies or rhinitis.   ipratropium 17 MCG/ACT inhaler Commonly known as: ATROVENT HFA Inhale 1 puff into the lungs 3 (three) times daily.   loratadine 10 MG tablet Commonly known as: CLARITIN TAKE 1 TABLET BY MOUTH EVERY DAY (DRUG NOT COVERED BY INS) What changed: See the new instructions.   nystatin powder Commonly known as: MYCOSTATIN/NYSTOP Apply topically 4 (four) times daily.   OneTouch Delica Lancets 30G Misc E11.9   OneTouch Verio test strip Generic drug: glucose blood Use to check blood sugars once a day   rosuvastatin 5 MG tablet Commonly known as: CRESTOR Take 1 tablet (5 mg total) by mouth daily.   sitaGLIPtin 25 MG tablet Commonly known as: Januvia Take 1 tablet (25 mg total) by mouth daily.        Discharge Instructions: Please refer to Patient Instructions section of EMR for full details.  Patient was counseled important signs and symptoms that should prompt return to medical care, changes in medications, dietary instructions, activity restrictions, and follow up appointments.   Follow-Up Appointments:  Future Appointments  Date Time Provider Department Center  08/20/2023  9:20 AM Rema Fendt, NP [facility] None  08/21/2023  2:00 PM Eustace Pen, PA-C [facility] None  09/04/2023 10:30 AM Gearldine Bienenstock, PA-C [facility] None  11/18/2023  9:00 AM Chilton Si, MD [facility]   Ivery Quale PGY-1, [facility]   I reviewed the medical decision making and verified the service and findings are accurately documented in the resident's note.  Erick Alley, DO 08/17/2023 8:07 PM

## 2023-08-17 NOTE — Progress Notes (Signed)
Progress Note  Patient Name: Rachel [name] Date of Encounter: 08/17/2023  Primary Cardiologist: Chilton Si, MD   Subjective   Patient is seen and examined at his bedside. Her granddaughter was by her bedside  Inpatient Medications    Scheduled Meds:  apixaban  5 mg Oral BID   diltiazem  180 mg Oral Daily   insulin aspart  0-9 Units Subcutaneous TID WC   insulin glargine-yfgn  12 Units Subcutaneous QHS   loratadine  10 mg Oral Daily   rosuvastatin  5 mg Oral Daily   Continuous Infusions:  PRN Meds: acetaminophen, fluticasone, mineral oil-hydrophilic petrolatum, phenol   Vital Signs    Vitals:   08/17/23 0013 08/17/23 0310 08/17/23 0830 08/17/23 1151  BP: (!) 126/50 (!) 126/50 (!) 134/36 (!) 151/52  Pulse:    64  Resp: 17 19 18 15   Temp: 98.1 F (36.7 C) 98 F (36.7 C) (!) 97.5 F (36.4 C) 97.9 F (36.6 C)  TempSrc: Oral Oral Oral Oral  SpO2: 100% 98%  100%  Weight:      Height:        Intake/Output Summary (Last 24 hours) at 08/17/2023 1310 Last data filed at 08/17/2023 0310 Gross per 24 hour  Intake 240 ml  Output --  Net 240 ml   Filed Weights   08/13/23 1141 08/13/23 2010  Weight: 92 kg 92 kg    Telemetry    Sinus rhythm today - Personally Reviewed  ECG     - Personally Reviewed  Physical Exam    General: Comfortable Head: Atraumatic, normal size  Eyes: PEERLA, EOMI  Neck: Supple, normal JVD Cardiac: Normal S1, S2; RRR; no murmurs, rubs, or gallops Lungs: Clear to auscultation bilaterally Abd: Soft, nontender, no hepatomegaly  Ext: warm, no edema Musculoskeletal: No deformities, BUE and BLE strength normal and equal Skin: Warm and dry, no rashes   Neuro: Alert and oriented to person, place, time, and situation, CNII-XII grossly intact, no focal deficits  Psych: Normal mood and affect   Labs    Chemistry Recent Labs  Lab 08/14/23 0401 08/15/23 0343 08/16/23 0403  NA 137 131* 136  K 4.4 4.5 4.5  CL 106 101  103  CO2 22 23 23   GLUCOSE 208* 217* 191*  BUN 28* 43* 40*  CREATININE 1.30* 2.03* 1.43*  CALCIUM 9.3 9.0 9.2  GFRNONAA 42* 25* 37*  ANIONGAP 9 7 10      Hematology Recent Labs  Lab 08/13/23 1145 08/14/23 0401  WBC 9.1 9.3  RBC 4.31 4.12  HGB 11.9* 11.5*  HCT 37.4 35.1*  MCV 86.8 85.2  MCH 27.6 27.9  MCHC 31.8 32.8  RDW 13.9 14.1  PLT 303 301    Cardiac EnzymesNo results for input(s): "TROPONINI" in the last 168 hours. No results for input(s): "TROPIPOC" in the last 168 hours.   BNP Recent Labs  Lab 08/13/23 1328  BNP 171.2*     DDimer No results for input(s): "DDIMER" in the last 168 hours.   Radiology    ECHO TEE  Result Date: 08/16/2023    TRANSESOPHOGEAL ECHO REPORT   Patient Name:   Rachel [name] [facility] Date of Exam: 08/16/2023 Medical Rec #:  [id number]                  Height:       63.0 in Accession #:    [id number]  Weight:       202.8 lb Date of Birth:  1941-03-04                 BSA:          1.945 m Patient Age:    82 years                   BP:           154/116 mmHg Patient Gender: F                          HR:           110 bpm. Exam Location:  Inpatient Procedure: Transesophageal Echo, Color Doppler and Cardiac Doppler Indications:     Arrhythmia  History:         Patient has prior history of Echocardiogram examinations, most                  recent 08/14/2023. Abnormal ECG.  Sonographer:     Darlys Gales Referring Phys:  [id number] Ronnald Ramp O'NEAL Diagnosing Phys: Lennie Odor MD PROCEDURE: After discussion of the risks and benefits of a TEE, an informed consent was obtained from the patient. TEE procedure time was 4 minutes. The transesophogeal probe was passed without difficulty through the esophogus of the patient. Imaged were  obtained with the patient in a left lateral decubitus position. Sedation performed by different physician. The patient was monitored while under deep sedation. Anesthestetic sedation was provided  intravenously by Anesthesiology: 450mg  of Propofol, 100mg   of Lidocaine. Image quality was excellent. The patient's vital signs; including heart rate, blood pressure, and oxygen saturation; remained stable throughout the procedure. The patient developed no complications during the procedure. A successful direct  current cardioversion was performed at 200 joules with 1 attempt.  IMPRESSIONS  1. Left ventricular ejection fraction, by estimation, is 60 to 65%. The left ventricle has normal function.  2. Right ventricular systolic function is normal. The right ventricular size is normal.  3. Left atrial size was mildly dilated. No left atrial/left atrial appendage thrombus was detected. The LAA emptying velocity was 38 cm/s.  4. The mitral valve is grossly normal. Trivial mitral valve regurgitation. No evidence of mitral stenosis.  5. The aortic valve is tricuspid. Aortic valve regurgitation is not visualized. No aortic stenosis is present. Conclusion(s)/Recommendation(s): No LA/LAA thrombus identified. Successful cardioversion performed with restoration of normal sinus rhythm. FINDINGS  Left Ventricle: Left ventricular ejection fraction, by estimation, is 60 to 65%. The left ventricle has normal function. The left ventricular internal cavity size was normal in size. Right Ventricle: The right ventricular size is normal. No increase in right ventricular wall thickness. Right ventricular systolic function is normal. Left Atrium: Left atrial size was mildly dilated. No left atrial/left atrial appendage thrombus was detected. The LAA emptying velocity was 38 cm/s. Right Atrium: Right atrial size was normal in size. Pericardium: Trivial pericardial effusion is present. Mitral Valve: The mitral valve is grossly normal. Trivial mitral valve regurgitation. No evidence of mitral valve stenosis. Tricuspid Valve: The tricuspid valve is grossly normal. Tricuspid valve regurgitation is mild . No evidence of tricuspid stenosis.  Aortic Valve: The aortic valve is tricuspid. Aortic valve regurgitation is not visualized. No aortic stenosis is present. Pulmonic Valve: The pulmonic valve was grossly normal. Pulmonic valve regurgitation is trivial. No evidence of pulmonic stenosis. Aorta: The aortic root and ascending aorta are structurally normal, with  no evidence of dilitation. There is minimal (Grade I) layered plaque involving the descending aorta and aortic arch. Venous: The left upper pulmonary vein, left lower pulmonary vein, right lower pulmonary vein and right upper pulmonary vein are normal. IAS/Shunts: The atrial septum is grossly normal.   AORTA Ao Root diam: 2.35 cm Ao Asc diam:  3.06 cm Lennie Odor MD Electronically signed by Lennie Odor MD Signature Date/Time: 08/16/2023/1:43:11 PM    Final    EP STUDY  Result Date: 08/16/2023 See surgical note for result.   Cardiac Studies   Echo   Patient Profile     82 y.o. female with recent diagnosis of atrial fibrillation  Assessment & Plan    Paroxysmal atrial fibrillation-now in sinus rhythm after her TEE cardioversion.  Continue the Cardizem.  Advised the patient no interruption of her anticoagulation for the next 4 weeks.  She is asymptomatic now she feels better in terms of the shortness of breath. Blood pressure is acceptable, continue with current antihypertensive regimen. Hyperlipidemia - continue with current statin medication.  Potassium within normal limits okay to restart valsartan HCTZ.  Hold Aldactone until she follow-up in the outpatient setting.  From a cardiovascular standpoint I would love to see her work with physical therapy prior to discharge.  Defer to primary team for time of discharge.  We will sign off at this time.   For questions or updates, please contact [facility] Please consult [email or web address] for contact info under Cardiology/STEMI.      Signed, Jaeceon Michelin, DO  08/17/2023, 1:10 PM

## 2023-08-17 NOTE — Assessment & Plan Note (Addendum)
Creatinine elevated to 1.43 during admission, improved back to around her baseline of 1.3. Unclear etiology of AKI noted on 11/21. She did have a dose of Lasix when she was admitted, this could have contributed. No nephrotoxic medications currently. U/A with leukocytes, overall symptomatic. Bladder scan unremarkable. - AM BMP

## 2023-08-17 NOTE — Discharge Instructions (Addendum)
Dear Rachel [name],  Thank you for letting us participate in your care. You were hospitalized for irregular heartbeats and underwent a procedure to help with this.   POST-HOSPITAL & CARE INSTRUCTIONS Hold off on taking your blood pressure medications (Metoprolol, Spironolactone, and Valsartan) until you see your PCP on 08/20/23. Pick up your prescription for Januvia and begin taking as prescribed. Plan to further discuss your diabetes medications with your PCP.  Let your PCP or Cardiologist know if you start experiencing irregular heartbeats.  Please go to all of your follow up appointments (listed below)  DOCTOR'S APPOINTMENT   Future Appointments  Date Time Provider Department Center  08/20/2023  9:20 AM Rema Fendt, NP [facility] None  08/21/2023  2:00 PM Eustace Pen, PA-C [facility] None  09/04/2023 10:30 AM Gearldine Bienenstock, PA-C [facility] None  11/18/2023  9:00 AM Chilton Si, MD [facility]    Take care and be well!  Family Medicine Teaching Service Inpatient Team [place]  [facility]  [address] [place], [place] [address] [phone]

## 2023-08-17 NOTE — Evaluation (Signed)
Occupational Therapy Evaluation Patient Details Name: Rachel [name] MRN: [id number] DOB: April 21, 1941 Today's Date: 08/17/2023   History of Present Illness Pt is a 82 y/o female admitted 08/13/23 with dizziness, fatigue and presyncope.  Found to be in A-fib w/ RVR and hyperkalemia.  She underwent TEE and cardioversion on 08/16/23.  PMH - HTN, CAD, DM, PAF, OA, CHF, CKD III, asthma.   Clinical Impression   At baseline, pt is Independent with ADLs, IADLs, and functional mobility without an AD. Pt now presents with decreased activity tolerance, decreased B UE strength, decreased balance during functional tasks, and decreased safety and independence with functional tasks. Pt currently demonstrates ability to complete ADLs Independent to Contact guard assist. Pt participated well in session and is motivated to return to baseline PLOF. During session, OT educated pt and her granddaughter in techniques for increased safety and independence in functional tasks, improved ongoing management of health condition, B UE/LE HEP for increase strength and activity tolerance for carryover to functional tasks, and strategies for improving and maintaining general health (see details in General Comments below). Pt and granddaughter verbalized and demonstrated understanding through teach back. Pt will benefit from reinforcement of all education. Plan for pt to discharge home this day with pt functioanannal level adequate for discharge at this time. If pt does not discharge as planned, she will benefit from continued acute skilled OT services to address deficits outlined below and increase safety and independence with functional tasks. No post acute OT follow up is indicated at this time. However, pt may be a good candidate for a PACE program to help pt stay active and manage ongoing healthcare needs.       If plan is discharge home, recommend the following: A little help with walking and/or transfers;A little  help with bathing/dressing/bathroom;Assistance with cooking/housework;Assist for transportation;Help with stairs or ramp for entrance    Functional Status Assessment  Patient has had a recent decline in their functional status and demonstrates the ability to make significant improvements in function in a reasonable and predictable amount of time.  Equipment Recommendations  Other (comment) (RW)    Recommendations for Other Services       Precautions / Restrictions Precautions Precautions: Fall Restrictions Weight Bearing Restrictions: No      Mobility Bed Mobility Overal bed mobility: Needs Assistance Bed Mobility: Supine to Sit     Supine to sit: Min assist, HOB elevated     General bed mobility comments: with increased time    Transfers Overall transfer level: Needs assistance Equipment used: Rolling walker (2 wheels) Transfers: Sit to/from Stand, Bed to chair/wheelchair/BSC Sit to Stand: Supervision, Contact guard assist     Step pivot transfers: Supervision, Contact guard assist     General transfer comment: Close Supervision to Contact guard assist for safety and occasional light balance assist      Balance Overall balance assessment: Needs assistance Sitting-balance support: No upper extremity supported, Feet supported Sitting balance-Leahy Scale: Good     Standing balance support: No upper extremity supported, Single extremity supported, During functional activity, Bilateral upper extremity supported Standing balance-Leahy Scale: Fair                             ADL either performed or assessed with clinical judgement   ADL Overall ADL's : Needs assistance/impaired;Modified independent Eating/Feeding: Independent;Sitting   Grooming: Supervision/safety;Standing   Upper Body Bathing: Supervision/ safety;Sitting   Lower Body Bathing: Contact guard assist;Sitting/lateral leans;Sit  to/from stand Lower Body Bathing Details (indicate cue type  and reason): with increased time Upper Body Dressing : Modified independent;Sitting   Lower Body Dressing: Contact guard assist;Sitting/lateral leans;Sit to/from stand Lower Body Dressing Details (indicate cue type and reason): with increased time Toilet Transfer: Supervision/safety;Rolling walker (2 wheels);Contact guard assist;Ambulation;Comfort height toilet Toilet Transfer Details (indicate cue type and reason): occasional cues for hand placement/technique with RW Toileting- Clothing Manipulation and Hygiene: Supervision/safety;Contact guard assist;Sitting/lateral lean;Sit to/from stand       Functional mobility during ADLs: Contact guard assist;Rolling walker (2 wheels) General ADL Comments: Pt with mildly decreased activity tolerance during functional tasks.     Vision         Perception         Praxis         Pertinent Vitals/Pain Pain Assessment Pain Assessment: No/denies pain Pain Intervention(s): Monitored during session     Extremity/Trunk Assessment Upper Extremity Assessment Upper Extremity Assessment: RUE deficits/detail;Left hand dominant;Overall [facility] for tasks assessed (overall strength 4/5, overall ROM and coordination WFL) RUE Deficits / Details: mildly decreased middle finger flexion at baseline   Lower Extremity Assessment Lower Extremity Assessment: Defer to PT evaluation       Communication Communication Communication: No apparent difficulties   Cognition Arousal: Alert Behavior During Therapy: WFL for tasks assessed/performed Overall Cognitive Status: Within Functional Limits for tasks assessed                                 General Comments: AAOx4 and pleasant throughout. Able to follow multi-step instructions consistently.     General Comments  OT educated pt and grandaughter in tracking HR and O2 with pulse oximeter, strategies for tracking possible symptoms of AFib, strategies for ensuring pt has adequate food and water  intake throughout the day, strategies for medication management, and B UE/LE HEP for general strength, ROM, and activity tolerance for carryover to funcitonal tasks. Pt and grandaughter verbalized and demonstrated understanding of all education through teach back. VSS on RA throughout session with HR in the 60s throughout. Pt's grandaughter present throughout session.    Exercises Exercises: General Upper Extremity, General Lower Extremity General Exercises - Upper Extremity Shoulder Flexion: AROM, Both, Strengthening, 10 reps, Seated (sitting EOB; increased activity tolerance; 2 sets) Shoulder Extension: AROM, Strengthening, Both, 10 reps, Seated (sitting EOB; increased activity tolerance; 2 sets) Shoulder ABduction: AROM, Strengthening, Both, 10 reps, Seated (sitting EOB; increased activity tolerance; 2 sets) Shoulder ADduction: AROM, Strengthening, Both, 10 reps, Seated (sitting EOB; increased activity tolerance; 2 sets) Shoulder Horizontal ABduction: AROM, Strengthening, Both, 10 reps, Seated (sitting EOB; increased activity tolerance; 2 sets) Shoulder Horizontal ADduction: AROM, Strengthening, Both, 10 reps, Seated (sitting EOB; increased activity tolerance; 2 sets) General Exercises - Lower Extremity Straight Leg Raises: AROM, Strengthening, Both, 10 reps, Seated (sitting EOB; increased activity tolerance; 2 sets) Hip Flexion/Marching: AROM, Strengthening, Both, 10 reps, Seated (sitting EOB; increased activity tolerance; 2 sets)   Shoulder Instructions      Home Living Family/patient expects to be discharged to:: Private residence Living Arrangements: Alone Available Help at Discharge: Family;Available PRN/intermittently (Family can be available 24 hours a day for a short time if needed) Type of Home: House Home Access: Stairs to enter [employer] of Steps: 6 Entrance Stairs-Rails: Left;Right Home Layout: Multi-level Alternate Level Stairs-Number of Steps: 8 to go up or  down two rails up and one going down Alternate Level Stairs-Rails: Right Bathroom  Shower/Tub: Tub/shower unit;Walk-in shower (uses tub/shower)   Firefighter: Handicapped height     Home Equipment: Shower seat;Grab bars - tub/shower;Rollator (4 wheels)          Prior Functioning/Environment Prior Level of Function : Independent/Modified Independent;Driving             Mobility Comments: No assistive device ADLs Comments: indep with ADLs and IADLs        OT Problem List: Decreased strength;Decreased activity tolerance;Impaired balance (sitting and/or standing)      OT Treatment/Interventions: Self-care/ADL training;Therapeutic exercise;Energy conservation;DME and/or AE instruction;Therapeutic activities;Patient/family education    OT Goals(Current goals can be found in the care plan section) Acute Rehab OT Goals Patient Stated Goal: to stay active and have fewer AFib episodes OT Goal Formulation: With patient/family Time For Goal Achievement: 08/31/23 Potential to Achieve Goals: Good ADL Goals Pt Will Perform Lower Body Bathing: with modified independence;sitting/lateral leans;sit to/from stand Pt Will Perform Lower Body Dressing: with modified independence;sitting/lateral leans;sit to/from stand Pt Will Transfer to Toilet: with modified independence;regular height toilet (with least restrictive AD) Pt Will Perform Toileting - Clothing Manipulation and hygiene: with modified independence;sit to/from stand;sitting/lateral leans Pt/caregiver will Perform Home Exercise Program: Increased strength;Both right and left upper extremity;Independently;With written HEP provided (AROM progressing to theraband; increased activity tolerance)  OT Frequency: Min 1X/week    Co-evaluation              AM-PAC OT "6 Clicks" Daily Activity     Outcome Measure Help from another person eating meals?: None Help from another person taking care of personal grooming?: A Little Help  from another person toileting, which includes using toliet, bedpan, or urinal?: A Little Help from another person bathing (including washing, rinsing, drying)?: A Little Help from another person to put on and taking off regular upper body clothing?: None Help from another person to put on and taking off regular lower body clothing?: A Little 6 Click Score: 20   End of Session Equipment Utilized During Treatment: Rolling walker (2 wheels) Nurse Communication: Mobility status  Activity Tolerance: Patient tolerated treatment well Patient left: in bed;with call bell/phone within reach;with family/visitor present;with nursing/sitter in room (sitting EOB)  OT Visit Diagnosis: Unsteadiness on feet (R26.81);Muscle weakness (generalized) (M62.81);Other (comment) (decreased activity tolerance)                Time: 1525-1620 OT Time Calculation (min): 55 min Charges:  OT General Charges $OT Visit: 1 Visit OT Evaluation $OT Eval Low Complexity: 1 Low OT Treatments $Self Care/Home Management : 23-37 mins $Therapeutic Exercise: 8-22 mins  Kamera Dubas "Orson Eva., OTR/L, MA Acute Rehab [phone]   Lendon Colonel 08/17/2023, 4:45 PM

## 2023-08-17 NOTE — Progress Notes (Signed)
Physical Therapy Treatment Patient Details Name: Rachel [name] MRN: [id number] DOB: 12-25-1940 Today's Date: 08/17/2023   History of Present Illness Pt is a 82 y/o female admitted 08/13/23 with dizziness, fatigue and presyncope.  Found to be in A-fib w/ RVR and hyperkalemia.  She underwent TEE and cardioversion on 08/16/23.  PMH - HTN, CAD, DM, PAF, OA, CHF, CKD III, asthma.    PT Comments  Pt greeted seated up EOB on arrival and eager for mobility and demonstrating good progress towards acute goals. Pt requiring grossly CGA during gait with RW for support with light cues for posture and pacing. Pt able to ascend/descend 6 steps in stairwell with CGA for safety. Pt was educated on continued walker use to maximize functional independence, safety, and decrease risk for falls.  HR stable in SR throughout activity. Educated pt on appropriate activity progression and energy conservation techniques with pt verbalizing understanding. Pt continues to benefit from skilled PT services to progress toward functional mobility goals.     If plan is discharge home, recommend the following: Help with stairs or ramp for entrance;Assist for transportation;Assistance with cooking/housework   Can travel by private vehicle        Equipment Recommendations  None recommended by PT    Recommendations for Other Services       Precautions / Restrictions Precautions Precautions: Fall Restrictions Weight Bearing Restrictions: No     Mobility  Bed Mobility Overal bed mobility: Needs Assistance             General bed mobility comments: pt seated up EOB on arrival    Transfers Overall transfer level: Needs assistance Equipment used: None Transfers: Sit to/from Stand Sit to Stand: Contact guard assist           General transfer comment: CGA for light balance assist    Ambulation/Gait Ambulation/Gait assistance: Contact guard assist, Supervision Gait Distance (Feet): 130 Feet  (x2) Assistive device: Rolling walker (2 wheels) Gait Pattern/deviations: Step-through pattern, Decreased stride length Gait velocity: decr     General Gait Details: steady gait with RW support, slowed gait speed, cues for close RW proximity and upright posture   Stairs Stairs: Yes Stairs assistance: Contact guard assist Stair Management: Two rails, Step to pattern, Forwards Number of Stairs: 6 General stair comments: up/down without LOB, cues for optimal hand placement and step-to pattern for safety as pt catching toes on initial step-over step   Wheelchair Mobility     Tilt Bed    Modified Rankin (Stroke Patients Only)       Balance Overall balance assessment: Needs assistance   Sitting balance-Leahy Scale: Good     Standing balance support: No upper extremity supported, During functional activity Standing balance-Leahy Scale: Fair                              Cognition Arousal: Alert Behavior During Therapy: WFL for tasks assessed/performed Overall Cognitive Status: Within Functional Limits for tasks assessed                                          Exercises      General Comments General comments (skin integrity, edema, etc.): VSS throughout, noted HR maintained in SR in 60s-70s with activity      Pertinent Vitals/Pain Pain Assessment Pain Assessment: No/denies pain    Home  Living                          Prior Function            PT Goals (current goals can now be found in the care plan section) Acute Rehab PT Goals Patient Stated Goal: return to independent PT Goal Formulation: With patient/family Time For Goal Achievement: 08/29/23 Progress towards PT goals: Progressing toward goals    Frequency    Min 1X/week      PT Plan      Co-evaluation              AM-PAC PT "6 Clicks" Mobility   Outcome Measure  Help needed turning from your back to your side while in a flat bed without using  bedrails?: A Little Help needed moving from lying on your back to sitting on the side of a flat bed without using bedrails?: A Little Help needed moving to and from a bed to a chair (including a wheelchair)?: A Little Help needed standing up from a chair using your arms (e.g., wheelchair or bedside chair)?: A Little Help needed to walk in hospital room?: A Little Help needed climbing 3-5 steps with a railing? : A Little 6 Click Score: 18    End of Session Equipment Utilized During Treatment: Gait belt Activity Tolerance: Patient tolerated treatment well Patient left: in bed;with call bell/phone within reach;with family/visitor present Nurse Communication: Mobility status PT Visit Diagnosis: Muscle weakness (generalized) (M62.81)     Time: 8119-1478 PT Time Calculation (min) (ACUTE ONLY): 20 min  Charges:    $Gait Training: 8-22 mins PT General Charges $$ ACUTE PT VISIT: 1 Visit                     Tobi Bastos R. PTA Acute Rehabilitation Services Office: [phone]   Catalina Antigua 08/17/2023, 2:06 PM

## 2023-08-17 NOTE — Assessment & Plan Note (Addendum)
Slumped over while sitting on edge of bed, denies LOC.  Denies hitting her head.  Suspect this is related to episode of A-fib with RVR.  No other symptoms of this since admission.  PT without PT follow-up recommendations at this time. - Continue to monitor symptoms - PT/OT to follow in acute setting

## 2023-08-17 NOTE — Progress Notes (Signed)
Daily Progress Note Intern Pager: [phone]  Patient name: Rachel [name] Medical record number: [id number] Date of birth: 03-08-1941 Age: 82 y.o. Gender: female  Primary Care Provider: Rema Fendt, NP Consultants: Cardiology Code Status: Full   Pt Overview and Major Events to Date:  11/19 - admitted    Assessment and Plan:   82yo with PMHx paroxysmal afib, HTN, HLD/CAD who presented with near syncope in the setting of afib with RVR.  S/p TEE guided cardioversion with cardiology on 11/22.  Patient remains stable in sinus rhythm since cardioversion.  Assessment & Plan Atrial fibrillation with RVR (HCC) Patient with history of paroxysmal A-fib. Follows with Dr. Duke Salvia for cardiology. HR has stabilized into 70s on diltiazem drip. Echo obtained here unremarkable. TEE on 11/22: successful cardioversion to sinus rhythm, no LA/LAA thrombus identified.  Sinus rhythm seen on telemetry overnight 11/22-11/23.  Few PACs, otherwise unremarkable EKG morning of 11/23. - Appreciate ongoing Cardiology recs  - Continue home Eliquis 5 mg twice daily - Continue diltiazem 180mg  daily per cardiology - Hold home metoprolol for now - Cardiac telemetry - AM BMP DM2 (diabetes mellitus, type 2) (HCC) Januvia 25 mg daily on medication list, however it appears this was a new medication the patient has not picked up yet.  No other diabetic medications and she has never used insulin. CBGs have been in 200s throughout admission. Notes she was on metformin a few months ago but could not tolerate GI side effects. Received 7 u SA yesterday and 12 LA. Morning CBG 124. - Sensitive sliding scale - Cont semglee 12U - CBGs before meals and at bedtime - Consider adding mealtime insulin Near syncope Slumped over while sitting on edge of bed, denies LOC.  Denies hitting her head.  Suspect this is related to episode of A-fib with RVR.  No other symptoms of this since admission.  PT without PT follow-up  recommendations at this time. - Continue to monitor symptoms - PT/OT to follow in acute setting Acute kidney injury (HCC) Creatinine elevated to 1.43 during admission, improved back to around her baseline of 1.3. Unclear etiology of AKI noted on 11/21. She did have a dose of Lasix when she was admitted, this could have contributed. No nephrotoxic medications currently. U/A with leukocytes, overall symptomatic. Bladder scan unremarkable. - AM BMP  Chronic and Stable Issues: Hypertension - holding home valsartan-HCTZ and spironolactone due to hyperkalemia HLD/CAD - continue rosuvastatin 5 mg daily    FEN/GI: heart healthy  PPx: Eliquis 5mg  BID  Dispo: Home pending clinical improvement   Subjective:  Patient feeling well this morning.  No chest pain, difficulty breathing, or headache.  No palpitations.  She feels she has more energy.  She has been able to walk in the hallway and around her room, noting improved breathing during ambulation.  She has been tolerating p.o. without issue.  Objective: Temp:  [97.4 F (36.3 C)-98.1 F (36.7 C)] 98 F (36.7 C) (11/23 0310) Pulse Rate:  [73-124] 73 (11/22 1936) Resp:  [13-22] 19 (11/23 0310) BP: (121-152)/(50-88) 126/50 (11/23 0310) SpO2:  [97 %-100 %] 98 % (11/23 0310) Physical Exam: General: Well-appearing. Resting comfortably in room. CV: Normal S1/S2. No extra heart sounds. Warm and well-perfused. Pulm: Breathing comfortably on room air. CTAB. No increased WOB. Abd: Soft, non-tender, non-distended. Ext: No lower extremity edema noted.  Nontender, nonerythematous. Skin:  Warm, dry. Psych: Pleasant and appropriate.    Laboratory: Most recent CBC Lab Results  Component Value Date  WBC 9.3 08/14/2023   HGB 11.5 (L) 08/14/2023   HCT 35.1 (L) 08/14/2023   MCV 85.2 08/14/2023   PLT 301 08/14/2023   Most recent BMP    Latest Ref Rng & Units 08/16/2023    4:03 AM  BMP  Glucose 70 - 99 mg/dL 782   BUN 8 - 23 mg/dL 40   Creatinine  9.56 - 1.00 mg/dL 2.13   Sodium 086 - 578 mmol/L 136   Potassium 3.5 - 5.1 mmol/L 4.5   Chloride 98 - 111 mmol/L 103   CO2 22 - 32 mmol/L 23   Calcium 8.9 - 10.3 mg/dL 9.2    EKG 46/96: Some PACs, otherwise normal  Ivery Quale, MD 08/17/2023, 7:32 AM  PGY-1, [facility] FPTS Intern pager: [phone], text pages welcome Secure chat group [facility] Teaching Service

## 2023-08-19 ENCOUNTER — Encounter: Payer: Self-pay | Admitting: Family

## 2023-08-19 ENCOUNTER — Ambulatory Visit (INDEPENDENT_AMBULATORY_CARE_PROVIDER_SITE_OTHER): Payer: PPO | Admitting: Family

## 2023-08-19 ENCOUNTER — Telehealth: Payer: Self-pay

## 2023-08-19 VITALS — BP 139/79 | HR 63 | Temp 97.6°F | Ht 63.0 in | Wt 202.2 lb

## 2023-08-19 DIAGNOSIS — R55 Syncope and collapse: Secondary | ICD-10-CM | POA: Diagnosis not present

## 2023-08-19 DIAGNOSIS — I4891 Unspecified atrial fibrillation: Secondary | ICD-10-CM | POA: Diagnosis not present

## 2023-08-19 DIAGNOSIS — Z7984 Long term (current) use of oral hypoglycemic drugs: Secondary | ICD-10-CM | POA: Diagnosis not present

## 2023-08-19 DIAGNOSIS — Z09 Encounter for follow-up examination after completed treatment for conditions other than malignant neoplasm: Secondary | ICD-10-CM

## 2023-08-19 DIAGNOSIS — R238 Other skin changes: Secondary | ICD-10-CM | POA: Diagnosis not present

## 2023-08-19 DIAGNOSIS — I10 Essential (primary) hypertension: Secondary | ICD-10-CM | POA: Diagnosis not present

## 2023-08-19 DIAGNOSIS — N1832 Chronic kidney disease, stage 3b: Secondary | ICD-10-CM

## 2023-08-19 DIAGNOSIS — I483 Typical atrial flutter: Secondary | ICD-10-CM

## 2023-08-19 DIAGNOSIS — N179 Acute kidney failure, unspecified: Secondary | ICD-10-CM | POA: Diagnosis not present

## 2023-08-19 DIAGNOSIS — R059 Cough, unspecified: Secondary | ICD-10-CM

## 2023-08-19 DIAGNOSIS — E1165 Type 2 diabetes mellitus with hyperglycemia: Secondary | ICD-10-CM | POA: Diagnosis not present

## 2023-08-19 MED ORDER — FREESTYLE LIBRE 3 PLUS SENSOR MISC: 1.0000 | Freq: Three times a day (TID) | 2 refills | Status: DC

## 2023-08-19 MED ORDER — FREESTYLE LIBRE 3 READER DEVI: 1.0000 | Freq: Three times a day (TID) | 2 refills | Status: DC

## 2023-08-19 MED ORDER — BENZONATATE 100 MG PO CAPS
100.0000 mg | ORAL_CAPSULE | Freq: Three times a day (TID) | ORAL | 1 refills | Status: DC | PRN
Start: 2023-08-19 — End: 2023-10-23

## 2023-08-19 MED ORDER — SITAGLIPTIN PHOSPHATE 25 MG PO TABS: 25.0000 mg | ORAL_TABLET | Freq: Every day | ORAL | 1 refills | Status: DC

## 2023-08-19 MED ORDER — MUPIROCIN CALCIUM 2 % EX CREA: 1.0000 | TOPICAL_CREAM | Freq: Two times a day (BID) | CUTANEOUS | 0 refills | Status: DC

## 2023-08-19 NOTE — Progress Notes (Signed)
Patient states she just got out the hospital she states it was a-fib that sent her into hospital.   Patient states they told her tostop all of her blood pressure medication until she saw you. She wants you to go over the medications with her.   Patient has multiple complaints to discuss.

## 2023-08-19 NOTE — Patient Instructions (Addendum)
Please call [facility] to schedule appointment for diabetes. Phone #: [phone]. Please call [facility]. Phone # [phone].
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
## 2023-08-19 NOTE — Transitions of Care (Post Inpatient/ED Visit) (Signed)
08/19/2023  Name: Rachel [name] MRN: [id number] DOB: 03-12-1941  Today's TOC FU Call Status: Today's TOC FU Call Status:: Successful TOC FU Call Completed TOC FU Call Complete Date: 08/19/23 Patient's Name and Date of Birth confirmed.  Transition Care Management Follow-up Telephone Call Date of Discharge: 08/17/23 Discharge Facility: [facility]) Type of Discharge: Inpatient Admission Primary Inpatient Discharge Diagnosis:: AtrialFibrillation with RVR (Rapid Ventricular Rate), hyperkalemia, underwent Transesphageal Echocardiogram with Cardioversion on 08/16/23 How have you been since you were released from the hospital?: Better Any questions or concerns?: Yes Patient Questions/Concerns:: concerned over price of Eliquis which is the anti-coagulant medicine she was prescribed to take at discharge Patient Questions/Concerns Addressed: Other: (Suggested talking to Cardiology team at [facility] regarding possibly obtaining Eliquis at a lower monthly cost. Has a Hospital follow up appointment with Lake Bells at the [facility] on 11/27 @2pm )  Items Reviewed: Did you receive and understand the discharge instructions provided?: Yes Medications obtained,verified, and reconciled?: Yes (Medications Reviewed) Any new allergies since your discharge?: No Dietary orders reviewed?: Yes Type of Diet Ordered:: Low sodium, heart healthy, carbohydrate modified Do you have support at home?: Yes People in Home: child(ren), adult Name of Support/Comfort Primary Source: daughter, Chandra Batch who can help with home care as needed  Medications Reviewed Today: Medications Reviewed Today     Reviewed by Marcos Eke, RN (Registered Nurse) on 08/19/23 at 1109  Med List Status: <None>   Medication Order Taking? Sig Documenting Provider Last Dose Status Informant  acetaminophen (TYLENOL) 500 MG tablet [id number] No Take 500 mg by mouth every 8 (eight) hours as needed  for mild pain or headache. [provider] Taking Active Self, Pharmacy Records  albuterol (VENTOLIN HFA) 108 (90 Base) MCG/ACT inhaler [id number] No INHALE 1-2 PUFFS BY MOUTH EVERY 6 HOURS AS NEEDED FOR WHEEZE OR SHORTNESS OF BREATH  Patient taking differently: Inhale 1-2 puffs into the lungs every 6 (six) hours as needed for wheezing or shortness of breath.   Rema Fendt, NP Taking Active Self, Pharmacy Records  apixaban (ELIQUIS) 5 MG TABS tablet [id number] No Take 1 tablet (5 mg total) by mouth 2 (two) times daily. Alver Sorrow, NP Taking Active Self, Pharmacy Records  benzonatate (TESSALON PERLES) 100 MG capsule [id number]  Take 1 capsule (100 mg total) by mouth 3 (three) times daily as needed for cough. Rema Fendt, NP  Active   budesonide (PULMICORT) 0.25 MG/2ML nebulizer solution [id number] No Take 2 mLs (0.25 mg total) by nebulization 2 (two) times daily. Rema Fendt, NP Taking Active Self, Pharmacy Records           Med Note [facility], [place]   Tue Jul 02, 2023  1:31 PM) As needed  Continuous Glucose Receiver (FREESTYLE LIBRE 3 READER) DEVI [id number]  1 each by Does not apply route 4 (four) times daily -  before meals and at bedtime. Rema Fendt, NP  Active   Continuous Glucose Sensor (FREESTYLE LIBRE 3 PLUS SENSOR) MISC [id number]  1 each by Other route 4 (four) times daily -  before meals and at bedtime. Change sensor every 15 days. Rema Fendt, NP  Active   diclofenac Sodium (VOLTAREN) 1 % GEL [id number] No Apply 2 g topically 4 (four) times daily as needed. Chilton Si, MD Taking Active Self, Pharmacy Records  diltiazem [facility] CD) 180 MG 24 hr capsule [id number] No Take 1 capsule (180 mg total) by mouth daily. Erick Alley, DO Taking  Active   fluticasone (FLONASE) 50 MCG/ACT nasal spray [id number] No Place 2 sprays into both nostrils daily as needed for allergies or rhinitis. [provider] Taking Active Self, Pharmacy Records  glucose blood  (ONETOUCH VERIO) test strip [id number] No Use to check blood sugars once a day Georganna Skeans, MD Taking Active Self, Pharmacy Records  ipratropium (ATROVENT HFA) 17 MCG/ACT inhaler [id number] No Inhale 1 puff into the lungs 3 (three) times daily. Emeline General, MD Taking Active Self, Pharmacy Records  loratadine (CLARITIN) 10 MG tablet [id number] No TAKE 1 TABLET BY MOUTH EVERY DAY (DRUG NOT COVERED BY INS)  Patient taking differently: Take 10 mg by mouth daily.   Rema Fendt, NP Taking Active Self, Pharmacy Records  Multiple Vitamins-Minerals (CENTRUM SILVER 50+WOMEN) TABS [id number] No Take 1 tablet by mouth daily. [provider] Taking Active Self, Pharmacy Records  mupirocin cream (BACTROBAN) 2 % [id number]  Apply 1 Application topically 2 (two) times daily. Rema Fendt, NP  Active   nystatin (MYCOSTATIN/NYSTOP) powder [id number] No Apply topically 4 (four) times daily. Pincus Sanes, MD Taking Active Self, Pharmacy Records           Med Note [facility], [place] [facility]   Tue Jul 02, 2023  1:35 PM) As needed  Dola Argyle Lancets 30G MISC [id number] No E11.9 Georganna Skeans, MD Taking Active Self, Pharmacy Records  rosuvastatin (CRESTOR) 5 MG tablet [id number] No Take 1 tablet (5 mg total) by mouth daily. Alver Sorrow, NP 08/12/2023 Expired 08/13/23 2359 Self, Pharmacy Records  sitaGLIPtin (JANUVIA) 25 MG tablet [id number]  Take 1 tablet (25 mg total) by mouth daily. Rema Fendt, NP  Active             Home Care and Equipment/Supplies: Were Home Health Services Ordered?: No Any new equipment or medical supplies ordered?: No  Functional Questionnaire: Do you need assistance with bathing/showering or dressing?: No Do you need assistance with meal preparation?: No Do you need assistance with eating?: No Do you have difficulty maintaining continence: No Do you need assistance with getting out of bed/getting out of a chair/moving?: No Do you have difficulty managing or taking  your medications?: No  Follow up appointments reviewed: PCP Follow-up appointment confirmed?: Yes Date of PCP follow-up appointment?: 08/19/23 Follow-up Provider: Ricky Stabs, Nurse Practitioner Specialist Hospital Follow-up appointment confirmed?: Yes Date of Specialist follow-up appointment?: 08/21/23 Follow-Up Specialty Provider:: Lake Bells, hospital follow up at [facility] on 11/27 @ 2pm - to discuss cost of Eliquis during follow up Do you need transportation to your follow-up appointment?: No Do you understand care options if your condition(s) worsen?: Yes-patient verbalized understanding    Alyse Low, RN, BA, [facility], CRRN [facility] [place] [facility] Care Management Coordinator, Transition of Care Ph # [phone]

## 2023-08-20 ENCOUNTER — Ambulatory Visit: Payer: PPO | Admitting: Family

## 2023-08-21 ENCOUNTER — Ambulatory Visit (HOSPITAL_COMMUNITY)
Admit: 2023-08-21 | Discharge: 2023-08-21 | Disposition: A | Discharge status: Home / Self Care | Payer: PPO | Attending: Internal Medicine | Admitting: Internal Medicine

## 2023-08-21 ENCOUNTER — Encounter (HOSPITAL_COMMUNITY): Payer: Self-pay | Admitting: Internal Medicine

## 2023-08-21 VITALS — BP 132/62 | HR 60 | Ht 63.0 in | Wt 203.6 lb

## 2023-08-21 DIAGNOSIS — Z7901 Long term (current) use of anticoagulants: Secondary | ICD-10-CM | POA: Insufficient documentation

## 2023-08-21 DIAGNOSIS — Z79899 Other long term (current) drug therapy: Secondary | ICD-10-CM | POA: Diagnosis not present

## 2023-08-21 DIAGNOSIS — I110 Hypertensive heart disease with heart failure: Secondary | ICD-10-CM | POA: Insufficient documentation

## 2023-08-21 DIAGNOSIS — D6869 Other thrombophilia: Secondary | ICD-10-CM | POA: Insufficient documentation

## 2023-08-21 DIAGNOSIS — I483 Typical atrial flutter: Secondary | ICD-10-CM | POA: Diagnosis not present

## 2023-08-21 DIAGNOSIS — E785 Hyperlipidemia, unspecified: Secondary | ICD-10-CM | POA: Diagnosis not present

## 2023-08-21 DIAGNOSIS — I480 Paroxysmal atrial fibrillation: Secondary | ICD-10-CM | POA: Diagnosis not present

## 2023-08-21 DIAGNOSIS — I2510 Atherosclerotic heart disease of native coronary artery without angina pectoris: Secondary | ICD-10-CM | POA: Insufficient documentation

## 2023-08-21 DIAGNOSIS — E119 Type 2 diabetes mellitus without complications: Secondary | ICD-10-CM | POA: Diagnosis not present

## 2023-08-21 DIAGNOSIS — I5032 Chronic diastolic (congestive) heart failure: Secondary | ICD-10-CM | POA: Diagnosis not present

## 2023-08-21 MED ORDER — FUROSEMIDE 20 MG PO TABS: 20.0000 mg | ORAL_TABLET | Freq: Every day | ORAL | 0 refills | Status: DC | PRN

## 2023-08-21 MED ORDER — DILTIAZEM HCL ER COATED BEADS 180 MG PO CP24: 180.0000 mg | ORAL_CAPSULE | Freq: Every day | ORAL | 3 refills | Status: DC

## 2023-08-21 NOTE — Progress Notes (Unsigned)
Office Visit Note  Patient: Rachel [name]             Date of Birth: 1941-08-08           MRN: [id number]             PCP: Rema Fendt, NP Referring: Rema Fendt, NP Visit Date: 09/04/2023 Occupation: @GUAROCC @  Subjective:  Discuss DEXA results   History of Present Illness: Rachel [name] is a 82 y.o. female who presents today for a new patient consultation.  Patient was previous diagnosed with osteopenia.       Activities of Daily Living:  Patient reports morning stiffness for *** {minute/hour:19697}.   Patient {ACTIONS;DENIES/REPORTS:21021675::"Denies"} nocturnal pain.  Difficulty dressing/grooming: {ACTIONS;DENIES/REPORTS:21021675::"Denies"} Difficulty climbing stairs: {ACTIONS;DENIES/REPORTS:21021675::"Denies"} Difficulty getting out of chair: {ACTIONS;DENIES/REPORTS:21021675::"Denies"} Difficulty using hands for taps, buttons, cutlery, and/or writing: {ACTIONS;DENIES/REPORTS:21021675::"Denies"}  No Rheumatology ROS completed.   PMFS History:  Patient Active Problem List   Diagnosis Date Noted   Typical atrial flutter (HCC) 08/16/2023   Acute kidney injury (HCC) 08/15/2023   Atrial fibrillation with RVR (HCC) 08/13/2023   CAD in native artery 07/17/2023   PAF (paroxysmal atrial fibrillation) (HCC) 02/24/2023   Near syncope 02/24/2023   Afib (HCC) 02/24/2023   (HFpEF) heart failure with preserved ejection fraction (HCC) 01/17/2023   Left shoulder pain 11/01/2021   Shortness of breath 11/01/2021   Candidiasis of skin 03/17/2019   Asthmatic bronchitis 08/06/2018   Mild cardiomegaly 01/03/2018   Well adult exam 12/14/2015   Acute UTI 12/14/2015   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   Bacterial pharyngitis 08/25/2014   Acute bronchitis 07/03/2014   Oral thrush 07/03/2014   Onychomycosis 02/18/2013   URI (upper respiratory infection) 07/11/2012   Cough 07/11/2012   Ganglion cyst 06/25/2011   Foot pain, left 06/25/2011   Otitis media  06/25/2011   Lipoma 11/03/2010   EUSTACHIAN TUBE DYSFUNCTION, RIGHT 02/18/2010   Asthma with acute exacerbation 11/02/2009   ONYCHOMYCOSIS, TOENAILS 05/18/2009   Hyperlipidemia 09/15/2008   DM2 (diabetes mellitus, type 2) (HCC) 04/19/2007   Essential hypertension 04/19/2007   Allergic rhinitis 04/19/2007   OSTEOARTHRITIS 04/19/2007    Past Medical History:  Diagnosis Date   (HFpEF) heart failure with preserved ejection fraction (HCC) 01/17/2023   Allergic rhinitis    CAD in native artery 07/17/2023   Diabetes mellitus, type 2 (HCC)    Hyperkalemia 08/14/2023   Hyperlipemia    Hypertension    Left shoulder pain 11/01/2021   Lipoma NEC    anterior upper chest   Osteoarthritis of knee    PAF (paroxysmal atrial fibrillation) (HCC) 02/24/2023   Shortness of breath 11/01/2021    Family History  Problem Relation Age of Onset   Diabetes Mother    Heart attack Mother    Cancer Father        brain   Diabetes Sister    Hypertension Sister    Diabetes Brother    Hypertension Brother    Coronary artery disease Brother    Aneurysm Other        niece   Past Surgical History:  Procedure Laterality Date   ACNE CYST REMOVAL  1970   back   APPENDECTOMY  1963   CARDIOVERSION N/A 08/16/2023   Procedure: CARDIOVERSION (CATH LAB);  Surgeon: Sande Rives, MD;  Location: [facility] INVASIVE CV LAB;  Service: Cardiovascular;  Laterality: N/A;   LEFT HEART CATH AND CORONARY ANGIOGRAPHY N/A 02/25/2023   Procedure: LEFT HEART CATH AND CORONARY ANGIOGRAPHY;  Surgeon:  Kathleene Hazel, MD;  Location: [facility] INVASIVE CV LAB;  Service: Cardiovascular;  Laterality: N/A;   OOPHORECTOMY  1985   TRANSESOPHAGEAL ECHOCARDIOGRAM (CATH LAB) N/A 08/16/2023   Procedure: TRANSESOPHAGEAL ECHOCARDIOGRAM;  Surgeon: Sande Rives, MD;  Location: [facility] INVASIVE CV LAB;  Service: Cardiovascular;  Laterality: N/A;   VAGINAL HYSTERECTOMY  1985   complete   Social History   Social History Narrative   No  regular exercise   Widowed   Immunization History  Administered Date(s) Administered   PFIZER Comirnaty(Gray Top)Covid-19 Tri-Sucrose Vaccine 12/16/2020, 06/14/2022   PFIZER(Purple Top)SARS-COV-2 Vaccination 11/22/2019, 03/14/2020   PPD Test 03/14/2011   Pfizer Covid-19 Vaccine Bivalent Booster 7yrs & up 05/31/2021   Pneumococcal Conjugate-13 04/16/2017   Pneumococcal Polysaccharide-23 09/15/2008   Td 09/15/2008   Tdap 09/10/2022     Objective: Vital Signs: There were no vitals taken for this visit.   Physical Exam Vitals and nursing note reviewed.  Constitutional:      Appearance: She is well-developed.  HENT:     Head: Normocephalic and atraumatic.  Eyes:     Conjunctiva/sclera: Conjunctivae normal.  Cardiovascular:     Rate and Rhythm: Normal rate and regular rhythm.     Heart sounds: Normal heart sounds.  Pulmonary:     Effort: Pulmonary effort is normal.     Breath sounds: Normal breath sounds.  Abdominal:     General: Bowel sounds are normal.     Palpations: Abdomen is soft.  Musculoskeletal:     Cervical back: Normal range of motion.  Lymphadenopathy:     Cervical: No cervical adenopathy.  Skin:    General: Skin is warm and dry.     Capillary Refill: Capillary refill takes less than 2 seconds.  Neurological:     Mental Status: She is alert and oriented to person, place, and time.  Psychiatric:        Behavior: Behavior normal.     Musculoskeletal Exam: ***  CDAI Exam: CDAI Score: -- Patient Global: --; Provider Global: -- Swollen: --; Tender: -- Joint Exam 09/04/2023   No joint exam has been documented for this visit   There is currently no information documented on the homunculus. Go to the Rheumatology activity and complete the homunculus joint exam.  Investigation: No additional findings.  Imaging: ECHO TEE  Result Date: 08/16/2023    TRANSESOPHOGEAL ECHO REPORT   Patient Name:   Rachel [name] [facility] Date of Exam: 08/16/2023 Medical  Rec #:  [id number]                  Height:       63.0 in Accession #:    [id number]                 Weight:       202.8 lb Date of Birth:  [birth year]                 BSA:          1.945 m Patient Age:    81 years                   BP:           154/116 mmHg Patient Gender: F                          HR:           110 bpm. Exam Location:  Inpatient Procedure:  Transesophageal Echo, Color Doppler and Cardiac Doppler Indications:     Arrhythmia  History:         Patient has prior history of Echocardiogram examinations, most                  recent 08/14/2023. Abnormal ECG.  Sonographer:     Darlys Gales Referring Phys:  [id number] Ronnald Ramp O'NEAL Diagnosing Phys: Lennie Odor MD PROCEDURE: After discussion of the risks and benefits of a TEE, an informed consent was obtained from the patient. TEE procedure time was 4 minutes. The transesophogeal probe was passed without difficulty through the esophogus of the patient. Imaged were  obtained with the patient in a left lateral decubitus position. Sedation performed by different physician. The patient was monitored while under deep sedation. Anesthestetic sedation was provided intravenously by Anesthesiology: 450mg  of Propofol, 100mg   of Lidocaine. Image quality was excellent. The patient's vital signs; including heart rate, blood pressure, and oxygen saturation; remained stable throughout the procedure. The patient developed no complications during the procedure. A successful direct  current cardioversion was performed at 200 joules with 1 attempt.  IMPRESSIONS  1. Left ventricular ejection fraction, by estimation, is 60 to 65%. The left ventricle has normal function.  2. Right ventricular systolic function is normal. The right ventricular size is normal.  3. Left atrial size was mildly dilated. No left atrial/left atrial appendage thrombus was detected. The LAA emptying velocity was 38 cm/s.  4. The mitral valve is grossly normal. Trivial mitral valve regurgitation. No  evidence of mitral stenosis.  5. The aortic valve is tricuspid. Aortic valve regurgitation is not visualized. No aortic stenosis is present. Conclusion(s)/Recommendation(s): No LA/LAA thrombus identified. Successful cardioversion performed with restoration of normal sinus rhythm. FINDINGS  Left Ventricle: Left ventricular ejection fraction, by estimation, is 60 to 65%. The left ventricle has normal function. The left ventricular internal cavity size was normal in size. Right Ventricle: The right ventricular size is normal. No increase in right ventricular wall thickness. Right ventricular systolic function is normal. Left Atrium: Left atrial size was mildly dilated. No left atrial/left atrial appendage thrombus was detected. The LAA emptying velocity was 38 cm/s. Right Atrium: Right atrial size was normal in size. Pericardium: Trivial pericardial effusion is present. Mitral Valve: The mitral valve is grossly normal. Trivial mitral valve regurgitation. No evidence of mitral valve stenosis. Tricuspid Valve: The tricuspid valve is grossly normal. Tricuspid valve regurgitation is mild . No evidence of tricuspid stenosis. Aortic Valve: The aortic valve is tricuspid. Aortic valve regurgitation is not visualized. No aortic stenosis is present. Pulmonic Valve: The pulmonic valve was grossly normal. Pulmonic valve regurgitation is trivial. No evidence of pulmonic stenosis. Aorta: The aortic root and ascending aorta are structurally normal, with no evidence of dilitation. There is minimal (Grade I) layered plaque involving the descending aorta and aortic arch. Venous: The left upper pulmonary vein, left lower pulmonary vein, right lower pulmonary vein and right upper pulmonary vein are normal. IAS/Shunts: The atrial septum is grossly normal.   AORTA Ao Root diam: 2.35 cm Ao Asc diam:  3.06 cm Lennie Odor MD Electronically signed by Lennie Odor MD Signature Date/Time: 08/16/2023/1:43:11 PM    Final    EP STUDY  Result  Date: 08/16/2023 See surgical note for result.  ECHOCARDIOGRAM COMPLETE  Result Date: 08/14/2023    ECHOCARDIOGRAM REPORT   Patient Name:   LASHANDRA REASNER [facility] Date of Exam: 08/14/2023 Medical Rec #:  [id number]  Height:       63.0 in Accession #:    [id number]                 Weight:       202.8 lb Date of Birth:  May 08, 1941                 BSA:          1.945 m Patient Age:    81 years                   BP:           128/80 mmHg Patient Gender: F                          HR:           78 bpm. Exam Location:  Inpatient Procedure: 2D Echo, Cardiac Doppler and Color Doppler Indications:    Atrial Fibrillation I48.91  History:        Patient has prior history of Echocardiogram examinations, most                 recent 02/25/2023. CHF, CAD, Arrythmias:Atrial Fibrillation and                 Atrial Flutter; Risk Factors:Hypertension and Diabetes.  Sonographer:    Darlys Gales Referring Phys: [id number] MARGARET E PRAY IMPRESSIONS  1. Left ventricular ejection fraction, by estimation, is 60 to 65%. The left ventricle has normal function. The left ventricle has no regional wall motion abnormalities. Left ventricular diastolic function could not be evaluated.  2. Right ventricular systolic function is normal. The right ventricular size is normal.  3. The mitral valve is normal in structure. No evidence of mitral valve regurgitation. No evidence of mitral stenosis.  4. The aortic valve is normal in structure. There is mild calcification of the aortic valve. Aortic valve regurgitation is not visualized. No aortic stenosis is present.  5. The inferior vena cava is normal in size with greater than 50% respiratory variability, suggesting right atrial pressure of 3 mmHg. FINDINGS  Left Ventricle: Left ventricular ejection fraction, by estimation, is 60 to 65%. The left ventricle has normal function. The left ventricle has no regional wall motion abnormalities. The left ventricular internal cavity size was  normal in size. There is  no left ventricular hypertrophy. Left ventricular diastolic function could not be evaluated due to atrial fibrillation. Left ventricular diastolic function could not be evaluated. Right Ventricle: The right ventricular size is normal. No increase in right ventricular wall thickness. Right ventricular systolic function is normal. Left Atrium: Left atrial size was normal in size. Right Atrium: Right atrial size was normal in size. Pericardium: There is no evidence of pericardial effusion. Mitral Valve: The mitral valve is normal in structure. No evidence of mitral valve regurgitation. No evidence of mitral valve stenosis. Tricuspid Valve: The tricuspid valve is normal in structure. Tricuspid valve regurgitation is trivial. No evidence of tricuspid stenosis. Aortic Valve: The aortic valve is normal in structure. There is mild calcification of the aortic valve. Aortic valve regurgitation is not visualized. No aortic stenosis is present. Aortic valve mean gradient measures 8.0 mmHg. Aortic valve peak gradient measures 11.7 mmHg. Aortic valve area, by VTI measures 2.72 cm. Pulmonic Valve: The pulmonic valve was normal in structure. Pulmonic valve regurgitation is not visualized. No evidence of pulmonic stenosis. Aorta: The aortic root is normal in size and  structure. Venous: The inferior vena cava is normal in size with greater than 50% respiratory variability, suggesting right atrial pressure of 3 mmHg. IAS/Shunts: No atrial level shunt detected by color flow Doppler.  LEFT VENTRICLE PLAX 2D LVIDd:         3.50 cm   Diastology LVIDs:         2.00 cm   LV e' medial:    9.36 cm/s LV PW:         1.00 cm   LV E/e' medial:  12.2 LV IVS:        1.00 cm   LV e' lateral:   8.38 cm/s LVOT diam:     1.80 cm   LV E/e' lateral: 13.6 LV SV:         79 LV SV Index:   41 LVOT Area:     2.54 cm  RIGHT VENTRICLE RV S prime:     11.40 cm/s TAPSE (M-mode): 2.3 cm LEFT ATRIUM             Index        RIGHT ATRIUM            Index LA Vol (A2C):   54.1 ml 27.81 ml/m  RA Area:     14.70 cm LA Vol (A4C):   37.9 ml 19.48 ml/m  RA Volume:   39.70 ml  20.41 ml/m LA Biplane Vol: 46.5 ml 23.91 ml/m  AORTIC VALVE AV Area (Vmax):    2.10 cm AV Area (Vmean):   1.88 cm AV Area (VTI):     2.72 cm AV Vmax:           171.00 cm/s AV Vmean:          135.000 cm/s AV VTI:            0.290 m AV Peak Grad:      11.7 mmHg AV Mean Grad:      8.0 mmHg LVOT Vmax:         141.00 cm/s LVOT Vmean:        100.000 cm/s LVOT VTI:          0.310 m LVOT/AV VTI ratio: 1.07  AORTA Ao Root diam: 2.40 cm MITRAL VALVE MV Area (PHT): 4.15 cm     SHUNTS MV Decel Time: 183 msec     Systemic VTI:  0.31 m MV E velocity: 114.00 cm/s  Systemic Diam: 1.80 cm MV A velocity: 55.50 cm/s MV E/A ratio:  2.05 Aditya Sabharwal Electronically signed by Dorthula Nettles Signature Date/Time: 08/14/2023/1:52:25 PM    Final    DG Chest 2 View  Result Date: 08/13/2023 CLINICAL DATA:  Syncope EXAM: CHEST - 2 VIEW COMPARISON:  Chest x-ray 02/24/2023 FINDINGS: The heart size and mediastinal contours are within normal limits. Both lungs are clear. The visualized skeletal structures are unremarkable. IMPRESSION: No active cardiopulmonary disease. Electronically Signed   By: Darliss Cheney M.D.   On: 08/13/2023 17:02    Recent Labs: Lab Results  Component Value Date   WBC 9.3 08/14/2023   HGB 11.5 (L) 08/14/2023   PLT 301 08/14/2023   NA 136 08/16/2023   K 4.5 08/16/2023   CL 103 08/16/2023   CO2 23 08/16/2023   GLUCOSE 191 (H) 08/16/2023   BUN 40 (H) 08/16/2023   CREATININE 1.43 (H) 08/16/2023   BILITOT 0.4 09/10/2022   ALKPHOS 78 09/10/2022   AST 15 09/10/2022   ALT 18 09/10/2022   PROT 7.3 09/10/2022  ALBUMIN 4.0 09/10/2022   CALCIUM 9.2 08/16/2023   GFRAA  01/04/2011    >60        The eGFR has been calculated using the MDRD equation. This calculation has not been validated in all clinical situations. eGFR's persistently <60 mL/min  signify possible Chronic Kidney Disease.    Speciality Comments: No specialty comments available.  Procedures:  No procedures performed Allergies: Atorvastatin, Glimepiride, and Metformin   Assessment / Plan:     Visit Diagnoses: Osteopenia of multiple sites - DEXA 07/18/23: The BMD measured at DualFemur Neck Right is 0.784 g/cm2 with a T-score of -1.8.  Orders: No orders of the defined types were placed in this encounter.  No orders of the defined types were placed in this encounter.   Face-to-face time spent with patient was *** minutes. Greater than 50% of time was spent in counseling and coordination of care.  Follow-Up Instructions: No follow-ups on file.   Gearldine Bienenstock, PA-C  Note - This record has been created using Dragon software.  Chart creation errors have been sought, but may not always  have been located. Such creation errors do not reflect on  the standard of medical care.

## 2023-08-21 NOTE — Patient Instructions (Signed)
Lasix 20mg  - take 1 tablet daily for 3 days then only as needed for swelling/wt gain   Kardia mobile

## 2023-08-21 NOTE — Progress Notes (Addendum)
Primary Care Physician: Rema Fendt, NP Primary Cardiologist: Chilton Si, MD Electrophysiologist: None     Referring Physician: Dr. Verdell Carmine Ordella Rachel [name] is a 82 y.o. female with a history of HFpEF, CAD, T2DM, HLD, HTN, and paroxysmal atrial fibrillation who presents for consultation in the [facility]. Recent admission for Afib with RVR 11/19-23/24; increased diltiazem to 180 mg daily and underwent successful TEE/DCCV on 08/16/23. Patient is on Eliquis 5 mg BID for a CHADS2VASC score of 6.  On evaluation today, she is currently in NSR. She notices since taking increased diltiazem having bilateral ankle swelling. She has not missed any doses of Eliquis.    Today, she denies symptoms of palpitations, chest pain, shortness of breath, orthopnea, PND, lower extremity edema, dizziness, presyncope, syncope, snoring, daytime somnolence, bleeding, or neurologic sequela. The patient is tolerating medications without difficulties and is otherwise without complaint today.   she has a BMI of Body mass index is 36.07 kg/m.[name] Filed Weights   08/21/23 1359  Weight: 92.4 kg    Current Outpatient Medications  Medication Sig Dispense Refill   acetaminophen (TYLENOL) 500 MG tablet Take 500 mg by mouth every 8 (eight) hours as needed for mild pain or headache.     albuterol (VENTOLIN HFA) 108 (90 Base) MCG/ACT inhaler INHALE 1-2 PUFFS BY MOUTH EVERY 6 HOURS AS NEEDED FOR WHEEZE OR SHORTNESS OF BREATH (Patient taking differently: Inhale 1-2 puffs into the lungs every 6 (six) hours as needed for wheezing or shortness of breath.) 18 each 1   apixaban (ELIQUIS) 5 MG TABS tablet Take 1 tablet (5 mg total) by mouth 2 (two) times daily. 60 tablet 5   benzonatate (TESSALON PERLES) 100 MG capsule Take 1 capsule (100 mg total) by mouth 3 (three) times daily as needed for cough. 30 capsule 1   budesonide (PULMICORT) 0.25 MG/2ML nebulizer solution Take 2 mLs  (0.25 mg total) by nebulization 2 (two) times daily. 60 mL 4   Continuous Glucose Receiver (FREESTYLE LIBRE 3 READER) DEVI 1 each by Does not apply route 4 (four) times daily -  before meals and at bedtime. 1 each 2   Continuous Glucose Sensor (FREESTYLE LIBRE 3 PLUS SENSOR) MISC 1 each by Other route 4 (four) times daily -  before meals and at bedtime. Change sensor every 15 days. 1 each 2   diclofenac Sodium (VOLTAREN) 1 % GEL Apply 2 g topically 4 (four) times daily as needed. 50 g 1   diltiazem (CARDIZEM CD) 180 MG 24 hr capsule Take 1 capsule (180 mg total) by mouth daily. 30 capsule 0   fluticasone (FLONASE) 50 MCG/ACT nasal spray Place 2 sprays into both nostrils daily as needed for allergies or rhinitis.     glucose blood (ONETOUCH VERIO) test strip Use to check blood sugars once a day 100 strip 5   ipratropium (ATROVENT HFA) 17 MCG/ACT inhaler Inhale 1 puff into the lungs 3 (three) times daily. 1 each 2   loratadine (CLARITIN) 10 MG tablet TAKE 1 TABLET BY MOUTH EVERY DAY (DRUG NOT COVERED BY INS) (Patient taking differently: Take 10 mg by mouth daily.) 90 tablet 0   Multiple Vitamins-Minerals (CENTRUM SILVER 50+WOMEN) TABS Take 1 tablet by mouth daily.     mupirocin cream (BACTROBAN) 2 % Apply 1 Application topically 2 (two) times daily. 60 g 0   nystatin (MYCOSTATIN/NYSTOP) powder Apply topically 4 (four) times daily. 60 g 0   OneTouch Delica Lancets  30G MISC E11.9 100 each 3   sitaGLIPtin (JANUVIA) 25 MG tablet Take 1 tablet (25 mg total) by mouth daily. 30 tablet 1   rosuvastatin (CRESTOR) 5 MG tablet Take 1 tablet (5 mg total) by mouth daily. 90 tablet 3   No current facility-administered medications for this encounter.    Atrial Fibrillation Management history:  Previous antiarrhythmic drugs: none Previous cardioversions: 08/16/23 Previous ablations: none Anticoagulation history: Eliquis 5 mg BID   ROS- All systems are reviewed and negative except as per the HPI  above.  Physical Exam: BP 132/62   Pulse 60   Ht 5\' 3"  (1.6 m)   Wt 92.4 kg   BMI 36.07 kg/m   GEN: Well nourished, well developed in no acute distress NECK: No JVD; No carotid bruits CARDIAC: Regular rate and rhythm, no murmurs, rubs, gallops RESPIRATORY:  Clear to auscultation without rales, wheezing or rhonchi  ABDOMEN: Soft, non-tender, non-distended EXTREMITIES:  No edema; No deformity   EKG today demonstrates  Vent. rate 60 BPM PR interval 180 ms QRS duration 94 ms QT/QTcB 432/432 ms P-R-T axes 3 35 45 Normal sinus rhythm Normal ECG When compared with ECG of 17-Aug-2023 09:05, PREVIOUS ECG IS PRESENT  Echo 08/14/23 demonstrated   1. Left ventricular ejection fraction, by estimation, is 60 to 65%. The  left ventricle has normal function. The left ventricle has no regional  wall motion abnormalities. Left ventricular diastolic function could not  be evaluated.   2. Right ventricular systolic function is normal. The right ventricular  size is normal.   3. The mitral valve is normal in structure. No evidence of mitral valve  regurgitation. No evidence of mitral stenosis.   4. The aortic valve is normal in structure. There is mild calcification  of the aortic valve. Aortic valve regurgitation is not visualized. No  aortic stenosis is present.   5. The inferior vena cava is normal in size with greater than 50%  respiratory variability, suggesting right atrial pressure of 3 mmHg.   ASSESSMENT & PLAN CHA2DS2-VASc Score = 6  The patient's score is based upon: CHF History: 1 HTN History: 1 Diabetes History: 1 Stroke History: 0 Vascular Disease History: 0 Age Score: 2 Gender Score: 1       ASSESSMENT AND PLAN: Paroxysmal Atrial Fibrillation (ICD10:  I48.0) / atrial flutter The patient's CHA2DS2-VASc score is 6, indicating a 9.7% annual risk of stroke.    She is currently in NSR. We discussed multiple options for rhythm control including: Tikosyn, Qtc 448, CrCl  46 mL/min with creatinine of 1.43; Multaq, amiodarone, or ablation. We discussed potential adverse effects with each medication and hospital admission for Tikosyn. We discussed ablation as procedural option for rhythm control and speaking with EP about this. We also discussed the option of just continuing with rate control and risk of future Afib episode. Patient will consider all her options, check insurance for pricing, and speak with family about decision. She will call our office with her choice. Rhythm control device recommended.  Due to bilateral ankle swelling, will trial lasix 20 mg x 3 days. If she has recurrence, will contact office.   Secondary Hypercoagulable State (ICD10:  D68.69) The patient is at significant risk for stroke/thromboembolism based upon her CHA2DS2-VASc Score of 6.  Continue Apixaban (Eliquis).  Continue without interruption.   Patient will call with treatment decision.    Lake Bells, PA-C  Afib Clinic [facility] [address] [place], [place] [address] [phone]

## 2023-08-28 ENCOUNTER — Telehealth: Payer: Self-pay

## 2023-08-28 ENCOUNTER — Other Ambulatory Visit: Payer: Self-pay

## 2023-08-28 DIAGNOSIS — I4819 Other persistent atrial fibrillation: Secondary | ICD-10-CM

## 2023-08-28 NOTE — Patient Instructions (Signed)
Visit Information  Hello Rachel [name],  Thank you for taking time to visit with me today. As promised, I have sent a referral request for you to speak to one of our Clinical Pharmacists to discuss possibly being able to obtain Eliquis at a lower monthly cost, as well as a referral request to one of our Social Workers to discuss possible financial resources that you may qualify for, including possibly qualifying for [employer].  Please don't hesitate to contact me if I can be of assistance to you before our next scheduled telephone appointment.  I hope you have a great time at your 82th birthday party planned for this weekend!  Our next appointment is by telephone on 09/04/23 at 1pm  Warm Regards,  Elnita Maxwell  Following is a copy of your care plan:   Goals Addressed             This Visit's Progress    Transition of Care       Current Barriers:  Knowledge Deficits related to plan of care for management of Atrial Fibrillation   RNCM Clinical Goal(s):  Patient will work with the Care Management team over the next 30 days to address Transition of Care Barriers: Medication access Medication Management Diet/Nutrition/Food Resources Support at home Provider appointments Assistance with obtaining needed Eliquis medication at an affordable price  through collaboration with RN Care manager, provider, and care team.   Interventions: Evaluation of current treatment plan related to  self management and patient's adherence to plan as established by provider   AFIB Interventions: (Status:  Ongoing) Short Term Goal   Counseled on increased risk of stroke due to Afib and benefits of anticoagulation for stroke prevention Reviewed importance of adherence to anticoagulant exactly as prescribed Advised patient to discuss obtaining Eliquis at an affordable price on 08/21/23 at the [facility] for hospital follow up with provider 12/4 televisit Update - patient stated that she felt overwhelmed  with all the information she received @ the [facility], including discussion of procedures and other therapies for her Afib and totally forgot to discuss her challenge of being able to afford Eliquis with provider Counseled on bleeding risk associated with taking anticoagulant medicine such as Eliquis and importance of self-monitoring for signs/symptoms of bleeding Counseled on avoidance of NSAIDs due to increased bleeding risk with anticoagulants Counseled on seeking medical attention after a head injury or if there is blood in the urine/stool Assessed social determinant of health barriers 12/4 Update - entered referral for Clinical Pharmacist to discuss medication regimen with patient including probable lifetime need for Eliquis and to help with possibly obtaining Eliquis at a cheaper out-of-pocket cost to patient  12/4 Update - entered referral for BSW involvement to help patient with SDOH challenges  Patient Goals/Self-Care Activities: Take all medications as prescribed Attend all scheduled provider appointments Call pharmacy for medication refills 3-7 days in advance of running out of medications Call provider office for new concerns or questions   Follow Up Plan:  The patient has been provided with contact information for the care management team and has been advised to call with any health related questions or concerns.   Will follow up with patient again, via telephone call, on Wednesday, 09/04/23 at 1 pm        Patient verbalizes understanding of instructions and care plan provided today and agrees to view in MyChart. Active MyChart status and patient understanding of how to access instructions and care plan via MyChart confirmed with patient.  The patient has been provided with contact information for the care management team and has been advised to call with any health related questions or concerns.   Please call the care guide team at [phone] if you need to cancel or  reschedule your appointment.   Please call 1-800-273-TALK (toll free, 24 hour hotline) if you are experiencing a Mental Health or Behavioral Health Crisis or need someone to talk to.  Alyse Low, RN, BA, [facility], CRRN [facility] Coordinator, Transition of Care Ph # [phone]

## 2023-08-28 NOTE — Patient Outreach (Signed)
  Care Management  Transitions of Care Program Transitions of Care Post-discharge week 2  08/28/2023 Name: Sunday Wanlass MRN: [id number] DOB: 1941/05/19  Subjective: Korianne Brar is a 82 y.o. year old female who is a primary care patient of Rema Fendt, NP. The Care Management team was unable to reach the patient by phone to assess and address transitions of care needs.   Plan: Additional outreach attempts will be made to reach the patient enrolled in the [facility] (Post Inpatient/ED Visit).  Alyse Low, RN, BA, [facility], CRRN [facility] Coordinator, Transition of Care Ph # [phone]

## 2023-08-28 NOTE — Patient Outreach (Signed)
Care Management  Transitions of Care Program Transitions of Care Post-discharge week 2   08/28/2023 Name: Rachel [name] MRN: [id number] DOB: [birth year]  Subjective: Rachel [name] is a 82 y.o. year old female who is a primary care patient of Rema Fendt, NP. The Care Management team Engaged with patient Engaged with patient by telephone to assess and address transitions of care needs.   Consent to Services:  Patient was given information about care management services, agreed to services, and gave verbal consent to participate.   Assessment:           SDOH Interventions    Flowsheet Row Telephone from 08/28/2023 in [place] [facility] DEPARTMENT  SDOH Interventions   Food Insecurity Interventions AMB Referral  Financial Strain Interventions Other (Comment)  [referral to be made with BSW to explore resources available to patient to alleviate financial strain]        Goals Addressed             This Visit's Progress    Transition of Care       Current Barriers:  Knowledge Deficits related to plan of care for management of Atrial Fibrillation   RNCM Clinical Goal(s):  Patient will work with the Care Management team over the next 30 days to address Transition of Care Barriers: Medication access Medication Management Diet/Nutrition/Food Resources Support at home Provider appointments Assistance with obtaining needed Eliquis medication at an affordable price  through collaboration with Medical illustrator, provider, and care team.   Interventions: Evaluation of current treatment plan related to  self management and patient's adherence to plan as established by provider   AFIB Interventions: (Status:  Ongoing) Short Term Goal   Counseled on increased risk of stroke due to Afib and benefits of anticoagulation for stroke prevention Reviewed importance of adherence to anticoagulant exactly as prescribed Advised patient to discuss  obtaining Eliquis at an affordable price on 08/21/23 at the [facility] for hospital follow up with provider 12/4 televisit Update - patient stated that she felt overwhelmed with all the information she received @ the [facility] Clinic, including discussion of procedures and other therapies for her Afib and totally forgot to discuss her challenge of being able to afford Eliquis with provider Counseled on bleeding risk associated with taking anticoagulant medicine such as Eliquis and importance of self-monitoring for signs/symptoms of bleeding Counseled on avoidance of NSAIDs due to increased bleeding risk with anticoagulants Counseled on seeking medical attention after a head injury or if there is blood in the urine/stool Assessed social determinant of health barriers 12/4 Update - entered referral for Clinical Pharmacist to discuss medication regimen with patient including probable lifetime need for Eliquis and to help with possibly obtaining Eliquis at a cheaper out-of-pocket cost to patient  12/4 Update - entered referral for BSW involvement to help patient with SDOH challenges  Patient Goals/Self-Care Activities: Take all medications as prescribed Attend all scheduled provider appointments Call pharmacy for medication refills 3-7 days in advance of running out of medications Call provider office for new concerns or questions   Follow Up Plan:  The patient has been provided with contact information for the care management team and has been advised to call with any health related questions or concerns.   Will follow up with patient again, via telephone call, on Wednesday, 09/04/23 at 1 pm        Plan: The patient has been provided with contact information for the care management team and has been  advised to call with any health related questions or concerns.   Alyse Low, RN, BA, [facility], CRRN [facility] Coordinator, Transition of Care Ph # [phone]

## 2023-08-29 ENCOUNTER — Telehealth: Payer: Self-pay | Admitting: Licensed Clinical Social Worker

## 2023-08-29 ENCOUNTER — Telehealth: Payer: Self-pay | Admitting: Pharmacist

## 2023-08-29 NOTE — Telephone Encounter (Signed)
Call placed to patient to discuss applying for Eliquis patient assistance. She was unavailable so I left a HIPAA-compliant VM with instructions to return my call.   Butch Penny, PharmD, [facility] [place], CPP Clinical Pharmacist [facility] [phone]

## 2023-08-29 NOTE — Telephone Encounter (Signed)
H&V Care Navigation CSW Progress Note  Clinical Social Worker contacted patient by phone to f/u on challenges with Eliquis. Would like to complete SDOH wheel and provide resources if needed. Per chart it does not appear pt has completed [employer] assistance yet and will need to provide all supporting documents (3% OOP print outs from pharmacy and LIS denial letter if 150% or less income).   No answer at [phone]. Left voicemail requesting return call.   Mailed pt [employer] application along with the following information so she can complete it appropriately:  You will need to bring in with you the printout/ledger from your pharmacy of your expenses so you can demonstrate that you have spent 3% of your total income on pharmacy. If you have not spent 3% of your income you are not eligible.  If your income is at or below the following amounts ( # of people in household and income) you also will need to apply for the Low Income Subsidy/"Extra Help" program.   Persons in family/household 150% [employer]PL  1 $22,590  2 $30,660  3 $38,730  4 $46,800    Please first call the [employer] Information Program [facility]) for your county- see phone numbers attached and ask them to help you apply for the Low Income Subsidy/"Extra Help" program  If approved this will help lower your monthly prescription drug costs for ALL medications.  If denied for the program, then you need to bring the letter back to the office for [place] to submit for assistance. This letter will not come from [facility] it will come to your home from [employer].    Patient is participating in a Managed Medicaid Plan:  No, [employer] only  SDOH Screenings   Food Insecurity: Food Insecurity Present (08/28/2023)  Housing: Low Risk  (08/28/2023)  Transportation Needs: No Transportation Needs (08/28/2023)  Utilities: Not At Risk (08/28/2023)  Alcohol Screen: Low Risk  (11/01/2021)  Depression (PHQ2-9):  Low Risk  (08/19/2023)  Financial Resource Strain: High Risk (08/28/2023)  Physical Activity: Inactive (08/28/2023)  Tobacco Use: Low Risk  (08/28/2023)  Health Literacy: Adequate Health Literacy (08/28/2023)   Octavio Graves, MSW, LCSW Clinical Social Worker II [facility] Health Heart/Vascular Care Navigation  [phone]- work cell phone (preferred) [phone]- desk phone

## 2023-08-30 ENCOUNTER — Telehealth (HOSPITAL_BASED_OUTPATIENT_CLINIC_OR_DEPARTMENT_OTHER): Payer: Self-pay | Admitting: Licensed Clinical Social Worker

## 2023-08-30 ENCOUNTER — Telehealth: Payer: Self-pay | Admitting: *Deleted

## 2023-08-30 NOTE — Progress Notes (Signed)
  Care Coordination   Note   08/30/2023 Name: [name] MRN: [id number] DOB: 19-Jun-1941  Rachel [name] is a 82 y.o. year old female who sees Rema Fendt, NP for primary care. I reached out to [name] by phone today to offer care coordination services.  [name] was given information about Care Coordination services today including:   The Care Coordination services include support from the care team which includes your Nurse Coordinator, Clinical Social Worker, or Pharmacist.  The Care Coordination team is here to help remove barriers to the health concerns and goals most important to you. Care Coordination services are voluntary, and the patient may decline or stop services at any time by request to their care team member.   Care Coordination Consent Status: Patient agreed to services and verbal consent obtained.   Follow up plan:  Telephone appointment with care coordination team member scheduled for:  12/20  Encounter Outcome:  Patient Scheduled  [facility] Coordination Care Guide  Direct Dial: [phone]

## 2023-08-30 NOTE — Telephone Encounter (Signed)
H&V Care Navigation CSW Progress Note  Clinical Social Worker contacted patient by phone to f/u on challenges with Eliquis. Would like to complete SDOH wheel and provide resources if needed. Per chart it does not appear pt has completed [employer] assistance yet and will need to provide all supporting documents (3% OOP print outs from pharmacy and LIS denial letter if 150% or less income).    No answer at [phone]. Left voicemail requesting return call.    Mailed pt [employer] application on 08/29/23. Will attempt again and reach out to daughter if no return call.   Patient is participating in a Managed Medicaid Plan:  No, [employer] only  SDOH Screenings   Food Insecurity: Food Insecurity Present (08/28/2023)  Housing: Low Risk  (08/28/2023)  Transportation Needs: No Transportation Needs (08/28/2023)  Utilities: Not At Risk (08/28/2023)  Alcohol Screen: Low Risk  (11/01/2021)  Depression (PHQ2-9): Low Risk  (08/19/2023)  Financial Resource Strain: High Risk (08/28/2023)  Physical Activity: Inactive (08/28/2023)  Tobacco Use: Low Risk  (08/28/2023)  Health Literacy: Adequate Health Literacy (08/28/2023)    Octavio Graves, MSW, LCSW Clinical Social Worker II [facility] Health Heart/Vascular Care Navigation  [phone]- work cell phone (preferred) [phone]- desk phone

## 2023-09-02 ENCOUNTER — Telehealth: Payer: Self-pay | Admitting: Licensed Clinical Social Worker

## 2023-09-02 NOTE — Telephone Encounter (Signed)
H&V Care Navigation CSW Progress Note  Clinical Social Worker contacted patient by phone to f/u on challenges with Eliquis. Would like to complete SDOH wheel and provide resources if needed. Per chart it does not appear pt has completed [employer] assistance yet and will need to provide all supporting documents (3% OOP print outs from pharmacy and LIS denial letter if 150% or less income).    No answer at [phone]. Left 3rd voicemail requesting return call.    Mailed pt [employer] application on 08/29/23. Will attempt again and reach out to daughter if no return call. Will also route this to [facility] RNCM who has scheduled call 12/11.    Patient is participating in a Managed Medicaid Plan:  No, [employer] only  SDOH Screenings   Food Insecurity: Food Insecurity Present (08/28/2023)  Housing: Low Risk  (08/28/2023)  Transportation Needs: No Transportation Needs (08/28/2023)  Utilities: Not At Risk (08/28/2023)  Alcohol Screen: Low Risk  (11/01/2021)  Depression (PHQ2-9): Low Risk  (08/19/2023)  Financial Resource Strain: High Risk (08/28/2023)  Physical Activity: Inactive (08/28/2023)  Tobacco Use: Low Risk  (08/28/2023)  Health Literacy: Adequate Health Literacy (08/28/2023)    Octavio Graves, MSW, LCSW Clinical Social Worker II [facility] Health Heart/Vascular Care Navigation  [phone]- work cell phone (preferred) [phone]- desk phone

## 2023-09-04 ENCOUNTER — Encounter: Payer: Self-pay | Admitting: Family

## 2023-09-04 ENCOUNTER — Telehealth (HOSPITAL_BASED_OUTPATIENT_CLINIC_OR_DEPARTMENT_OTHER): Payer: Self-pay | Admitting: Licensed Clinical Social Worker

## 2023-09-04 ENCOUNTER — Telehealth: Payer: Self-pay

## 2023-09-04 ENCOUNTER — Encounter: Payer: PPO | Admitting: Physician Assistant

## 2023-09-04 ENCOUNTER — Other Ambulatory Visit: Payer: Self-pay

## 2023-09-04 DIAGNOSIS — E1140 Type 2 diabetes mellitus with diabetic neuropathy, unspecified: Secondary | ICD-10-CM

## 2023-09-04 DIAGNOSIS — J4531 Mild persistent asthma with (acute) exacerbation: Secondary | ICD-10-CM

## 2023-09-04 DIAGNOSIS — I5033 Acute on chronic diastolic (congestive) heart failure: Secondary | ICD-10-CM

## 2023-09-04 DIAGNOSIS — B351 Tinea unguium: Secondary | ICD-10-CM

## 2023-09-04 DIAGNOSIS — I517 Cardiomegaly: Secondary | ICD-10-CM

## 2023-09-04 DIAGNOSIS — E7800 Pure hypercholesterolemia, unspecified: Secondary | ICD-10-CM

## 2023-09-04 DIAGNOSIS — M8589 Other specified disorders of bone density and structure, multiple sites: Secondary | ICD-10-CM

## 2023-09-04 DIAGNOSIS — I480 Paroxysmal atrial fibrillation: Secondary | ICD-10-CM

## 2023-09-04 DIAGNOSIS — B372 Candidiasis of skin and nail: Secondary | ICD-10-CM

## 2023-09-04 DIAGNOSIS — I2510 Atherosclerotic heart disease of native coronary artery without angina pectoris: Secondary | ICD-10-CM

## 2023-09-04 DIAGNOSIS — I10 Essential (primary) hypertension: Secondary | ICD-10-CM

## 2023-09-04 NOTE — Telephone Encounter (Signed)
H&V Care Navigation CSW Progress Note  Clinical Social Worker  received call from pt  to f/u on my calls to her regarding patient assistance with Eliquis. Pt confirmed home address, PCP, and currently resides alone. Retired this year from her job. Received pension from county and social security income. Pt over 150% of poverty limit and worked during this calendar year- so needs to provide their income statement (tax return) and the printouts from their pharmacy so we can show out of pocket for the year. Hopefully if she can get this in by end of year it will save her from having to spend 3% OOP again in new year before she can receive assistance. Pt will bring these items by Monday. Tomorrow is her 82th birthday and she has folks coming over for a meal to celebrate. Encouraged her to call me as needed.   Patient is participating in a Managed Medicaid Plan:  No, [employer] only  SDOH Screenings   Food Insecurity: Food Insecurity Present (08/28/2023)  Housing: Low Risk  (08/28/2023)  Transportation Needs: No Transportation Needs (08/28/2023)  Utilities: Not At Risk (08/28/2023)  Alcohol Screen: Low Risk  (11/01/2021)  Depression (PHQ2-9): Low Risk  (08/19/2023)  Financial Resource Strain: High Risk (08/28/2023)  Physical Activity: Inactive (08/28/2023)  Tobacco Use: Low Risk  (08/28/2023)  Health Literacy: Adequate Health Literacy (08/28/2023)    Octavio Graves, MSW, LCSW Clinical Social Worker II [facility] Health Heart/Vascular Care Navigation  [phone]- work cell phone (preferred) [phone]- desk phone

## 2023-09-05 ENCOUNTER — Telehealth: Payer: Self-pay

## 2023-09-05 NOTE — Patient Instructions (Signed)
Visit Information  Thank you for taking time to visit with me on 12/11. Please don't hesitate to contact me if I can be of assistance to you before our next scheduled telephone appointment.  Happy 82th birthday, today, [birth year]!!!!!!!!  I hope you have a wonderful day celebrating your birth and take time to reflect on your amazing legacy within your family.  Our next appointment is by telephone on 09/12/23 at 1pm.  Warm Regards,  Elnita Maxwell  Following is a copy of your care plan:   Goals Addressed             This Visit's Progress    Transition of Care       Current Barriers:  Knowledge Deficits related to plan of care for management of Atrial Fibrillation   RNCM Clinical Goal(s):  Patient will work with the Care Management team over the next 30 days to address Transition of Care Barriers: Medication access Medication Management Diet/Nutrition/Food Resources Support at home Provider appointments Assistance with obtaining needed Eliquis medication at an affordable price  through collaboration with RN Care manager, provider, and care team.   Interventions: Evaluation of current treatment plan related to  self management and patient's adherence to plan as established by provider   AFIB Interventions: (Status:  Ongoing) Short Term Goal   Counseled on increased risk of stroke due to Afib and benefits of anticoagulation for stroke prevention Reviewed importance of adherence to anticoagulant exactly as prescribed Advised patient to discuss obtaining Eliquis at an affordable price on 08/21/23 at the [facility] Clinic for hospital follow up with provider 12/4 televisit Update - patient stated that she felt overwhelmed with all the information she received @ the Afib Clinic, including discussion of procedures and other therapies for her Afib and totally forgot to discuss her challenge of being able to afford Eliquis with provider Counseled on bleeding risk associated with taking anticoagulant  medicine such as Eliquis and importance of self-monitoring for signs/symptoms of bleeding Counseled on avoidance of NSAIDs due to increased bleeding risk with anticoagulants Counseled on seeking medical attention after a head injury or if there is blood in the urine/stool Assessed social determinant of health barriers 12/4 Update - entered referral for Clinical Pharmacist to discuss medication regimen with patient including probable lifetime need for Eliquis and to help with possibly obtaining Eliquis at a cheaper out-of-pocket cost to patient  12/4 Update - entered referral for BSW involvement to help patient with SDOH challenges 12/11 Update - follow up by Octavio Graves, LCSW (Cardiology) to provide paperwork for assistance in paying for Eliquis co-pay  Patient Goals/Self-Care Activities: Take all medications as prescribed Attend all scheduled provider appointments Call pharmacy for medication refills 3-7 days in advance of running out of medications Call provider office for new concerns or questions   Follow Up Plan:  The patient has been provided with contact information for the care management team and has been advised to call with any health related questions or concerns.   Will follow up with patient again, via telephone call, on Thursday, 09/12/23 at 1 pm        Patient verbalizes understanding of instructions and care plan provided today and agrees to view in MyChart. Active MyChart status and patient understanding of how to access instructions and care plan via MyChart confirmed with patient.     The patient has been provided with contact information for the care management team and has been advised to call with any health related questions or concerns.  Please call the care guide team at [phone] if you need to cancel or reschedule your appointment.   Please call 1-800-273-TALK (toll free, 24 hour hotline) if you are experiencing a Mental Health or Behavioral Health Crisis  or need someone to talk to.  Alyse Low, RN, BA, [facility], CRRN [facility] Coordinator, Transition of Care Ph # [phone]

## 2023-09-05 NOTE — Patient Outreach (Signed)
Care Management  Transitions of Care Program Transitions of Care Post-discharge week 3   09/04/2023 Name: Rachel [name] MRN: [id number] DOB: 1941/07/15  Subjective: Rachel [name] is a 82 y.o. year old female who is a primary care patient of Rema Fendt, NP. The Care Management team Engaged with patient Engaged with patient by telephone to assess and address transitions of care needs.   Consent to Services:  Patient was given information about care management services, agreed to services, and gave verbal consent to participate.   Assessment:           SDOH Interventions    Flowsheet Row Telephone from 08/28/2023 in [place] DEPARTMENT  SDOH Interventions   Food Insecurity Interventions AMB Referral  Financial Strain Interventions Other (Comment)  [referral to be made with BSW to explore resources available to patient to alleviate financial strain]        Goals Addressed             This Visit's Progress    Transition of Care       Current Barriers:  Knowledge Deficits related to plan of care for management of Atrial Fibrillation   RNCM Clinical Goal(s):  Patient will work with the Care Management team over the next 30 days to address Transition of Care Barriers: Medication access Medication Management Diet/Nutrition/Food Resources Support at home Provider appointments Assistance with obtaining needed Eliquis medication at an affordable price  through collaboration with Medical illustrator, provider, and care team.   Interventions: Evaluation of current treatment plan related to  self management and patient's adherence to plan as established by provider   AFIB Interventions: (Status:  Ongoing) Short Term Goal   Counseled on increased risk of stroke due to Afib and benefits of anticoagulation for stroke prevention Reviewed importance of adherence to anticoagulant exactly as prescribed Advised patient to discuss  obtaining Eliquis at an affordable price on 08/21/23 at the [facility] for hospital follow up with provider 12/4 televisit Update - patient stated that she felt overwhelmed with all the information she received @ the [facility] Clinic, including discussion of procedures and other therapies for her Afib and totally forgot to discuss her challenge of being able to afford Eliquis with provider Counseled on bleeding risk associated with taking anticoagulant medicine such as Eliquis and importance of self-monitoring for signs/symptoms of bleeding Counseled on avoidance of NSAIDs due to increased bleeding risk with anticoagulants Counseled on seeking medical attention after a head injury or if there is blood in the urine/stool Assessed social determinant of health barriers 12/4 Update - entered referral for Clinical Pharmacist to discuss medication regimen with patient including probable lifetime need for Eliquis and to help with possibly obtaining Eliquis at a cheaper out-of-pocket cost to patient  12/4 Update - entered referral for BSW involvement to help patient with SDOH challenges 12/11 Update - follow up by Octavio Graves, LCSW (Cardiology) to provide paperwork for assistance in paying for Eliquis co-pay  Patient Goals/Self-Care Activities: Take all medications as prescribed Attend all scheduled provider appointments Call pharmacy for medication refills 3-7 days in advance of running out of medications Call provider office for new concerns or questions   Follow Up Plan:  The patient has been provided with contact information for the care management team and has been advised to call with any health related questions or concerns.   Will follow up with patient again, via telephone call, on Thursday, 09/12/23 at 1 pm  Plan: The patient has been provided with contact information for the care management team and has been advised to call with any health related questions or concerns.   Alyse Low, RN, BA, [facility], CRRN [facility] Coordinator, Transition of Care Ph # [phone]

## 2023-09-05 NOTE — Patient Outreach (Signed)
  Care Management  Transitions of Care Program Transitions of Care Post-discharge - Happy 82th birthday on 09/05/23 call  09/05/2023 Name: Rachel [name] MRN: [id number] DOB: 05-07-1941  Subjective: Rachel [name] is a 82 y.o. year old female who is a primary care patient of Rema Fendt, NP. The Care Management RN called and spoke to the patient to wish her a Happy 82th Birthday today, 09/05/2023.  Plan: Additional outreach attempts will be made to reach the patient enrolled in the [facility] Program (Post Inpatient/ED Visit). Next appointment scheduled is 09/12/2023 @ 1pm.  Alyse Low, RN, BA, [facility], CRRN [facility] Population Health Care Management Coordinator, Transition of Care Ph # [phone]

## 2023-09-10 ENCOUNTER — Telehealth: Payer: Self-pay | Admitting: Licensed Clinical Social Worker

## 2023-09-10 NOTE — Telephone Encounter (Signed)
H&V Care Navigation CSW Progress Note  Clinical Social Worker contacted patient by phone to f/u on supporting documents for Eliquis patient assistance. No answer this morning, no notes indicating paperwork brought in. Left voicemail reminding her she needs to bring pharmacy print outs and her tax return to [facility] clinic. She will need to sign forms where noted so team can submit this for assistance. Remain available as needed.  Patient is participating in a Managed Medicaid Plan:  No, self pay only  SDOH Screenings   Food Insecurity: Food Insecurity Present (09/05/2023)  Housing: Unknown (09/05/2023)  Transportation Needs: No Transportation Needs (09/05/2023)  Utilities: Not At Risk (09/05/2023)  Alcohol Screen: Low Risk  (11/01/2021)  Depression (PHQ2-9): Low Risk  (08/19/2023)  Financial Resource Strain: High Risk (09/05/2023)  Physical Activity: Inactive (09/05/2023)  Tobacco Use: Low Risk  (09/05/2023)  Health Literacy: Adequate Health Literacy (09/05/2023)   Octavio Graves, MSW, LCSW Clinical Social Worker II [facility] Health Heart/Vascular Care Navigation  [phone]- work cell phone (preferred) [phone]- desk phone

## 2023-09-11 DIAGNOSIS — J45909 Unspecified asthma, uncomplicated: Secondary | ICD-10-CM | POA: Diagnosis not present

## 2023-09-12 ENCOUNTER — Other Ambulatory Visit: Payer: Self-pay

## 2023-09-12 ENCOUNTER — Telehealth: Payer: Self-pay

## 2023-09-12 NOTE — Patient Outreach (Signed)
  Care Management  Transitions of Care Program Transitions of Care Post-discharge week 3  09/12/2023 Name: Kelila Devaul MRN: [id number] DOB: 15-Jul-1941  Subjective: Rachel [name] is a 82 y.o. year old female who is a primary care patient of Rema Fendt, NP. The Care Management team was unable to reach the patient by phone to assess and address transitions of care needs.   Plan: Additional outreach attempts will be made to reach the patient enrolled in the [facility] Program (Post Inpatient/ED Visit).  Alyse Low, RN, BA, [facility], CRRN [facility] Coordinator, Transition of Care Ph # [phone]

## 2023-09-13 ENCOUNTER — Ambulatory Visit: Payer: Self-pay | Admitting: Licensed Clinical Social Worker

## 2023-09-13 NOTE — Patient Outreach (Signed)
  Care Coordination   09/13/2023 Name: Rachel [name] MRN: [id number] DOB: [birth year]   Care Coordination Outreach Attempts:  An unsuccessful outreach was attempted for an appointment today.  Follow Up Plan:  Additional outreach attempts will be made to offer the patient complex care management information and services.   Encounter Outcome:  No Answer   Care Coordination Interventions:  No, not indicated    Jeanie Cooks, PhD [facility], [facility] Social Worker Direct Dial: [phone]  Fax: [phone]

## 2023-09-16 ENCOUNTER — Telehealth: Payer: Self-pay | Admitting: Licensed Clinical Social Worker

## 2023-09-16 NOTE — Telephone Encounter (Signed)
H&V Care Navigation CSW Progress Note  Clinical Social Worker contacted caregiver by phone (pt daughter Clydie Braun [phone], DPR on file) to f/u on multiple unanswered calls to pt. Clydie Braun shares that pt often gets lots of spam calls, will call her and ask her to return my call. I shared that pt also has missed calls from [facility] staff as well. All appear to have left voicemails so I confirmed with pt daughter that she would be able to have her check voicemail and call her back.  Patient is participating in a Managed Medicaid Plan:  No, [employer] only  SDOH Screenings   Food Insecurity: Food Insecurity Present (09/05/2023)  Housing: Unknown (09/05/2023)  Transportation Needs: No Transportation Needs (09/05/2023)  Utilities: Not At Risk (09/05/2023)  Alcohol Screen: Low Risk  (11/01/2021)  Depression (PHQ2-9): Low Risk  (08/19/2023)  Financial Resource Strain: High Risk (09/05/2023)  Physical Activity: Inactive (09/05/2023)  Tobacco Use: Low Risk  (09/05/2023)  Health Literacy: Adequate Health Literacy (09/05/2023)    Octavio Graves, MSW, LCSW Clinical Social Worker II [facility] Health Heart/Vascular Care Navigation  [phone]- work cell phone (preferred) [phone]- desk phone

## 2023-09-19 ENCOUNTER — Other Ambulatory Visit: Payer: Self-pay

## 2023-09-19 ENCOUNTER — Telehealth: Payer: Self-pay

## 2023-09-19 NOTE — Patient Outreach (Signed)
  Care Management  Transitions of Care Program Transitions of Care Post-discharge week 4  09/19/2023 Name: Io Donnel MRN: [id number] DOB: 04/06/1941  Subjective: Rachel [name] is a 82 y.o. year old female who is a primary care patient of Rema Fendt, NP. The Care Management team was unable to reach the patient by phone to assess and address transitions of care needs.   Plan: Additional outreach attempts will be made to reach the patient enrolled in the [facility] Program (Post Inpatient/ED Visit).  Alyse Low, RN, BA, [facility], CRRN [facility] Coordinator, Transition of Care Ph # [phone]

## 2023-09-19 NOTE — Patient Outreach (Unsigned)
{  VBCI TOC PROGRAM NOTES:30402}

## 2023-09-20 ENCOUNTER — Telehealth (HOSPITAL_BASED_OUTPATIENT_CLINIC_OR_DEPARTMENT_OTHER): Payer: Self-pay | Admitting: Licensed Clinical Social Worker

## 2023-09-20 ENCOUNTER — Ambulatory Visit: Payer: PPO | Admitting: Family

## 2023-09-20 NOTE — Telephone Encounter (Signed)
H&V Care Navigation CSW Progress Note  Clinical Social Worker contacted patient by phone to f/u on supporting documents for Eliquis patient assistance. No answer this morning, no notes indicating paperwork brought in. Left voicemail reminding her she needs to bring pharmacy print outs and her tax return to [facility] clinic. She will need to sign forms where noted so team can submit this for assistance. Remain available as needed. I have also reached out to her daughter and requested pt f/u with our team and [facility]. Note no returned calls and that [facility] also has not received any return calls.     Patient is participating in a Managed Medicaid Plan:  No, self pay only  SDOH Screenings   Food Insecurity: Food Insecurity Present (09/05/2023)  Housing: Unknown (09/05/2023)  Transportation Needs: No Transportation Needs (09/05/2023)  Utilities: Not At Risk (09/05/2023)  Alcohol Screen: Low Risk  (11/01/2021)  Depression (PHQ2-9): Low Risk  (08/19/2023)  Financial Resource Strain: High Risk (09/05/2023)  Physical Activity: Inactive (09/05/2023)  Tobacco Use: Low Risk  (09/05/2023)  Health Literacy: Adequate Health Literacy (09/05/2023)    Octavio Graves, MSW, LCSW Clinical Social Worker II [facility] Health Heart/Vascular Care Navigation  [phone]- work cell phone (preferred) [phone]- desk phone

## 2023-09-26 ENCOUNTER — Other Ambulatory Visit (HOSPITAL_COMMUNITY): Payer: Self-pay | Admitting: Internal Medicine

## 2023-09-27 ENCOUNTER — Encounter: Payer: PPO | Admitting: Family

## 2023-09-27 NOTE — Progress Notes (Signed)
 Erroneous encounter-disregard

## 2023-10-01 ENCOUNTER — Other Ambulatory Visit: Payer: Self-pay | Admitting: Family

## 2023-10-01 DIAGNOSIS — E1165 Type 2 diabetes mellitus with hyperglycemia: Secondary | ICD-10-CM

## 2023-10-02 ENCOUNTER — Ambulatory Visit: Payer: Self-pay | Admitting: Licensed Clinical Social Worker

## 2023-10-02 NOTE — Patient Outreach (Signed)
  Care Coordination   10/02/2023 Name: Rachel [name] MRN: [id number] DOB: 07-01-1941   Care Coordination Outreach Attempts:  An unsuccessful outreach was attempted for an appointment today.  Follow Up Plan:  Additional outreach attempts will be made to offer the patient complex care management information and services.   Encounter Outcome:  No Answer   Care Coordination Interventions:  No, not indicated    Tobias CHARM Maranda HEDWIG, PhD [facility], [facility] Social Worker Direct Dial: [phone]  Fax: [phone]

## 2023-10-07 ENCOUNTER — Telehealth: Payer: Self-pay | Admitting: *Deleted

## 2023-10-07 NOTE — Progress Notes (Signed)
 Complex Care Management Care Guide Note  10/07/2023 Name: Rachel [name] MRN: [id number] DOB: 1940/11/17  Rachel [name] is a 83 y.o. year old female who is a primary care patient of Lorren Greig PARAS, NP and is actively engaged with the care management team. I reached out to Orlean Sebastian Erm by phone today to assist with re-scheduling  with the BSW.  Follow up plan: Unsuccessful telephone outreach attempt made. A HIPAA compliant phone message was left for the patient providing contact information and requesting a return call.  [facility]  Care Coordination Care Guide  Direct Dial: [phone]

## 2023-10-08 NOTE — Progress Notes (Signed)
 Complex Care Management Care Guide Note  10/08/2023 Name: Rachel [name] MRN: [id number] DOB: Jan 24, 1941  Rachel [name] is a 83 y.o. year old female who is a primary care patient of Lorren Greig PARAS, NP and is actively engaged with the care management team. I reached out to Orlean Sebastian Erm by phone today to assist with re-scheduling  with the BSW.  Follow up plan: Telephone appointment with complex care management team member scheduled for:  10/24/23  [facility]  Care Coordination Care Guide  Direct Dial: [phone]

## 2023-10-11 ENCOUNTER — Other Ambulatory Visit: Payer: Self-pay | Admitting: Family

## 2023-10-11 ENCOUNTER — Ambulatory Visit (INDEPENDENT_AMBULATORY_CARE_PROVIDER_SITE_OTHER): Payer: PPO | Admitting: Family

## 2023-10-11 VITALS — BP 168/70 | HR 62 | Temp 97.7°F | Ht 63.0 in | Wt 198.8 lb

## 2023-10-11 DIAGNOSIS — R040 Epistaxis: Secondary | ICD-10-CM

## 2023-10-11 DIAGNOSIS — J45909 Unspecified asthma, uncomplicated: Secondary | ICD-10-CM

## 2023-10-11 DIAGNOSIS — I10 Essential (primary) hypertension: Secondary | ICD-10-CM

## 2023-10-11 DIAGNOSIS — H9201 Otalgia, right ear: Secondary | ICD-10-CM

## 2023-10-11 MED ORDER — IPRATROPIUM BROMIDE HFA 17 MCG/ACT IN AERS: 1.0000 | INHALATION_SPRAY | Freq: Three times a day (TID) | RESPIRATORY_TRACT | 2 refills | Status: AC

## 2023-10-11 MED ORDER — ALBUTEROL SULFATE HFA 108 (90 BASE) MCG/ACT IN AERS: 1.0000 | INHALATION_SPRAY | Freq: Four times a day (QID) | RESPIRATORY_TRACT | 2 refills | Status: DC | PRN

## 2023-10-11 MED ORDER — AMOXICILLIN-POT CLAVULANATE 875-125 MG PO TABS: 1.0000 | ORAL_TABLET | Freq: Two times a day (BID) | ORAL | 0 refills | Status: DC

## 2023-10-11 NOTE — Progress Notes (Signed)
Patient states a really bad ear ache.   States asthma attack in her sleep.

## 2023-10-11 NOTE — Progress Notes (Signed)
Patient ID: Rachel [name], female    DOB: Dec 03, 1940  MRN: [id number]  CC: Chronic Conditions Follow-Up  Subjective: Rachel [name] is a 83 y.o. female who presents for chronic conditions follow-up.  Her concerns today include:  - Doing well on Albuterol inhaler and Ipratropium inhaler, no issues/concerns. Denies red flag symptoms associated with asthma. - Right ear pain. Denies red flag symptoms.  - Intermittent nosebleeds. Denies red flag symptoms. - Established with Cardiology. - Established with Endocrinology.    Patient Active Problem List   Diagnosis Date Noted   Typical atrial flutter (HCC) 08/16/2023   Acute kidney injury (HCC) 08/15/2023   Atrial fibrillation with RVR (HCC) 08/13/2023   CAD in native artery 07/17/2023   PAF (paroxysmal atrial fibrillation) (HCC) 02/24/2023   Near syncope 02/24/2023   Afib (HCC) 02/24/2023   (HFpEF) heart failure with preserved ejection fraction (HCC) 01/17/2023   Left shoulder pain 11/01/2021   Shortness of breath 11/01/2021   Candidiasis of skin 03/17/2019   Asthmatic bronchitis 08/06/2018   Mild cardiomegaly 01/03/2018   Well adult exam 12/14/2015   Acute UTI 12/14/2015   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   Bacterial pharyngitis 08/25/2014   Acute bronchitis 07/03/2014   Oral thrush 07/03/2014   Onychomycosis 02/18/2013   URI (upper respiratory infection) 07/11/2012   Cough 07/11/2012   Ganglion cyst 06/25/2011   Foot pain, left 06/25/2011   Otitis media 06/25/2011   Lipoma 11/03/2010   EUSTACHIAN TUBE DYSFUNCTION, RIGHT 02/18/2010   Asthma with acute exacerbation 11/02/2009   ONYCHOMYCOSIS, TOENAILS 05/18/2009   Hyperlipidemia 09/15/2008   DM2 (diabetes mellitus, type 2) (HCC) 04/19/2007   Essential hypertension 04/19/2007   Allergic rhinitis 04/19/2007   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     apixaban (ELIQUIS) 5 MG TABS tablet Take 1 tablet (5 mg total) by mouth 2 (two) times daily. 60 tablet 5   rosuvastatin (CRESTOR) 5 MG tablet Take 1 tablet (5 mg total) by mouth daily. 90 tablet 3   benzonatate (TESSALON PERLES) 100 MG capsule Take 1 capsule (100 mg total) by mouth 3 (three) times daily as needed for cough. 30 capsule 1   budesonide (PULMICORT) 0.25 MG/2ML nebulizer solution Take 2 mLs (0.25 mg total) by nebulization 2 (two) times daily. 60 mL 4   Continuous Glucose Receiver (FREESTYLE LIBRE 3 READER) DEVI 1 each by Does not apply route 4 (four) times daily -  before meals and at bedtime. 1 each 2   Continuous Glucose Sensor (FREESTYLE LIBRE 3 PLUS SENSOR) MISC 1 each by Other route 4 (four) times daily -  before meals and at bedtime. Change sensor every 15 days. 1 each 2   diclofenac Sodium (VOLTAREN) 1 % GEL Apply 2 g topically 4 (four) times daily as needed. 50 g 1   diltiazem (CARDIZEM CD) 180 MG 24 hr capsule TAKE 1 CAPSULE BY MOUTH EVERY DAY 30 capsule 3   fluticasone (FLONASE) 50 MCG/ACT nasal spray Place 2 sprays into both nostrils daily as needed for allergies or rhinitis.     furosemide (LASIX) 20 MG tablet Take 1 tablet (20 mg total) by mouth daily as needed. 10 tablet 0   glucose blood (ONETOUCH VERIO) test strip Use to check blood sugars once a day 100 strip 5  loratadine (CLARITIN) 10 MG tablet TAKE 1 TABLET BY MOUTH EVERY DAY (DRUG NOT COVERED BY INS) (Patient taking differently: Take 10 mg by mouth daily.) 90 tablet 0   Multiple Vitamins-Minerals (CENTRUM SILVER 50+WOMEN) TABS Take 1 tablet by mouth daily.     mupirocin cream (BACTROBAN) 2 % Apply 1 Application topically 2 (two) times daily. 60 g 0   nystatin (MYCOSTATIN/NYSTOP) powder Apply topically 4 (four) times daily. 60 g 0   OneTouch Delica Lancets 30G MISC E11.9 100 each 3   sitaGLIPtin (JANUVIA) 25 MG tablet Take 1  tablet (25 mg total) by mouth daily. 30 tablet 1   No current facility-administered medications on file prior to visit.    Allergies  Allergen Reactions   Atorvastatin Other (See Comments)    Caused aching in the body   Glimepiride Diarrhea   Metformin Diarrhea    Social History   Socioeconomic History   Marital status: Widowed    Spouse name: Not on file   Number of children: Not on file   Years of education: Not on file   Highest education level: Not on file  Occupational History   Occupation: department mang    Employer: [employer]    Comment: Retired  Tobacco Use   Smoking status: Never    Passive exposure: Never   Smokeless tobacco: Never   Tobacco comments:    Never smoked 08/21/23  Vaping Use   Vaping status: Never Used  Substance and Sexual Activity   Alcohol use: Yes    Alcohol/week: 1.0 standard drink of alcohol    Types: 1 Glasses of wine per week    Comment: ocassionally   Drug use: No   Sexual activity: Not Currently  Other Topics Concern   Not on file  Social History Narrative   No regular exercise   Widowed   Social Drivers of Health   Financial Resource Strain: High Risk (09/05/2023)   Overall Financial Resource Strain (CARDIA)    Difficulty of Paying Living Expenses: Hard  Food Insecurity: Food Insecurity Present (09/05/2023)   Hunger Vital Sign    Worried About Running Out of Food in the Last Year: Sometimes true    Ran Out of Food in the Last Year: Sometimes true  Transportation Needs: No Transportation Needs (09/05/2023)   PRAPARE - Administrator, Civil Service (Medical): No    Lack of Transportation (Non-Medical): No  Physical Activity: Inactive (09/05/2023)   Exercise Vital Sign    Days of Exercise per Week: 0 days    Minutes of Exercise per Session: 0 min  Stress: Not on file  Social Connections: Not on file  Intimate Partner Violence: Not At Risk (09/05/2023)   Humiliation, Afraid, Rape, and Kick questionnaire     Fear of Current or Ex-Partner: No    Emotionally Abused: No    Physically Abused: No    Sexually Abused: No    Family History  Problem Relation Age of Onset   Diabetes Mother    Heart attack Mother    Cancer Father        brain   Diabetes Sister    Hypertension Sister    Diabetes Brother    Hypertension Brother    Coronary artery disease Brother    Aneurysm Other        niece    Past Surgical History:  Procedure Laterality Date   ACNE CYST REMOVAL  1970   back   APPENDECTOMY  1963  CARDIOVERSION N/A 08/16/2023   Procedure: CARDIOVERSION (CATH LAB);  Surgeon: Sande Rives, MD;  Location: [facility] INVASIVE CV LAB;  Service: Cardiovascular;  Laterality: N/A;   LEFT HEART CATH AND CORONARY ANGIOGRAPHY N/A 02/25/2023   Procedure: LEFT HEART CATH AND CORONARY ANGIOGRAPHY;  Surgeon: Kathleene Hazel, MD;  Location: [facility] INVASIVE CV LAB;  Service: Cardiovascular;  Laterality: N/A;   OOPHORECTOMY  1985   TRANSESOPHAGEAL ECHOCARDIOGRAM (CATH LAB) N/A 08/16/2023   Procedure: TRANSESOPHAGEAL ECHOCARDIOGRAM;  Surgeon: Sande Rives, MD;  Location: [facility] INVASIVE CV LAB;  Service: Cardiovascular;  Laterality: N/A;   VAGINAL HYSTERECTOMY  1985   complete    ROS: Review of Systems Negative except as stated above  PHYSICAL EXAM: BP (!) 168/70   Pulse 62   Temp 97.7 F (36.5 C) (Oral)   Ht 5\' 3"  (1.6 m)   Wt 198 lb 12.8 oz (90.2 kg)   SpO2 96%   BMI 35.22 kg/m   Physical Exam HENT:     Head: Normocephalic and atraumatic.     Right Ear: Tympanic membrane, ear canal and external ear normal.     Left Ear: Tympanic membrane, ear canal and external ear normal.     Nose: Nose normal.     Mouth/Throat:     Mouth: Mucous membranes are moist.     Pharynx: Oropharynx is clear.  Eyes:     Extraocular Movements: Extraocular movements intact.     Conjunctiva/sclera: Conjunctivae normal.     Pupils: Pupils are equal, round, and reactive to light.  Cardiovascular:      Rate and Rhythm: Normal rate and regular rhythm.     Pulses: Normal pulses.     Heart sounds: Normal heart sounds.  Pulmonary:     Effort: Pulmonary effort is normal.     Breath sounds: Normal breath sounds.  Musculoskeletal:        General: Normal range of motion.     Cervical back: Normal range of motion and neck supple.  Neurological:     General: No focal deficit present.     Mental Status: She is alert and oriented to person, place, and time.  Psychiatric:        Mood and Affect: Mood normal.        Behavior: Behavior normal.     ASSESSMENT AND PLAN: 1. Asthma, unspecified asthma severity, unspecified whether complicated, unspecified whether persistent (Primary) - Continue Albuterol inhaler and Ipratropium inhaler as prescribed. Counseled on medication adherence/adverse effects.  - Referral to Pulmonology for evaluation/management.  - Follow-up with primary provider as scheduled.  - albuterol (VENTOLIN HFA) 108 (90 Base) MCG/ACT inhaler; Inhale 1-2 puffs into the lungs every 6 (six) hours as needed for wheezing or shortness of breath. INHALE 1-2 PUFFS BY MOUTH EVERY 6 HOURS AS NEEDED FOR WHEEZE OR SHORTNESS OF BREATH  Dispense: 18 g; Refill: 2 - ipratropium (ATROVENT HFA) 17 MCG/ACT inhaler; Inhale 1 puff into the lungs 3 (three) times daily.  Dispense: 12.9 g; Refill: 2 - Ambulatory referral to Pulmonology  2. Right ear pain - Amoxicillin-Clavulanate as prescribed. Counseled on medication adherence/adverse effects.  - Follow-up with primary provider as scheduled.  - amoxicillin-clavulanate (AUGMENTIN) 875-125 MG tablet; Take 1 tablet by mouth 2 (two) times daily.  Dispense: 20 tablet; Refill: 0  3. Epistaxis - Referral to ENT for evaluation/management. - Ambulatory referral to ENT   Patient was given the opportunity to ask questions.  Patient verbalized understanding of the plan and was able to repeat  key elements of the plan. Patient was given clear instructions to go to  Emergency Department or return to medical center if symptoms don't improve, worsen, or new problems develop.The patient verbalized understanding.   Orders Placed This Encounter  Procedures   Ambulatory referral to Pulmonology   Ambulatory referral to ENT     Requested Prescriptions   Signed Prescriptions Disp Refills   albuterol (VENTOLIN HFA) 108 (90 Base) MCG/ACT inhaler 18 g 2    Sig: Inhale 1-2 puffs into the lungs every 6 (six) hours as needed for wheezing or shortness of breath. INHALE 1-2 PUFFS BY MOUTH EVERY 6 HOURS AS NEEDED FOR WHEEZE OR SHORTNESS OF BREATH   ipratropium (ATROVENT HFA) 17 MCG/ACT inhaler 12.9 g 2    Sig: Inhale 1 puff into the lungs 3 (three) times daily.   amoxicillin-clavulanate (AUGMENTIN) 875-125 MG tablet 20 tablet 0    Sig: Take 1 tablet by mouth 2 (two) times daily.    Follow-up with primary provider as scheduled.   Rema Fendt, NP

## 2023-10-12 DIAGNOSIS — J45909 Unspecified asthma, uncomplicated: Secondary | ICD-10-CM | POA: Diagnosis not present

## 2023-10-18 ENCOUNTER — Telehealth: Payer: Self-pay | Admitting: Cardiovascular Disease

## 2023-10-18 NOTE — Telephone Encounter (Signed)
Pt has been having a nose bleed about every 3 days. This morning was worse. She wants to talk to a nurse.

## 2023-10-18 NOTE — Telephone Encounter (Signed)
Spoke with pt, she reports a nose bleed but just on the right side. She reports she could feel it draining down her throat for about 1 hour. She reports a pressure in her neck, ear and nose on that right side. Encouraged her to use humidifier and saline nasal spray. Also encouraged her to follow up with her medical doctor to make sure nothing else is going on with the right side. She will continue to monitor and call with concerns.

## 2023-10-21 ENCOUNTER — Other Ambulatory Visit: Payer: Self-pay | Admitting: Family

## 2023-10-21 DIAGNOSIS — R059 Cough, unspecified: Secondary | ICD-10-CM

## 2023-10-23 NOTE — Telephone Encounter (Signed)
Complete

## 2023-10-24 ENCOUNTER — Ambulatory Visit: Payer: Self-pay | Admitting: Licensed Clinical Social Worker

## 2023-10-24 NOTE — Patient Outreach (Signed)
  Care Coordination   Initial Visit Note   10/24/2023 Name: Rinoa Garramone MRN: [id number] DOB: July 26, 1941  Tomi Grandpre is a 83 y.o. year old female who sees Rema Fendt, NP for primary care. I spoke with  [name] by phone today.  What matters to the patients health and wellness today?  Food insecurities and utility resources     Goals Addressed             This Visit's Progress    Care Coordination Activities       Care Coordination Interventions: Patient stated that she is currently on social security and receives $1700 per month and on a fixed income and after paying her bills and house payment that she does buy groceries. She has never applied for food stamps because she felt that she made to much. She agreed to allow the SW to send her a paper copy of the SNAP/Food Stamp application. SW also educated the patient on community food pantry's and agreed to allow the SW to send a list of food pantry's. Patient stated that her Gas bill was higher than usual and it was over $300 and she is the only one in the home. SW educated the patient on calling the gas company and making an arrangement if needed. SW also will mail a list of utility resources if needed in the future.  SW asked other SDOH questions and no other needs at this time SW will follow up on 11/08/2023 at 11:30 am        SDOH assessments and interventions completed:  Yes  SDOH Interventions Today    Flowsheet Row Most Recent Value  SDOH Interventions   Food Insecurity Interventions Other (Comment), Assist with SNAP Application  [sw will mail the snap application a list of food pantry]  Housing Interventions Intervention Not Indicated  Transportation Interventions Intervention Not Indicated  Utilities Interventions Community Resources Provided  [sw will send list for utilities]        Care Coordination Interventions:  Yes, provided  Interventions Today    Flowsheet  Row Most Recent Value  General Interventions   General Interventions Discussed/Reviewed General Interventions Discussed, Community Resources  [SW will mail food pntry list and utility assistace list and the snap application]        Follow up plan: Follow up call scheduled for 11/08/2023 at 11:30 am    Encounter Outcome:  Patient Visit Completed   Jeanie Cooks, PhD [facility], [facility] Social Worker Direct Dial: [phone]  Fax: [phone]

## 2023-10-24 NOTE — Patient Instructions (Signed)
Visit Information  Thank you for taking time to visit with me today. Please don't hesitate to contact me if I can be of assistance to you.   Following are the goals we discussed today:   Goals Addressed             This Visit's Progress    Care Coordination Activities       Care Coordination Interventions: Patient stated that she is currently on social security and receives $1700 per month and on a fixed income and after paying her bills and house payment that she does buy groceries. She has never applied for food stamps because she felt that she made to much. She agreed to allow the SW to send her a paper copy of the SNAP/Food Stamp application. SW also educated the patient on community food pantry's and agreed to allow the SW to send a list of food pantry's. Patient stated that her Gas bill was higher than usual and it was over $300 and she is the only one in the home. SW educated the patient on calling the gas company and making an arrangement if needed. SW also will mail a list of utility resources if needed in the future.  SW asked other SDOH questions and no other needs at this time SW will follow up on 11/08/2023 at 11:30 am        Our next appointment is by telephone on 11/08/2023 at 11:30 am  Please call the care guide team at [phone] if you need to cancel or reschedule your appointment.   If you are experiencing a Mental Health or Behavioral Health Crisis or need someone to talk to, please call the Suicide and Crisis Lifeline: 988 go to [facility] Urgent [facility] [address], [place] [phone]) call 911  Patient verbalizes understanding of instructions and care plan provided today and agrees to view in MyChart. Active MyChart status and patient understanding of how to access instructions and care plan via MyChart confirmed with patient.      Jeanie Cooks, PhD [facility], [facility] Social  Worker Direct Dial: [phone]  Fax: [phone]

## 2023-10-27 ENCOUNTER — Other Ambulatory Visit (HOSPITAL_BASED_OUTPATIENT_CLINIC_OR_DEPARTMENT_OTHER): Payer: Self-pay | Admitting: Family

## 2023-10-27 DIAGNOSIS — E785 Hyperlipidemia, unspecified: Secondary | ICD-10-CM

## 2023-10-27 DIAGNOSIS — D6859 Other primary thrombophilia: Secondary | ICD-10-CM

## 2023-10-27 DIAGNOSIS — R040 Epistaxis: Secondary | ICD-10-CM

## 2023-10-27 DIAGNOSIS — I480 Paroxysmal atrial fibrillation: Secondary | ICD-10-CM

## 2023-10-27 DIAGNOSIS — I10 Essential (primary) hypertension: Secondary | ICD-10-CM

## 2023-10-27 DIAGNOSIS — I25118 Atherosclerotic heart disease of native coronary artery with other forms of angina pectoris: Secondary | ICD-10-CM

## 2023-10-28 NOTE — Telephone Encounter (Signed)
Prescription refill request for Eliquis received. Indication:afib Last office visit:11/24 Scr:1.43  11/24 Age: 83 Weight:90.2  kg  Prescription refilled

## 2023-10-31 ENCOUNTER — Other Ambulatory Visit: Payer: Self-pay | Admitting: Family

## 2023-10-31 ENCOUNTER — Other Ambulatory Visit: Payer: Self-pay

## 2023-10-31 DIAGNOSIS — E1165 Type 2 diabetes mellitus with hyperglycemia: Secondary | ICD-10-CM

## 2023-10-31 MED ORDER — FREESTYLE LIBRE 3 READER DEVI: 1.0000 | Freq: Three times a day (TID) | 2 refills | Status: AC

## 2023-11-08 ENCOUNTER — Ambulatory Visit: Payer: Self-pay | Admitting: Licensed Clinical Social Worker

## 2023-11-08 NOTE — Patient Outreach (Signed)
  Care Coordination   11/08/2023 Name: Rachel [name] MRN: [id number] DOB: [birth year]   Care Coordination Outreach Attempts:  An unsuccessful outreach was attempted for an appointment today.  Follow Up Plan:  Additional outreach attempts will be made to offer the patient complex care management information and services.   Encounter Outcome:  No Answer   Care Coordination Interventions:  No, not indicated    SW attempted this 1st follow up call today and will a 2nd attempt on 11/25/2023 at 11:00 am  Jeanie Cooks, PhD [facility], [facility] Social Worker Direct Dial: [phone]  Fax: [phone]

## 2023-11-12 DIAGNOSIS — J45909 Unspecified asthma, uncomplicated: Secondary | ICD-10-CM | POA: Diagnosis not present

## 2023-11-18 ENCOUNTER — Encounter (HOSPITAL_BASED_OUTPATIENT_CLINIC_OR_DEPARTMENT_OTHER): Payer: Self-pay | Admitting: Cardiovascular Disease

## 2023-11-18 ENCOUNTER — Ambulatory Visit (HOSPITAL_BASED_OUTPATIENT_CLINIC_OR_DEPARTMENT_OTHER): Payer: PPO | Admitting: Cardiovascular Disease

## 2023-11-18 VITALS — BP 166/64 | HR 60 | Ht 63.0 in | Wt 198.9 lb

## 2023-11-18 DIAGNOSIS — I10 Essential (primary) hypertension: Secondary | ICD-10-CM | POA: Diagnosis not present

## 2023-11-18 DIAGNOSIS — Z5181 Encounter for therapeutic drug level monitoring: Secondary | ICD-10-CM

## 2023-11-18 DIAGNOSIS — I5033 Acute on chronic diastolic (congestive) heart failure: Secondary | ICD-10-CM

## 2023-11-18 DIAGNOSIS — I2510 Atherosclerotic heart disease of native coronary artery without angina pectoris: Secondary | ICD-10-CM

## 2023-11-18 DIAGNOSIS — E7800 Pure hypercholesterolemia, unspecified: Secondary | ICD-10-CM | POA: Diagnosis not present

## 2023-11-18 DIAGNOSIS — I480 Paroxysmal atrial fibrillation: Secondary | ICD-10-CM

## 2023-11-18 MED ORDER — VALSARTAN-HYDROCHLOROTHIAZIDE 160-25 MG PO TABS: 1.0000 | ORAL_TABLET | Freq: Every day | ORAL | 3 refills | Status: DC

## 2023-11-18 MED ORDER — APIXABAN 5 MG PO TABS: 5.0000 mg | ORAL_TABLET | Freq: Two times a day (BID) | ORAL | Status: DC

## 2023-11-18 NOTE — Patient Instructions (Signed)
 Medication Instructions:  START VALSARTAN-HCT 160-25 MG DAILY   Labwork: FASTING LP/CMET IN 1 WEEK   Testing/Procedures: NONE  Follow-Up: 2 MONTHS WITH CAITLIN W NP OR DR [place] [facility]   Any Other Special Instructions Will Be Listed Below (If Applicable). MONITOR AND LOG YOUR BLOOD PRESSURE. BRING YOUR READINGS AND MACHINE TO FOLLOW UP   WORK ON INCREASING YOUR EXERCISE   If you need a refill on your cardiac medications before your next appointment, please call your pharmacy.

## 2023-11-18 NOTE — Progress Notes (Signed)
 Advanced [facility]pertension [facility] Follow-up:    Date:  11/18/2023   ID:  Rachel [name], Rachel [name] 1941-01-08, MRN [id number]  PCP:  Rema Fendt, NP  Cardiologist:  Chilton Si, MD   Referring MD: Rema Fendt, NP   CC: Hypertension  History of Present Illness:    Rachel [name] is a 83 y.o. female with a hx of nonobstructive CAD, atrial fibrillation, hypertension, asthma, diabetes, here for follow-up. She was initially seen 11/01/2021 in the [facility]. She had a televisit 06/2021 and her blood pressure was 157/87 on amlodipine, losartan, and HCTZ. Amlodipine was increased to 10 mg. She previously had an Echo in 2019 which showed LVEF 65-70% and grade 1 diastolic dysfunction.  At her initial visit, she reported home blood pressures averaging 160-187 systolic prior to taking her medications. She had mildly elevated neck veins on exam. She had no symptoms of pheochromocytoma. Given that she was on ARB we did not check for hyperaldosteronism. We restarted her on 5 mg amlodipine daily (previously intolerant of 10 mg dose but was stable on 5 mg). Losartan/HCTZ was switched to valsartan/HCTZ 320/25 mg daily. She was also started on spironolactone 25 mg daily. She was referred to PREP. Renal artery doppler and echocardiogram were ordered but not completed. Later she reported significant LE swelling, so amlodipine was reduced from 5 mg to 2.5 mg daily. On 01/13/2023 she presented to the ED with complaints of worsening DOE for several days in the setting of months of leg swelling. Noted to be hypertensive in triage but otherwise without distress. BNP was normal. She was discharged on 20 mg furosemide BID.    She was seen 12/2022 and her home blood pressures were averaging 165-167 systolic. This was similar to her BP in the office. There had been some confusion with her medications and some had been lost from her regime. We discontinued hospital-prescribed diuretics  and restarted amlodipine, spironolactone, and valsartan/HCTZ. She had been hesitant to refill her atorvastatin after hearing about potential side effects. She was encouraged to restart her cholesterol medication.  From 6/2-02/27/2023 she was admitted after presenting with syncope and new onset atrial fibrillation with RVR. She also complained of chest pain and troponins were elevated, felt probably due to demand ischemia. She underwent left heart catheterization 02/25/23 showing mild nonobstructive CAD (prox RCA 20%, ost-proxCx 20%, ost-prox LAD 20%, mid LAD 20%). Echo during admission showed LVEF 65 to 70%, no RWMA, normal RV function, no significant valvular abnormalities. She reverted to sinus bradycardia with PO diltiazem and Toprol prior to discharge. Spironolactone had been reduced and amlodipine was held. She followed up with Gillian Shields, NP 03/08/23 and noted home blood pressures in systolic 160s-170s although she was checking prior to taking her medications. In the office her BP was 136/86. She was maintaining sinus bradycardia and denied palpitations. Noted to be intolerant of atorvastatin 20 mg and she was switched to 5 mg rosuvastatin daily.  At her visit 06/2023 she noted feeling fatigued and having increased exertional dyspnea.  Blood pressures were uncontrolled.  She had an episode of syncope at which time her blood pressure was almost 200 systolic.  Spironolactone was increased.  She was referred to the prep program at the [facility].  She was admitted to the hospital 07/2023 with atrial flutter with RVR and syncope.  She was transition to diltiazem.  She underwent TEE/DCCV given that she had missed a dose of Eliquis.  Interestingly, blood pressures were well-controlled in the hospital on  diltiazem only despite having uncontrolled blood pressures at home.  Valsartan/HCT was added back prior to discharge from the hospital.  She was seen in A-fib clinic 07/2023 and remained in sinus rhythm.  She noticed  ankle swelling since starting diltiazem.  They discussed rhythm control options.  She was given Lasix and has not yet followed up with them.  Rachel [name] presents with concerns about exercise and blood pressure management. She is accompanied by her daughter.  She has a history of atrial fibrillation and is currently taking diltiazem and Eliquis. She uses a KardiaMobile device to monitor her heart rhythm and has not noticed any recent issues with atrial fibrillation. She feels tired but not to the extent of being unable to move.  Her blood pressure readings at home have been around 170 to 180 systolic, with the diastolic around 70. She is currently taking diltiazem 180 mg but has not been checking her blood pressure regularly. Her blood pressure was previously around 130s. She recalls being on multiple blood pressure medications in the past, including valsartan, hydrochlorothiazide, spironolactone, and metoprolol, which were stopped during a recent hospital stay.  She is concerned about her ability to exercise due to fear of passing out and experiencing shortness of breath and fatigue. She becomes exhausted and short of breath after descending stairs and needs to rest before continuing with her activities. She has a recliner in her kitchen and is considering engaging in chair exercises. She is interested in participating in a free exercise program at the [facility], which is run by a nurse.  She experiences frequent epistaxis from her right nostril, occurring approximately every eight to ten days. She describes the sensation of blood going down her throat, which she finds distressing. She is scheduled to see an ear, nose, and throat specialist tomorrow for further evaluation.  She discusses her medication routine, taking her diltiazem with breakfast, and mentions not taking it on an empty stomach.  She experiences frequent nighttime urination, getting up about three times a night, but denies any burning  sensation during urination. She attributes this to drinking a lot of water throughout the day. She is not currently on a diuretic and has not been taking furosemide.     Previous antihypertensives: Amlodipine-increased edema at higher doses  Past Medical History:  Diagnosis Date   (HFpEF) heart failure with preserved ejection fraction (HCC) 01/17/2023   Allergic rhinitis    CAD in native artery 07/17/2023   Diabetes mellitus, type 2 (HCC)    Hyperkalemia 08/14/2023   Hyperlipemia    Hypertension    Left shoulder pain 11/01/2021   Lipoma NEC    anterior upper chest   Osteoarthritis of knee    PAF (paroxysmal atrial fibrillation) (HCC) 02/24/2023   Shortness of breath 11/01/2021    Past Surgical History:  Procedure Laterality Date   ACNE CYST REMOVAL  1970   back   APPENDECTOMY  1963   CARDIOVERSION N/A 08/16/2023   Procedure: CARDIOVERSION (CATH LAB);  Surgeon: Sande Rives, MD;  Location: [facility] INVASIVE CV LAB;  Service: Cardiovascular;  Laterality: N/A;   LEFT HEART CATH AND CORONARY ANGIOGRAPHY N/A 02/25/2023   Procedure: LEFT HEART CATH AND CORONARY ANGIOGRAPHY;  Surgeon: Kathleene Hazel, MD;  Location: [facility] INVASIVE CV LAB;  Service: Cardiovascular;  Laterality: N/A;   OOPHORECTOMY  1985   TRANSESOPHAGEAL ECHOCARDIOGRAM (CATH LAB) N/A 08/16/2023   Procedure: TRANSESOPHAGEAL ECHOCARDIOGRAM;  Surgeon: Sande Rives, MD;  Location: [facility] INVASIVE CV LAB;  Service: Cardiovascular;  Laterality: N/A;   VAGINAL HYSTERECTOMY  1985   complete    Current Medications: Current Meds  Medication Sig   acetaminophen (TYLENOL) 500 MG tablet Take 500 mg by mouth every 8 (eight) hours as needed for mild pain or headache.   albuterol (VENTOLIN HFA) 108 (90 Base) MCG/ACT inhaler Inhale 1-2 puffs into the lungs every 6 (six) hours as needed for wheezing or shortness of breath. INHALE 1-2 PUFFS BY MOUTH EVERY 6 HOURS AS NEEDED FOR WHEEZE OR SHORTNESS OF BREATH   apixaban  (ELIQUIS) 5 MG TABS tablet Take 1 tablet (5 mg total) by mouth 2 (two) times daily for 14 days.   benzonatate (TESSALON) 100 MG capsule TAKE 1 CAPSULE BY MOUTH THREE TIMES A DAY AS NEEDED FOR COUGH   budesonide (PULMICORT) 0.25 MG/2ML nebulizer solution Take 2 mLs (0.25 mg total) by nebulization 2 (two) times daily.   Continuous Glucose Receiver (FREESTYLE LIBRE 3 READER) DEVI 1 each by Does not apply route 4 (four) times daily -  before meals and at bedtime.   Continuous Glucose Sensor (FREESTYLE LIBRE 3 PLUS SENSOR) MISC USE 1 SENSOR AND CHANGE EVERY 15 DAYS TO CHECK BLOOD SUGAR 4 TIMES DAILY BEFORE MEALS AND BEDTIME.   diclofenac Sodium (VOLTAREN) 1 % GEL Apply 2 g topically 4 (four) times daily as needed.   diltiazem (CARDIZEM CD) 180 MG 24 hr capsule TAKE 1 CAPSULE BY MOUTH EVERY DAY   ELIQUIS 5 MG TABS tablet TAKE 1 TABLET BY MOUTH TWICE A DAY   fluticasone (FLONASE) 50 MCG/ACT nasal spray Place 2 sprays into both nostrils daily as needed for allergies or rhinitis.   ipratropium (ATROVENT HFA) 17 MCG/ACT inhaler Inhale 1 puff into the lungs 3 (three) times daily.   loratadine (CLARITIN) 10 MG tablet TAKE 1 TABLET BY MOUTH EVERY DAY (DRUG NOT COVERED BY INS) (Patient taking differently: Take 10 mg by mouth daily.)   Multiple Vitamins-Minerals (CENTRUM SILVER 50+WOMEN) TABS Take 1 tablet by mouth daily.   mupirocin cream (BACTROBAN) 2 % Apply 1 Application topically 2 (two) times daily.   nystatin (MYCOSTATIN/NYSTOP) powder Apply topically 4 (four) times daily.   sitaGLIPtin (JANUVIA) 25 MG tablet Take 1 tablet (25 mg total) by mouth daily.   valsartan-hydrochlorothiazide (DIOVAN HCT) 160-25 MG tablet Take 1 tablet by mouth daily.     Allergies:   Atorvastatin, Glimepiride, and Metformin   Social History   Socioeconomic History   Marital status: Widowed    Spouse name: Not on file   Number of children: Not on file   Years of education: Not on file   Highest education level: Not on file   Occupational History   Occupation: department mang    Employer: [employer]    Comment: Retired  Tobacco Use   Smoking status: Never    Passive exposure: Never   Smokeless tobacco: Never   Tobacco comments:    Never smoked 08/21/23  Vaping Use   Vaping status: Never Used  Substance and Sexual Activity   Alcohol use: Yes    Alcohol/week: 1.0 standard drink of alcohol    Types: 1 Glasses of wine per week    Comment: ocassionally   Drug use: No   Sexual activity: Not Currently  Other Topics Concern   Not on file  Social History Narrative   No regular exercise   Widowed   Social Drivers of Health   Financial Resource Strain: High Risk (09/05/2023)   Overall Financial Resource Strain (CARDIA)  Difficulty of Paying Living Expenses: Hard  Food Insecurity: Food Insecurity Present (10/24/2023)   Hunger Vital Sign    Worried About Running Out of Food in the Last Year: Sometimes true    Ran Out of Food in the Last Year: Sometimes true  Transportation Needs: No Transportation Needs (10/24/2023)   PRAPARE - Administrator, Civil Service (Medical): No    Lack of Transportation (Non-Medical): No  Physical Activity: Inactive (09/05/2023)   Exercise Vital Sign    Days of Exercise per Week: 0 days    Minutes of Exercise per Session: 0 min  Stress: Not on file  Social Connections: Not on file     Family History: The patient's family history includes Aneurysm in an other family member; Cancer in her father; Coronary artery disease in her brother; Diabetes in her brother, mother, and sister; Heart attack in her mother; Hypertension in her brother and sister.  ROS:   Please see the history of present illness.    (+) Fatigue (+) Occasional palpitations (+) Soreness of right hand All other systems reviewed and are negative.  EKGs/Labs/Other Studies Reviewed:    Left Heart Cath  02/25/2023:   Prox RCA lesion is 20% stenosed.   Ost Cx to Prox Cx lesion is 20%  stenosed.   Ost LAD to Prox LAD lesion is 20% stenosed.   Mid LAD lesion is 20% stenosed.   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 Her troponin elevation is likely due to demand ischemia in the setting of atrial fib with RVR.   Echo  02/25/2023:  1. Left ventricular ejection fraction, by estimation, is 65 to 70%. The  left ventricle has normal function. The left ventricle has no regional  wall motion abnormalities. There is mild concentric left ventricular  hypertrophy. Left ventricular diastolic  function could not be evaluated.   2. Right ventricular systolic function is normal. The right ventricular  size is normal.   3. Left atrial size was mildly dilated.   4. The mitral valve is normal in structure. Trivial mitral valve  regurgitation. No evidence of mitral stenosis.   5. The aortic valve is normal in structure. Aortic valve regurgitation is  not visualized. No aortic stenosis is present.   6. The inferior vena cava is normal in size with greater than 50%  respiratory variability, suggesting right atrial pressure of 3 mmHg.   Chest X-Ray  01/13/2023: IMPRESSION: 1. Mild cardiomegaly and increased central vascular prominence without overt edema. 2. No other evidence for acute chest process. Linear scarring or atelectasis left mid field.  Echo 01/09/2018: Study Conclusions  - Left ventricle: Wall thickness was increased in a pattern of    moderate LVH. Systolic function was vigorous. The estimated    ejection fraction was in the range of 65% to 70%. Wall motion was    normal; there were no regional wall motion abnormalities. Doppler    parameters are consistent with abnormal left ventricular    relaxation (grade 1 diastolic dysfunction).  - Aortic valve: Peak gradient 18 mmHg across the aortic valve    likely represents LV outflow tract gradient rather than valvular    AS, the valve appears to open well. Peak  gradient (S): 18 mm Hg.  - Mitral valve: There was mild regurgitation.  - Right ventricle: The cavity size was normal. Systolic function    was normal.  - Tricuspid valve: Peak RV-RA gradient (S): 37  mm Hg.  - Pulmonary arteries: PA peak pressure: 40 mm Hg (S).  - Inferior vena cava: The vessel was normal in size. The    respirophasic diameter changes were in the normal range (>= 50%),    consistent with normal central venous pressure.   Impressions:  - Normal LV size with moderate LV hypertrophy. EF 65-70%, vigorous    systolic function. Normal RV size and systolic function. Mild    gradient across the LV outflow tract is likely due to    LVH/vigorous LV systolic function rather than valvular AS.   EKG:  EKG is personally reviewed. 07/17/2023:  Not ordered. 01/17/2023:  EKG was not ordered. 11/01/2021: Sinus rhythm. Rate 66 bpm.  Recent Labs: 08/13/2023: B Natriuretic Peptide 171.2; Magnesium 1.8; TSH 3.369 08/14/2023: Hemoglobin 11.5; Platelets 301 08/16/2023: BUN 40; Creatinine, Ser 1.43; Potassium 4.5; Sodium 136   Recent Lipid Panel    Component Value Date/Time   CHOL 223 (H) 09/10/2022 1554   TRIG 196 (H) 09/10/2022 1554   HDL 42 09/10/2022 1554   CHOLHDL 5.3 (H) 09/10/2022 1554   CHOLHDL 5 01/12/2020 1350   VLDL 45.2 (H) 01/12/2020 1350   LDLCALC 145 (H) 09/10/2022 1554   LDLDIRECT 139.0 01/12/2020 1350    Physical Exam:    VS:  BP (!) 166/64   Pulse 60   Ht 5\' 3"  (1.6 m)   Wt 198 lb 14.4 oz (90.2 kg)   SpO2 98%   BMI 35.23 kg/m  , BMI Body mass index is 35.23 kg/m. GENERAL:  Well appearing HEENT: Pupils equal round and reactive, fundi not visualized, oral mucosa unremarkable NECK: No JVP.  Waveform within normal limits, carotid upstroke brisk and symmetric, no bruits, no thyromegaly LUNGS:  Clear to auscultation bilaterally HEART:  RRR.  PMI not displaced or sustained,S1 and S2 within normal limits, no S3, no S4, no clicks, no rubs, no murmurs ABD:  Flat,  positive bowel sounds normal in frequency in pitch, no bruits, no rebound, no guarding, no midline pulsatile mass, no hepatomegaly, no splenomegaly EXT:  2 plus pulses throughout, no LE edema bilaterally, no cyanosis no clubbing SKIN:  No rashes no nodules NEURO:  Cranial nerves II through XII grossly intact, motor grossly intact throughout PSYCH:  Cognitively intact, oriented to person place and time   ASSESSMENT/PLAN:    # Epistaxis Recurrent right-sided nosebleeds, with an ENT appointment scheduled for 11/21/2023. -ENT consultation pending for further evaluation and management.  # Hypertension Elevated home blood pressure readings, despite being on Diltiazem 180mg  daily. -Restart Valsartan 160mg  daily and HCTZ 25mg  daily -Check blood pressure daily and record readings. -Repeat CMP in 1 week to assess kidney function, liver function, and electrolytes.  # Paroxysaml Atrial Fibrillation On Eliquis and Diltiazem for rate control and stroke prevention. -Continue Eliquis and Diltiazem. -Check for Eliquis samples due to cost concerns.  # Sedentary Lifestyle Limited physical activity due to fear of inducing AFib and fatigue. -Encouraged to increase physical activity, including dancing at home for 20 minutes daily.  # Nocturia Frequent nighttime urination, disrupting sleep. -Consider evaluation by primary care physician or urologist for potential overactive bladder.   #Non-obstructive CAD  # Hyperlipidemia Due for cholesterol check. -Perform fasting labs to check cholesterol levels.  Follow-up in 2 months to reassess blood pressure control and overall health status.      Screening for Secondary Hypertension:     11/01/2021   10:31 AM  Causes  Drugs/Herbals Screened     - Comments  Limits salt and caffeine.  No EtOH.  Rare NSAID use.  Renovascular HTN Screened     - Comments Check renal artery Dopplers  Sleep Apnea Screened     - Comments Snoring and orthopnea.  No other  symptoms.  We will get an echo and if it is unremarkable consider screening for sleep study.  Thyroid Disease Screened     - Comments Check TSH  Hyperaldosteronism Not Screened     - Comments Currently on ARB.  Therefore will not start at this time.  Pheochromocytoma Screened     - Comments No symptoms  Cushing's Syndrome Not Screened  Hyperparathyroidism Screened  Coarctation of the Aorta Screened     - Comments BP symmetric  Compliance Screened    Relevant Labs/Studies:    Latest Ref Rng & Units 08/16/2023    4:03 AM 08/15/2023    3:43 AM 08/14/2023    4:01 AM  Basic Labs  Sodium 135 - 145 mmol/L 136  131  137   Potassium 3.5 - 5.1 mmol/L 4.5  4.5  4.4   Creatinine 0.44 - 1.00 mg/dL 1.61  0.96  0.45        Latest Ref Rng & Units 08/13/2023    1:28 PM 02/24/2023   12:36 PM  Thyroid   TSH 0.350 - 4.500 uIU/mL 3.369  1.996     Disposition:    FU with Rachel Headrick C. Duke Salvia, MD, [facility] in 2 months.  Medication Adjustments/Labs and Tests Ordered: Current medicines are reviewed at length with the patient today.  Concerns regarding medicines are outlined above.   Orders Placed This Encounter  Procedures   Lipid panel   Comprehensive metabolic panel   Meds ordered this encounter  Medications   valsartan-hydrochlorothiazide (DIOVAN HCT) 160-25 MG tablet    Sig: Take 1 tablet by mouth daily.    Dispense:  90 tablet    Refill:  3   apixaban (ELIQUIS) 5 MG TABS tablet    Sig: Take 1 tablet (5 mg total) by mouth 2 (two) times daily for 14 days.    Lot Number?:   [id number]    Expiration Date?:   09/24/2024    Signed, Chilton Si, MD  11/18/2023 12:57 PM    [facility]

## 2023-11-20 ENCOUNTER — Telehealth (INDEPENDENT_AMBULATORY_CARE_PROVIDER_SITE_OTHER): Payer: Self-pay | Admitting: Otolaryngology

## 2023-11-20 NOTE — Telephone Encounter (Signed)
 Reminder Call: Date: 11/21/2023 Status: Sch  Time: 2:50 PM [address] [place], [place] [address]  Left voicemail w/time and location

## 2023-11-21 ENCOUNTER — Institutional Professional Consult (permissible substitution) (INDEPENDENT_AMBULATORY_CARE_PROVIDER_SITE_OTHER): Payer: PPO

## 2023-11-22 ENCOUNTER — Ambulatory Visit (INDEPENDENT_AMBULATORY_CARE_PROVIDER_SITE_OTHER): Payer: PPO | Admitting: Otolaryngology

## 2023-11-22 ENCOUNTER — Institutional Professional Consult (permissible substitution) (INDEPENDENT_AMBULATORY_CARE_PROVIDER_SITE_OTHER): Payer: PPO

## 2023-11-22 ENCOUNTER — Encounter (INDEPENDENT_AMBULATORY_CARE_PROVIDER_SITE_OTHER): Payer: Self-pay | Admitting: Otolaryngology

## 2023-11-22 VITALS — BP 179/93 | HR 69 | Ht 63.0 in | Wt 198.0 lb

## 2023-11-22 DIAGNOSIS — H9201 Otalgia, right ear: Secondary | ICD-10-CM | POA: Diagnosis not present

## 2023-11-22 DIAGNOSIS — I10 Essential (primary) hypertension: Secondary | ICD-10-CM | POA: Diagnosis not present

## 2023-11-22 DIAGNOSIS — R040 Epistaxis: Secondary | ICD-10-CM

## 2023-11-22 DIAGNOSIS — M26621 Arthralgia of right temporomandibular joint: Secondary | ICD-10-CM

## 2023-11-22 MED ORDER — OXYMETAZOLINE HCL 0.05 % NA SOLN: 1.0000 | Freq: Two times a day (BID) | NASAL | 0 refills | Status: AC | PRN

## 2023-11-22 MED ORDER — SALINE SPRAY 0.65 % NA SOLN: 1.0000 | Freq: Four times a day (QID) | NASAL | 0 refills | Status: AC

## 2023-11-22 NOTE — Patient Instructions (Addendum)
 Epistaxis prevention instructions given to the patient: - use nasal saline spray x6/day and Vaseline twice a day to prevent nose bleeds - for active nose bleeds use Afrin and nasal tip pressure x 10 min to stop them - if nose bleed does not stop with above measures, please go to Emergency Room  - please see your primary care provider to check your blood pressure and make sure it is under control - return for recurrent nose bleeds and we will consider cautery of your nasal blood vessels  - Purchase BleedStop to have at home and help with epistaxis control    TMJ (Temporomandibular Joint Syndrome) The temporomandibular (tem-puh-[id number]-man-DIB-u-lur) joint (TMJ) acts like a sliding hinge, connecting your jawbone to your skull. You have one joint on each side of your jaw. TMJ disorders -- a type of temporomandibular disorder or TMD -- can cause pain in your jaw joint and in the muscles that control jaw movement.  The exact cause of a person's TMJ disorder is often difficult to determine. Your pain may be due to a combination of factors, such as genetics, arthritis or jaw injury. Some people who have jaw pain also tend to clench or grind their teeth (bruxism), although many people habitually clench or grind their teeth and never develop TMJ disorders.  In most cases, the pain and discomfort associated with TMJ disorders is temporary and can be relieved with self-managed care or nonsurgical treatments. This includes stress reduction, softer diet when the pain is present, anti-inflammatory pain medications such as Motrin and warm compresses.

## 2023-11-22 NOTE — Progress Notes (Signed)
 ENT CONSULT:  Reason for Consult: right epistaxis and ear pain   HPI:  Discussed the use of AI scribe software for clinical note transcription with the patient, who gave verbal consent to proceed.  History of Present Illness   Rachel [name] is a 83 year old female with atrial fibrillation/on Eliquis who presents with recurrent right-sided epistaxis and right ear pain/otalgia. Record review indicates that these issues are chronic and she was previously diagnosed with R TMJ sy by Dr Jenne Pane.  She experiences recurrent right-sided epistaxis approximately once a week for the past six weeks. The bleeding is exclusively from the right nostril and often drains down the back of her throat, causing choking and clotting in her mouth. Each episode lasts about 15 to 20 minutes and resolves spontaneously. She is not using any nasal sprays to manage nasal dryness.  She reports right ear pain, particularly intense at night, which sometimes causes her to sleep with her finger in her ear. The pain is described as a 'drilling' sensation and is sometimes accompanied by swelling along the masseter/angle of the jaw. This has been a recurring issue for several years and has been suggested to be related to temporomandibular joint (TMJ) dysfunction. She uses Tylenol for pain relief and occasionally applies a heating pad, which provides temporary relief.  Her past medical history includes atrial fibrillation, for which she is on Eliquis, and hypertension. She mentions a recent high blood pressure reading during a cardiologist visit, where an additional antihypertensive medication was prescribed but not yet picked up.       Records Reviewed:  Seen by Dr Jenne Pane for R TMJ - 04/11/2023 Seen by Dr Aleene Davidson for right otalgia 06/22/21    Past Medical History:  Diagnosis Date   (HFpEF) heart failure with preserved ejection fraction (HCC) 01/17/2023   Allergic rhinitis    CAD in native artery 07/17/2023    Diabetes mellitus, type 2 (HCC)    Hyperkalemia 08/14/2023   Hyperlipemia    Hypertension    Left shoulder pain 11/01/2021   Lipoma NEC    anterior upper chest   Osteoarthritis of knee    PAF (paroxysmal atrial fibrillation) (HCC) 02/24/2023   Shortness of breath 11/01/2021    Past Surgical History:  Procedure Laterality Date   ACNE CYST REMOVAL  1970   back   APPENDECTOMY  1963   CARDIOVERSION N/A 08/16/2023   Procedure: CARDIOVERSION (CATH LAB);  Surgeon: Sande Rives, MD;  Location[facility] INVASIVE CV LAB;  Service: Cardiovascular;  Laterality: N/A;   LEFT HEART CATH AND CORONARY ANGIOGRAPHY N/A 02/25/2023   Procedure: LEFT HEART CATH AND CORONARY ANGIOGRAPHY;  Surgeon: Kathleene Hazel, MD;  Location: [facility] INVASIVE CV LAB;  Service: Cardiovascular;  Laterality: N/A;   OOPHORECTOMY  1985   TRANSESOPHAGEAL ECHOCARDIOGRAM (CATH LAB) N/A 08/16/2023   Procedure: TRANSESOPHAGEAL ECHOCARDIOGRAM;  Surgeon: Sande Rives, MD;  Location: [facility] INVASIVE CV LAB;  Service: Cardiovascular;  Laterality: N/A;   VAGINAL HYSTERECTOMY  1985   complete    Family History  Problem Relation Age of Onset   Diabetes Mother    Heart attack Mother    Cancer Father        brain   Diabetes Sister    Hypertension Sister    Diabetes Brother    Hypertension Brother    Coronary artery disease Brother    Aneurysm Other        niece    Social History:  reports that she has never  smoked. She has never been exposed to tobacco smoke. She has never used smokeless tobacco. She reports current alcohol use of about 1.0 standard drink of alcohol per week. She reports that she does not use drugs.  Allergies:  Allergies  Allergen Reactions   Atorvastatin Other (See Comments)    Caused aching in the body   Glimepiride Diarrhea   Metformin Diarrhea    Medications: I have reviewed the patient's current medications.   The PMH, PSH, Medications, Allergies, and SH were reviewed and  updated.  ROS: Constitutional: Negative for fever, weight loss and weight gain. Cardiovascular: Negative for chest pain and dyspnea on exertion. Respiratory: Is not experiencing shortness of breath at rest. Gastrointestinal: Negative for nausea and vomiting. Neurological: Negative for headaches. Psychiatric: The patient is not nervous/anxious  Blood pressure (!) 179/93, pulse 69, height 5\' 3"  (1.6 m), weight 198 lb (89.8 kg), SpO2 99%.  PHYSICAL EXAM:  Exam: General: Well-developed, well-nourished Respiratory Respiratory effort: Equal inspiration and expiration without stridor Cardiovascular Peripheral Vascular: Warm extremities with equal color/perfusion Eyes: No nystagmus with equal extraocular motion bilaterally Neuro/Psych/Balance: Patient oriented to person, place, and time; Appropriate mood and affect; Gait is intact with no imbalance; Cranial nerves I-XII are intact Head and Face Inspection: Normocephalic and atraumatic without mass or lesion Palpation: Facial skeleton intact without bony stepoffs Salivary Glands: No mass or tenderness Facial Strength: Facial motility symmetric and full bilaterally ENT Pinna: External ear intact and fully developed External canal: Canal is patent with intact skin Tympanic Membrane: Clear and mobile External Nose: No scar or anatomic deformity Internal Nose: Septum is S-shaped. No polyp, or purulence. Mucosal edema and erythema present. No epistaxis or clot Bilateral inferior turbinate hypertrophy.  Right anterior septum and anterior inferior turbinate with prominent blood vessels, cauterized - see procedure note  Lips, Teeth, and gums: Mucosa and teeth intact and viable TMJ: No pain to palpation with full mobility Oral cavity/oropharynx: No erythema or exudate, no lesions present Nasopharynx: No mass or lesion with intact mucosa Neck Neck and Trachea: Midline trachea without mass or lesion Thyroid: No mass or nodularity Lymphatics: No  lymphadenopathy  Procedure:   PROCEDURE NOTE: nasal endoscopy  Preoperative diagnosis: recurrent epistaxis right side   Postoperative diagnosis: same  Procedure: Diagnostic nasal endoscopy (01027)  Surgeon: Ashok Croon, M.D.  Anesthesia: Topical lidocaine and Afrin  H&P REVIEW: The patient's history and physical were reviewed today prior to procedure. All medications were reviewed and updated as well. Complications: None Condition is stable throughout exam Indications and consent: The patient presents with symptoms of chronic sinusitis not responding to previous therapies. All the risks, benefits, and potential complications were reviewed with the patient preoperatively and informed consent was obtained. The time out was completed with confirmation of the correct procedure.   Procedure: The patient was seated upright in the clinic. Topical lidocaine and Afrin were applied to the nasal cavity. After adequate anesthesia had occurred, the rigid nasal endoscope was passed into the nasal cavity. The nasal mucosa, turbinates, septum, and sinus drainage pathways were visualized bilaterally. This revealed no purulence or significant secretions that might be cultured. There were no polyps or sites of significant inflammation. The mucosa was intact and there was no crusting present. The scope was then slowly withdrawn and the patient tolerated the procedure well. There were no complications or blood loss.  PROCEDURE NOTE: nasal cautery (CPT [phone]) Preoperative diagnosis: epistaxis right sided  Postoperative diagnosis: same Procedure: nasal cautery, anterior Surgeon: Ashok Croon Anesthesia: Topical lidocaine  and Afrin Complications: None Condition is stable throughout exam Indications and consent: The patient presented with symptoms of recurrent epistaxis, not responding to previous therapies. All the risks, benefits, and potential complications were reviewed with the patient  preoperatively and informed consent was obtained. The time out was completed with confirmation of the correct procedure.    Procedure: The patient was seated upright in the clinic. Topical lidocaine and Afrin were applied to the nasal cavity. After adequate anesthesia had occurred, the silver nitrate stick was used to chemically cauterize the superficial vessels on the right side of the septum anteriorly. Bleeding was controlled at this point. the patient tolerated the procedure well. There were no complications or blood loss.   Assessment/Plan: Encounter Diagnoses  Name Primary?   Epistaxis Yes   Otalgia of right ear    Arthralgia of right temporomandibular joint     Assessment and Plan    Epistaxis Right side  Recurrent right-sided epistaxis, likely exacerbated by Eliquis. Examination revealed prominent blood vessels R anterior septum and IT, likely source of bleeding. Discussed silver nitrate application, including risks (temporary discomfort, potential recurrence), benefits (reduced bleeding), and alternatives (conservative management with saline sprays). She elected to proceed. We cauterized the right anterior septum and IT -  use saline spray six times daily - Prescribe Afrin for use during active epistaxis - Advised application of antibiotic ointment x 1 week then Vaseline to nostrils twice daily  - Instruct to avoid manipulating or rubbing the inside of the nose - Provide written home care instructions for epistaxis prevention   Temporomandibular Joint (TMJ) Disorder/R otalgia  Chronic right ear pain likely related to TMJ disorder, exacerbated at night possibly due to teeth clenching. Ear exam is unremarkable. History of denture use may contribute. Discussed chronic nature and symptom management with Tylenol, warm compresses, and a softer diet. - Recommend continuation of Tylenol for pain management - Advise use of warm compresses for pain relief - Suggest a softer diet to minimize  chewing and reduce TMJ strain - Provide anticipatory guidance on chronic nature and symptom management - see dentistry if sx persist   Atrial Fibrillation Atrial fibrillation managed with Eliquis. No new symptoms or changes in management discussed. - Continue current management with Eliquis  Hypertension Reports hypertension with a recent reading of 170/93 mmHg. Recently seen by a cardiologist and prescribed an additional antihypertensive medication. Expressed reluctance but agreed to start the new medication after today's high reading. - Advised to pick up and start the new antihypertensive medication as prescribed by the cardiologist.     Thank you for allowing me to participate in the care of this patient. Please do not hesitate to contact me with any questions or concerns.   Ashok Croon, MD Otolaryngology [facility] Phone: [phone] Fax: [phone]    11/22/2023, 12:03 PM

## 2023-11-25 ENCOUNTER — Ambulatory Visit: Payer: Self-pay | Admitting: Licensed Clinical Social Worker

## 2023-11-25 NOTE — Patient Instructions (Signed)
 Visit Information  Thank you for taking time to visit with me today. Please don't hesitate to contact me if I can be of assistance to you.   Following are the goals we discussed today:   Goals Addressed             This Visit's Progress    COMPLETED: Care Coordination Activities       Care Coordination Interventions: Patient stated that she is currently on social security and receives $1700 per month and on a fixed income and after paying her bills and house payment that she does buy groceries. She has never applied for food stamps because she felt that she made to much. She agreed to allow the SW to send her a paper copy of the SNAP/Food Stamp application. SW also educated the patient on community food pantry's and agreed to allow the SW to send a list of food pantry's. Patient stated that her Gas bill was higher than usual and it was over $300 and she is the only one in the home. SW educated the patient on calling the gas company and making an arrangement if needed. SW also will mail a list of utility resources if needed in the future.  SW asked other SDOH questions and no other needs at this time SW will follow up on 11/08/2023 at 11:30 am        No further follow up needed, SW encouraged patient to contact PCP if she needs to schedule with SW  Please call the care guide team at [phone] if you need to cancel or reschedule your appointment.   If you are experiencing a Mental Health or Behavioral Health Crisis or need someone to talk to, please call the Suicide and Crisis Lifeline: 988 go to [facility] [address], [place] [phone]) call 911  Patient verbalizes understanding of instructions and care plan provided today and agrees to view in MyChart. Active MyChart status and patient understanding of how to access instructions and care plan via MyChart confirmed with patient.     Jeanie Cooks, PhD [facility], [facility] Social Worker Direct Dial: [phone]  Fax: [phone]

## 2023-11-25 NOTE — Patient Outreach (Signed)
 Care Coordination   Follow Up Visit Note   11/25/2023 Name: Rachel [name] MRN: [id number] DOB: August 09, 1941  Rachel [name] is a 83 y.o. year old female who sees Rema Fendt, NP for primary care. I spoke with  [name] by phone today.  What matters to the patients health and wellness today?  Patient received the resources for Food insecurities and utility assistance     Goals Addressed             This Visit's Progress    COMPLETED: Care Coordination Activities       Care Coordination Interventions: Patient stated that she is currently on social security and receives $1700 per month and on a fixed income and after paying her bills and house payment that she does buy groceries. She has never applied for food stamps because she felt that she made to much. She agreed to allow the SW to send her a paper copy of the SNAP/Food Stamp application. SW also educated the patient on community food pantry's and agreed to allow the SW to send a list of food pantry's. Patient stated that her Gas bill was higher than usual and it was over $300 and she is the only one in the home. SW educated the patient on calling the gas company and making an arrangement if needed. SW also will mail a list of utility resources if needed in the future.  SW asked other SDOH questions and no other needs at this time SW will follow up on 11/08/2023 at 11:30 am        SDOH assessments and interventions completed:  Yes  SDOH Interventions Today    Flowsheet Row Most Recent Value  SDOH Interventions   Food Insecurity Interventions Intervention Not Indicated  Housing Interventions Intervention Not Indicated  Transportation Interventions Intervention Not Indicated  Utilities Interventions Intervention Not Indicated        Care Coordination Interventions:  Yes, provided  Interventions Today    Flowsheet Row Most Recent Value  General Interventions   General  Interventions Discussed/Reviewed General Interventions Reviewed, Science writer received the reosurces and was able to use them]        Follow up plan: No further intervention required.   Encounter Outcome:  Patient Visit Completed  Jeanie Cooks, PhD [facility], [facility] Social Worker Direct Dial: [phone]  Fax: [phone]

## 2023-11-28 ENCOUNTER — Telehealth: Payer: Self-pay

## 2023-11-28 NOTE — Telephone Encounter (Signed)
 LVM for patient to call back. ?

## 2023-11-28 NOTE — Telephone Encounter (Signed)
 Pt called and lvm stating she wants a call back regarding her appointment scheduled for 3/27.

## 2023-12-02 ENCOUNTER — Other Ambulatory Visit: Payer: Self-pay | Admitting: Family

## 2023-12-02 DIAGNOSIS — E1165 Type 2 diabetes mellitus with hyperglycemia: Secondary | ICD-10-CM

## 2023-12-03 DIAGNOSIS — Z5181 Encounter for therapeutic drug level monitoring: Secondary | ICD-10-CM | POA: Diagnosis not present

## 2023-12-03 DIAGNOSIS — I10 Essential (primary) hypertension: Secondary | ICD-10-CM | POA: Diagnosis not present

## 2023-12-03 DIAGNOSIS — E7800 Pure hypercholesterolemia, unspecified: Secondary | ICD-10-CM | POA: Diagnosis not present

## 2023-12-04 LAB — COMPREHENSIVE METABOLIC PANEL
ALT: 11 IU/L (ref 0–32)
AST: 14 IU/L (ref 0–40)
Albumin: 4.2 g/dL (ref 3.8–4.8)
Alkaline Phosphatase: 78 IU/L (ref 44–121)
BUN/Creatinine Ratio: 29 — ABNORMAL HIGH (ref 12–28)
BUN: 35 mg/dL — ABNORMAL HIGH (ref 8–27)
Bilirubin Total: 0.3 mg/dL (ref 0.0–1.2)
CO2: 21 mmol/L (ref 20–29)
Calcium: 9.8 mg/dL (ref 8.7–10.3)
Chloride: 102 mmol/L (ref 96–106)
Creatinine, Ser: 1.22 mg/dL — ABNORMAL HIGH (ref 0.57–1.00)
Globulin, Total: 3.5 g/dL (ref 1.5–4.5)
Glucose: 210 mg/dL — ABNORMAL HIGH (ref 70–99)
Potassium: 5.4 mmol/L — ABNORMAL HIGH (ref 3.5–5.2)
Sodium: 138 mmol/L (ref 134–144)
Total Protein: 7.7 g/dL (ref 6.0–8.5)
eGFR: 45 mL/min/{1.73_m2} — ABNORMAL LOW (ref >59)

## 2023-12-04 LAB — LIPID PANEL
Chol/HDL Ratio: 3.3 ratio (ref 0.0–4.4)
Cholesterol, Total: 169 mg/dL (ref 100–199)
HDL: 51 mg/dL (ref >39)
LDL Chol Calc (NIH): 95 mg/dL (ref 0–99)
Triglycerides: 127 mg/dL (ref 0–149)
VLDL Cholesterol Cal: 23 mg/dL (ref 5–40)

## 2023-12-09 ENCOUNTER — Encounter (HOSPITAL_BASED_OUTPATIENT_CLINIC_OR_DEPARTMENT_OTHER): Payer: Self-pay | Admitting: *Deleted

## 2023-12-10 DIAGNOSIS — J45909 Unspecified asthma, uncomplicated: Secondary | ICD-10-CM | POA: Diagnosis not present

## 2023-12-11 ENCOUNTER — Other Ambulatory Visit: Payer: Self-pay | Admitting: Family

## 2023-12-11 DIAGNOSIS — R059 Cough, unspecified: Secondary | ICD-10-CM

## 2023-12-16 ENCOUNTER — Telehealth: Payer: Self-pay | Admitting: Cardiovascular Disease

## 2023-12-16 DIAGNOSIS — E785 Hyperlipidemia, unspecified: Secondary | ICD-10-CM

## 2023-12-16 DIAGNOSIS — I10 Essential (primary) hypertension: Secondary | ICD-10-CM

## 2023-12-16 DIAGNOSIS — D6859 Other primary thrombophilia: Secondary | ICD-10-CM

## 2023-12-16 DIAGNOSIS — I480 Paroxysmal atrial fibrillation: Secondary | ICD-10-CM

## 2023-12-16 DIAGNOSIS — I25118 Atherosclerotic heart disease of native coronary artery with other forms of angina pectoris: Secondary | ICD-10-CM

## 2023-12-16 DIAGNOSIS — R040 Epistaxis: Secondary | ICD-10-CM

## 2023-12-16 MED ORDER — ROSUVASTATIN CALCIUM 10 MG PO TABS: 10.0000 mg | ORAL_TABLET | Freq: Every day | ORAL | 3 refills | Status: DC

## 2023-12-16 NOTE — Telephone Encounter (Signed)
 Results called to patient who verbalizes understanding!   Rx to pharmacy, labs ordered.

## 2023-12-16 NOTE — Telephone Encounter (Signed)
Pt returning nurses call from Friday regarding test results. Please advise

## 2023-12-19 ENCOUNTER — Ambulatory Visit: Payer: PPO | Admitting: Endocrinology

## 2023-12-23 ENCOUNTER — Telehealth: Payer: Self-pay | Admitting: Cardiovascular Disease

## 2023-12-23 ENCOUNTER — Encounter (HOSPITAL_BASED_OUTPATIENT_CLINIC_OR_DEPARTMENT_OTHER): Payer: Self-pay

## 2023-12-23 NOTE — Telephone Encounter (Signed)
 Spoke with patient regarding appointment tomorrow.  Was hoping to reschedule since daughter out of the office Explained to patient Dr Duke Salvia didn't have anything soon.  She will keep follow up as scheduled

## 2023-12-23 NOTE — Progress Notes (Deleted)
 Advanced [facility]pertension [facility] Follow-up:    Date:  12/23/2023   ID:  Tameko, Halder 07-Nov-1940, MRN [id number]  PCP:  Rema Fendt, NP  Cardiologist:  Chilton Si, MD   Referring MD: Rema Fendt, NP   CC: Hypertension  History of Present Illness:    Rachel [name] is a 83 y.o. female with a hx of nonobstructive CAD, atrial fibrillation, hypertension, asthma, diabetes, here for follow-up. She was initially seen 11/01/2021 in the [facility]. She had a televisit 06/2021 and her blood pressure was 157/87 on amlodipine, losartan, and HCTZ. Amlodipine was increased to 10 mg. She previously had an Echo in 2019 which showed LVEF 65-70% and grade 1 diastolic dysfunction.  At her initial visit, she reported home blood pressures averaging 160-187 systolic prior to taking her medications. She had mildly elevated neck veins on exam. She had no symptoms of pheochromocytoma. Given that she was on ARB we did not check for hyperaldosteronism. We restarted her on 5 mg amlodipine daily (previously intolerant of 10 mg dose but was stable on 5 mg). Losartan/HCTZ was switched to valsartan/HCTZ 320/25 mg daily. She was also started on spironolactone 25 mg daily. She was referred to PREP. Renal artery doppler and echocardiogram were ordered but not completed. Later she reported significant LE swelling, so amlodipine was reduced from 5 mg to 2.5 mg daily. On 01/13/2023 she presented to the ED with complaints of worsening DOE for several days in the setting of months of leg swelling. Noted to be hypertensive in triage but otherwise without distress. BNP was normal. She was discharged on 20 mg furosemide BID.    She was seen 12/2022 and her home blood pressures were averaging 165-167 systolic. This was similar to her BP in the office. There had been some confusion with her medications and some had been lost from her regime. We discontinued hospital-prescribed diuretics  and restarted amlodipine, spironolactone, and valsartan/HCTZ. She had been hesitant to refill her atorvastatin after hearing about potential side effects. She was encouraged to restart her cholesterol medication.  From 6/2-02/27/2023 she was admitted after presenting with syncope and new onset atrial fibrillation with RVR. She also complained of chest pain and troponins were elevated, felt probably due to demand ischemia. She underwent left heart catheterization 02/25/23 showing mild nonobstructive CAD (prox RCA 20%, ost-proxCx 20%, ost-prox LAD 20%, mid LAD 20%). Echo during admission showed LVEF 65 to 70%, no RWMA, normal RV function, no significant valvular abnormalities. She reverted to sinus bradycardia with PO diltiazem and Toprol prior to discharge. Spironolactone had been reduced and amlodipine was held. She followed up with Gillian Shields, NP 03/08/23 and noted home blood pressures in systolic 160s-170s although she was checking prior to taking her medications. In the office her BP was 136/86. She was maintaining sinus bradycardia and denied palpitations. Noted to be intolerant of atorvastatin 20 mg and she was switched to 5 mg rosuvastatin daily.  At her visit 06/2023 she noted feeling fatigued and having increased exertional dyspnea.  Blood pressures were uncontrolled.  She had an episode of syncope at which time her blood pressure was almost 200 systolic.  Spironolactone was increased.  She was referred to the PREP program at the [facility].  She was admitted to the hospital 07/2023 with atrial flutter with RVR and syncope.  She was transition to diltiazem.  She underwent TEE/DCCV given that she had missed a dose of Eliquis.  Interestingly, blood pressures were well-controlled in the hospital on  diltiazem only despite having uncontrolled blood pressures at home.  Valsartan/HCT was added back prior to discharge from the hospital.  She was seen in A-fib clinic 07/2023 and remained in sinus rhythm.  She noticed  ankle swelling since starting diltiazem.  They discussed rhythm control options.  She was given Lasix and has not yet followed up with them.  At her visit 10/2023 BP was int he 170s-180s.  Valsartan was restarted.         Previous antihypertensives: Amlodipine-increased edema at higher doses  Past Medical History:  Diagnosis Date   (HFpEF) heart failure with preserved ejection fraction (HCC) 01/17/2023   Allergic rhinitis    CAD in native artery 07/17/2023   Diabetes mellitus, type 2 (HCC)    Hyperkalemia 08/14/2023   Hyperlipemia    Hypertension    Left shoulder pain 11/01/2021   Lipoma NEC    anterior upper chest   Osteoarthritis of knee    PAF (paroxysmal atrial fibrillation) (HCC) 02/24/2023   Shortness of breath 11/01/2021    Past Surgical History:  Procedure Laterality Date   ACNE CYST REMOVAL  1970   back   APPENDECTOMY  1963   CARDIOVERSION N/A 08/16/2023   Procedure: CARDIOVERSION (CATH LAB);  Surgeon: Sande Rives, MD;  Location: [facility] INVASIVE CV LAB;  Service: Cardiovascular;  Laterality: N/A;   LEFT HEART CATH AND CORONARY ANGIOGRAPHY N/A 02/25/2023   Procedure: LEFT HEART CATH AND CORONARY ANGIOGRAPHY;  Surgeon: Kathleene Hazel, MD;  Location: [facility] INVASIVE CV LAB;  Service: Cardiovascular;  Laterality: N/A;   OOPHORECTOMY  1985   TRANSESOPHAGEAL ECHOCARDIOGRAM (CATH LAB) N/A 08/16/2023   Procedure: TRANSESOPHAGEAL ECHOCARDIOGRAM;  Surgeon: Sande Rives, MD;  Location: [facility] INVASIVE CV LAB;  Service: Cardiovascular;  Laterality: N/A;   VAGINAL HYSTERECTOMY  1985   complete    Current Medications: No outpatient medications have been marked as taking for the 12/24/23 encounter (Appointment) with Chilton Si, MD.     Allergies:   Atorvastatin, Glimepiride, and Metformin   Social History   Socioeconomic History   Marital status: Widowed    Spouse name: Not on file   Number of children: Not on file   Years of education: Not on file    Highest education level: Not on file  Occupational History   Occupation: department mang    Employer: [employer]    Comment: Retired  Tobacco Use   Smoking status: Never    Passive exposure: Never   Smokeless tobacco: Never   Tobacco comments:    Never smoked 08/21/23  Vaping Use   Vaping status: Never Used  Substance and Sexual Activity   Alcohol use: Yes    Alcohol/week: 1.0 standard drink of alcohol    Types: 1 Glasses of wine per week    Comment: ocassionally   Drug use: No   Sexual activity: Not Currently  Other Topics Concern   Not on file  Social History Narrative   No regular exercise   Widowed   Social Drivers of Health   Financial Resource Strain: High Risk (09/05/2023)   Overall Financial Resource Strain (CARDIA)    Difficulty of Paying Living Expenses: Hard  Food Insecurity: No Food Insecurity (11/25/2023)   Hunger Vital Sign    Worried About Running Out of Food in the Last Year: Never true    Ran Out of Food in the Last Year: Never true  Recent Concern: Food Insecurity - Food Insecurity Present (10/24/2023)   Hunger Vital Sign  Worried About Programme researcher, broadcasting/film/video in the Last Year: Sometimes true    [employer] of Food in the Last Year: Sometimes true  Transportation Needs: No Transportation Needs (11/25/2023)   PRAPARE - Administrator, Civil Service (Medical): No    Lack of Transportation (Non-Medical): No  Physical Activity: Inactive (09/05/2023)   Exercise Vital Sign    Days of Exercise per Week: 0 days    Minutes of Exercise per Session: 0 min  Stress: Not on file  Social Connections: Not on file     Family History: The patient's family history includes Aneurysm in an other family member; Cancer in her father; Coronary artery disease in her brother; Diabetes in her brother, mother, and sister; Heart attack in her mother; Hypertension in her brother and sister.  ROS:   Please see the history of present illness.    (+) Fatigue (+)  Occasional palpitations (+) Soreness of right hand All other systems reviewed and are negative.  EKGs/Labs/Other Studies Reviewed:    Left Heart Cath  02/25/2023:   Prox RCA lesion is 20% stenosed.   Ost Cx to Prox Cx lesion is 20% stenosed.   Ost LAD to Prox LAD lesion is 20% stenosed.   Mid LAD lesion is 20% stenosed.   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 Her troponin elevation is likely due to demand ischemia in the setting of atrial fib with RVR.   Echo  02/25/2023:  1. Left ventricular ejection fraction, by estimation, is 65 to 70%. The  left ventricle has normal function. The left ventricle has no regional  wall motion abnormalities. There is mild concentric left ventricular  hypertrophy. Left ventricular diastolic  function could not be evaluated.   2. Right ventricular systolic function is normal. The right ventricular  size is normal.   3. Left atrial size was mildly dilated.   4. The mitral valve is normal in structure. Trivial mitral valve  regurgitation. No evidence of mitral stenosis.   5. The aortic valve is normal in structure. Aortic valve regurgitation is  not visualized. No aortic stenosis is present.   6. The inferior vena cava is normal in size with greater than 50%  respiratory variability, suggesting right atrial pressure of 3 mmHg.   Chest X-Ray  01/13/2023: IMPRESSION: 1. Mild cardiomegaly and increased central vascular prominence without overt edema. 2. No other evidence for acute chest process. Linear scarring or atelectasis left mid field.  Echo 01/09/2018: Study Conclusions  - Left ventricle: Wall thickness was increased in a pattern of    moderate LVH. Systolic function was vigorous. The estimated    ejection fraction was in the range of 65% to 70%. Wall motion was    normal; there were no regional wall motion abnormalities. Doppler    parameters are consistent with abnormal left  ventricular    relaxation (grade 1 diastolic dysfunction).  - Aortic valve: Peak gradient 18 mmHg across the aortic valve    likely represents LV outflow tract gradient rather than valvular    AS, the valve appears to open well. Peak gradient (S): 18 mm Hg.  - Mitral valve: There was mild regurgitation.  - Right ventricle: The cavity size was normal. Systolic function    was normal.  - Tricuspid valve: Peak RV-RA gradient (S): 37 mm Hg.  - Pulmonary arteries: PA peak pressure: 40 mm Hg (S).  - Inferior vena cava: The vessel was  normal in size. The    respirophasic diameter changes were in the normal range (>= 50%),    consistent with normal central venous pressure.   Impressions:  - Normal LV size with moderate LV hypertrophy. EF 65-70%, vigorous    systolic function. Normal RV size and systolic function. Mild    gradient across the LV outflow tract is likely due to    LVH/vigorous LV systolic function rather than valvular AS.   EKG:  EKG is personally reviewed. 07/17/2023:  Not ordered. 01/17/2023:  EKG was not ordered. 11/01/2021: Sinus rhythm. Rate 66 bpm.  Recent Labs: 08/13/2023: B Natriuretic Peptide 171.2; Magnesium 1.8; TSH 3.369 08/14/2023: Hemoglobin 11.5; Platelets 301 12/03/2023: ALT 11; BUN 35; Creatinine, Ser 1.22; Potassium 5.4; Sodium 138   Recent Lipid Panel    Component Value Date/Time   CHOL 169 12/03/2023 1305   TRIG 127 12/03/2023 1305   HDL 51 12/03/2023 1305   CHOLHDL 3.3 12/03/2023 1305   CHOLHDL 5 01/12/2020 1350   VLDL 45.2 (H) 01/12/2020 1350   LDLCALC 95 12/03/2023 1305   LDLDIRECT 139.0 01/12/2020 1350    Physical Exam:    VS:  There were no vitals taken for this visit. , BMI There is no height or weight on file to calculate BMI. GENERAL:  Well appearing HEENT: Pupils equal round and reactive, fundi not visualized, oral mucosa unremarkable NECK: No JVP.  Waveform within normal limits, carotid upstroke brisk and symmetric, no bruits, no  thyromegaly LUNGS:  Clear to auscultation bilaterally HEART:  RRR.  PMI not displaced or sustained,S1 and S2 within normal limits, no S3, no S4, no clicks, no rubs, no murmurs ABD:  Flat, positive bowel sounds normal in frequency in pitch, no bruits, no rebound, no guarding, no midline pulsatile mass, no hepatomegaly, no splenomegaly EXT:  2 plus pulses throughout, no LE edema bilaterally, no cyanosis no clubbing SKIN:  No rashes no nodules NEURO:  Cranial nerves II through XII grossly intact, motor grossly intact throughout PSYCH:  Cognitively intact, oriented to person place and time   ASSESSMENT/PLAN:           Screening for Secondary Hypertension:     11/01/2021   10:31 AM  Causes  Drugs/Herbals Screened     - Comments Limits salt and caffeine.  No EtOH.  Rare NSAID use.  Renovascular HTN Screened     - Comments Check renal artery Dopplers  Sleep Apnea Screened     - Comments Snoring and orthopnea.  No other symptoms.  We will get an echo and if it is unremarkable consider screening for sleep study.  Thyroid Disease Screened     - Comments Check TSH  Hyperaldosteronism Not Screened     - Comments Currently on ARB.  Therefore will not start at this time.  Pheochromocytoma Screened     - Comments No symptoms  Cushing's Syndrome Not Screened  Hyperparathyroidism Screened  Coarctation of the Aorta Screened     - Comments BP symmetric  Compliance Screened    Relevant Labs/Studies:    Latest Ref Rng & Units 12/03/2023    1:05 PM 08/16/2023    4:03 AM 08/15/2023    3:43 AM  Basic Labs  Sodium 134 - 144 mmol/L 138  136  131   Potassium 3.5 - 5.2 mmol/L 5.4  4.5  4.5   Creatinine 0.57 - 1.00 mg/dL 1.61  0.96  0.45        Latest Ref Rng & Units 08/13/2023  1:28 PM 02/24/2023   12:36 PM  Thyroid   TSH 0.350 - 4.500 uIU/mL 3.369  1.996     Disposition:    FU with Airyana Sprunger C. Duke Salvia, MD, [facility] in 2 months.  Medication Adjustments/Labs and Tests Ordered: Current  medicines are reviewed at length with the patient today.  Concerns regarding medicines are outlined above.   No orders of the defined types were placed in this encounter.  No orders of the defined types were placed in this encounter.   Signed, Chilton Si, MD  12/23/2023 8:05 PM    [facility]

## 2023-12-23 NOTE — Telephone Encounter (Signed)
 Patient is requesting to speak with a nurse in regard to her appointment scheduled for tomorrow morning. Please advise.

## 2023-12-24 ENCOUNTER — Encounter (HOSPITAL_BASED_OUTPATIENT_CLINIC_OR_DEPARTMENT_OTHER): Payer: PPO | Admitting: Cardiovascular Disease

## 2023-12-24 DIAGNOSIS — E7800 Pure hypercholesterolemia, unspecified: Secondary | ICD-10-CM

## 2023-12-24 DIAGNOSIS — I10 Essential (primary) hypertension: Secondary | ICD-10-CM

## 2023-12-24 DIAGNOSIS — I480 Paroxysmal atrial fibrillation: Secondary | ICD-10-CM

## 2023-12-24 DIAGNOSIS — I5033 Acute on chronic diastolic (congestive) heart failure: Secondary | ICD-10-CM

## 2023-12-24 DIAGNOSIS — I2510 Atherosclerotic heart disease of native coronary artery without angina pectoris: Secondary | ICD-10-CM

## 2023-12-27 ENCOUNTER — Encounter: Payer: Self-pay | Admitting: Podiatry

## 2023-12-27 ENCOUNTER — Ambulatory Visit: Admitting: Podiatry

## 2023-12-27 DIAGNOSIS — B351 Tinea unguium: Secondary | ICD-10-CM | POA: Diagnosis not present

## 2023-12-27 DIAGNOSIS — E1140 Type 2 diabetes mellitus with diabetic neuropathy, unspecified: Secondary | ICD-10-CM

## 2023-12-27 NOTE — Progress Notes (Signed)
 This patient presents to the office with chief complaint of long thick painful nails.  Patient says the nails are painful walking and wearing shoes.  This patient is unable to self treat.  This patient is unable to trim her nails since she is unable to reach her nails.  She is presently on blood thinner.   She presents to the office for preventative foot care services.  General Appearance  Alert, conversant and in no acute stress.  Vascular  Dorsalis pedis and posterior tibial  pulses are palpable  bilaterally.  Capillary return is within normal limits  bilaterally. Temperature is within normal limits  bilaterally.  Neurologic  Senn-Weinstein monofilament wire test within normal limits  bilaterally. Muscle power within normal limits bilaterally.  Nails Thick disfigured discolored nails with subungual debris  from second to fifth toes bilaterally. No evidence of bacterial infection or drainage bilaterally.  Orthopedic  No limitations of motion  feet .  No crepitus or effusions noted.  No bony pathology or digital deformities noted. Dorsal rearfoot exostosis  B/L. Contracted second toe left foot.  Skin  normotropic skin with no porokeratosis noted bilaterally.  No signs of infections or ulcers noted.     Onychomycosis  Nails  B/L.  Pain in right toes  Pain in left toes  Debridement of nails both feet followed trimming the nails with dremel tool.    RTC 3 months.   Helane Gunther DPM

## 2024-01-05 ENCOUNTER — Other Ambulatory Visit (HOSPITAL_COMMUNITY): Payer: Self-pay | Admitting: Internal Medicine

## 2024-01-06 ENCOUNTER — Other Ambulatory Visit: Payer: Self-pay | Admitting: Family

## 2024-01-06 DIAGNOSIS — J45909 Unspecified asthma, uncomplicated: Secondary | ICD-10-CM

## 2024-01-06 NOTE — Telephone Encounter (Signed)
 Complete

## 2024-01-10 DIAGNOSIS — J45909 Unspecified asthma, uncomplicated: Secondary | ICD-10-CM | POA: Diagnosis not present

## 2024-01-22 ENCOUNTER — Other Ambulatory Visit: Payer: Self-pay | Admitting: Family

## 2024-01-22 DIAGNOSIS — E1165 Type 2 diabetes mellitus with hyperglycemia: Secondary | ICD-10-CM

## 2024-01-23 ENCOUNTER — Ambulatory Visit (HOSPITAL_BASED_OUTPATIENT_CLINIC_OR_DEPARTMENT_OTHER): Admitting: Cardiovascular Disease

## 2024-01-23 ENCOUNTER — Encounter (HOSPITAL_BASED_OUTPATIENT_CLINIC_OR_DEPARTMENT_OTHER): Payer: Self-pay | Admitting: Cardiovascular Disease

## 2024-01-23 VITALS — BP 163/78 | HR 59 | Ht 63.0 in | Wt 199.0 lb

## 2024-01-23 DIAGNOSIS — I10 Essential (primary) hypertension: Secondary | ICD-10-CM

## 2024-01-23 DIAGNOSIS — I2510 Atherosclerotic heart disease of native coronary artery without angina pectoris: Secondary | ICD-10-CM

## 2024-01-23 DIAGNOSIS — I5033 Acute on chronic diastolic (congestive) heart failure: Secondary | ICD-10-CM

## 2024-01-23 DIAGNOSIS — E7800 Pure hypercholesterolemia, unspecified: Secondary | ICD-10-CM | POA: Diagnosis not present

## 2024-01-23 DIAGNOSIS — I480 Paroxysmal atrial fibrillation: Secondary | ICD-10-CM | POA: Diagnosis not present

## 2024-01-23 MED ORDER — HYDRALAZINE HCL 25 MG PO TABS: 25.0000 mg | ORAL_TABLET | Freq: Two times a day (BID) | ORAL | 1 refills | Status: DC

## 2024-01-23 MED ORDER — APIXABAN 5 MG PO TABS: 5.0000 mg | ORAL_TABLET | Freq: Two times a day (BID) | ORAL | 0 refills | Status: DC

## 2024-01-23 MED ORDER — BLOOD PRESSURE KIT: PACK | 0 refills | Status: AC

## 2024-01-23 MED ORDER — APIXABAN 5 MG PO TABS: 5.0000 mg | ORAL_TABLET | Freq: Two times a day (BID) | ORAL | Status: DC

## 2024-01-23 NOTE — Progress Notes (Signed)
 Advanced Hypertension Clinic Follow-up:    Date:  01/23/2024   ID:  Nan Aver, DOB [birth year], MRN [id number]  PCP:  Senaida Dama, NP  Cardiologist:  Maudine Sos, MD   Referring MD: Senaida Dama, NP   CC: Hypertension  History of Present Illness:    Rachel [name] is a 83 y.o. female with a hx of nonobstructive CAD, atrial fibrillation, hypertension, asthma, diabetes, here for follow-up. She was initially seen 11/01/2021 in the Advanced Hypertension Clinic. She had a televisit 06/2021 and her blood pressure was 157/87 on amlodipine , losartan , and HCTZ. Amlodipine  was increased to 10 mg. She previously had an Echo in 2019 which showed LVEF 65-70% and grade 1 diastolic dysfunction.  At her initial visit, she reported home blood pressures averaging 160-187 systolic prior to taking her medications. She had mildly elevated neck veins on exam. She had no symptoms of pheochromocytoma. Given that she was on ARB we did not check for hyperaldosteronism. We restarted her on 5 mg amlodipine  daily (previously intolerant of 10 mg dose but was stable on 5 mg). Losartan /HCTZ was switched to valsartan /HCTZ 320/25 mg daily. She was also started on spironolactone  25 mg daily. She was referred to PREP. Renal artery doppler and echocardiogram were ordered but not completed. Later she reported significant LE swelling, so amlodipine  was reduced from 5 mg to 2.5 mg daily. On 01/13/2023 she presented to the ED with complaints of worsening DOE for several days in the setting of months of leg swelling. Noted to be hypertensive in triage but otherwise without distress. BNP was normal. She was discharged on 20 mg furosemide  BID.    She was seen 12/2022 and her home blood pressures were averaging 165-167 systolic. This was similar to her BP in the office. There had been some confusion with her medications and some had been lost from her regime. We discontinued hospital-prescribed diuretics  and restarted amlodipine , spironolactone , and valsartan /HCTZ. She had been hesitant to refill her atorvastatin  after hearing about potential side effects. She was encouraged to restart her cholesterol medication.  From 6/2-02/27/2023 she was admitted after presenting with syncope and new onset atrial fibrillation with RVR. She also complained of chest pain and troponins were elevated, felt probably due to demand ischemia. She underwent left heart catheterization 02/25/23 showing mild nonobstructive CAD (prox RCA 20%, ost-proxCx 20%, ost-prox LAD 20%, mid LAD 20%). Echo during admission showed LVEF 65 to 70%, no RWMA, normal RV function, no significant valvular abnormalities. She reverted to sinus bradycardia with PO diltiazem  and Toprol  prior to discharge. Spironolactone  had been reduced and amlodipine  was held. She followed up with Neomi Banks, NP 03/08/23 and noted home blood pressures in systolic 160s-170s although she was checking prior to taking her medications. In the office her BP was 136/86. She was maintaining sinus bradycardia and denied palpitations. Noted to be intolerant of atorvastatin  20 mg and she was switched to 5 mg rosuvastatin  daily.  At her visit 06/2023 she noted feeling fatigued and having increased exertional dyspnea.  Blood pressures were uncontrolled.  She had an episode of syncope at which time her blood pressure was almost 200 systolic.  Spironolactone  was increased.  She was referred to the PREP program at the [facility].  She was admitted to the hospital 07/2023 with atrial flutter with RVR and syncope.  She was transition to diltiazem .  She underwent TEE/DCCV given that she had missed a dose of Eliquis .  Interestingly, blood pressures were well-controlled in the hospital on  diltiazem  only despite having uncontrolled blood pressures at home.  Valsartan /HCT was added back prior to discharge from the hospital.  She was seen in A-fib clinic 07/2023 and remained in sinus rhythm.  She noticed  ankle swelling since starting diltiazem .  They discussed rhythm control options.  She was given Lasix  and has not yet followed up with them.  At her visit 10/2023 BP was in the 170s-180s.  Valsartan  was restarted. Since her last appointment she has been feeling well.  BP remains uncontrolled. She hasn't been getting much exercise but she has been active.  She denies CP.  She does get shortness of breath when she tries to dance and has to rest between songs.  She denies LE edema, orthopnea or PND.    Previous antihypertensives: Amlodipine -increased edema at higher doses SPIRONOLACTONE - hyperkalemia  Past Medical History:  Diagnosis Date   (HFpEF) heart failure with preserved ejection fraction (HCC) 01/17/2023   Allergic rhinitis    CAD in native artery 07/17/2023   Diabetes mellitus, type 2 (HCC)    Hyperkalemia 08/14/2023   Hyperlipemia    Hypertension    Left shoulder pain 11/01/2021   Lipoma NEC    anterior upper chest   Osteoarthritis of knee    PAF (paroxysmal atrial fibrillation) (HCC) 02/24/2023   Shortness of breath 11/01/2021    Past Surgical History:  Procedure Laterality Date   ACNE CYST REMOVAL  1970   back   APPENDECTOMY  1963   CARDIOVERSION N/A 08/16/2023   Procedure: CARDIOVERSION (CATH LAB);  Surgeon: Harrold Lincoln, MD;  Location: [facility] INVASIVE CV LAB;  Service: Cardiovascular;  Laterality: N/A;   LEFT HEART CATH AND CORONARY ANGIOGRAPHY N/A 02/25/2023   Procedure: LEFT HEART CATH AND CORONARY ANGIOGRAPHY;  Surgeon: Odie Benne, MD;  Location: [facility] INVASIVE CV LAB;  Service: Cardiovascular;  Laterality: N/A;   OOPHORECTOMY  1985   TRANSESOPHAGEAL ECHOCARDIOGRAM (CATH LAB) N/A 08/16/2023   Procedure: TRANSESOPHAGEAL ECHOCARDIOGRAM;  Surgeon: Harrold Lincoln, MD;  Location: [facility] INVASIVE CV LAB;  Service: Cardiovascular;  Laterality: N/A;   VAGINAL HYSTERECTOMY  1985   complete    Current Medications: Current Meds  Medication Sig    acetaminophen  (TYLENOL ) 500 MG tablet Take 500 mg by mouth every 8 (eight) hours as needed for mild pain or headache.   albuterol  (VENTOLIN  HFA) 108 (90 Base) MCG/ACT inhaler INHALE 1-2 PUFFS INTO THE LUNGS EVERY 6 (SIX) HOURS AS NEEDED FOR WHEEZING OR SHORTNESS OF BREATH. INHALE 1-2 PUFFS BY MOUTH EVERY 6 HOURS AS NEEDED FOR WHEEZE OR SHORTNESS OF BREATH   apixaban  (ELIQUIS ) 5 MG TABS tablet Take 1 tablet (5 mg total) by mouth 2 (two) times daily for 7 days.   apixaban  (ELIQUIS ) 5 MG TABS tablet Take 1 tablet (5 mg total) by mouth 2 (two) times daily for 14 days.   budesonide  (PULMICORT ) 0.25 MG/2ML nebulizer solution Take 2 mLs (0.25 mg total) by nebulization 2 (two) times daily.   Continuous Glucose Receiver (FREESTYLE LIBRE 3 READER) DEVI 1 each by Does not apply route 4 (four) times daily -  before meals and at bedtime.   Continuous Glucose Sensor (FREESTYLE LIBRE 3 PLUS SENSOR) MISC USE 1 SENSOR AND CHANGE EVERY 15 DAYS TO CHECK BLOOD SUGAR 4 TIMES DAILY BEFORE MEALS AND BEDTIME.   diclofenac  Sodium (VOLTAREN ) 1 % GEL Apply 2 g topically 4 (four) times daily as needed.   diltiazem  (CARDIZEM  CD) 180 MG 24 hr capsule TAKE 1 CAPSULE BY MOUTH EVERY DAY  ELIQUIS  5 MG TABS tablet TAKE 1 TABLET BY MOUTH TWICE A DAY   fluticasone  (FLONASE ) 50 MCG/ACT nasal spray Place 2 sprays into both nostrils daily as needed for allergies or rhinitis.   ipratropium (ATROVENT  HFA) 17 MCG/ACT inhaler Inhale 1 puff into the lungs 3 (three) times daily.   loratadine  (CLARITIN ) 10 MG tablet TAKE 1 TABLET BY MOUTH EVERY DAY (DRUG NOT COVERED BY INS)   Multiple Vitamins-Minerals (CENTRUM SILVER 50+WOMEN) TABS Take 1 tablet by mouth daily.   mupirocin  cream (BACTROBAN ) 2 % Apply 1 Application topically 2 (two) times daily.   nystatin  (MYCOSTATIN /NYSTOP ) powder Apply topically 4 (four) times daily.   rosuvastatin  (CRESTOR ) 10 MG tablet Take 1 tablet (10 mg total) by mouth daily.   sitaGLIPtin  (JANUVIA ) 25 MG tablet Take 1  tablet (25 mg total) by mouth daily.   sodium chloride  (OCEAN) 0.65 % SOLN nasal spray Place 1 spray into both nostrils in the morning, at noon, in the evening, and at bedtime.   valsartan -hydrochlorothiazide  (DIOVAN  HCT) 160-25 MG tablet Take 1 tablet by mouth daily.   [DISCONTINUED] hydrALAZINE  (APRESOLINE ) 25 MG tablet Take 1 tablet (25 mg total) by mouth 2 (two) times daily at 8 am and 10 pm.     Allergies:   Atorvastatin , Glimepiride , and Metformin   Social History   Socioeconomic History   Marital status: Widowed    Spouse name: Not on file   Number of children: Not on file   Years of education: Not on file   Highest education level: Not on file  Occupational History   Occupation: department mang    Employer: [employer]    Comment: Retired  Tobacco Use   Smoking status: Never    Passive exposure: Never   Smokeless tobacco: Never   Tobacco comments:    Never smoked 08/21/23  Vaping Use   Vaping status: Never Used  Substance and Sexual Activity   Alcohol use: Yes    Alcohol/week: 1.0 standard drink of alcohol    Types: 1 Glasses of wine per week    Comment: ocassionally   Drug use: No   Sexual activity: Not Currently  Other Topics Concern   Not on file  Social History Narrative   No regular exercise   Widowed   Social Drivers of Health   Financial Resource Strain: High Risk (09/05/2023)   Overall Financial Resource Strain (CARDIA)    Difficulty of Paying Living Expenses: Hard  Food Insecurity: No Food Insecurity (11/25/2023)   Hunger Vital Sign    Worried About Running Out of Food in the Last Year: Never true    Ran Out of Food in the Last Year: Never true  Recent Concern: Food Insecurity - Food Insecurity Present (10/24/2023)   Hunger Vital Sign    Worried About Running Out of Food in the Last Year: Sometimes true    Ran Out of Food in the Last Year: Sometimes true  Transportation Needs: No Transportation Needs (11/25/2023)   PRAPARE - Therapist, art (Medical): No    Lack of Transportation (Non-Medical): No  Physical Activity: Inactive (09/05/2023)   Exercise Vital Sign    Days of Exercise per Week: 0 days    Minutes of Exercise per Session: 0 min  Stress: Not on file  Social Connections: Not on file     Family History: The patient's family history includes Aneurysm in an other family member; Cancer in her father; Coronary artery disease in her  brother; Diabetes in her brother, mother, and sister; Heart attack in her mother; Hypertension in her brother and sister.  ROS:   Please see the history of present illness.    (+) Fatigue (+) Occasional palpitations (+) Soreness of right hand All other systems reviewed and are negative.  EKGs/Labs/Other Studies Reviewed:    Left Heart Cath  02/25/2023:   Prox RCA lesion is 20% stenosed.   Ost Cx to Prox Cx lesion is 20% stenosed.   Ost LAD to Prox LAD lesion is 20% stenosed.   Mid LAD lesion is 20% stenosed.   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 Her troponin elevation is likely due to demand ischemia in the setting of atrial fib with RVR.   Echo  02/25/2023:  1. Left ventricular ejection fraction, by estimation, is 65 to 70%. The  left ventricle has normal function. The left ventricle has no regional  wall motion abnormalities. There is mild concentric left ventricular  hypertrophy. Left ventricular diastolic  function could not be evaluated.   2. Right ventricular systolic function is normal. The right ventricular  size is normal.   3. Left atrial size was mildly dilated.   4. The mitral valve is normal in structure. Trivial mitral valve  regurgitation. No evidence of mitral stenosis.   5. The aortic valve is normal in structure. Aortic valve regurgitation is  not visualized. No aortic stenosis is present.   6. The inferior vena cava is normal in size with greater than 50%  respiratory  variability, suggesting right atrial pressure of 3 mmHg.   Chest X-Ray  01/13/2023: IMPRESSION: 1. Mild cardiomegaly and increased central vascular prominence without overt edema. 2. No other evidence for acute chest process. Linear scarring or atelectasis left mid field.  Echo 01/09/2018: Study Conclusions  - Left ventricle: Wall thickness was increased in a pattern of    moderate LVH. Systolic function was vigorous. The estimated    ejection fraction was in the range of 65% to 70%. Wall motion was    normal; there were no regional wall motion abnormalities. Doppler    parameters are consistent with abnormal left ventricular    relaxation (grade 1 diastolic dysfunction).  - Aortic valve: Peak gradient 18 mmHg across the aortic valve    likely represents LV outflow tract gradient rather than valvular    AS, the valve appears to open well. Peak gradient (S): 18 mm Hg.  - Mitral valve: There was mild regurgitation.  - Right ventricle: The cavity size was normal. Systolic function    was normal.  - Tricuspid valve: Peak RV-RA gradient (S): 37 mm Hg.  - Pulmonary arteries: PA peak pressure: 40 mm Hg (S).  - Inferior vena cava: The vessel was normal in size. The    respirophasic diameter changes were in the normal range (>= 50%),    consistent with normal central venous pressure.   Impressions:  - Normal LV size with moderate LV hypertrophy. EF 65-70%, vigorous    systolic function. Normal RV size and systolic function. Mild    gradient across the LV outflow tract is likely due to    LVH/vigorous LV systolic function rather than valvular AS.   EKG:  EKG is personally reviewed. 07/17/2023:  Not ordered. 01/17/2023:  EKG was not ordered. 11/01/2021: Sinus rhythm. Rate 66 bpm.  Recent Labs: 08/13/2023: B Natriuretic Peptide 171.2; Magnesium 1.8; TSH 3.369 08/14/2023: Hemoglobin 11.5; Platelets 301 12/03/2023: ALT 11;  BUN 35; Creatinine, Ser 1.22; Potassium 5.4; Sodium 138   Recent  Lipid Panel    Component Value Date/Time   CHOL 169 12/03/2023 1305   TRIG 127 12/03/2023 1305   HDL 51 12/03/2023 1305   CHOLHDL 3.3 12/03/2023 1305   CHOLHDL 5 01/12/2020 1350   VLDL 45.2 (H) 01/12/2020 1350   LDLCALC 95 12/03/2023 1305   LDLDIRECT 139.0 01/12/2020 1350    Physical Exam:    VS:  BP (!) 163/78   Pulse (!) 59   Ht 5\' 3"  (1.6 m)   Wt 199 lb (90.3 kg)   SpO2 97%   BMI 35.25 kg/m  , BMI Body mass index is 35.25 kg/m. GENERAL:  Well appearing HEENT: Pupils equal round and reactive, fundi not visualized, oral mucosa unremarkable NECK: No JVP.  Waveform within normal limits, carotid upstroke brisk and symmetric, no bruits, no thyromegaly LUNGS:  Clear to auscultation bilaterally HEART:  RRR.  PMI not displaced or sustained,S1 and S2 within normal limits, no S3, no S4, no clicks, no rubs, no murmurs ABD:  Flat, positive bowel sounds normal in frequency in pitch, no bruits, no rebound, no guarding, no midline pulsatile mass, no hepatomegaly, no splenomegaly EXT:  2 plus pulses throughout, no LE edema bilaterally, no cyanosis no clubbing SKIN:  No rashes no nodules NEURO:  Cranial nerves II through XII grossly intact, motor grossly intact throughout PSYCH:  Cognitively intact, oriented to person place and time  ASSESSMENT/PLAN:    # Hypertension:  BP remains uncontrolled.  We will add hydralazine  25mg  bid.  Continue diltiazem  and valsartan /hydrochlorothiazide . Encouraged her to be more consistent with exercise.    # PAF:  Continue diltiazem  and Eliquis .  Currently in sinus rhythm.  She struggles with affording the Eliquis .  Will check for samples.   # CAD:  # Hyperlipidemia: She has non-obstructive disease.  Minimal on cath 02/2023.  Continue rosuvastatin .    Screening for Secondary Hypertension:     11/01/2021   10:31 AM  Causes  Drugs/Herbals Screened     - Comments Limits salt and caffeine.  No EtOH.  Rare NSAID use.  Renovascular HTN Screened     -  Comments Check renal artery Dopplers  Sleep Apnea Screened     - Comments Snoring and orthopnea.  No other symptoms.  We will get an echo and if it is unremarkable consider screening for sleep study.  Thyroid  Disease Screened     - Comments Check TSH  Hyperaldosteronism Not Screened     - Comments Currently on ARB.  Therefore will not start at this time.  Pheochromocytoma Screened     - Comments No symptoms  Cushing's Syndrome Not Screened  Hyperparathyroidism Screened  Coarctation of the Aorta Screened     - Comments BP symmetric  Compliance Screened    Relevant Labs/Studies:    Latest Ref Rng & Units 12/03/2023    1:05 PM 08/16/2023    4:03 AM 08/15/2023    3:43 AM  Basic Labs  Sodium 134 - 144 mmol/L 138  136  131   Potassium 3.5 - 5.2 mmol/L 5.4  4.5  4.5   Creatinine 0.57 - 1.00 mg/dL 1.61  0.96  0.45        Latest Ref Rng & Units 08/13/2023    1:28 PM 02/24/2023   12:36 PM  Thyroid    TSH 0.350 - 4.500 uIU/mL 3.369  1.996     Disposition:    FU with Kylen Ismael C. Theodis Fiscal,  MD, [facility] in 2-3 months.  Medication Adjustments/Labs and Tests Ordered: Current medicines are reviewed at length with the patient today.  Concerns regarding medicines are outlined above.   No orders of the defined types were placed in this encounter.  Meds ordered this encounter  Medications   apixaban  (ELIQUIS ) 5 MG TABS tablet    Sig: Take 1 tablet (5 mg total) by mouth 2 (two) times daily for 7 days.    Dispense:  14 tablet    Refill:  0    Lot Number?:   [id number]    Expiration Date?:   09/24/2024   apixaban  (ELIQUIS ) 5 MG TABS tablet    Sig: Take 1 tablet (5 mg total) by mouth 2 (two) times daily for 14 days.    Lot Number?:   [id number]    Expiration Date?:   11/22/2024   DISCONTD: hydrALAZINE  (APRESOLINE ) 25 MG tablet    Sig: Take 1 tablet (25 mg total) by mouth 2 (two) times daily at 8 am and 10 pm.    Dispense:  180 tablet    Refill:  1   hydrALAZINE  (APRESOLINE ) 25 MG tablet    Sig: Take 1  tablet (25 mg total) by mouth 2 (two) times daily. Try to take 12 hours apart    Dispense:  180 tablet    Refill:  1    D/c previous rx    Signed, Maudine Sos, MD  01/23/2024 5:23 PM    [facility]

## 2024-01-23 NOTE — Patient Instructions (Signed)
 Medication Instructions:  START HYDRALAZINE  25 MG TWICE A DAY   *If you need a refill on your cardiac medications before your next appointment, please call your pharmacy*  Lab Work: NONE  Testing/Procedures: NONE   Follow-Up: At [facility], you and your health needs are our priority.  As part of our continuing mission to provide you with exceptional heart care, our providers are all part of one team.  This team includes your primary Cardiologist (physician) and Advanced Practice Providers or APPs (Physician Assistants and Nurse Practitioners) who all work together to provide you with the care you need, when you need it.  Your next appointment:   2 TO 3 month(s)  Provider:   Maudine Sos, MD or Neomi Banks, NP    We recommend signing up for the patient portal called "MyChart".  Sign up information is provided on this After Visit Summary.  MyChart is used to connect with patients for Virtual Visits (Telemedicine).  Patients are able to view lab/test results, encounter notes, upcoming appointments, etc.  Non-urgent messages can be sent to your provider as well.   To learn more about what you can do with MyChart, go to [email or web address].   Other Instructions INCREASE YOUR EXERCISE   BUY A NEW BLOOD PRESSURE MACHINE AND MONITOR YOUR BLOOD PRESSURE TWICE A DAY  BRING READINGS TO FOLLOW UP

## 2024-01-23 NOTE — Addendum Note (Signed)
 Addended by: Marci Setter B on: 01/23/2024 05:59 PM   Modules accepted: Orders

## 2024-02-09 DIAGNOSIS — J45909 Unspecified asthma, uncomplicated: Secondary | ICD-10-CM | POA: Diagnosis not present

## 2024-02-12 ENCOUNTER — Other Ambulatory Visit (HOSPITAL_BASED_OUTPATIENT_CLINIC_OR_DEPARTMENT_OTHER): Admitting: Pharmacist

## 2024-02-12 ENCOUNTER — Encounter: Payer: Self-pay | Admitting: Pharmacist

## 2024-02-12 DIAGNOSIS — E785 Hyperlipidemia, unspecified: Secondary | ICD-10-CM

## 2024-02-12 DIAGNOSIS — I10 Essential (primary) hypertension: Secondary | ICD-10-CM

## 2024-02-12 NOTE — Progress Notes (Signed)
 Pharmacy Quality Measure Review  This patient is appearing on a report for adherence measure for cholesterol (statin), diabetes, and hypertension (ACEi/ARB) medications this calendar year.   Medication: rosuvastatin  Last fill date: 12/16/2023 for 90 day supply  Medication: valsartan -HCTZ Last fill date: 02/08/2024 for 90 day supply  Insurance report was not up to date. No action needed at this time.  Reminder set for upcoming rosuvastatin  due date.   Marene Shape, PharmD, Becky Bowels, CPP Clinical Pharmacist [facility] [phone]

## 2024-02-19 DIAGNOSIS — H43812 Vitreous degeneration, left eye: Secondary | ICD-10-CM | POA: Diagnosis not present

## 2024-02-19 DIAGNOSIS — H40023 Open angle with borderline findings, high risk, bilateral: Secondary | ICD-10-CM | POA: Diagnosis not present

## 2024-02-19 DIAGNOSIS — H35033 Hypertensive retinopathy, bilateral: Secondary | ICD-10-CM | POA: Diagnosis not present

## 2024-02-19 DIAGNOSIS — H25813 Combined forms of age-related cataract, bilateral: Secondary | ICD-10-CM | POA: Diagnosis not present

## 2024-02-19 DIAGNOSIS — E113413 Type 2 diabetes mellitus with severe nonproliferative diabetic retinopathy with macular edema, bilateral: Secondary | ICD-10-CM | POA: Diagnosis not present

## 2024-02-28 ENCOUNTER — Other Ambulatory Visit: Payer: Self-pay | Admitting: Family Medicine

## 2024-02-28 DIAGNOSIS — E1165 Type 2 diabetes mellitus with hyperglycemia: Secondary | ICD-10-CM

## 2024-03-03 ENCOUNTER — Telehealth: Payer: Self-pay | Admitting: Cardiovascular Disease

## 2024-03-03 MED ORDER — APIXABAN 5 MG PO TABS: 5.0000 mg | ORAL_TABLET | Freq: Two times a day (BID) | ORAL | Status: DC

## 2024-03-03 NOTE — Telephone Encounter (Signed)
 Patient calling the office for samples of medication:   1.  What medication and dosage are you requesting samples for?  apixaban  (ELIQUIS ) 5 MG TABS tablet (Expired)    2.  Are you currently out of this medication? Yes     Patient stated she found paper work for her to have labs completed one week later. Patient would like to know if she needs to come in for labs. Please advise.

## 2024-03-03 NOTE — Telephone Encounter (Signed)
 Advised patient needs labs Patient assistance forms left at front for pick up  Does not have money to get Eliquis , no money until gets social security check end of month  Samples x 2 weeks left at front

## 2024-03-03 NOTE — Telephone Encounter (Signed)
 Routing to correct triage pool for review

## 2024-03-04 DIAGNOSIS — E785 Hyperlipidemia, unspecified: Secondary | ICD-10-CM | POA: Diagnosis not present

## 2024-03-05 LAB — COMPREHENSIVE METABOLIC PANEL WITH GFR
ALT: 11 IU/L (ref 0–32)
AST: 13 IU/L (ref 0–40)
Albumin: 3.9 g/dL (ref 3.8–4.8)
Alkaline Phosphatase: 66 IU/L (ref 44–121)
BUN/Creatinine Ratio: 28 (ref 12–28)
BUN: 33 mg/dL — ABNORMAL HIGH (ref 8–27)
Bilirubin Total: 0.3 mg/dL (ref 0.0–1.2)
CO2: 19 mmol/L — ABNORMAL LOW (ref 20–29)
Calcium: 9.4 mg/dL (ref 8.7–10.3)
Chloride: 104 mmol/L (ref 96–106)
Creatinine, Ser: 1.2 mg/dL — ABNORMAL HIGH (ref 0.57–1.00)
Globulin, Total: 3.2 g/dL (ref 1.5–4.5)
Glucose: 182 mg/dL — ABNORMAL HIGH (ref 70–99)
Potassium: 5 mmol/L (ref 3.5–5.2)
Sodium: 141 mmol/L (ref 134–144)
Total Protein: 7.1 g/dL (ref 6.0–8.5)
eGFR: 46 mL/min/{1.73_m2} — ABNORMAL LOW (ref >59)

## 2024-03-05 LAB — LIPID PANEL
Chol/HDL Ratio: 3.4 ratio (ref 0.0–4.4)
Cholesterol, Total: 153 mg/dL (ref 100–199)
HDL: 45 mg/dL (ref >39)
LDL Chol Calc (NIH): 84 mg/dL (ref 0–99)
Triglycerides: 136 mg/dL (ref 0–149)
VLDL Cholesterol Cal: 24 mg/dL (ref 5–40)

## 2024-03-10 ENCOUNTER — Other Ambulatory Visit (HOSPITAL_BASED_OUTPATIENT_CLINIC_OR_DEPARTMENT_OTHER): Payer: Self-pay | Admitting: Family

## 2024-03-10 DIAGNOSIS — R040 Epistaxis: Secondary | ICD-10-CM

## 2024-03-10 DIAGNOSIS — I10 Essential (primary) hypertension: Secondary | ICD-10-CM

## 2024-03-10 DIAGNOSIS — D6859 Other primary thrombophilia: Secondary | ICD-10-CM

## 2024-03-10 DIAGNOSIS — E785 Hyperlipidemia, unspecified: Secondary | ICD-10-CM

## 2024-03-10 DIAGNOSIS — I480 Paroxysmal atrial fibrillation: Secondary | ICD-10-CM

## 2024-03-10 DIAGNOSIS — I25118 Atherosclerotic heart disease of native coronary artery with other forms of angina pectoris: Secondary | ICD-10-CM

## 2024-03-11 ENCOUNTER — Other Ambulatory Visit: Payer: Self-pay | Admitting: Pharmacist

## 2024-03-11 DIAGNOSIS — J45909 Unspecified asthma, uncomplicated: Secondary | ICD-10-CM | POA: Diagnosis not present

## 2024-03-11 NOTE — Progress Notes (Signed)
 Pharmacy Quality Measure Review  This patient is appearing on a report for adherence measure for cholesterol (statin), diabetes, and hypertension (ACEi/ARB) medications this calendar year.   Medication: rosuvastatin  Last fill date: 12/16/2023 for 90 day supply  Medication: valsartan -HCTZ Last fill date: 02/08/2024 for 90 day supply  Collaborated with pharmacy. Refill processed for rosuvastatin .  Marene Shape, PharmD, BCACP, CPP Clinical Pharmacist [facility] [phone]

## 2024-03-17 ENCOUNTER — Encounter (HOSPITAL_BASED_OUTPATIENT_CLINIC_OR_DEPARTMENT_OTHER): Admitting: Cardiovascular Disease

## 2024-03-18 ENCOUNTER — Other Ambulatory Visit: Payer: Self-pay | Admitting: Pharmacist

## 2024-03-18 NOTE — Progress Notes (Signed)
 Pharmacy Quality Measure Review  This patient is appearing on a report for adherence measure for cholesterol (statin), diabetes, and hypertension (ACEi/ARB) medications this calendar year.   Medication: rosuvastatin  Last fill date: 12/16/2023 for 90 day supply. Call placed to her pharmacy. She had two strengths on file. It looks like Cardiology wants her on 5 mg daily. I authorized a 90-day refill of the 5 mg rxn to be filled today, 03/18/2024.  Medication: valsartan -HCTZ Last fill date: 02/08/2024 for 90 day supply  Herlene Fleeta Morris, PharmD, [place], CPP Clinical Pharmacist [facility] [phone]

## 2024-03-20 ENCOUNTER — Ambulatory Visit
Admission: EM | Admit: 2024-03-20 | Discharge: 2024-03-20 | Disposition: A | Discharge status: Home / Self Care | Attending: Family Medicine | Admitting: Family Medicine

## 2024-03-20 ENCOUNTER — Encounter: Payer: Self-pay | Admitting: Emergency Medicine

## 2024-03-20 ENCOUNTER — Ambulatory Visit: Payer: Self-pay

## 2024-03-20 DIAGNOSIS — R059 Cough, unspecified: Secondary | ICD-10-CM | POA: Diagnosis not present

## 2024-03-20 DIAGNOSIS — J4530 Mild persistent asthma, uncomplicated: Secondary | ICD-10-CM | POA: Insufficient documentation

## 2024-03-20 DIAGNOSIS — B9789 Other viral agents as the cause of diseases classified elsewhere: Secondary | ICD-10-CM | POA: Insufficient documentation

## 2024-03-20 DIAGNOSIS — E119 Type 2 diabetes mellitus without complications: Secondary | ICD-10-CM | POA: Insufficient documentation

## 2024-03-20 DIAGNOSIS — J988 Other specified respiratory disorders: Secondary | ICD-10-CM | POA: Insufficient documentation

## 2024-03-20 MED ORDER — BENZONATATE 100 MG PO CAPS: 100.0000 mg | ORAL_CAPSULE | Freq: Three times a day (TID) | ORAL | 0 refills | Status: DC | PRN

## 2024-03-20 MED ORDER — PROMETHAZINE-DM 6.25-15 MG/5ML PO SYRP: 5.0000 mL | ORAL_SOLUTION | Freq: Every evening | ORAL | 0 refills | Status: DC | PRN

## 2024-03-20 MED ORDER — PREDNISONE 20 MG PO TABS
20.0000 mg | ORAL_TABLET | Freq: Every day | ORAL | 0 refills | Status: AC
Start: 2024-03-20 — End: ?

## 2024-03-20 MED ORDER — IPRATROPIUM-ALBUTEROL 0.5-2.5 (3) MG/3ML IN SOLN
3.0000 mL | RESPIRATORY_TRACT | 0 refills | Status: AC | PRN
Start: 2024-03-20 — End: ?

## 2024-03-20 NOTE — Telephone Encounter (Signed)
    FYI Only or Action Required?: FYI only for provider.  Patient was last seen in primary care on 10/11/2023 by Lorren Greig PARAS, NP. Called Nurse Triage reporting Cough. Symptoms began YESTERDAY. Interventions attempted: Prescription medications: inhaler and Rest, hydration, or home remedies. Symptoms are: gradually worsening.  Triage Disposition: See Physician Within 24 Hours  Patient/caregiver understands and will follow disposition?: Yes--Patient states she is going to Urgent Care due to no availability at her PCP office                Copied from CRM [id number]. Topic: Clinical - Red Word Triage >> Mar 20, 2024  8:00 AM Carlatta H wrote: [facility] that prompted transfer to Nurse Triage: Patient has Afib and she has had a cough and congestion for 2 days//She's is having shortness of breath   ----------------------------------------------------------------------- From previous Reason for Contact - Scheduling: Patient/patient representative is calling to schedule an appointment. Refer to attachments for appointment information. Reason for Disposition  [1] Continuous (nonstop) coughing interferes with work or school AND [2] no improvement using cough treatment per Care Advice  Answer Assessment - Initial Assessment Questions 1. ONSET: When did the cough begin?      Yesterday 03/19/2024 2. SEVERITY: How bad is the cough today?      Woke up patient throughout the night with coughing episodes 3. SPUTUM: Describe the color of your sputum (none, dry cough; clear, white, yellow, green)     clear 4. HEMOPTYSIS: Are you coughing up any blood? If so ask: How much? (flecks, streaks, tablespoons, etc.)     No 5. DIFFICULTY BREATHING: Are you having difficulty breathing? If Yes, ask: How bad is it? (e.g., mild, moderate, severe)    - MILD: No SOB at rest, mild SOB with walking, speaks normally in sentences, can lie down, no retractions, pulse < 100.    - MODERATE: SOB at  rest, SOB with minimal exertion and prefers to sit, cannot lie down flat, speaks in phrases, mild retractions, audible wheezing, pulse 100-120.    - SEVERE: Very SOB at rest, speaks in single words, struggling to breathe, sitting hunched forward, retractions, pulse > 120      No 6. FEVER: Do you have a fever? If Yes, ask: What is your temperature, how was it measured, and when did it start?     No 7. CARDIAC HISTORY: Do you have any history of heart disease? (e.g., heart attack, congestive heart failure)      Afib 8. LUNG HISTORY: Do you have any history of lung disease?  (e.g., pulmonary embolus, asthma, emphysema)     Seasonal allergies 9. PE RISK FACTORS: Do you have a history of blood clots? (or: recent major surgery, recent prolonged travel, bedridden)     No 10. OTHER SYMPTOMS: Do you have any other symptoms? (e.g., runny nose, wheezing, chest pain)       Coughing episodes, congestion, clear phlegm   Patient does have an inhaler. Patient denies shortness of breath or difficulty breathing and states that only during a bad coughing fit she will feel like she gets a little winded. Patient is advised that if anything worsens to go to the Emergency Room. She verbalized understanding and states she is going to go to Urgent Care at this time.  Protocols used: Cough - Acute Productive-A-AH

## 2024-03-20 NOTE — ED Triage Notes (Signed)
 Pt present c/o cough and nasal congestion x 2 days. Pt says she has used nebulizer at home 2 nights ago but she is now out of the Albuterol . Pt denies emesis and diarrhea.

## 2024-03-20 NOTE — Discharge Instructions (Signed)
 We will notify you of your test results as they arrive and may take between about 24 hours.  I encourage you to sign up for MyChart if you have not already done so as this can be the easiest way for us  to communicate results to you online or through a phone app.  Generally, we only contact you if it is a positive test result.  In the meantime, if you develop worsening symptoms including fever, chest pain, shortness of breath despite our current treatment plan then please report to the emergency room as this may be a sign of worsening status from possible viral infection.  Otherwise, we will manage this as a viral respiratory infection. For sore throat or cough try using a honey-based tea. Use 3 teaspoons of honey with juice squeezed from half lemon. Place shaved pieces of ginger into 1/2-1 cup of water and warm over stove top. Then mix the ingredients and repeat every 4 hours as needed. Please take Tylenol  500mg -650mg  every 6 hours for aches and pains, fevers. Hydrate very well with at least 2 liters of water. Eat light meals such as soups to replenish electrolytes and soft fruits, veggies. Start an antihistamine like Zyrtec  (10mg  daily) for postnasal drainage, sinus congestion.  You can take this together with prednisone  and nebulized albuterol . Use cough medication as needed.

## 2024-03-20 NOTE — ED Provider Notes (Signed)
 [facility]  Note:  This document was prepared using Conservation officer, historic buildings and may include unintentional dictation errors.  MRN: [id number] DOB: 1940/11/18  Subjective:   Rachel [name] is a 83 y.o. female presenting for 2-day history of malaise, fatigue, sinus congestion, coughing, chest tightness, coughing spells that are waking her up out of her sleep.  Needs a refill for nebulized albuterol .  Denies fever, chest pain, n/v, abdominal pain, changes to bowel or urinary habits, rashes. No smoking of any kind including cigarettes, cigars, vaping, marijuana use.  Blood sugar check in clinic was 165, fasting.  He  No current facility-administered medications for this encounter.  Current Outpatient Medications:    acetaminophen  (TYLENOL ) 500 MG tablet, Take 500 mg by mouth every 8 (eight) hours as needed for mild pain or headache., Disp: , Rfl:    albuterol  (VENTOLIN  HFA) 108 (90 Base) MCG/ACT inhaler, INHALE 1-2 PUFFS INTO THE LUNGS EVERY 6 (SIX) HOURS AS NEEDED FOR WHEEZING OR SHORTNESS OF BREATH. INHALE 1-2 PUFFS BY MOUTH EVERY 6 HOURS AS NEEDED FOR WHEEZE OR SHORTNESS OF BREATH, Disp: 18 each, Rfl: 2   apixaban  (ELIQUIS ) 5 MG TABS tablet, Take 1 tablet (5 mg total) by mouth 2 (two) times daily for 14 days., Disp: , Rfl:    apixaban  (ELIQUIS ) 5 MG TABS tablet, Take 1 tablet (5 mg total) by mouth 2 (two) times daily for 14 days., Disp: , Rfl:    benzonatate  (TESSALON ) 100 MG capsule, TAKE 1 CAPSULE BY MOUTH THREE TIMES A DAY AS NEEDED FOR COUGH (Patient not taking: Reported on 01/23/2024), Disp: 30 capsule, Rfl: 1   Blood Pressure KIT, Check blood pressure twice a day Dx: I10, Disp: 1 kit, Rfl: 0   budesonide  (PULMICORT ) 0.25 MG/2ML nebulizer solution, Take 2 mLs (0.25 mg total) by nebulization 2 (two) times daily., Disp: 60 mL, Rfl: 4   Continuous Glucose Receiver (FREESTYLE LIBRE 3 READER) DEVI, 1 each by Does not apply route 4 (four) times daily -   before meals and at bedtime., Disp: 1 each, Rfl: 2   Continuous Glucose Sensor (FREESTYLE LIBRE 3 PLUS SENSOR) MISC, USE 1 SENSOR AND CHANGE EVERY 15 DAYS TO CHECK BLOOD SUGAR 4 TIMES DAILY BEFORE MEALS AND BEDTIME., Disp: 2 each, Rfl: 2   diclofenac  Sodium (VOLTAREN ) 1 % GEL, Apply 2 g topically 4 (four) times daily as needed., Disp: 50 g, Rfl: 1   diltiazem  (CARDIZEM  CD) 180 MG 24 hr capsule, TAKE 1 CAPSULE BY MOUTH EVERY DAY, Disp: 90 capsule, Rfl: 1   ELIQUIS  5 MG TABS tablet, TAKE 1 TABLET BY MOUTH TWICE A DAY, Disp: 60 tablet, Rfl: 5   fluticasone  (FLONASE ) 50 MCG/ACT nasal spray, Place 2 sprays into both nostrils daily as needed for allergies or rhinitis., Disp: , Rfl:    hydrALAZINE  (APRESOLINE ) 25 MG tablet, Take 1 tablet (25 mg total) by mouth 2 (two) times daily. Try to take 12 hours apart, Disp: 180 tablet, Rfl: 1   ipratropium (ATROVENT  HFA) 17 MCG/ACT inhaler, Inhale 1 puff into the lungs 3 (three) times daily., Disp: 12.9 g, Rfl: 2   loratadine  (CLARITIN ) 10 MG tablet, TAKE 1 TABLET BY MOUTH EVERY DAY (DRUG NOT COVERED BY INS), Disp: 90 tablet, Rfl: 0   Multiple Vitamins-Minerals (CENTRUM SILVER 50+WOMEN) TABS, Take 1 tablet by mouth daily., Disp: , Rfl:    mupirocin  cream (BACTROBAN ) 2 %, Apply 1 Application topically 2 (two) times daily., Disp: 60 g, Rfl: 0  nystatin  (MYCOSTATIN /NYSTOP ) powder, Apply topically 4 (four) times daily., Disp: 60 g, Rfl: 0   rosuvastatin  (CRESTOR ) 5 MG tablet, Take 1 tablet (5 mg total) by mouth daily., Disp: 90 tablet, Rfl: 3   sitaGLIPtin  (JANUVIA ) 25 MG tablet, Take 1 tablet (25 mg total) by mouth daily., Disp: 30 tablet, Rfl: 1   sodium chloride  (OCEAN) 0.65 % SOLN nasal spray, Place 1 spray into both nostrils in the morning, at noon, in the evening, and at bedtime., Disp: 30 mL, Rfl: 0   valsartan -hydrochlorothiazide  (DIOVAN  HCT) 160-25 MG tablet, Take 1 tablet by mouth daily., Disp: 90 tablet, Rfl: 3   Allergies  Allergen Reactions    Atorvastatin  Other (See Comments)    Caused aching in the body   Glimepiride  Diarrhea   Metformin Diarrhea    Past Medical History:  Diagnosis Date   (HFpEF) heart failure with preserved ejection fraction (HCC) 01/17/2023   Allergic rhinitis    CAD in native artery 07/17/2023   Diabetes mellitus, type 2 (HCC)    Hyperkalemia 08/14/2023   Hyperlipemia    Hypertension    Left shoulder pain 11/01/2021   Lipoma NEC    anterior upper chest   Osteoarthritis of knee    PAF (paroxysmal atrial fibrillation) (HCC) 02/24/2023   Shortness of breath 11/01/2021     Past Surgical History:  Procedure Laterality Date   ACNE CYST REMOVAL  1970   back   APPENDECTOMY  1963   CARDIOVERSION N/A 08/16/2023   Procedure: CARDIOVERSION (CATH LAB);  Surgeon: Barbaraann Darryle Ned, MD;  Location: [facility] INVASIVE CV LAB;  Service: Cardiovascular;  Laterality: N/A;   LEFT HEART CATH AND CORONARY ANGIOGRAPHY N/A 02/25/2023   Procedure: LEFT HEART CATH AND CORONARY ANGIOGRAPHY;  Surgeon: Verlin Lonni BIRCH, MD;  Location: [facility] INVASIVE CV LAB;  Service: Cardiovascular;  Laterality: N/A;   OOPHORECTOMY  1985   TRANSESOPHAGEAL ECHOCARDIOGRAM (CATH LAB) N/A 08/16/2023   Procedure: TRANSESOPHAGEAL ECHOCARDIOGRAM;  Surgeon: Barbaraann Darryle Ned, MD;  Location: [facility] INVASIVE CV LAB;  Service: Cardiovascular;  Laterality: N/A;   VAGINAL HYSTERECTOMY  1985   complete    Family History  Problem Relation Age of Onset   Diabetes Mother    Heart attack Mother    Cancer Father        brain   Diabetes Sister    Hypertension Sister    Diabetes Brother    Hypertension Brother    Coronary artery disease Brother    Aneurysm Other        niece    Social History   Tobacco Use   Smoking status: Never    Passive exposure: Never   Smokeless tobacco: Never   Tobacco comments:    Never smoked 08/21/23  Vaping Use   Vaping status: Never Used  Substance Use Topics   Alcohol use: Yes    Alcohol/week: 1.0 standard  drink of alcohol    Types: 1 Glasses of wine per week    Comment: ocassionally   Drug use: No    ROS   Objective:   Vitals: BP (!) 154/76 (BP Location: Left Arm)   Pulse 68   Temp 98 F (36.7 C) (Oral)   Resp 18   Wt 199 lb 1.2 oz (90.3 kg)   SpO2 99%   BMI 35.26 kg/m   Physical Exam Constitutional:      General: She is not in acute distress.    Appearance: Normal appearance. She is well-developed and normal weight. She is not  ill-appearing, toxic-appearing or diaphoretic.  HENT:     Head: Normocephalic and atraumatic.     Right Ear: Tympanic membrane, ear canal and external ear normal. No drainage or tenderness. No middle ear effusion. There is no impacted cerumen. Tympanic membrane is not erythematous or bulging.     Left Ear: Tympanic membrane, ear canal and external ear normal. No drainage or tenderness.  No middle ear effusion. There is no impacted cerumen. Tympanic membrane is not erythematous or bulging.     Nose: Nose normal. No congestion or rhinorrhea.     Mouth/Throat:     Mouth: Mucous membranes are moist. No oral lesions.     Pharynx: No pharyngeal swelling, oropharyngeal exudate, posterior oropharyngeal erythema or uvula swelling.     Tonsils: No tonsillar exudate or tonsillar abscesses.   Eyes:     General: No scleral icterus.       Right eye: No discharge.        Left eye: No discharge.     Extraocular Movements: Extraocular movements intact.     Right eye: Normal extraocular motion.     Left eye: Normal extraocular motion.     Conjunctiva/sclera: Conjunctivae normal.    Cardiovascular:     Rate and Rhythm: Normal rate and regular rhythm.     Heart sounds: Normal heart sounds. No murmur heard.    No friction rub. No gallop.  Pulmonary:     Effort: Pulmonary effort is normal. No respiratory distress.     Breath sounds: No stridor. No wheezing, rhonchi or rales.  Chest:     Chest wall: No tenderness.   Musculoskeletal:     Cervical back: Normal  range of motion and neck supple.  Lymphadenopathy:     Cervical: No cervical adenopathy.   Skin:    General: Skin is warm and dry.   Neurological:     General: No focal deficit present.     Mental Status: She is alert and oriented to person, place, and time.   Psychiatric:        Mood and Affect: Mood normal.        Behavior: Behavior normal.     Assessment and Plan :   PDMP not reviewed this encounter.  1. Viral respiratory infection   2. Type 2 diabetes mellitus treated without insulin  (HCC)   3. Cough, unspecified type   4. Mild persistent asthma without complication    COVID testing pending, recommend supportive care for viral respiratory infection.  Refills provided for her nebulized ipratropium albuterol  solution.  Recommended prednisone  course at 20 mg for her respiratory symptoms and in consideration of her hyperglycemia.  Deferred imaging given clear cardiopulmonary exam, hemodynamically stable vital signs.  Counseled patient on potential for adverse effects with medications prescribed/recommended today, ER and return-to-clinic precautions discussed, patient verbalized understanding.    Christopher Savannah, PA-C 03/20/24 [id number]

## 2024-03-20 NOTE — Telephone Encounter (Signed)
 Noted

## 2024-03-22 LAB — SARS CORONAVIRUS 2 (TAT 6-24 HRS): SARS Coronavirus 2: NEGATIVE

## 2024-03-23 ENCOUNTER — Telehealth: Payer: Self-pay | Admitting: Family

## 2024-03-23 NOTE — Telephone Encounter (Signed)
 Called patient to schedule Ed/ Urgent Care follow up after recent visit

## 2024-03-30 ENCOUNTER — Encounter: Payer: Self-pay | Admitting: *Deleted

## 2024-03-31 ENCOUNTER — Ambulatory Visit (HOSPITAL_BASED_OUTPATIENT_CLINIC_OR_DEPARTMENT_OTHER): Admitting: Cardiovascular Disease

## 2024-03-31 ENCOUNTER — Encounter (HOSPITAL_BASED_OUTPATIENT_CLINIC_OR_DEPARTMENT_OTHER): Payer: Self-pay | Admitting: Cardiovascular Disease

## 2024-03-31 VITALS — BP 152/61 | HR 60 | Ht 63.0 in | Wt 198.0 lb

## 2024-03-31 DIAGNOSIS — I483 Typical atrial flutter: Secondary | ICD-10-CM | POA: Diagnosis not present

## 2024-03-31 DIAGNOSIS — E7800 Pure hypercholesterolemia, unspecified: Secondary | ICD-10-CM | POA: Diagnosis not present

## 2024-03-31 DIAGNOSIS — I10 Essential (primary) hypertension: Secondary | ICD-10-CM | POA: Diagnosis not present

## 2024-03-31 DIAGNOSIS — I480 Paroxysmal atrial fibrillation: Secondary | ICD-10-CM | POA: Diagnosis not present

## 2024-03-31 DIAGNOSIS — I5033 Acute on chronic diastolic (congestive) heart failure: Secondary | ICD-10-CM

## 2024-03-31 DIAGNOSIS — I2510 Atherosclerotic heart disease of native coronary artery without angina pectoris: Secondary | ICD-10-CM | POA: Diagnosis not present

## 2024-03-31 MED ORDER — HYDRALAZINE HCL 50 MG PO TABS: 50.0000 mg | ORAL_TABLET | Freq: Two times a day (BID) | ORAL | 1 refills | Status: DC

## 2024-03-31 NOTE — Progress Notes (Signed)
 Advanced [facility]pertension Clinic Follow-up:    Date:  04/08/2024   ID:  Rachel [name], DOB 10/20/1940, MRN [id number]  PCP:  Lorren Greig PARAS, NP  Cardiologist:  Annabella Scarce, MD   Referring MD: Lorren Greig PARAS, NP   CC: Hypertension  History of Present Illness:    Rachel [name] is a 82 y.o. female with a hx of nonobstructive CAD, atrial fibrillation, hypertension, asthma, diabetes, here for follow-up. She was initially seen 11/01/2021 in the [facility]. She had a televisit 06/2021 and her blood pressure was 157/87 on amlodipine , losartan , and HCTZ. Amlodipine  was increased to 10 mg. She previously had an Echo in 2019 which showed LVEF 65-70% and grade 1 diastolic dysfunction.  At her initial visit, she reported home blood pressures averaging 160-187 systolic prior to taking her medications. She had mildly elevated neck veins on exam. She had no symptoms of pheochromocytoma. Given that she was on ARB we did not check for hyperaldosteronism. We restarted her on 5 mg amlodipine  daily (previously intolerant of 10 mg dose but was stable on 5 mg). Losartan /HCTZ was switched to valsartan /HCTZ 320/25 mg daily. She was also started on spironolactone  25 mg daily. She was referred to PREP. Renal artery doppler and echocardiogram were ordered but not completed. Later she reported significant LE swelling, so amlodipine  was reduced from 5 mg to 2.5 mg daily. On 01/13/2023 she presented to the ED with complaints of worsening DOE for several days in the setting of months of leg swelling. Noted to be hypertensive in triage but otherwise without distress. BNP was normal. She was discharged on 20 mg furosemide  BID.    She was seen 12/2022 and her home blood pressures were averaging 165-167 systolic. This was similar to her BP in the office. There had been some confusion with her medications and some had been lost from her regime. We discontinued hospital-prescribed diuretics  and restarted amlodipine , spironolactone , and valsartan /HCTZ. She had been hesitant to refill her atorvastatin  after hearing about potential side effects. She was encouraged to restart her cholesterol medication.  From 6/2-02/27/2023 she was admitted after presenting with syncope and new onset atrial fibrillation with RVR. She also complained of chest pain and troponins were elevated, felt probably due to demand ischemia. She underwent left heart catheterization 02/25/23 showing mild nonobstructive CAD (prox RCA 20%, ost-proxCx 20%, ost-prox LAD 20%, mid LAD 20%). Echo during admission showed LVEF 65 to 70%, no RWMA, normal RV function, no significant valvular abnormalities. She reverted to sinus bradycardia with PO diltiazem  and Toprol  prior to discharge. Spironolactone  had been reduced and amlodipine  was held. She followed up with Reche Finder, NP 03/08/23 and noted home blood pressures in systolic 160s-170s although she was checking prior to taking her medications. In the office her BP was 136/86. She was maintaining sinus bradycardia and denied palpitations. Noted to be intolerant of atorvastatin  20 mg and she was switched to 5 mg rosuvastatin  daily.  At her visit 06/2023 she noted feeling fatigued and having increased exertional dyspnea.  Blood pressures were uncontrolled.  She had an episode of syncope at which time her blood pressure was almost 200 systolic.  Spironolactone  was increased.  She was referred to the PREP program at the [facility].  She was admitted to the hospital 07/2023 with atrial flutter with RVR and syncope.  She was transition to diltiazem .  She underwent TEE/DCCV given that she had missed a dose of Eliquis .  Interestingly, blood pressures were well-controlled in the hospital on  diltiazem  only despite having uncontrolled blood pressures at home.  Valsartan /HCT was added back prior to discharge from the hospital.  She was seen in A-fib clinic 07/2023 and remained in sinus rhythm.  She noticed  ankle swelling since starting diltiazem .  They discussed rhythm control options.  She was given Lasix  and has not yet followed up with them.  At her visit 10/2023 BP was in the 170s-180s.  Valsartan  was restarted. At her visit 01/2024 blood pressure remained uncontrolled.  She was not getting formal exercise but was active.  Hydralazine  was added to her regimen.  She struggled with affording Eliquis .  Discussed the use of AI scribe software for clinical note transcription with the patient, who gave verbal consent to proceed.  History of Present Illness Rachel [name] experiences fluctuating blood pressure readings, with systolic values ranging from 147 to 151 mmHg and diastolic values in the 60s to 29d, which is an improvement from previous diastolic readings in the 90s. Her current medication regimen includes hydralazine  25 mg twice daily, diltiazem  in the morning, and another unspecified medication. She monitors her blood pressure before noon.  She is managing her diabetes but experiences visual disturbances. She has adjusted her diet by reducing fatty foods and carbohydrates, opting for chicken and [place] sausage, and consuming more salads and greens. She avoids eating bread and mac and cheese together and has reduced her intake of cookies. Her blood sugars have been high, and her most recent cholesterol results were a total cholesterol of 153 mg/dL and LDL of 84 mg/dL.  She experiences fatigue and attributes it to a lack of exercise. She plans to start walking at the community center and has been engaging in physical activities like dancing and chair aerobics with her granddaughter.  Her most recent creatinine level was 1.2 mg/dL, improved from a previous level of 2 mg/dL. She is on Eliquis  for atrial fibrillation, taking it twice daily, but expresses concern about the cost of $47 per month. She prefers to continue with Eliquis  as she experiences no side effects.   Previous  antihypertensives: Amlodipine -increased edema at higher doses SPIRONOLACTONE - hyperkalemia  Past Medical History:  Diagnosis Date   (HFpEF) heart failure with preserved ejection fraction (HCC) 01/17/2023   Allergic rhinitis    CAD in native artery 07/17/2023   Diabetes mellitus, type 2 (HCC)    Hyperkalemia 08/14/2023   Hyperlipemia    Hypertension    Left shoulder pain 11/01/2021   Lipoma NEC    anterior upper chest   Osteoarthritis of knee    PAF (paroxysmal atrial fibrillation) (HCC) 02/24/2023   Shortness of breath 11/01/2021    Past Surgical History:  Procedure Laterality Date   ACNE CYST REMOVAL  1970   back   APPENDECTOMY  1963   CARDIOVERSION N/A 08/16/2023   Procedure: CARDIOVERSION (CATH LAB);  Surgeon: Barbaraann Darryle Ned, MD;  Location: [facility] INVASIVE CV LAB;  Service: Cardiovascular;  Laterality: N/A;   LEFT HEART CATH AND CORONARY ANGIOGRAPHY N/A 02/25/2023   Procedure: LEFT HEART CATH AND CORONARY ANGIOGRAPHY;  Surgeon: Verlin Lonni BIRCH, MD;  Location: [facility] INVASIVE CV LAB;  Service: Cardiovascular;  Laterality: N/A;   OOPHORECTOMY  1985   TRANSESOPHAGEAL ECHOCARDIOGRAM (CATH LAB) N/A 08/16/2023   Procedure: TRANSESOPHAGEAL ECHOCARDIOGRAM;  Surgeon: Barbaraann Darryle Ned, MD;  Location: [facility] INVASIVE CV LAB;  Service: Cardiovascular;  Laterality: N/A;   VAGINAL HYSTERECTOMY  1985   complete    Current Medications: Current Meds  Medication Sig   acetaminophen  (TYLENOL ) 500  MG tablet Take 500 mg by mouth every 8 (eight) hours as needed for mild pain or headache.   albuterol  (VENTOLIN  HFA) 108 (90 Base) MCG/ACT inhaler INHALE 1-2 PUFFS INTO THE LUNGS EVERY 6 (SIX) HOURS AS NEEDED FOR WHEEZING OR SHORTNESS OF BREATH. INHALE 1-2 PUFFS BY MOUTH EVERY 6 HOURS AS NEEDED FOR WHEEZE OR SHORTNESS OF BREATH   benzonatate  (TESSALON ) 100 MG capsule Take 1 capsule (100 mg total) by mouth 3 (three) times daily as needed for cough.   Blood Pressure KIT Check blood pressure  twice a day Dx: I10   budesonide  (PULMICORT ) 0.25 MG/2ML nebulizer solution Take 2 mLs (0.25 mg total) by nebulization 2 (two) times daily.   Continuous Glucose Receiver (FREESTYLE LIBRE 3 READER) DEVI 1 each by Does not apply route 4 (four) times daily -  before meals and at bedtime.   Continuous Glucose Sensor (FREESTYLE LIBRE 3 PLUS SENSOR) MISC USE 1 SENSOR AND CHANGE EVERY 15 DAYS TO CHECK BLOOD SUGAR 4 TIMES DAILY BEFORE MEALS AND BEDTIME.   diclofenac  Sodium (VOLTAREN ) 1 % GEL Apply 2 g topically 4 (four) times daily as needed.   diltiazem  (CARDIZEM  CD) 180 MG 24 hr capsule TAKE 1 CAPSULE BY MOUTH EVERY DAY   ipratropium (ATROVENT  HFA) 17 MCG/ACT inhaler Inhale 1 puff into the lungs 3 (three) times daily.   ipratropium-albuterol  (DUONEB) 0.5-2.5 (3) MG/3ML SOLN Take 3 mLs by nebulization every 4 (four) hours as needed.   loratadine  (CLARITIN ) 10 MG tablet TAKE 1 TABLET BY MOUTH EVERY DAY (DRUG NOT COVERED BY INS)   mupirocin  cream (BACTROBAN ) 2 % Apply 1 Application topically 2 (two) times daily.   nystatin  (MYCOSTATIN /NYSTOP ) powder Apply topically 4 (four) times daily.   promethazine -dextromethorphan (PROMETHAZINE -DM) 6.25-15 MG/5ML syrup Take 5 mLs by mouth at bedtime as needed for cough.   rosuvastatin  (CRESTOR ) 5 MG tablet Take 1 tablet (5 mg total) by mouth daily.   valsartan -hydrochlorothiazide  (DIOVAN  HCT) 160-25 MG tablet Take 1 tablet by mouth daily.   [DISCONTINUED] apixaban  (ELIQUIS ) 5 MG TABS tablet Take 1 tablet (5 mg total) by mouth 2 (two) times daily for 14 days.   [DISCONTINUED] hydrALAZINE  (APRESOLINE ) 25 MG tablet Take 1 tablet (25 mg total) by mouth 2 (two) times daily. Try to take 12 hours apart     Allergies:   Atorvastatin , Glimepiride , and Metformin   Social History   Socioeconomic History   Marital status: Widowed    Spouse name: Not on file   Number of children: Not on file   Years of education: Not on file   Highest education level: Not on file   Occupational History   Occupation: department mang    Employer: [employer]    Comment: Retired  Tobacco Use   Smoking status: Never    Passive exposure: Never   Smokeless tobacco: Never   Tobacco comments:    Never smoked 08/21/23  Vaping Use   Vaping status: Never Used  Substance and Sexual Activity   Alcohol use: Yes    Alcohol/week: 1.0 standard drink of alcohol    Types: 1 Glasses of wine per week    Comment: ocassionally   Drug use: No   Sexual activity: Not Currently  Other Topics Concern   Not on file  Social History Narrative   No regular exercise   Widowed   Social Drivers of Health   Financial Resource Strain: High Risk (09/05/2023)   Overall Financial Resource Strain (CARDIA)    Difficulty of Paying  Living Expenses: Hard  Food Insecurity: No Food Insecurity (11/25/2023)   Hunger Vital Sign    Worried About Running Out of Food in the Last Year: Never true    Ran Out of Food in the Last Year: Never true  Recent Concern: Food Insecurity - Food Insecurity Present (10/24/2023)   Hunger Vital Sign    Worried About Running Out of Food in the Last Year: Sometimes true    Ran Out of Food in the Last Year: Sometimes true  Transportation Needs: No Transportation Needs (11/25/2023)   PRAPARE - Administrator, Civil Service (Medical): No    Lack of Transportation (Non-Medical): No  Physical Activity: Inactive (09/05/2023)   Exercise Vital Sign    Days of Exercise per Week: 0 days    Minutes of Exercise per Session: 0 min  Stress: Not on file  Social Connections: Not on file     Family History: The patient's family history includes Aneurysm in an other family member; Cancer in her father; Coronary artery disease in her brother; Diabetes in her brother, mother, and sister; Heart attack in her mother; Hypertension in her brother and sister.  ROS:   Please see the history of present illness.    (+) Fatigue (+) Occasional palpitations (+) Soreness of  right hand All other systems reviewed and are negative.  EKGs/Labs/Other Studies Reviewed:    Left Heart Cath  02/25/2023:   Prox RCA lesion is 20% stenosed.   Ost Cx to Prox Cx lesion is 20% stenosed.   Ost LAD to Prox LAD lesion is 20% stenosed.   Mid LAD lesion is 20% stenosed.   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 Her troponin elevation is likely due to demand ischemia in the setting of atrial fib with RVR.   Echo  02/25/2023:  1. Left ventricular ejection fraction, by estimation, is 65 to 70%. The  left ventricle has normal function. The left ventricle has no regional  wall motion abnormalities. There is mild concentric left ventricular  hypertrophy. Left ventricular diastolic  function could not be evaluated.   2. Right ventricular systolic function is normal. The right ventricular  size is normal.   3. Left atrial size was mildly dilated.   4. The mitral valve is normal in structure. Trivial mitral valve  regurgitation. No evidence of mitral stenosis.   5. The aortic valve is normal in structure. Aortic valve regurgitation is  not visualized. No aortic stenosis is present.   6. The inferior vena cava is normal in size with greater than 50%  respiratory variability, suggesting right atrial pressure of 3 mmHg.   Chest X-Ray  01/13/2023: IMPRESSION: 1. Mild cardiomegaly and increased central vascular prominence without overt edema. 2. No other evidence for acute chest process. Linear scarring or atelectasis left mid field.  Echo 01/09/2018: Study Conclusions  - Left ventricle: Wall thickness was increased in a pattern of    moderate LVH. Systolic function was vigorous. The estimated    ejection fraction was in the range of 65% to 70%. Wall motion was    normal; there were no regional wall motion abnormalities. Doppler    parameters are consistent with abnormal left ventricular    relaxation (grade 1 diastolic  dysfunction).  - Aortic valve: Peak gradient 18 mmHg across the aortic valve    likely represents LV outflow tract gradient rather than valvular    AS, the valve appears to open  well. Peak gradient (S): 18 mm Hg.  - Mitral valve: There was mild regurgitation.  - Right ventricle: The cavity size was normal. Systolic function    was normal.  - Tricuspid valve: Peak RV-RA gradient (S): 37 mm Hg.  - Pulmonary arteries: PA peak pressure: 40 mm Hg (S).  - Inferior vena cava: The vessel was normal in size. The    respirophasic diameter changes were in the normal range (>= 50%),    consistent with normal central venous pressure.   Impressions:  - Normal LV size with moderate LV hypertrophy. EF 65-70%, vigorous    systolic function. Normal RV size and systolic function. Mild    gradient across the LV outflow tract is likely due to    LVH/vigorous LV systolic function rather than valvular AS.   EKG:  EKG is personally reviewed. 07/17/2023:  Not ordered. 01/17/2023:  EKG was not ordered. 11/01/2021: Sinus rhythm. Rate 66 bpm.  Recent Labs: 08/13/2023: B Natriuretic Peptide 171.2; Magnesium 1.8; TSH 3.369 08/14/2023: Hemoglobin 11.5; Platelets 301 03/04/2024: ALT 11; BUN 33; Creatinine, Ser 1.20; Potassium 5.0; Sodium 141   Recent Lipid Panel    Component Value Date/Time   CHOL 153 03/04/2024 0920   TRIG 136 03/04/2024 0920   HDL 45 03/04/2024 0920   CHOLHDL 3.4 03/04/2024 0920   CHOLHDL 5 01/12/2020 1350   VLDL 45.2 (H) 01/12/2020 1350   LDLCALC 84 03/04/2024 0920   LDLDIRECT 139.0 01/12/2020 1350    Physical Exam:    VS:  BP (!) 152/61 (BP Location: Left Arm, Patient Position: Sitting, Cuff Size: Normal)   Pulse 60   Ht 5' 3 (1.6 m)   Wt 198 lb (89.8 kg)   SpO2 99%   BMI 35.07 kg/m  , BMI Body mass index is 35.07 kg/m. GENERAL:  Well appearing HEENT: Pupils equal round and reactive, fundi not visualized, oral mucosa unremarkable NECK: No JVP.  Waveform within normal limits,  carotid upstroke brisk and symmetric, no bruits, no thyromegaly LUNGS:  Clear to auscultation bilaterally HEART:  RRR.  PMI not displaced or sustained,S1 and S2 within normal limits, no S3, no S4, no clicks, no rubs, no murmurs ABD:  Flat, positive bowel sounds normal in frequency in pitch, no bruits, no rebound, no guarding, no midline pulsatile mass, no hepatomegaly, no splenomegaly EXT:  2 plus pulses throughout, no LE edema bilaterally, no cyanosis no clubbing SKIN:  No rashes no nodules NEURO:  Cranial nerves II through XII grossly intact, motor grossly intact throughout PSYCH:  Cognitively intact, oriented to person place and time  ASSESSMENT/PLAN:    Assessment & Plan # Atrial fibrillation Managed with Eliquis . No side effects reported. Discussed cost concerns and alternatives. Prefers Eliquis  for efficacy and tolerability.  Consider Pradaxa at follow up. - Continue Eliquis . - Check for Eliquis  samples to assist with cost.  # Hypertension Managed with hydralazine , diltiazem , and valsartan  hydrochlorothiazide . Systolic pressure elevated at 849 mmHg, diastolic improved to 60s. - Increase hydralazine  to 50 mg twice daily. - Continue diltiazem  and valsartan  hydrochlorothiazide .  # Type 2 diabetes mellitus with hyperglycemia Suboptimal management with high blood sugars and visual disturbances. Implementing dietary modifications and exercise. - Encourage dietary modifications with reduced carbohydrates and increased fruits and vegetables. - Promote regular exercise, aiming for at least 15 minutes of activity.  # Hyperlipidemia Managed with low-dose statin. Cholesterol stable, total 153 mg/dL, LDL 84 mg/dL. Goal to reduce LDL to 70 mg/dL with lifestyle changes. - Continue rosuvastatin  at 5 mg. -  Encourage lifestyle modifications to reduce LDL.  Chronic kidney disease, unspecified   Screening for Secondary Hypertension:     11/01/2021   10:31 AM  Causes  Drugs/Herbals Screened      - Comments Limits salt and caffeine.  No EtOH.  Rare NSAID use.  Renovascular HTN Screened     - Comments Check renal artery Dopplers  Sleep Apnea Screened     - Comments Snoring and orthopnea.  No other symptoms.  We will get an echo and if it is unremarkable consider screening for sleep study.  Thyroid  Disease Screened     - Comments Check TSH  Hyperaldosteronism Not Screened     - Comments Currently on ARB.  Therefore will not start at this time.  Pheochromocytoma Screened     - Comments No symptoms  Cushing's Syndrome Not Screened  Hyperparathyroidism Screened  Coarctation of the Aorta Screened     - Comments BP symmetric  Compliance Screened    Relevant Labs/Studies:    Latest Ref Rng & Units 03/04/2024    9:20 AM 12/03/2023    1:05 PM 08/16/2023    4:03 AM  Basic Labs  Sodium 134 - 144 mmol/L 141  138  136   Potassium 3.5 - 5.2 mmol/L 5.0  5.4  4.5   Creatinine 0.57 - 1.00 mg/dL 8.79  8.77  8.56        Latest Ref Rng & Units 08/13/2023    1:28 PM 02/24/2023   12:36 PM  Thyroid    TSH 0.350 - 4.500 uIU/mL 3.369  1.996     Disposition:    FU with Elizabethanne Lusher C. Raford, MD, [facility] in 4 months.  Medication Adjustments/Labs and Tests Ordered: Current medicines are reviewed at length with the patient today.  Concerns regarding medicines are outlined above.   No orders of the defined types were placed in this encounter.  Meds ordered this encounter  Medications   hydrALAZINE  (APRESOLINE ) 50 MG tablet    Sig: Take 1 tablet (50 mg total) by mouth 2 (two) times daily. Try to take 12 hours apart    Dispense:  180 tablet    Refill:  1    D/c previous rx    Signed, Annabella Raford, MD  04/08/2024 3:42 PM    [facility]

## 2024-03-31 NOTE — Patient Instructions (Addendum)
 Medication Instructions:  INCREASE YOUR HYDRALAZINE  TO 50 MG TWICE A DAY   Labwork: NONE  Testing/Procedures: NONE   Follow-Up: 4 MONTHS WITH DR [place], CAITLIN W NP, OR KRISTIN A PHARM D  If you need a refill on your cardiac medications before your next appointment, please call your pharmacy.

## 2024-04-02 ENCOUNTER — Telehealth: Payer: Self-pay | Admitting: Physician Assistant

## 2024-04-02 NOTE — Telephone Encounter (Signed)
 Patient contacted after-hours answering service due to concern of elevated heart rate.  According to the patient, she has both Kardia mobile device and the pulse oximeter at home.  Based on pulse oximeter her heart rate is in the 80s, however Kardia mobile device suggested her heart rate is in the 130s.  I asked her to lay her arms on the table when she used a Kardia mobile device to decrease the amount of muscle twitching.  When she did so, heart rate was in the 80s on the [place] mobile device.  She denies any kind of discomfort.  Given well-controlled heart rate and lack of symptom, I did not recommend she go to the emergency room.  I did however recommend she set up a MyChart so okay she can upload the Kardia mobile strips to her cardiologist to review.  She will try to set it up tomorrow.

## 2024-04-03 ENCOUNTER — Ambulatory Visit: Admitting: Podiatrist

## 2024-04-03 ENCOUNTER — Ambulatory Visit: Admitting: Podiatry

## 2024-04-08 ENCOUNTER — Encounter (HOSPITAL_BASED_OUTPATIENT_CLINIC_OR_DEPARTMENT_OTHER): Payer: Self-pay | Admitting: Cardiovascular Disease

## 2024-04-10 ENCOUNTER — Other Ambulatory Visit: Payer: Self-pay | Admitting: Family

## 2024-04-10 DIAGNOSIS — R238 Other skin changes: Secondary | ICD-10-CM

## 2024-04-15 ENCOUNTER — Telehealth: Payer: Self-pay | Admitting: Family

## 2024-04-15 NOTE — Telephone Encounter (Signed)
 Patient was identified as falling into the [place] Measure - Diabetes.   Patient was: Referred to pharmacy for chronic disease management.

## 2024-05-20 ENCOUNTER — Telehealth (HOSPITAL_BASED_OUTPATIENT_CLINIC_OR_DEPARTMENT_OTHER): Payer: Self-pay | Admitting: Cardiovascular Disease

## 2024-05-20 NOTE — Telephone Encounter (Signed)
 Left message to call back.

## 2024-05-20 NOTE — Telephone Encounter (Signed)
 Pt c/o medication issue:  1. Name of Medication:   hydrALAZINE  (APRESOLINE ) 50 MG tablet   2. How are you currently taking this medication (dosage and times per day)?   3. Are you having a reaction (difficulty breathing--STAT)?   4. What is your medication issue?   Patient stated this medication is making her confused and nervous and causing her to have racing heart beats.  Patient stated her BP yesterday was 150/47 and noted her diastolic number has been trending 47-50.  Patient noted she skipped taking this medication one day and did not have the symptoms.

## 2024-05-21 NOTE — Telephone Encounter (Signed)
 Left message for pt to call.

## 2024-05-22 NOTE — Telephone Encounter (Signed)
 Okay to reduce to Hydralazine  25mg  BID. Recommend check BP once per day and keep a log and call us  Tuesday with BP readings. We can determine if further medication changes needed at that time.   Darrell Leonhardt S Sufyan Meidinger, NP

## 2024-05-22 NOTE — Telephone Encounter (Signed)
 Left the pt a message to call the office back to endorse recommendations per Reche Finder, NP.

## 2024-05-22 NOTE — Telephone Encounter (Signed)
 Patient returned RN's call.

## 2024-05-22 NOTE — Telephone Encounter (Signed)
 Blood pressure 143/47 yesterday   She does not want to continue the Hydralazine  50 mg twice a day, stated feels like she is in a daze Willing to go back to he 25 mg twice a day tolerated ok   Will forward to Reche ORN NP for review

## 2024-05-28 NOTE — Telephone Encounter (Signed)
 Left message for pt to call.

## 2024-06-04 MED ORDER — HYDRALAZINE HCL 25 MG PO TABS: 25.0000 mg | ORAL_TABLET | Freq: Two times a day (BID) | ORAL | 1 refills | Status: DC

## 2024-06-04 NOTE — Telephone Encounter (Signed)
 Spoke to patient and gave recommendations to   reduce to Hydralazine  25mg  BID. Recommend check BP once per day and keep a log and call us  Tuesday with BP readings. We can determine if further medication changes needed at that time.    She said the 50 mg tablets made her so sick she threw them all in the trash.  I have sent in a new prescription for 25 mg bid to her stated pharmacy.  She will call back in 2 weeks with BP log update.

## 2024-06-19 ENCOUNTER — Other Ambulatory Visit (HOSPITAL_BASED_OUTPATIENT_CLINIC_OR_DEPARTMENT_OTHER): Payer: Self-pay | Admitting: Family

## 2024-06-19 DIAGNOSIS — D6859 Other primary thrombophilia: Secondary | ICD-10-CM

## 2024-06-19 DIAGNOSIS — E785 Hyperlipidemia, unspecified: Secondary | ICD-10-CM

## 2024-06-19 DIAGNOSIS — I10 Essential (primary) hypertension: Secondary | ICD-10-CM

## 2024-06-19 DIAGNOSIS — I25118 Atherosclerotic heart disease of native coronary artery with other forms of angina pectoris: Secondary | ICD-10-CM

## 2024-06-19 DIAGNOSIS — R040 Epistaxis: Secondary | ICD-10-CM

## 2024-06-19 DIAGNOSIS — I480 Paroxysmal atrial fibrillation: Secondary | ICD-10-CM

## 2024-06-19 NOTE — Telephone Encounter (Signed)
 Prescription refill request for Eliquis  received. Indication:a fib Last office visit: 03/31/24 Scr: 1.2 03/04/24 epic Age: 83 Weight: 89kg

## 2024-06-26 ENCOUNTER — Other Ambulatory Visit: Payer: Self-pay | Admitting: Family

## 2024-06-26 DIAGNOSIS — E1165 Type 2 diabetes mellitus with hyperglycemia: Secondary | ICD-10-CM

## 2024-07-14 ENCOUNTER — Encounter: Payer: Self-pay | Admitting: Family

## 2024-07-14 ENCOUNTER — Ambulatory Visit (INDEPENDENT_AMBULATORY_CARE_PROVIDER_SITE_OTHER)

## 2024-07-14 ENCOUNTER — Ambulatory Visit (INDEPENDENT_AMBULATORY_CARE_PROVIDER_SITE_OTHER): Admitting: Family

## 2024-07-14 VITALS — BP 95/68 | HR 59 | Temp 97.6°F | Resp 16 | Ht 63.0 in | Wt 200.6 lb

## 2024-07-14 DIAGNOSIS — M25562 Pain in left knee: Secondary | ICD-10-CM

## 2024-07-14 DIAGNOSIS — N189 Chronic kidney disease, unspecified: Secondary | ICD-10-CM | POA: Diagnosis not present

## 2024-07-14 DIAGNOSIS — E1165 Type 2 diabetes mellitus with hyperglycemia: Secondary | ICD-10-CM | POA: Diagnosis not present

## 2024-07-14 DIAGNOSIS — L299 Pruritus, unspecified: Secondary | ICD-10-CM | POA: Diagnosis not present

## 2024-07-14 DIAGNOSIS — Z7984 Long term (current) use of oral hypoglycemic drugs: Secondary | ICD-10-CM

## 2024-07-14 DIAGNOSIS — M25561 Pain in right knee: Secondary | ICD-10-CM

## 2024-07-14 DIAGNOSIS — M1712 Unilateral primary osteoarthritis, left knee: Secondary | ICD-10-CM | POA: Diagnosis not present

## 2024-07-14 DIAGNOSIS — G629 Polyneuropathy, unspecified: Secondary | ICD-10-CM

## 2024-07-14 DIAGNOSIS — E1122 Type 2 diabetes mellitus with diabetic chronic kidney disease: Secondary | ICD-10-CM | POA: Diagnosis not present

## 2024-07-14 DIAGNOSIS — E119 Type 2 diabetes mellitus without complications: Secondary | ICD-10-CM

## 2024-07-14 MED ORDER — SITAGLIPTIN PHOSPHATE 25 MG PO TABS: 25.0000 mg | ORAL_TABLET | Freq: Every day | ORAL | 0 refills | Status: DC

## 2024-07-14 MED ORDER — TRIAMCINOLONE ACETONIDE 0.025 % EX CREA: 1.0000 | TOPICAL_CREAM | Freq: Two times a day (BID) | CUTANEOUS | 2 refills | Status: AC

## 2024-07-14 MED ORDER — GABAPENTIN 300 MG PO CAPS: 300.0000 mg | ORAL_CAPSULE | Freq: Every day | ORAL | 0 refills | Status: DC | PRN

## 2024-07-14 NOTE — Progress Notes (Signed)
 Diabetes check, and numbness and tingling in feet,  patient fell hard and her knees are in pain

## 2024-07-14 NOTE — Progress Notes (Signed)
 Patient ID: Rachel [name], female    DOB: December 04, 1940  MRN: [id number]  CC: Chronic Conditions Follow-Up  Subjective: Rachel [name] is a 83 y.o. female who presents for chronic conditions follow-up.   Her concerns today include:  - Doing well on Sitagliptin , no issues/concerns. Intolerant to Metformin. Denies red flag symptoms associated with diabetes. States she was never seen by Endocrinology.  - Due for diabetic eye exam. - Due for diabetic foot exam. - Bilateral feet numbness and tingling. Denies red flag symptoms.  - Bilateral feet itching. Denies red flag symptoms.  - States she was never seen by Nephrology.  - Bilateral knee pain. States 2 weeks ago she fell on both knees after tripping over her grandson's toy. Taking over-the-counter Tylenol  helps. She did not hit her head and did not lose consciousness.  - Established with Cardiology for chronic conditions.   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   Bacterial pharyngitis 08/25/2014   Acute bronchitis 07/03/2014   Oral thrush 07/03/2014   Onychomycosis 02/18/2013   URI (upper respiratory infection) 07/11/2012   Cough 07/11/2012   Ganglion cyst 06/25/2011   Foot pain, left 06/25/2011   Otitis media 06/25/2011   Lipoma 11/03/2010   EUSTACHIAN TUBE DYSFUNCTION, RIGHT 02/18/2010   Asthma with acute exacerbation 11/02/2009   ONYCHOMYCOSIS, TOENAILS 05/18/2009   Hyperlipidemia 09/15/2008    DM2 (diabetes mellitus, type 2) (HCC) 04/19/2007   Primary hypertension 04/19/2007   Allergic rhinitis 04/19/2007   OSTEOARTHRITIS 04/19/2007     Current Outpatient Medications on File Prior to Visit  Medication Sig Dispense Refill   acetaminophen  (TYLENOL ) 500 MG tablet Take 500 mg by mouth every 8 (eight) hours as needed for mild pain or headache.     albuterol  (VENTOLIN  HFA) 108 (90 Base) MCG/ACT inhaler INHALE 1-2 PUFFS INTO THE LUNGS EVERY 6 (SIX) HOURS AS NEEDED FOR WHEEZING OR SHORTNESS OF BREATH. INHALE 1-2 PUFFS BY MOUTH EVERY 6 HOURS AS NEEDED FOR WHEEZE OR SHORTNESS OF BREATH 18 each 2   benzonatate  (TESSALON ) 100 MG capsule Take 1 capsule (100 mg total) by mouth 3 (three) times daily as needed for cough. 30 capsule 0   Blood Pressure KIT Check blood pressure twice a day Dx: I10 1 kit 0   budesonide  (PULMICORT ) 0.25 MG/2ML nebulizer solution Take 2 mLs (0.25 mg total) by nebulization 2 (two) times daily. 60 mL 4   Continuous Glucose Receiver (FREESTYLE LIBRE 3 READER) DEVI 1 each by Does not apply route 4 (four) times daily -  before meals and at bedtime. 1 each 2   Continuous Glucose Sensor (FREESTYLE LIBRE 3 PLUS SENSOR) MISC USE 1 SENSOR AND CHANGE EVERY 15 DAYS TO CHECK BLOOD SUGAR 4 TIMES DAILY BEFORE MEALS AND BEDTIME. 2 each 2   diclofenac  Sodium (VOLTAREN ) 1 % GEL Apply 2 g topically 4 (four) times daily as needed. 50 g 1   diltiazem  (CARDIZEM  CD) 180 MG 24 hr capsule TAKE 1 CAPSULE BY  MOUTH EVERY DAY 90 capsule 1   ELIQUIS  5 MG TABS tablet TAKE 1 TABLET BY MOUTH TWICE A DAY 60 tablet 5   hydrALAZINE  (APRESOLINE ) 25 MG tablet Take 1 tablet (25 mg total) by mouth 2 (two) times daily. Try to take 12 hours apart 180 tablet 1   hydrALAZINE  (APRESOLINE ) 50 MG tablet Take 50 mg by mouth 2 (two) times daily.     ipratropium (ATROVENT  HFA) 17 MCG/ACT inhaler Inhale 1 puff into the lungs 3 (three) times daily. 12.9 g 2   ipratropium-albuterol  (DUONEB) 0.5-2.5 (3) MG/3ML SOLN Take 3  mLs by nebulization every 4 (four) hours as needed. 75 mL 0   loratadine  (CLARITIN ) 10 MG tablet TAKE 1 TABLET BY MOUTH EVERY DAY (DRUG NOT COVERED BY INS) 90 tablet 0   Multiple Vitamins-Minerals (CENTRUM SILVER 50+WOMEN) TABS Take 1 tablet by mouth daily.     mupirocin  cream (BACTROBAN ) 2 % APPLY TO AFFECTED AREA TWICE A DAY 60 g 0   nystatin  (MYCOSTATIN /NYSTOP ) powder Apply topically 4 (four) times daily. 60 g 0   promethazine -dextromethorphan (PROMETHAZINE -DM) 6.25-15 MG/5ML syrup Take 5 mLs by mouth at bedtime as needed for cough. 100 mL 0   rosuvastatin  (CRESTOR ) 5 MG tablet Take 1 tablet (5 mg total) by mouth daily. 90 tablet 3   fluticasone  (FLONASE ) 50 MCG/ACT nasal spray Place 2 sprays into both nostrils daily as needed for allergies or rhinitis. (Patient not taking: Reported on 04/08/2024)     predniSONE  (DELTASONE ) 20 MG tablet Take 1 tablet (20 mg total) by mouth daily with breakfast. (Patient not taking: Reported on 03/31/2024) 5 tablet 0   sodium chloride  (OCEAN) 0.65 % SOLN nasal spray Place 1 spray into both nostrils in the morning, at noon, in the evening, and at bedtime. (Patient not taking: Reported on 03/31/2024) 30 mL 0   valsartan -hydrochlorothiazide  (DIOVAN  HCT) 160-25 MG tablet Take 1 tablet by mouth daily. 90 tablet 3   No current facility-administered medications on file prior to visit.    Allergies  Allergen Reactions   Atorvastatin  Other (See Comments)    Caused aching in the body   Glimepiride  Diarrhea   Metformin Diarrhea    Social History   Socioeconomic History   Marital status: Widowed    Spouse name: Not on file   Number of children: Not on file   Years of education: Not on file   Highest education level: Not on file  Occupational History   Occupation: department mang    Employer: [employer]    Comment: Retired  Tobacco Use   Smoking status: Never    Passive exposure: Never   Smokeless tobacco: Never   Tobacco comments:    Never smoked  08/21/23  Vaping Use   Vaping status: Never Used  Substance and Sexual Activity   Alcohol use: Yes    Alcohol/week: 1.0 standard drink of alcohol    Types: 1 Glasses of wine per week    Comment: ocassionally   Drug use: No   Sexual activity: Not Currently  Other Topics Concern   Not on file  Social History Narrative   No regular exercise   Widowed   Social Drivers of Health   Financial Resource Strain: High Risk (09/05/2023)   Overall Financial Resource Strain (CARDIA)    Difficulty of Paying Living Expenses: Hard  Food Insecurity: No Food Insecurity (11/25/2023)   Hunger Vital Sign    Worried About Running Out of Food in the Last Year: Never true  Ran Out of Food in the Last Year: Never true  Recent Concern: Food Insecurity - Food Insecurity Present (10/24/2023)   Hunger Vital Sign    Worried About Running Out of Food in the Last Year: Sometimes true    Ran Out of Food in the Last Year: Sometimes true  Transportation Needs: No Transportation Needs (11/25/2023)   PRAPARE - Administrator, Civil Service (Medical): No    Lack of Transportation (Non-Medical): No  Physical Activity: Inactive (09/05/2023)   Exercise Vital Sign    Days of Exercise per Week: 0 days    Minutes of Exercise per Session: 0 min  Stress: Not on file  Social Connections: Not on file  Intimate Partner Violence: Not At Risk (11/25/2023)   Humiliation, Afraid, Rape, and Kick questionnaire    Fear of Current or Ex-Partner: No    Emotionally Abused: No    Physically Abused: No    Sexually Abused: No    Family History  Problem Relation Age of Onset   Diabetes Mother    Heart attack Mother    Cancer Father        brain   Diabetes Sister    Hypertension Sister    Diabetes Brother    Hypertension Brother    Coronary artery disease Brother    Aneurysm Other        niece    Past Surgical History:  Procedure Laterality Date   ACNE CYST REMOVAL  1970   back   APPENDECTOMY  1963    CARDIOVERSION N/A 08/16/2023   Procedure: CARDIOVERSION (CATH LAB);  Surgeon: Barbaraann Darryle Ned, MD;  Location: [facility] INVASIVE CV LAB;  Service: Cardiovascular;  Laterality: N/A;   LEFT HEART CATH AND CORONARY ANGIOGRAPHY N/A 02/25/2023   Procedure: LEFT HEART CATH AND CORONARY ANGIOGRAPHY;  Surgeon: Verlin Lonni BIRCH, MD;  Location: [facility] INVASIVE CV LAB;  Service: Cardiovascular;  Laterality: N/A;   OOPHORECTOMY  1985   TRANSESOPHAGEAL ECHOCARDIOGRAM (CATH LAB) N/A 08/16/2023   Procedure: TRANSESOPHAGEAL ECHOCARDIOGRAM;  Surgeon: Barbaraann Darryle Ned, MD;  Location: [facility] INVASIVE CV LAB;  Service: Cardiovascular;  Laterality: N/A;   VAGINAL HYSTERECTOMY  1985   complete    ROS: Review of Systems Negative except as stated above  PHYSICAL EXAM: BP 95/68   Pulse (!) 59   Temp 97.6 F (36.4 C) (Oral)   Resp 16   Ht 5' 3 (1.6 m)   Wt 200 lb 9.6 oz (91 kg)   SpO2 96%   BMI 35.53 kg/m   Physical Exam HENT:     Head: Normocephalic and atraumatic.     Nose: Nose normal.     Mouth/Throat:     Mouth: Mucous membranes are moist.     Pharynx: Oropharynx is clear.  Eyes:     Extraocular Movements: Extraocular movements intact.     Conjunctiva/sclera: Conjunctivae normal.     Pupils: Pupils are equal, round, and reactive to light.  Cardiovascular:     Rate and Rhythm: Bradycardia present.     Pulses: Normal pulses.     Heart sounds: Normal heart sounds.  Pulmonary:     Effort: Pulmonary effort is normal.     Breath sounds: Normal breath sounds.  Musculoskeletal:        General: Normal range of motion.     Cervical back: Normal range of motion and neck supple.     Right hip: Normal.     Left hip: Normal.     Right  upper leg: Normal.     Left upper leg: Normal.     Right knee: Normal.     Left knee: Normal.     Right lower leg: Normal.     Left lower leg: Normal.     Right ankle: Normal.     Left ankle: Normal.     Right foot: Normal.     Left foot: Normal.  Skin:     General: Skin is warm and dry.  Neurological:     General: No focal deficit present.     Mental Status: She is alert and oriented to person, place, and time.  Psychiatric:        Mood and Affect: Mood normal.        Behavior: Behavior normal.    ASSESSMENT AND PLAN: 1. Type 2 diabetes mellitus with hyperglycemia, without long-term current use of insulin  (HCC) (Primary) - Hemoglobin A1c result pending.  - Continue Sitagliptin  as prescribed.  - Patient intolerant to Metformin. - Routine screening.  - Discussed the importance of healthy eating habits, low-carbohydrate diet, low-sugar diet, regular aerobic exercise (at least 150 minutes a week as tolerated) and medication compliance to achieve or maintain control of diabetes. Counseled on medication adherence/adverse effects.  - Referral to Endocrinology for evaluation/management. - Follow-up with primary provider as scheduled. - Microalbumin / creatinine urine ratio - Hemoglobin A1c - Ambulatory referral to Endocrinology - sitaGLIPtin  (JANUVIA ) 25 MG tablet; Take 1 tablet (25 mg total) by mouth daily.  Dispense: 90 tablet; Refill: 0  2. Diabetic eye exam [facility]) - Referral to Ophthalmology for evaluation/management. - Ambulatory referral to Ophthalmology  3. Encounter for diabetic foot exam [facility]) - Referral to Podiatry for evaluation/management. - Ambulatory referral to Podiatry  4. Neuropathy - Gabapentin  as prescribed. Counseled on medication adherence/adverse effects. - Follow-up with primary provider as scheduled.  - gabapentin  (NEURONTIN ) 300 MG capsule; Take 1 capsule (300 mg total) by mouth daily as needed.  Dispense: 90 capsule; Refill: 0  5. Pruritus - Triamcinolone  as prescribed. Counseled on medication adherence/adverse effects.  - Follow-up with primary provider as scheduled.  - triamcinolone  (KENALOG ) 0.025 % cream; Apply 1 Application topically 2 (two) times daily.  Dispense: 60 g; Refill: 2  6. Chronic kidney  disease, unspecified CKD stage - Routine screening.  - Referral to Nephrology for evaluation/management.  - Basic Metabolic Panel - Ambulatory referral to Nephrology  7. Pain in both knees, unspecified chronicity - DG Knee Right and DG Knee Left for evaluation. - DG Knee Complete 4 Views Right; Future - DG Knee Complete 4 Views Left; Future   Patient was given the opportunity to ask questions.  Patient verbalized understanding of the plan and was able to repeat key elements of the plan. Patient was given clear instructions to go to Emergency Department or return to medical center if symptoms don't improve, worsen, or new problems develop.The patient verbalized understanding.   Orders Placed This Encounter  Procedures   DG Knee Complete 4 Views Right   DG Knee Complete 4 Views Left   Microalbumin / creatinine urine ratio   Hemoglobin A1c   Basic Metabolic Panel   Ambulatory referral to Podiatry   Ambulatory referral to Ophthalmology   Ambulatory referral to Nephrology   Ambulatory referral to Endocrinology     Requested Prescriptions   Signed Prescriptions Disp Refills   gabapentin  (NEURONTIN ) 300 MG capsule 90 capsule 0    Sig: Take 1 capsule (300 mg total) by mouth daily as needed.  triamcinolone  (KENALOG ) 0.025 % cream 60 g 2    Sig: Apply 1 Application topically 2 (two) times daily.   sitaGLIPtin  (JANUVIA ) 25 MG tablet 90 tablet 0    Sig: Take 1 tablet (25 mg total) by mouth daily.    Return in about 3 months (around 10/14/2024) for Follow-Up or next available chronic conditions.  Greig JINNY Drones, NP

## 2024-07-15 ENCOUNTER — Ambulatory Visit: Payer: Self-pay | Admitting: Family

## 2024-07-15 ENCOUNTER — Other Ambulatory Visit (HOSPITAL_COMMUNITY): Payer: Self-pay | Admitting: Internal Medicine

## 2024-07-15 DIAGNOSIS — M25561 Pain in right knee: Secondary | ICD-10-CM

## 2024-07-15 DIAGNOSIS — N1831 Chronic kidney disease, stage 3a: Secondary | ICD-10-CM

## 2024-07-15 DIAGNOSIS — E1165 Type 2 diabetes mellitus with hyperglycemia: Secondary | ICD-10-CM

## 2024-07-15 LAB — MICROALBUMIN / CREATININE URINE RATIO
Creatinine, Urine: 107 mg/dL
Microalb/Creat Ratio: 57 mg/g{creat} — ABNORMAL HIGH (ref 0–29)
Microalbumin, Urine: 60.6 ug/mL

## 2024-07-22 ENCOUNTER — Ambulatory Visit (HOSPITAL_BASED_OUTPATIENT_CLINIC_OR_DEPARTMENT_OTHER): Admitting: Student

## 2024-07-27 ENCOUNTER — Encounter: Payer: Self-pay | Admitting: Radiology

## 2024-07-28 NOTE — Progress Notes (Unsigned)
 New Patient Pulmonology Office Visit   Subjective:  Patient ID: Rachel [name], female    DOB: 1941-09-09  MRN: [id number]  Referred by: Lorren Greig PARAS, NP  CC: No chief complaint on file.   HPI Rachel [name] is a 83 y.o. female with nonobstructive CAD, atrial fibrillation, hypertension, asthma, diabetes who presents to establish care.  {PULM QUESTIONNAIRES (Optional):33196}  ROS  Allergies: Atorvastatin , Glimepiride , and Metformin  Current Outpatient Medications:    acetaminophen  (TYLENOL ) 500 MG tablet, Take 500 mg by mouth every 8 (eight) hours as needed for mild pain or headache., Disp: , Rfl:    albuterol  (VENTOLIN  HFA) 108 (90 Base) MCG/ACT inhaler, INHALE 1-2 PUFFS INTO THE LUNGS EVERY 6 (SIX) HOURS AS NEEDED FOR WHEEZING OR SHORTNESS OF BREATH. INHALE 1-2 PUFFS BY MOUTH EVERY 6 HOURS AS NEEDED FOR WHEEZE OR SHORTNESS OF BREATH, Disp: 18 each, Rfl: 2   benzonatate  (TESSALON ) 100 MG capsule, Take 1 capsule (100 mg total) by mouth 3 (three) times daily as needed for cough., Disp: 30 capsule, Rfl: 0   Blood Pressure KIT, Check blood pressure twice a day Dx: I10, Disp: 1 kit, Rfl: 0   budesonide  (PULMICORT ) 0.25 MG/2ML nebulizer solution, Take 2 mLs (0.25 mg total) by nebulization 2 (two) times daily., Disp: 60 mL, Rfl: 4   Continuous Glucose Receiver (FREESTYLE LIBRE 3 READER) DEVI, 1 each by Does not apply route 4 (four) times daily -  before meals and at bedtime., Disp: 1 each, Rfl: 2   Continuous Glucose Sensor (FREESTYLE LIBRE 3 PLUS SENSOR) MISC, USE 1 SENSOR AND CHANGE EVERY 15 DAYS TO CHECK BLOOD SUGAR 4 TIMES DAILY BEFORE MEALS AND BEDTIME., Disp: 2 each, Rfl: 2   diclofenac  Sodium (VOLTAREN ) 1 % GEL, Apply 2 g topically 4 (four) times daily as needed., Disp: 50 g, Rfl: 1   diltiazem  (CARDIZEM  CD) 180 MG 24 hr capsule, TAKE 1 CAPSULE BY MOUTH EVERY DAY, Disp: 90 capsule, Rfl: 0   ELIQUIS  5 MG TABS tablet, TAKE 1 TABLET BY MOUTH TWICE A DAY, Disp:  60 tablet, Rfl: 5   fluticasone  (FLONASE ) 50 MCG/ACT nasal spray, Place 2 sprays into both nostrils daily as needed for allergies or rhinitis. (Patient not taking: Reported on 04/08/2024), Disp: , Rfl:    gabapentin  (NEURONTIN ) 300 MG capsule, Take 1 capsule (300 mg total) by mouth daily as needed., Disp: 90 capsule, Rfl: 0   hydrALAZINE  (APRESOLINE ) 25 MG tablet, Take 1 tablet (25 mg total) by mouth 2 (two) times daily. Try to take 12 hours apart, Disp: 180 tablet, Rfl: 1   hydrALAZINE  (APRESOLINE ) 50 MG tablet, Take 50 mg by mouth 2 (two) times daily., Disp: , Rfl:    ipratropium (ATROVENT  HFA) 17 MCG/ACT inhaler, Inhale 1 puff into the lungs 3 (three) times daily., Disp: 12.9 g, Rfl: 2   ipratropium-albuterol  (DUONEB) 0.5-2.5 (3) MG/3ML SOLN, Take 3 mLs by nebulization every 4 (four) hours as needed., Disp: 75 mL, Rfl: 0   loratadine  (CLARITIN ) 10 MG tablet, TAKE 1 TABLET BY MOUTH EVERY DAY (DRUG NOT COVERED BY INS), Disp: 90 tablet, Rfl: 0   Multiple Vitamins-Minerals (CENTRUM SILVER 50+WOMEN) TABS, Take 1 tablet by mouth daily., Disp: , Rfl:    mupirocin  cream (BACTROBAN ) 2 %, APPLY TO AFFECTED AREA TWICE A DAY, Disp: 60 g, Rfl: 0   nystatin  (MYCOSTATIN /NYSTOP ) powder, Apply topically 4 (four) times daily., Disp: 60 g, Rfl: 0   predniSONE  (DELTASONE ) 20 MG tablet, Take 1 tablet (20 mg  total) by mouth daily with breakfast. (Patient not taking: Reported on 03/31/2024), Disp: 5 tablet, Rfl: 0   promethazine -dextromethorphan (PROMETHAZINE -DM) 6.25-15 MG/5ML syrup, Take 5 mLs by mouth at bedtime as needed for cough., Disp: 100 mL, Rfl: 0   rosuvastatin  (CRESTOR ) 5 MG tablet, Take 1 tablet (5 mg total) by mouth daily., Disp: 90 tablet, Rfl: 3   sitaGLIPtin  (JANUVIA ) 25 MG tablet, Take 1 tablet (25 mg total) by mouth daily., Disp: 90 tablet, Rfl: 0   sodium chloride  (OCEAN) 0.65 % SOLN nasal spray, Place 1 spray into both nostrils in the morning, at noon, in the evening, and at bedtime. (Patient not  taking: Reported on 03/31/2024), Disp: 30 mL, Rfl: 0   triamcinolone  (KENALOG ) 0.025 % cream, Apply 1 Application topically 2 (two) times daily., Disp: 60 g, Rfl: 2   valsartan -hydrochlorothiazide  (DIOVAN  HCT) 160-25 MG tablet, Take 1 tablet by mouth daily., Disp: 90 tablet, Rfl: 3 Past Medical History:  Diagnosis Date   (HFpEF) heart failure with preserved ejection fraction (HCC) 01/17/2023   Allergic rhinitis    CAD in native artery 07/17/2023   Diabetes mellitus, type 2 (HCC)    Hyperkalemia 08/14/2023   Hyperlipemia    Hypertension    Left shoulder pain 11/01/2021   Lipoma NEC    anterior upper chest   Osteoarthritis of knee    PAF (paroxysmal atrial fibrillation) (HCC) 02/24/2023   Shortness of breath 11/01/2021   Past Surgical History:  Procedure Laterality Date   ACNE CYST REMOVAL  1970   back   APPENDECTOMY  1963   CARDIOVERSION N/A 08/16/2023   Procedure: CARDIOVERSION (CATH LAB);  Surgeon: Barbaraann Darryle Ned, MD;  Location: [facility] INVASIVE CV LAB;  Service: Cardiovascular;  Laterality: N/A;   LEFT HEART CATH AND CORONARY ANGIOGRAPHY N/A 02/25/2023   Procedure: LEFT HEART CATH AND CORONARY ANGIOGRAPHY;  Surgeon: Verlin Lonni BIRCH, MD;  Location: [facility] INVASIVE CV LAB;  Service: Cardiovascular;  Laterality: N/A;   OOPHORECTOMY  1985   TRANSESOPHAGEAL ECHOCARDIOGRAM (CATH LAB) N/A 08/16/2023   Procedure: TRANSESOPHAGEAL ECHOCARDIOGRAM;  Surgeon: Barbaraann Darryle Ned, MD;  Location: [facility] INVASIVE CV LAB;  Service: Cardiovascular;  Laterality: N/A;   VAGINAL HYSTERECTOMY  1985   complete   Family History  Problem Relation Age of Onset   Diabetes Mother    Heart attack Mother    Cancer Father        brain   Diabetes Sister    Hypertension Sister    Diabetes Brother    Hypertension Brother    Coronary artery disease Brother    Aneurysm Other        niece   Social History   Socioeconomic History   Marital status: Widowed    Spouse name: Not on file   Number of  children: Not on file   Years of education: Not on file   Highest education level: Not on file  Occupational History   Occupation: department mang    Employer: [employer]    Comment: Retired  Tobacco Use   Smoking status: Never    Passive exposure: Never   Smokeless tobacco: Never   Tobacco comments:    Never smoked 08/21/23  Vaping Use   Vaping status: Never Used  Substance and Sexual Activity   Alcohol use: Yes    Alcohol/week: 1.0 standard drink of alcohol    Types: 1 Glasses of wine per week    Comment: ocassionally   Drug use: No   Sexual activity: Not  Currently  Other Topics Concern   Not on file  Social History Narrative   No regular exercise   Widowed   Social Drivers of Health   Financial Resource Strain: High Risk (09/05/2023)   Overall Financial Resource Strain (CARDIA)    Difficulty of Paying Living Expenses: Hard  Food Insecurity: No Food Insecurity (11/25/2023)   Hunger Vital Sign    Worried About Running Out of Food in the Last Year: Never true    Ran Out of Food in the Last Year: Never true  Recent Concern: Food Insecurity - Food Insecurity Present (10/24/2023)   Hunger Vital Sign    Worried About Running Out of Food in the Last Year: Sometimes true    Ran Out of Food in the Last Year: Sometimes true  Transportation Needs: No Transportation Needs (11/25/2023)   PRAPARE - Administrator, Civil Service (Medical): No    Lack of Transportation (Non-Medical): No  Physical Activity: Inactive (09/05/2023)   Exercise Vital Sign    Days of Exercise per Week: 0 days    Minutes of Exercise per Session: 0 min  Stress: Not on file  Social Connections: Not on file  Intimate Partner Violence: Not At Risk (11/25/2023)   Humiliation, Afraid, Rape, and Kick questionnaire    Fear of Current or Ex-Partner: No    Emotionally Abused: No    Physically Abused: No    Sexually Abused: No       Objective:  There were no vitals taken for this visit. {Pulm  Vitals (Optional):32837}  Physical Exam  Diagnostic Review:  {Labs (Optional):32838}  AEC 100 - 200 cells/uL      Assessment & Plan:   Assessment & Plan   No orders of the defined types were placed in this encounter.     No follow-ups on file.   Mahlon Gabrielle, MD

## 2024-07-29 ENCOUNTER — Encounter (HOSPITAL_BASED_OUTPATIENT_CLINIC_OR_DEPARTMENT_OTHER): Payer: Self-pay | Admitting: Pulmonary Disease

## 2024-07-29 ENCOUNTER — Ambulatory Visit (INDEPENDENT_AMBULATORY_CARE_PROVIDER_SITE_OTHER): Admitting: Pulmonary Disease

## 2024-07-29 ENCOUNTER — Ambulatory Visit (INDEPENDENT_AMBULATORY_CARE_PROVIDER_SITE_OTHER)

## 2024-07-29 VITALS — BP 182/59 | HR 59 | Ht 63.0 in | Wt 199.5 lb

## 2024-07-29 DIAGNOSIS — R053 Chronic cough: Secondary | ICD-10-CM

## 2024-07-29 DIAGNOSIS — R059 Cough, unspecified: Secondary | ICD-10-CM | POA: Diagnosis not present

## 2024-07-29 DIAGNOSIS — K219 Gastro-esophageal reflux disease without esophagitis: Secondary | ICD-10-CM | POA: Diagnosis not present

## 2024-07-29 DIAGNOSIS — Z23 Encounter for immunization: Secondary | ICD-10-CM | POA: Diagnosis not present

## 2024-07-29 DIAGNOSIS — J302 Other seasonal allergic rhinitis: Secondary | ICD-10-CM | POA: Diagnosis not present

## 2024-07-29 DIAGNOSIS — E1165 Type 2 diabetes mellitus with hyperglycemia: Secondary | ICD-10-CM | POA: Diagnosis not present

## 2024-07-29 DIAGNOSIS — G4730 Sleep apnea, unspecified: Secondary | ICD-10-CM

## 2024-07-29 DIAGNOSIS — N189 Chronic kidney disease, unspecified: Secondary | ICD-10-CM | POA: Diagnosis not present

## 2024-07-29 MED ORDER — AZELASTINE HCL 0.1 % NA SOLN: 2.0000 | Freq: Every day | NASAL | 12 refills | Status: AC

## 2024-07-29 MED ORDER — FAMOTIDINE 20 MG PO TABS: 20.0000 mg | ORAL_TABLET | Freq: Two times a day (BID) | ORAL | 3 refills | Status: DC

## 2024-07-29 NOTE — Assessment & Plan Note (Signed)
 Symptoms include postnasal drip and cough triggered by odors and allergens. Afrin nasal spray not recommended due to rebound congestion risk. - Prescribe azelastine nasal spray, 2 spray in each nostril before bed. - Advise against using Afrin nasal spray for > 4 days in a row.

## 2024-07-29 NOTE — Patient Instructions (Signed)
  VISIT SUMMARY: During your visit, we discussed your nocturnal coughing and choking episodes, which may be related to sleep apnea. We also addressed your allergic rhinitis and potential GERD symptoms.  YOUR PLAN: CHRONIC NOCTURNAL COUGH AND SUSPECTED SLEEP APNEA: Your coughing and choking at night, especially when lying on your back, may be due to sleep apnea. This condition can also cause daytime fatigue and non-restorative sleep. -We will schedule a sleep study at [facility] in [place] to further investigate this. -A chest x-ray will be done to check for any other issues. -Continue using your albuterol  inhaler, 2 puffs every 4 hours as needed for coughing or wheezing.  ALLERGIC RHINITIS: Your symptoms of postnasal drip and cough triggered by odors and allergens suggest allergic rhinitis. -We are prescribing azelastine nasal spray. Use 1 spray in each nostril before bed. -Avoid using Afrin nasal spray to prevent rebound congestion.  GASTROESOPHAGEAL REFLUX DISEASE (GERD): Although you are not currently experiencing reflux symptoms, we want to manage any potential GERD that could be contributing to your cough. -We are prescribing heartburn medication to help manage GERD symptoms.   Contains text generated by [employer].

## 2024-07-29 NOTE — Assessment & Plan Note (Signed)
 Unclear etiology but occurs mostly at night. I suspect GERD maybe a culprit versus coughing/chocking due to apnea episodes. I have ordered famotidine  to treat GERD and ordered PSG to rule out sleep apnea. We will also order CXR to rule out parenchyma lung abnormalities and PFTs to look for airways disease. The patient is not on ACE-I.

## 2024-07-30 ENCOUNTER — Telehealth: Payer: Self-pay

## 2024-07-30 LAB — BASIC METABOLIC PANEL WITH GFR
BUN/Creatinine Ratio: 21 (ref 12–28)
BUN: 24 mg/dL (ref 8–27)
CO2: 19 mmol/L — ABNORMAL LOW (ref 20–29)
Calcium: 9.8 mg/dL (ref 8.7–10.3)
Chloride: 105 mmol/L (ref 96–106)
Creatinine, Ser: 1.15 mg/dL — ABNORMAL HIGH (ref 0.57–1.00)
Glucose: 161 mg/dL — ABNORMAL HIGH (ref 70–99)
Potassium: 5.2 mmol/L (ref 3.5–5.2)
Sodium: 142 mmol/L (ref 134–144)
eGFR: 48 mL/min/1.73 — ABNORMAL LOW (ref >59)

## 2024-07-30 LAB — HEMOGLOBIN A1C
Est. average glucose Bld gHb Est-mCnc: 194 mg/dL
Hgb A1c MFr Bld: 8.4 % — ABNORMAL HIGH (ref 4.8–5.6)

## 2024-07-30 MED ORDER — SITAGLIPTIN 50 MG PO TABS: 50.0000 mg | ORAL_TABLET | Freq: Every day | ORAL | 0 refills | Status: DC

## 2024-07-30 NOTE — Telephone Encounter (Signed)
 Copied from CRM [id number]. Topic: Clinical - Lab/Test Results >> Jul 30, 2024  1:33 PM Avram MATSU wrote: Reason for CRM: I relayed the test results to the pt, she would like more information please advise [phone] (M)

## 2024-07-31 ENCOUNTER — Telehealth: Payer: Self-pay

## 2024-07-31 NOTE — Progress Notes (Signed)
 t

## 2024-07-31 NOTE — Telephone Encounter (Signed)
 I have called and already spoke with patient today

## 2024-07-31 NOTE — Telephone Encounter (Signed)
 I returned patient call and went over her lab results

## 2024-07-31 NOTE — Telephone Encounter (Signed)
 Copied from CRM #[id number]. Topic: Clinical - Lab/Test Results >> Jul 31, 2024 10:15 AM Tonda B wrote: Reason for CRM: patient is calling missed a call from office please pt back [phone] (M)

## 2024-08-03 ENCOUNTER — Ambulatory Visit: Payer: Self-pay | Admitting: Pulmonary Disease

## 2024-08-12 ENCOUNTER — Ambulatory Visit (HOSPITAL_BASED_OUTPATIENT_CLINIC_OR_DEPARTMENT_OTHER): Admitting: Cardiovascular Disease

## 2024-08-12 ENCOUNTER — Encounter (HOSPITAL_BASED_OUTPATIENT_CLINIC_OR_DEPARTMENT_OTHER): Payer: Self-pay | Admitting: Cardiovascular Disease

## 2024-08-12 VITALS — BP 164/64 | HR 57 | Ht 63.0 in | Wt 199.6 lb

## 2024-08-12 DIAGNOSIS — I10 Essential (primary) hypertension: Secondary | ICD-10-CM

## 2024-08-12 DIAGNOSIS — I2510 Atherosclerotic heart disease of native coronary artery without angina pectoris: Secondary | ICD-10-CM

## 2024-08-12 DIAGNOSIS — I5033 Acute on chronic diastolic (congestive) heart failure: Secondary | ICD-10-CM | POA: Diagnosis not present

## 2024-08-12 DIAGNOSIS — I480 Paroxysmal atrial fibrillation: Secondary | ICD-10-CM | POA: Diagnosis not present

## 2024-08-12 DIAGNOSIS — R300 Dysuria: Secondary | ICD-10-CM | POA: Diagnosis not present

## 2024-08-12 DIAGNOSIS — E7800 Pure hypercholesterolemia, unspecified: Secondary | ICD-10-CM

## 2024-08-12 NOTE — Progress Notes (Signed)
 [facility] Follow-up:    Date:  08/12/2024   ID:  Rachel [name], DOB 04-Apr-1941, MRN [id number]  PCP:  Rachel Greig PARAS, NP  Cardiologist:  Rachel Scarce, MD   Referring MD: Rachel Greig PARAS, NP   CC: Hypertension  History of Present Illness:    Rachel [name] is a 83 y.o. female with a hx of nonobstructive CAD, atrial fibrillation, hypertension, asthma, diabetes, here for follow-up. She was initially seen 11/01/2021 in the [facility]. She had a televisit 06/2021 and her blood pressure was 157/87 on amlodipine , losartan , and HCTZ. Amlodipine  was increased to 10 mg. She previously had an Echo in 2019 which showed LVEF 65-70% and grade 1 diastolic dysfunction.  At her initial visit, she reported home blood pressures averaging 160-187 systolic prior to taking her medications. She had mildly elevated neck veins on exam. She had no symptoms of pheochromocytoma. Given that she was on ARB we did not check for hyperaldosteronism. We restarted her on 5 mg amlodipine  daily (previously intolerant of 10 mg dose but was stable on 5 mg). Losartan /HCTZ was switched to valsartan /HCTZ 320/25 mg daily. She was also started on spironolactone  25 mg daily. She was referred to PREP. Renal artery doppler and echocardiogram were ordered but not completed. Later she reported significant LE swelling, so amlodipine  was reduced from 5 mg to 2.5 mg daily. On 01/13/2023 she presented to the ED with complaints of worsening DOE for several days in the setting of months of leg swelling. Noted to be hypertensive in triage but otherwise without distress. BNP was normal. She was discharged on 20 mg furosemide  BID.    She was seen 12/2022 and her home blood pressures were averaging 165-167 systolic. This was similar to her BP in the office. There had been some confusion with her medications and some had been lost from her regime. We discontinued hospital-prescribed diuretics  and restarted amlodipine , spironolactone , and valsartan /HCTZ. She had been hesitant to refill her atorvastatin  after hearing about potential side effects. She was encouraged to restart her cholesterol medication.  From 6/2-02/27/2023 she was admitted after presenting with syncope and new onset atrial fibrillation with RVR. She also complained of chest pain and troponins were elevated, felt probably due to demand ischemia. She underwent left heart catheterization 02/25/23 showing mild nonobstructive CAD (prox RCA 20%, ost-proxCx 20%, ost-prox LAD 20%, mid LAD 20%). Echo during admission showed LVEF 65 to 70%, no RWMA, normal RV function, no significant valvular abnormalities. She reverted to sinus bradycardia with PO diltiazem  and Toprol  prior to discharge. Spironolactone  had been reduced and amlodipine  was held. She followed up with Rachel Finder, NP 03/08/23 and noted home blood pressures in systolic 160s-170s although she was checking prior to taking her medications. In the office her BP was 136/86. She was maintaining sinus bradycardia and denied palpitations. Noted to be intolerant of atorvastatin  20 mg and she was switched to 5 mg rosuvastatin  daily.  At her visit 06/2023 she noted feeling fatigued and having increased exertional dyspnea.  Blood pressures were uncontrolled.  She had an episode of syncope at which time her blood pressure was almost 200 systolic.  Spironolactone  was increased.  She was referred to the PREP program at the [facility].  She was admitted to the hospital 07/2023 with atrial flutter with RVR and syncope.  She was transition to diltiazem .  She underwent TEE/DCCV given that she had missed a dose of Eliquis .  Interestingly, blood pressures were well-controlled in the hospital on  diltiazem  only despite having uncontrolled blood pressures at home.  Valsartan /HCT was added back prior to discharge from the hospital.  She was seen in A-fib clinic 07/2023 and remained in sinus rhythm.  She noticed  ankle swelling since starting diltiazem .  They discussed rhythm control options.  She was given Lasix  and has not yet followed up with them.  At her visit 10/2023 BP was in the 170s-180s.  Valsartan  was restarted. At her visit 01/2024 blood pressure remained uncontrolled.  She was not getting formal exercise but was active.  Hydralazine  was added to her regimen.  She struggled with affording Eliquis .  At her visit 03/2024 blood pressures were averaging in the 140s to 150s systolic.  Her renal function had improved.  Hydralazine  was increased.  However the dose was subsequently reduced back to 25 mg due to palpitations.  She was encouraged to increase her exercise.  She struggled with the cost of Eliquis  and was provided some samples.  Discussed the use of AI scribe software for clinical note transcription with the patient, who gave verbal consent to proceed.  History of Present Illness Rachel [name] has hypertension with blood pressure readings around 140 mmHg. She experienced palpitations with a higher dose of hydralazine , so she returned to a 25 mg dose. Her heart rate is in the 50s, and she is currently on verapamil , which helps manage her blood pressure and heart rate.  She has type 2 diabetes with a recent A1c of 8, improved from a previous level of 10.2. She is actively engaging in physical activity, including using a pound machine while watching TV, to help control her diabetes.  She is concerned about her kidney function, as her microalbumin to creatinine ratio is 57, improved from 197 a year ago. Her GFR is 48, which is also better than last year. She has not yet made an appointment with a kidney specialist.  She is on Eliquis  for atrial fibrillation and rosuvastatin  for cholesterol management. Her LDL has improved from 145 to 84, though it is not yet at the target level of under 70.  She has a new symptom of burning sensation after urination, which is concerning for a possible urinary tract  infection. She plans to provide a urine sample for further evaluation.  She received a pneumonia shot recently but declined the flu shot. She was advised by her lung doctor to undergo a sleep study due to concerns about possible sleep apnea, as she has been experiencing a persistent cough and seasonal allergies. She has not yet been contacted to schedule the sleep study.  She was prescribed medication for acid reflux and a nasal spray for her symptoms, but she has not used the acid reflux medication as she does not feel she has heartburn.   Previous antihypertensives: Amlodipine -increased edema at higher doses SPIRONOLACTONE - hyperkalemia  Past Medical History:  Diagnosis Date   (HFpEF) heart failure with preserved ejection fraction (HCC) 01/17/2023   Allergic rhinitis    CAD in native artery 07/17/2023   Diabetes mellitus, type 2 (HCC)    Hyperkalemia 08/14/2023   Hyperlipemia    Hypertension    Left shoulder pain 11/01/2021   Lipoma NEC    anterior upper chest   Osteoarthritis of knee    PAF (paroxysmal atrial fibrillation) (HCC) 02/24/2023   Shortness of breath 11/01/2021    Past Surgical History:  Procedure Laterality Date   ACNE CYST REMOVAL  1970   back   APPENDECTOMY  1963   CARDIOVERSION N/A  08/16/2023   Procedure: CARDIOVERSION (CATH LAB);  Surgeon: Barbaraann Darryle Ned, MD;  Location: [facility] INVASIVE CV LAB;  Service: Cardiovascular;  Laterality: N/A;   LEFT HEART CATH AND CORONARY ANGIOGRAPHY N/A 02/25/2023   Procedure: LEFT HEART CATH AND CORONARY ANGIOGRAPHY;  Surgeon: Verlin Lonni BIRCH, MD;  Location: [facility] INVASIVE CV LAB;  Service: Cardiovascular;  Laterality: N/A;   OOPHORECTOMY  1985   TRANSESOPHAGEAL ECHOCARDIOGRAM (CATH LAB) N/A 08/16/2023   Procedure: TRANSESOPHAGEAL ECHOCARDIOGRAM;  Surgeon: Barbaraann Darryle Ned, MD;  Location[facility] INVASIVE CV LAB;  Service: Cardiovascular;  Laterality: N/A;   VAGINAL HYSTERECTOMY  1985   complete    Current  Medications: Current Meds  Medication Sig   acetaminophen  (TYLENOL ) 500 MG tablet Take 500 mg by mouth every 8 (eight) hours as needed for mild pain or headache.   albuterol  (VENTOLIN  HFA) 108 (90 Base) MCG/ACT inhaler INHALE 1-2 PUFFS INTO THE LUNGS EVERY 6 (SIX) HOURS AS NEEDED FOR WHEEZING OR SHORTNESS OF BREATH. INHALE 1-2 PUFFS BY MOUTH EVERY 6 HOURS AS NEEDED FOR WHEEZE OR SHORTNESS OF BREATH   azelastine  (ASTELIN ) 0.1 % nasal spray Place 2 sprays into both nostrils at bedtime. Use in each nostril as directed   benzonatate  (TESSALON ) 100 MG capsule Take 1 capsule (100 mg total) by mouth 3 (three) times daily as needed for cough.   Blood Pressure KIT Check blood pressure twice a day Dx: I10   budesonide  (PULMICORT ) 0.25 MG/2ML nebulizer solution Take 2 mLs (0.25 mg total) by nebulization 2 (two) times daily.   Continuous Glucose Receiver (FREESTYLE LIBRE 3 READER) DEVI 1 each by Does not apply route 4 (four) times daily -  before meals and at bedtime.   Continuous Glucose Sensor (FREESTYLE LIBRE 3 PLUS SENSOR) MISC USE 1 SENSOR AND CHANGE EVERY 15 DAYS TO CHECK BLOOD SUGAR 4 TIMES DAILY BEFORE MEALS AND BEDTIME.   diclofenac  Sodium (VOLTAREN ) 1 % GEL Apply 2 g topically 4 (four) times daily as needed.   diltiazem  (CARDIZEM  CD) 180 MG 24 hr capsule TAKE 1 CAPSULE BY MOUTH EVERY DAY   ELIQUIS  5 MG TABS tablet TAKE 1 TABLET BY MOUTH TWICE A DAY   famotidine  (PEPCID ) 20 MG tablet Take 1 tablet (20 mg total) by mouth 2 (two) times daily.   hydrALAZINE  (APRESOLINE ) 25 MG tablet Take 1 tablet (25 mg total) by mouth 2 (two) times daily. Try to take 12 hours apart   ipratropium (ATROVENT  HFA) 17 MCG/ACT inhaler Inhale 1 puff into the lungs 3 (three) times daily.   ipratropium-albuterol  (DUONEB) 0.5-2.5 (3) MG/3ML SOLN Take 3 mLs by nebulization every 4 (four) hours as needed.   loratadine  (CLARITIN ) 10 MG tablet TAKE 1 TABLET BY MOUTH EVERY DAY (DRUG NOT COVERED BY INS)   Multiple Vitamins-Minerals  (CENTRUM SILVER 50+WOMEN) TABS Take 1 tablet by mouth daily.   promethazine -dextromethorphan (PROMETHAZINE -DM) 6.25-15 MG/5ML syrup Take 5 mLs by mouth at bedtime as needed for cough.   rosuvastatin  (CRESTOR ) 5 MG tablet Take 1 tablet (5 mg total) by mouth daily.   sodium chloride  (OCEAN) 0.65 % SOLN nasal spray Place 1 spray into both nostrils in the morning, at noon, in the evening, and at bedtime.   triamcinolone  (KENALOG ) 0.025 % cream Apply 1 Application topically 2 (two) times daily.   valsartan -hydrochlorothiazide  (DIOVAN  HCT) 160-25 MG tablet Take 1 tablet by mouth daily.     Allergies:   Atorvastatin , Glimepiride , and Metformin   Social History   Socioeconomic History   Marital status: Widowed  Spouse name: Not on file   Number of children: Not on file   Years of education: Not on file   Highest education level: Not on file  Occupational History   Occupation: department mang    Employer: [employer]    Comment: Retired  Tobacco Use   Smoking status: Never    Passive exposure: Never   Smokeless tobacco: Never   Tobacco comments:    Never smoked 08/21/23  Vaping Use   Vaping status: Never Used  Substance and Sexual Activity   Alcohol use: Yes    Alcohol/week: 1.0 standard drink of alcohol    Types: 1 Glasses of wine per week    Comment: ocassionally   Drug use: No   Sexual activity: Not Currently  Other Topics Concern   Not on file  Social History Narrative   No regular exercise   Widowed   Social Drivers of Health   Financial Resource Strain: High Risk (09/05/2023)   Overall Financial Resource Strain (CARDIA)    Difficulty of Paying Living Expenses: Hard  Food Insecurity: No Food Insecurity (11/25/2023)   Hunger Vital Sign    Worried About Running Out of Food in the Last Year: Never true    Ran Out of Food in the Last Year: Never true  Recent Concern: Food Insecurity - Food Insecurity Present (10/24/2023)   Hunger Vital Sign    Worried About Running  Out of Food in the Last Year: Sometimes true    Ran Out of Food in the Last Year: Sometimes true  Transportation Needs: No Transportation Needs (11/25/2023)   PRAPARE - Administrator, Civil Service (Medical): No    Lack of Transportation (Non-Medical): No  Physical Activity: Inactive (09/05/2023)   Exercise Vital Sign    Days of Exercise per Week: 0 days    Minutes of Exercise per Session: 0 min  Stress: Not on file  Social Connections: Not on file     Family History: The patient's family history includes Aneurysm in an other family member; Cancer in her father; Coronary artery disease in her brother; Diabetes in her brother, mother, and sister; Heart attack in her mother; Hypertension in her brother and sister.  ROS:   Please see the history of present illness.    (+) Fatigue (+) Occasional palpitations (+) Soreness of right hand All other systems reviewed and are negative.  EKGs/Labs/Other Studies Reviewed:    Left Heart Cath  02/25/2023:   Prox RCA lesion is 20% stenosed.   Ost Cx to Prox Cx lesion is 20% stenosed.   Ost LAD to Prox LAD lesion is 20% stenosed.   Mid LAD lesion is 20% stenosed.   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 Her troponin elevation is likely due to demand ischemia in the setting of atrial fib with RVR.   Echo  02/25/2023:  1. Left ventricular ejection fraction, by estimation, is 65 to 70%. The  left ventricle has normal function. The left ventricle has no regional  wall motion abnormalities. There is mild concentric left ventricular  hypertrophy. Left ventricular diastolic  function could not be evaluated.   2. Right ventricular systolic function is normal. The right ventricular  size is normal.   3. Left atrial size was mildly dilated.   4. The mitral valve is normal in structure. Trivial mitral valve  regurgitation. No evidence of mitral stenosis.   5. The aortic valve is  normal in structure. Aortic valve regurgitation is  not visualized. No aortic stenosis is present.   6. The inferior vena cava is normal in size with greater than 50%  respiratory variability, suggesting right atrial pressure of 3 mmHg.   Chest X-Ray  01/13/2023: IMPRESSION: 1. Mild cardiomegaly and increased central vascular prominence without overt edema. 2. No other evidence for acute chest process. Linear scarring or atelectasis left mid field.  Echo 01/09/2018: Study Conclusions  - Left ventricle: Wall thickness was increased in a pattern of    moderate LVH. Systolic function was vigorous. The estimated    ejection fraction was in the range of 65% to 70%. Wall motion was    normal; there were no regional wall motion abnormalities. Doppler    parameters are consistent with abnormal left ventricular    relaxation (grade 1 diastolic dysfunction).  - Aortic valve: Peak gradient 18 mmHg across the aortic valve    likely represents LV outflow tract gradient rather than valvular    AS, the valve appears to open well. Peak gradient (S): 18 mm Hg.  - Mitral valve: There was mild regurgitation.  - Right ventricle: The cavity size was normal. Systolic function    was normal.  - Tricuspid valve: Peak RV-RA gradient (S): 37 mm Hg.  - Pulmonary arteries: PA peak pressure: 40 mm Hg (S).  - Inferior vena cava: The vessel was normal in size. The    respirophasic diameter changes were in the normal range (>= 50%),    consistent with normal central venous pressure.   Impressions:  - Normal LV size with moderate LV hypertrophy. EF 65-70%, vigorous    systolic function. Normal RV size and systolic function. Mild    gradient across the LV outflow tract is likely due to    LVH/vigorous LV systolic function rather than valvular AS.   EKG:  EKG is personally reviewed. 07/17/2023:  Not ordered. 01/17/2023:  EKG was not ordered. 11/01/2021: Sinus rhythm. Rate 66 bpm.  Recent Labs: 08/13/2023: B  Natriuretic Peptide 171.2; TSH 3.369 08/14/2023: Hemoglobin 11.5; Platelets 301 03/04/2024: ALT 11 07/29/2024: BUN 24; Creatinine, Ser 1.15; Potassium 5.2; Sodium 142   Recent Lipid Panel    Component Value Date/Time   CHOL 153 03/04/2024 0920   TRIG 136 03/04/2024 0920   HDL 45 03/04/2024 0920   CHOLHDL 3.4 03/04/2024 0920   CHOLHDL 5 01/12/2020 1350   VLDL 45.2 (H) 01/12/2020 1350   LDLCALC 84 03/04/2024 0920   LDLDIRECT 139.0 01/12/2020 1350    Physical Exam:    VS:  BP (!) 164/64   Pulse (!) 57   Ht 5' 3 (1.6 m)   Wt 199 lb 9.6 oz (90.5 kg)   SpO2 98%   BMI 35.36 kg/m  , BMI Body mass index is 35.36 kg/m. GENERAL:  Well appearing HEENT: Pupils equal round and reactive, fundi not visualized, oral mucosa unremarkable NECK: No JVP.  Waveform within normal limits, carotid upstroke brisk and symmetric, no bruits, no thyromegaly LUNGS:  Clear to auscultation bilaterally HEART:  RRR.  PMI not displaced or sustained,S1 and S2 within normal limits, no S3, no S4, no clicks, no rubs, no murmurs ABD:  Flat, positive bowel sounds normal in frequency in pitch, no bruits, no rebound, no guarding, no midline pulsatile mass, no hepatomegaly, no splenomegaly EXT:  2 plus pulses throughout, no LE edema bilaterally, no cyanosis no clubbing SKIN:  No rashes no nodules NEURO:  Cranial nerves II through XII grossly intact,  motor grossly intact throughout PSYCH:  Cognitively intact, oriented to person place and time  ASSESSMENT/PLAN:    Assessment & Plan # Primary hypertension Blood pressure elevated at 154/58 mmHg. Current regimen includes hydralazine  and verapamil , limited by bradycardia. - Continue current antihypertensive regimen.  Diltiazem , hydralazine , and valsartan /hydrochlorothiazide .  - Encouraged regular exercise and dietary modifications to reduce salt intake. - Reassess blood pressure in four months.  # Type 2 diabetes mellitus A1c improved to 8.0% from 10.2%. Discussed GLP-1  receptor agonists and SGLT2 inhibitors for weight loss, A1c control, and renal/cardiovascular benefits. - Consider GLP-1 receptor agonists for weight loss and A1c control. - Discuss SGLT2 inhibitors with nephrology and primary care for potential renal and cardiovascular benefits.  # Chronic kidney disease stage 3a/b Stage 3a kidney function with GFR of 48 mL/min/1.73 m2. Microalbuminuria improved to 57 mg/g. - PCP referred to nephrology for further evaluation and management. - Continue monitoring kidney function and proteinuria. - Consdier SGLT2i as above. - Continue ARB  # Paroxysmal atrial fibrillation Currently in normal sinus rhythm. Verapamil  used for rate control during AFib episodes, limited by bradycardia. - Continue verapamil  for rate control during AFib episodes.  # HFpEF: Volume stable.  BP management as above.  Managed with lifestyle modifications and medication adjustments.  # Atherosclerotic heart disease of native coronary artery without angina No current angina symptoms. Managed with lifestyle modifications and medication. - Continue current management and monitor for symptoms.  # Hypercholesterolemia LDL improved to 84 mg/dL from 854 mg/dL, above target of <29 mg/dL. - Continue rosuvastatin  therapy. - Encouraged regular exercise to further improve lipid profile. - Repeat fasting lipid panel one week before next appointment.  # Urinary symptoms, possible urinary tract infection Reports burning sensation after urination, suggestive of possible UTI. - Obtained urine sample for urinalysis to evaluate for UTI. - Discuss potential use of SGLT2 inhibitors with nephrology and primary care.    Screening for Secondary Hypertension:     11/01/2021   10:31 AM  Causes  Drugs/Herbals Screened     - Comments Limits salt and caffeine.  No EtOH.  Rare NSAID use.  Renovascular HTN Screened     - Comments Check renal artery Dopplers  Sleep Apnea Screened     - Comments  Snoring and orthopnea.  No other symptoms.  We will get an echo and if it is unremarkable consider screening for sleep study.  Thyroid  Disease Screened     - Comments Check TSH  Hyperaldosteronism Not Screened     - Comments Currently on ARB.  Therefore will not start at this time.  Pheochromocytoma Screened     - Comments No symptoms  Cushing's Syndrome Not Screened  Hyperparathyroidism Screened  Coarctation of the Aorta Screened     - Comments BP symmetric  Compliance Screened    Relevant Labs/Studies:    Latest Ref Rng & Units 07/29/2024    3:25 PM 03/04/2024    9:20 AM 12/03/2023    1:05 PM  Basic Labs  Sodium 134 - 144 mmol/L 142  141  138   Potassium 3.5 - 5.2 mmol/L 5.2  5.0  5.4   Creatinine 0.57 - 1.00 mg/dL 8.84  8.79  8.77        Latest Ref Rng & Units 08/13/2023    1:28 PM 02/24/2023   12:36 PM  Thyroid    TSH 0.350 - 4.500 uIU/mL 3.369  1.996     Disposition:    FU with Fredick Schlosser C. Raford, MD, [facility] in  4 months.  Medication Adjustments/Labs and Tests Ordered: Current medicines are reviewed at length with the patient today.  Concerns regarding medicines are outlined above.   Orders Placed This Encounter  Procedures   Urinalysis   Lipid panel   Comprehensive metabolic panel with GFR   EKG 87-Ozji   No orders of the defined types were placed in this encounter.   Signed, Rachel Scarce, MD  08/12/2024 12:48 PM    [facility]

## 2024-08-12 NOTE — Patient Instructions (Addendum)
 Medication Instructions:  Your physician recommends that you continue on your current medications as directed. Please refer to the Current Medication list given to you today.  Labwork: URINALYSIS SOON   FASTING LP/CMET 1 WEEK PRIOR TO FOLLOW UP   Testing/Procedures: NONE  Follow-Up: 4 MONTHS WITH CAITLIN W OR DR Allen   If you need a refill on your cardiac medications before your next appointment, please call your pharmacy.  INCREASE YOUR EXERCISE

## 2024-09-07 LAB — URINALYSIS
Bilirubin, UA: NEGATIVE
Glucose, UA: NEGATIVE
Ketones, UA: NEGATIVE
Nitrite, UA: NEGATIVE
RBC, UA: NEGATIVE
Specific Gravity, UA: 1.019 (ref 1.005–1.030)
Urobilinogen, Ur: 1 mg/dL (ref 0.2–1.0)
pH, UA: 5.5 (ref 5.0–7.5)

## 2024-09-18 ENCOUNTER — Ambulatory Visit: Admission: EM | Admit: 2024-09-18 | Discharge: 2024-09-18 | Disposition: A | Discharge status: Home / Self Care

## 2024-09-18 ENCOUNTER — Encounter: Payer: Self-pay | Admitting: Emergency Medicine

## 2024-09-18 ENCOUNTER — Ambulatory Visit: Payer: Self-pay

## 2024-09-18 DIAGNOSIS — J069 Acute upper respiratory infection, unspecified: Secondary | ICD-10-CM

## 2024-09-18 LAB — POCT INFLUENZA A/B
Influenza A, POC: NEGATIVE
Influenza B, POC: NEGATIVE

## 2024-09-18 MED ORDER — PROMETHAZINE-DM 6.25-15 MG/5ML PO SYRP: 5.0000 mL | ORAL_SOLUTION | Freq: Four times a day (QID) | ORAL | 0 refills | Status: AC | PRN

## 2024-09-18 NOTE — Discharge Instructions (Signed)

## 2024-09-18 NOTE — Telephone Encounter (Signed)
 FYI Only or Action Required?: FYI only for provider: UC.  Patient was last seen in primary care on 07/14/2024 by Jaycee Greig PARAS, NP.  Called Nurse Triage reporting Cough.  Symptoms began yesterday.  Interventions attempted: Nothing.  Symptoms are: unchanged.  Triage Disposition: See Physician Within 24 Hours  Patient/caregiver understands and will follow disposition?: Yes   Copied from CRM #[id number]. Topic: Clinical - Red Word Triage >> Sep 18, 2024  2:23 PM Gustabo D wrote: Pt woke up in the night coughing and says its in her chest and it hurts. And she's a heart pt. Reason for Disposition  SEVERE coughing spells (e.g., whooping sound after coughing, vomiting after coughing)  Answer Assessment - Initial Assessment Questions 1. ONSET: When did the cough begin?       Last night  2. SEVERITY: How bad is the cough today?       Same since onset  3. SPUTUM: Describe the color of your sputum (e.g., none, dry cough; clear, white, yellow, green)     Dry  4. HEMOPTYSIS: Are you coughing up any blood? If Yes, ask: How much? (e.g., flecks, streaks, tablespoons, etc.)      Denies  5. DIFFICULTY BREATHING: Are you having difficulty breathing? If Yes, ask: How bad is it? (e.g., mild, moderate, severe)       denies  6. FEVER: Do you have a fever? If Yes, ask: What is your temperature, how was it measured, and when did it start?      denies  7. CARDIAC HISTORY: Do you have any history of heart disease? (e.g., heart attack, congestive heart failure)       Per pt's chart, afib, HTN, HF  8. LUNG HISTORY: Do you have any history of lung disease?  (e.g., pulmonary embolus, asthma, emphysema)      Per pt's chart, asthma and bronchitis  9. PE RISK FACTORS: Do you have a history of blood clots? (or: recent major surgery, recent prolonged travel, bedridden)      Per pt's chart, pt does not have PE risk factors  10. OTHER SYMPTOMS: chest pain due to cough and runny  nose   Pt reports cough and runny nose Pt advised to go to UC for further evaluation and treatment due to lack of availability in clinic until Tuesday, 12.30.25. Pt agrees with plan of care, will call back for any worsening symptoms  Protocols used: Cough - Acute Productive-A-AH

## 2024-09-18 NOTE — ED Triage Notes (Signed)
 Pt presents c/o URI x 1 day. Pt states,  My nose is running. Around midnight last night I started coughing. I got A Fib. What ever it is in here I picked it up yesterday. I feel terrible and I'm sore from coughing.  Pt denies emesis and diarrhea.

## 2024-09-18 NOTE — ED Provider Notes (Signed)
 " [facility]    CSN: [id number] Arrival date & time: 09/18/24  1509      History   Chief Complaint Chief Complaint  Patient presents with   URI    HPI Rachel [name] is a 83 y.o. female.   Pt presents today due to nasal congestion and cough that started at 12 am. Pt states that she is experiencing central chest pain for persistent coughing. Pt denies dizziness, shortness of breath, or palpitations. Pt denies use of anything for symptoms but is concerned that she will cough herself into A. Fib so she would like to be prescribed something for cough.   The history is provided by the patient.  URI   Past Medical History:  Diagnosis Date   (HFpEF) heart failure with preserved ejection fraction (HCC) 01/17/2023   Allergic rhinitis    CAD in native artery 07/17/2023   Diabetes mellitus, type 2 (HCC)    Hyperkalemia 08/14/2023   Hyperlipemia    Hypertension    Left shoulder pain 11/01/2021   Lipoma NEC    anterior upper chest   Osteoarthritis of knee    PAF (paroxysmal atrial fibrillation) (HCC) 02/24/2023   Shortness of breath 11/01/2021    Patient Active Problem List   Diagnosis Date Noted   Typical atrial flutter (HCC) 08/16/2023   Acute kidney injury 08/15/2023   CAD in native artery 07/17/2023   Epistaxis 04/11/2023   PAF (paroxysmal atrial fibrillation) (HCC) 02/24/2023   Near syncope 02/24/2023   (HFpEF) heart failure with preserved ejection fraction (HCC) 01/17/2023   Left shoulder pain 11/01/2021   Shortness of breath 11/01/2021   Candidiasis of skin 03/17/2019   Asthmatic bronchitis 08/06/2018   Mild cardiomegaly 01/03/2018   Arthralgia of right temporomandibular joint 03/28/2017   Otalgia, right 03/28/2017   Well adult exam 12/14/2015   Acute UTI 12/14/2015   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   Bacterial pharyngitis 08/25/2014   Acute bronchitis 07/03/2014   Oral thrush 07/03/2014   Onychomycosis 02/18/2013   URI (upper respiratory  infection) 07/11/2012   Cough 07/11/2012   Ganglion cyst 06/25/2011   Foot pain, left 06/25/2011   Otitis media 06/25/2011   Lipoma 11/03/2010   EUSTACHIAN TUBE DYSFUNCTION, RIGHT 02/18/2010   Asthma with acute exacerbation 11/02/2009   ONYCHOMYCOSIS, TOENAILS 05/18/2009   Hyperlipidemia 09/15/2008   DM2 (diabetes mellitus, type 2) (HCC) 04/19/2007   Primary hypertension 04/19/2007   Allergic rhinitis 04/19/2007   OSTEOARTHRITIS 04/19/2007    Past Surgical History:  Procedure Laterality Date   ACNE CYST REMOVAL  1970   back   APPENDECTOMY  1963   CARDIOVERSION N/A 08/16/2023   Procedure: CARDIOVERSION (CATH LAB);  Surgeon: Barbaraann Darryle Ned, MD;  Location: [facility] INVASIVE CV LAB;  Service: Cardiovascular;  Laterality: N/A;   LEFT HEART CATH AND CORONARY ANGIOGRAPHY N/A 02/25/2023   Procedure: LEFT HEART CATH AND CORONARY ANGIOGRAPHY;  Surgeon: Verlin Lonni BIRCH, MD;  Location: [facility] INVASIVE CV LAB;  Service: Cardiovascular;  Laterality: N/A;   OOPHORECTOMY  1985   TRANSESOPHAGEAL ECHOCARDIOGRAM (CATH LAB) N/A 08/16/2023   Procedure: TRANSESOPHAGEAL ECHOCARDIOGRAM;  Surgeon: Barbaraann Darryle Ned, MD;  Location: [facility] INVASIVE CV LAB;  Service: Cardiovascular;  Laterality: N/A;   VAGINAL HYSTERECTOMY  1985   complete    OB History   No obstetric history on file.      Home Medications    Prior to Admission medications  Medication Sig Start Date End Date Taking? Authorizing Provider  promethazine -dextromethorphan (PROMETHAZINE -DM) 6.25-15 MG/5ML  syrup Take 5 mLs by mouth 4 (four) times daily as needed. 09/18/24  Yes Andra Krabbe C, PA-C  acetaminophen  (TYLENOL ) 500 MG tablet Take 500 mg by mouth every 8 (eight) hours as needed for mild pain or headache.    [provider]  albuterol  (VENTOLIN  HFA) 108 (90 Base) MCG/ACT inhaler INHALE 1-2 PUFFS INTO THE LUNGS EVERY 6 (SIX) HOURS AS NEEDED FOR WHEEZING OR SHORTNESS OF BREATH. INHALE 1-2 PUFFS BY MOUTH EVERY 6  HOURS AS NEEDED FOR WHEEZE OR SHORTNESS OF BREATH 01/06/24   Jaycee Greig PARAS, NP  azelastine  (ASTELIN ) 0.1 % nasal spray Place 2 sprays into both nostrils at bedtime. Use in each nostril as directed 07/29/24   Alghanim, Paula, MD  benzonatate  (TESSALON ) 100 MG capsule Take 1 capsule (100 mg total) by mouth 3 (three) times daily as needed for cough. 03/20/24   Christopher Savannah, PA-C  Blood Pressure KIT Check blood pressure twice a day Dx: RAYVON 01/23/24   Raford Riggs, MD  budesonide  (PULMICORT ) 0.25 MG/2ML nebulizer solution Take 2 mLs (0.25 mg total) by nebulization 2 (two) times daily. 03/04/23   Jaycee Greig PARAS, NP  Continuous Glucose Receiver (FREESTYLE LIBRE 3 READER) DEVI 1 each by Does not apply route 4 (four) times daily -  before meals and at bedtime. 10/31/23   Jaycee Greig PARAS, NP  Continuous Glucose Sensor (FREESTYLE LIBRE 3 PLUS SENSOR) MISC USE 1 SENSOR AND CHANGE EVERY 15 DAYS TO CHECK BLOOD SUGAR 4 TIMES DAILY BEFORE MEALS AND BEDTIME. 06/29/24   Jaycee Greig PARAS, NP  diclofenac  Sodium (VOLTAREN ) 1 % GEL Apply 2 g topically 4 (four) times daily as needed. 11/01/21   Raford Riggs, MD  diltiazem  (CARDIZEM  CD) 180 MG 24 hr capsule TAKE 1 CAPSULE BY MOUTH EVERY DAY 07/15/24   Terra Fairy PARAS, PA-C  ELIQUIS  5 MG TABS tablet TAKE 1 TABLET BY MOUTH TWICE A DAY 06/19/24   Walker, Caitlin S, NP  famotidine  (PEPCID ) 20 MG tablet Take 1 tablet (20 mg total) by mouth 2 (two) times daily. 07/29/24   Alghanim, Fahid, MD  ipratropium (ATROVENT  HFA) 17 MCG/ACT inhaler Inhale 1 puff into the lungs 3 (three) times daily. 10/11/23   Jaycee Greig PARAS, NP  ipratropium-albuterol  (DUONEB) 0.5-2.5 (3) MG/3ML SOLN Take 3 mLs by nebulization every 4 (four) hours as needed. 03/20/24   Christopher Savannah, PA-C  loratadine  (CLARITIN ) 10 MG tablet TAKE 1 TABLET BY MOUTH EVERY DAY (DRUG NOT COVERED BY INS) 05/20/23   Jaycee Greig PARAS, NP  Multiple Vitamins-Minerals (CENTRUM SILVER 50+WOMEN) TABS Take 1 tablet by mouth daily.    [provider]  nystatin  (MYCOSTATIN /NYSTOP ) powder Apply topically 4 (four) times daily. Patient not taking: Reported on 08/12/2024 03/17/19   Geofm Glade PARAS, MD  predniSONE  (DELTASONE ) 20 MG tablet Take 1 tablet (20 mg total) by mouth daily with breakfast. Patient not taking: Reported on 08/12/2024 03/20/24   Christopher Savannah, PA-C  rosuvastatin  (CRESTOR ) 5 MG tablet Take 1 tablet (5 mg total) by mouth daily. 03/11/24   Raford Riggs, MD  sodium chloride  (OCEAN) 0.65 % SOLN nasal spray Place 1 spray into both nostrils in the morning, at noon, in the evening, and at bedtime. 11/22/23   Soldatova, Liuba, MD  triamcinolone  (KENALOG ) 0.025 % cream Apply 1 Application topically 2 (two) times daily. 07/14/24   Jaycee Greig PARAS, NP  valsartan -hydrochlorothiazide  (DIOVAN  HCT) 160-25 MG tablet Take 1 tablet by mouth daily. 11/18/23   Raford Riggs, MD    Family  History Family History  Problem Relation Age of Onset   Diabetes Mother    Heart attack Mother    Cancer Father        brain   Diabetes Sister    Hypertension Sister    Diabetes Brother    Hypertension Brother    Coronary artery disease Brother    Aneurysm Other        niece    Social History Social History[1]   Allergies   Atorvastatin , Glimepiride , and Metformin   Review of Systems Review of Systems   Physical Exam Triage Vital Signs ED Triage Vitals  Encounter Vitals Group     BP 09/18/24 1714 (!) 193/78     Girls Systolic BP Percentile --      Girls Diastolic BP Percentile --      Boys Systolic BP Percentile --      Boys Diastolic BP Percentile --      Pulse Rate 09/18/24 1714 78     Resp 09/18/24 1714 20     Temp 09/18/24 1714 98.3 F (36.8 C)     Temp Source 09/18/24 1714 Oral     SpO2 09/18/24 1714 96 %     Weight 09/18/24 1714 199 lb 8.3 oz (90.5 kg)     Height --      Head Circumference --      Peak Flow --      Pain Score 09/18/24 1713 7     Pain Loc --      Pain Education --      Exclude from Growth Chart --     No data found.  Updated Vital Signs BP (!) 193/78 (BP Location: Left Arm)   Pulse 78   Temp 98.3 F (36.8 C) (Oral)   Resp 20   Wt 199 lb 8.3 oz (90.5 kg)   SpO2 96%   BMI 35.34 kg/m   Visual Acuity Right Eye Distance:   Left Eye Distance:   Bilateral Distance:    Right Eye Near:   Left Eye Near:    Bilateral Near:     Physical Exam Vitals and nursing note reviewed.  Constitutional:      General: She is not in acute distress.    Appearance: Normal appearance. She is not ill-appearing, toxic-appearing or diaphoretic.  HENT:     Nose: Congestion (moderately enlarged) present. No rhinorrhea.     Mouth/Throat:     Mouth: Mucous membranes are moist.     Pharynx: Oropharynx is clear. No oropharyngeal exudate or posterior oropharyngeal erythema.  Eyes:     General: No scleral icterus. Cardiovascular:     Rate and Rhythm: Normal rate and regular rhythm.     Heart sounds: Normal heart sounds.  Pulmonary:     Effort: Pulmonary effort is normal. No respiratory distress.     Breath sounds: Normal breath sounds. No wheezing or rhonchi.  Skin:    General: Skin is warm.  Neurological:     Mental Status: She is alert and oriented to person, place, and time.  Psychiatric:        Mood and Affect: Mood normal.        Behavior: Behavior normal.      UC Treatments / Results  Labs (all labs ordered are listed, but only abnormal results are displayed) Labs Reviewed  POCT INFLUENZA A/B - Normal    EKG   Radiology No results found.  Procedures Procedures (including critical care time)  Medications Ordered in UC  Medications - No data to display  Initial Impression / Assessment and Plan / UC Course  I have reviewed the triage vital signs and the nursing notes.  Pertinent labs & imaging results that were available during my care of the patient were reviewed by me and considered in my medical decision making (see chart for details).     Final Clinical  Impressions(s) / UC Diagnoses   Final diagnoses:  Viral URI     Discharge Instructions      You been diagnosed with a viral illness today. -Viruses have to run their course and medicines that are prescribed are meant to help with symptoms. - With viruses usually feel poorly from 3 to 7 days with cough being the last symptoms to resolve.  -Cough can linger from days to weeks.  Antibiotics are not effective for viruses. -If your cough lasts more than 2 weeks and you are coughing so hard that you are vomiting or feel like you could pass out we need to follow-up with PCP for further testing and evaluation. -Rest, increase water intake, may use pseudoephedrine  for nasal congestion, Delsym (dextromethorphan) or honey as needed for cough, and ibuprofen  and/or Tylenol  as directed on packaging for pain and fever. -If you have hypertension you should take Coricidin or other OTC meds approved for people with high blood pressure. -You may use a spoonful of honey every 4-6 hours as needed for throat pain and cough. -Warm tea with honey and lemon are helpful for soothe throat as well.  Chloraseptic and Cepacol make a throat lozenge with numbing medication, can be purchased over-the-counter. -May also use Flonase  or sinus rinse for sinus pressure or nasal congestion.  Be sure to use distilled bottled water for sinus rinses. -May use coolmist humidifier to open up nasal passages -May elevate head to assist with postnasal drainage. -If you feel poorly (fever, fatigue, shortness of breath, nausea, etc.) for more than 10 days to be sure to follow-up with PCP or in clinic for further evaluation and additional treatments. If you experience chest pain with shortness of breath or pulse oxygen less than 95% you should report to the ER.    ED Prescriptions     Medication Sig Dispense Auth. Provider   promethazine -dextromethorphan (PROMETHAZINE -DM) 6.25-15 MG/5ML syrup Take 5 mLs by mouth 4 (four) times daily as  needed. 118 mL Andra Corean BROCKS, PA-C      PDMP not reviewed this encounter.    [1]  Social History Tobacco Use   Smoking status: Never    Passive exposure: Never   Smokeless tobacco: Never   Tobacco comments:    Never smoked 08/21/23  Vaping Use   Vaping status: Never Used  Substance Use Topics   Alcohol use: Yes    Alcohol/week: 1.0 standard drink of alcohol    Types: 1 Glasses of wine per week    Comment: ocassionally   Drug use: No     Andra Corean BROCKS, PA-C 09/18/24 1800  "

## 2024-09-20 ENCOUNTER — Emergency Department (HOSPITAL_BASED_OUTPATIENT_CLINIC_OR_DEPARTMENT_OTHER)
Admission: EM | Admit: 2024-09-20 | Discharge: 2024-09-20 | Disposition: A | Discharge status: Home / Self Care | Attending: Emergency Medicine | Admitting: Emergency Medicine

## 2024-09-20 ENCOUNTER — Encounter (HOSPITAL_BASED_OUTPATIENT_CLINIC_OR_DEPARTMENT_OTHER): Payer: Self-pay

## 2024-09-20 ENCOUNTER — Emergency Department (HOSPITAL_BASED_OUTPATIENT_CLINIC_OR_DEPARTMENT_OTHER): Admitting: Radiology

## 2024-09-20 DIAGNOSIS — E119 Type 2 diabetes mellitus without complications: Secondary | ICD-10-CM | POA: Insufficient documentation

## 2024-09-20 DIAGNOSIS — D649 Anemia, unspecified: Secondary | ICD-10-CM | POA: Diagnosis not present

## 2024-09-20 DIAGNOSIS — J189 Pneumonia, unspecified organism: Secondary | ICD-10-CM | POA: Insufficient documentation

## 2024-09-20 DIAGNOSIS — J101 Influenza due to other identified influenza virus with other respiratory manifestations: Secondary | ICD-10-CM | POA: Diagnosis not present

## 2024-09-20 DIAGNOSIS — I10 Essential (primary) hypertension: Secondary | ICD-10-CM | POA: Insufficient documentation

## 2024-09-20 DIAGNOSIS — I4891 Unspecified atrial fibrillation: Secondary | ICD-10-CM | POA: Insufficient documentation

## 2024-09-20 DIAGNOSIS — R059 Cough, unspecified: Secondary | ICD-10-CM | POA: Diagnosis present

## 2024-09-20 DIAGNOSIS — R109 Unspecified abdominal pain: Secondary | ICD-10-CM | POA: Diagnosis not present

## 2024-09-20 DIAGNOSIS — I2510 Atherosclerotic heart disease of native coronary artery without angina pectoris: Secondary | ICD-10-CM | POA: Diagnosis not present

## 2024-09-20 DIAGNOSIS — Z79899 Other long term (current) drug therapy: Secondary | ICD-10-CM | POA: Diagnosis not present

## 2024-09-20 DIAGNOSIS — Z7901 Long term (current) use of anticoagulants: Secondary | ICD-10-CM | POA: Diagnosis not present

## 2024-09-20 LAB — CBC WITH DIFFERENTIAL/PLATELET
Abs Immature Granulocytes: 0.02 K/uL (ref 0.00–0.07)
Basophils Absolute: 0 K/uL (ref 0.0–0.1)
Basophils Relative: 0 %
Eosinophils Absolute: 0 K/uL (ref 0.0–0.5)
Eosinophils Relative: 0 %
HCT: 32.8 % — ABNORMAL LOW (ref 36.0–46.0)
Hemoglobin: 10.8 g/dL — ABNORMAL LOW (ref 12.0–15.0)
Immature Granulocytes: 0 %
Lymphocytes Relative: 12 %
Lymphs Abs: 1 K/uL (ref 0.7–4.0)
MCH: 27.5 pg (ref 26.0–34.0)
MCHC: 32.9 g/dL (ref 30.0–36.0)
MCV: 83.5 fL (ref 80.0–100.0)
Monocytes Absolute: 0.7 K/uL (ref 0.1–1.0)
Monocytes Relative: 8 %
Neutro Abs: 6.7 K/uL (ref 1.7–7.7)
Neutrophils Relative %: 80 %
Platelets: 282 K/uL (ref 150–400)
RBC: 3.93 MIL/uL (ref 3.87–5.11)
RDW: 15.2 % (ref 11.5–15.5)
WBC: 8.4 K/uL (ref 4.0–10.5)
nRBC: 0 % (ref 0.0–0.2)

## 2024-09-20 LAB — BASIC METABOLIC PANEL WITH GFR
Anion gap: 12 (ref 5–15)
BUN: 20 mg/dL (ref 8–23)
CO2: 22 mmol/L (ref 22–32)
Calcium: 9.9 mg/dL (ref 8.9–10.3)
Chloride: 98 mmol/L (ref 98–111)
Creatinine, Ser: 1.11 mg/dL — ABNORMAL HIGH (ref 0.44–1.00)
GFR, Estimated: 50 mL/min — ABNORMAL LOW
Glucose, Bld: 161 mg/dL — ABNORMAL HIGH (ref 70–99)
Potassium: 4 mmol/L (ref 3.5–5.1)
Sodium: 132 mmol/L — ABNORMAL LOW (ref 135–145)

## 2024-09-20 LAB — LACTIC ACID, PLASMA: Lactic Acid, Venous: 0.7 mmol/L (ref 0.5–1.9)

## 2024-09-20 LAB — RESP PANEL BY RT-PCR (RSV, FLU A&B, COVID)  RVPGX2
Influenza A by PCR: POSITIVE — AB
Influenza B by PCR: NEGATIVE
Resp Syncytial Virus by PCR: NEGATIVE
SARS Coronavirus 2 by RT PCR: NEGATIVE

## 2024-09-20 MED ORDER — AZITHROMYCIN 250 MG PO TABS
500.0000 mg | ORAL_TABLET | Freq: Once | ORAL | Status: AC
  Administered 2024-09-20: 500 mg via ORAL
  Filled 2024-09-20: qty 2

## 2024-09-20 MED ORDER — LACTATED RINGERS IV BOLUS: 500.0000 mL | Freq: Once | INTRAVENOUS | Status: DC

## 2024-09-20 MED ORDER — BENZONATATE 100 MG PO CAPS
100.0000 mg | ORAL_CAPSULE | Freq: Once | ORAL | Status: AC
  Administered 2024-09-20: 100 mg via ORAL
  Filled 2024-09-20: qty 1

## 2024-09-20 MED ORDER — IPRATROPIUM-ALBUTEROL 0.5-2.5 (3) MG/3ML IN SOLN: 3.0000 mL | Freq: Once | RESPIRATORY_TRACT | Status: DC

## 2024-09-20 MED ORDER — OSELTAMIVIR PHOSPHATE 75 MG PO CAPS
75.0000 mg | ORAL_CAPSULE | Freq: Once | ORAL | Status: AC
  Administered 2024-09-20: 75 mg via ORAL
  Filled 2024-09-20: qty 1

## 2024-09-20 MED ORDER — OSELTAMIVIR PHOSPHATE 30 MG PO CAPS: 30.0000 mg | ORAL_CAPSULE | Freq: Two times a day (BID) | ORAL | 0 refills | Status: AC

## 2024-09-20 MED ORDER — TIZANIDINE HCL 2 MG PO TABS
2.0000 mg | ORAL_TABLET | Freq: Once | ORAL | Status: AC
  Administered 2024-09-20: 2 mg via ORAL
  Filled 2024-09-20: qty 1

## 2024-09-20 MED ORDER — BENZONATATE 100 MG PO CAPS: 100.0000 mg | ORAL_CAPSULE | Freq: Three times a day (TID) | ORAL | 0 refills | Status: AC | PRN

## 2024-09-20 MED ORDER — AMOXICILLIN-POT CLAVULANATE 875-125 MG PO TABS
1.0000 | ORAL_TABLET | Freq: Once | ORAL | Status: AC
  Administered 2024-09-20: 1 via ORAL
  Filled 2024-09-20: qty 1

## 2024-09-20 MED ORDER — TIZANIDINE HCL 4 MG PO TABS: 2.0000 mg | ORAL_TABLET | Freq: Three times a day (TID) | ORAL | 0 refills | Status: AC | PRN

## 2024-09-20 MED ORDER — AZITHROMYCIN 250 MG PO TABS: 250.0000 mg | ORAL_TABLET | Freq: Every day | ORAL | 0 refills | Status: AC

## 2024-09-20 MED ORDER — AMOXICILLIN-POT CLAVULANATE 875-125 MG PO TABS: 1.0000 | ORAL_TABLET | Freq: Two times a day (BID) | ORAL | 0 refills | Status: AC

## 2024-09-20 NOTE — ED Provider Notes (Signed)
 " [place] EMERGENCY DEPARTMENT AT [facility] Provider Note   CSN: [id number] Arrival date & time: 09/20/24  0930     History Chief Complaint  Patient presents with   URI    HPI: Rachel [name] is a 83 y.o. female with history pertinent HFpEF, A-fib, CAD, T2DM, HTN, HLD who presents complaining of multiple symptoms. Patient arrived via POV, accompanied by granddaughter, Brittany..  History provided by patient.  No interpreter required during this encounter.  Patient and daughter report that patient has had symptoms since 12/25.  Reports that since then she has had malaise, fatigue, nonproductive cough, poor p.o. intake, diarrhea.  Reports that she has chest wall pain and abdominal wall pain specifically while coughing, denies any substernal chest pain or pressure.  Reports that the pain is similar to a muscular ache, and is reproducible with palpation.  Endorses chills, denies fevers.  Patient's recorded medical, surgical, social, medication list and allergies were reviewed in the Snapshot window as part of the initial history.   Prior to Admission medications  Medication Sig Start Date End Date Taking? Authorizing Provider  amoxicillin -clavulanate (AUGMENTIN ) 875-125 MG tablet Take 1 tablet by mouth every 12 (twelve) hours. 09/20/24  Yes Rogelia Jerilynn RAMAN, MD  azithromycin  (ZITHROMAX ) 250 MG tablet Take 1 tablet (250 mg total) by mouth daily for 4 days. Take one tablet daily 09/20/24 09/24/24 Yes Rogelia Jerilynn RAMAN, MD  benzonatate  (TESSALON ) 100 MG capsule Take 1 capsule (100 mg total) by mouth 3 (three) times daily as needed for cough. 09/20/24  Yes Rogelia Jerilynn RAMAN, MD  oseltamivir  (TAMIFLU ) 30 MG capsule Take 1 capsule (30 mg total) by mouth every 12 (twelve) hours for 5 days. 09/20/24 09/25/24 Yes Rogelia Jerilynn RAMAN, MD  tiZANidine  (ZANAFLEX ) 4 MG tablet Take 0.5 tablets (2 mg total) by mouth every 8 (eight) hours as needed for muscle spasms. 09/20/24  Yes  Rogelia Jerilynn RAMAN, MD  acetaminophen  (TYLENOL ) 500 MG tablet Take 500 mg by mouth every 8 (eight) hours as needed for mild pain or headache.    [provider]  albuterol  (VENTOLIN  HFA) 108 (90 Base) MCG/ACT inhaler INHALE 1-2 PUFFS INTO THE LUNGS EVERY 6 (SIX) HOURS AS NEEDED FOR WHEEZING OR SHORTNESS OF BREATH. INHALE 1-2 PUFFS BY MOUTH EVERY 6 HOURS AS NEEDED FOR WHEEZE OR SHORTNESS OF BREATH 01/06/24   Jaycee Greig PARAS, NP  azelastine  (ASTELIN ) 0.1 % nasal spray Place 2 sprays into both nostrils at bedtime. Use in each nostril as directed 07/29/24   Alghanim, Paula, MD  Blood Pressure KIT Check blood pressure twice a day Dx: RAYVON 01/23/24   Raford Riggs, MD  budesonide  (PULMICORT ) 0.25 MG/2ML nebulizer solution Take 2 mLs (0.25 mg total) by nebulization 2 (two) times daily. 03/04/23   Jaycee Greig PARAS, NP  Continuous Glucose Receiver (FREESTYLE LIBRE 3 READER) DEVI 1 each by Does not apply route 4 (four) times daily -  before meals and at bedtime. 10/31/23   Jaycee Greig PARAS, NP  Continuous Glucose Sensor (FREESTYLE LIBRE 3 PLUS SENSOR) MISC USE 1 SENSOR AND CHANGE EVERY 15 DAYS TO CHECK BLOOD SUGAR 4 TIMES DAILY BEFORE MEALS AND BEDTIME. 06/29/24   Jaycee Greig PARAS, NP  diclofenac  Sodium (VOLTAREN ) 1 % GEL Apply 2 g topically 4 (four) times daily as needed. 11/01/21   Raford Riggs, MD  diltiazem  (CARDIZEM  CD) 180 MG 24 hr capsule TAKE 1 CAPSULE BY MOUTH EVERY DAY 07/15/24   Terra Fairy PARAS, PA-C  ELIQUIS  5 MG TABS  tablet TAKE 1 TABLET BY MOUTH TWICE A DAY 06/19/24   Walker, Caitlin S, NP  famotidine  (PEPCID ) 20 MG tablet Take 1 tablet (20 mg total) by mouth 2 (two) times daily. 07/29/24   Alghanim, Fahid, MD  ipratropium (ATROVENT  HFA) 17 MCG/ACT inhaler Inhale 1 puff into the lungs 3 (three) times daily. 10/11/23   Jaycee Greig PARAS, NP  ipratropium-albuterol  (DUONEB) 0.5-2.5 (3) MG/3ML SOLN Take 3 mLs by nebulization every 4 (four) hours as needed. 03/20/24   Christopher Savannah, PA-C  loratadine  (CLARITIN ) 10 MG  tablet TAKE 1 TABLET BY MOUTH EVERY DAY (DRUG NOT COVERED BY INS) 05/20/23   Jaycee Greig PARAS, NP  Multiple Vitamins-Minerals (CENTRUM SILVER 50+WOMEN) TABS Take 1 tablet by mouth daily.    [provider]  nystatin  (MYCOSTATIN /NYSTOP ) powder Apply topically 4 (four) times daily. Patient not taking: Reported on 08/12/2024 03/17/19   Geofm Glade PARAS, MD  predniSONE  (DELTASONE ) 20 MG tablet Take 1 tablet (20 mg total) by mouth daily with breakfast. Patient not taking: Reported on 08/12/2024 03/20/24   Christopher Savannah, PA-C  promethazine -dextromethorphan (PROMETHAZINE -DM) 6.25-15 MG/5ML syrup Take 5 mLs by mouth 4 (four) times daily as needed. 09/18/24   Andra Corean BROCKS, PA-C  rosuvastatin  (CRESTOR ) 5 MG tablet Take 1 tablet (5 mg total) by mouth daily. 03/11/24   Raford Riggs, MD  sodium chloride  (OCEAN) 0.65 % SOLN nasal spray Place 1 spray into both nostrils in the morning, at noon, in the evening, and at bedtime. 11/22/23   Soldatova, Liuba, MD  triamcinolone  (KENALOG ) 0.025 % cream Apply 1 Application topically 2 (two) times daily. 07/14/24   Jaycee Greig PARAS, NP  valsartan -hydrochlorothiazide  (DIOVAN  HCT) 160-25 MG tablet Take 1 tablet by mouth daily. 11/18/23   Raford Riggs, MD     Allergies: Atorvastatin , Glimepiride , and Metformin   Review of Systems   ROS as per HPI  Physical Exam Updated Vital Signs BP (!) 173/64   Pulse 73   Temp 98.7 F (37.1 C)   Resp 18   SpO2 96%  Physical Exam Vitals and nursing note reviewed.  Constitutional:      General: She is not in acute distress.    Appearance: She is well-developed.  HENT:     Head: Normocephalic and atraumatic.  Eyes:     Conjunctiva/sclera: Conjunctivae normal.  Cardiovascular:     Rate and Rhythm: Normal rate and regular rhythm.     Heart sounds: No murmur heard. Pulmonary:     Effort: Pulmonary effort is normal. No respiratory distress.     Breath sounds: Normal breath sounds.  Abdominal:     Palpations:  Abdomen is soft.     Tenderness: There is no abdominal tenderness.  Musculoskeletal:        General: No swelling.     Cervical back: Neck supple.  Skin:    General: Skin is warm and dry.     Capillary Refill: Capillary refill takes less than 2 seconds.  Neurological:     Mental Status: She is alert.  Psychiatric:        Mood and Affect: Mood normal.     ED Course/ Medical Decision Making/ A&P    Procedures Procedures   Medications Ordered in ED Medications  oseltamivir  (TAMIFLU ) capsule 75 mg (75 mg Oral Given 09/20/24 1244)  amoxicillin -clavulanate (AUGMENTIN ) 875-125 MG per tablet 1 tablet (1 tablet Oral Given 09/20/24 1526)  azithromycin  (ZITHROMAX ) tablet 500 mg (500 mg Oral Given 09/20/24 1526)  benzonatate  (TESSALON ) capsule 100 mg (  100 mg Oral Given 09/20/24 1525)  tiZANidine  (ZANAFLEX ) tablet 2 mg (2 mg Oral Given 09/20/24 1525)    Medical Decision Making:   Mayling Aber is a 83 y.o. female who presents for multiple symptoms as per above.  Physical exam is pertinent for no focal abnormalities.   The differential includes but is not limited to viral illness, sepsis, flu, dehydration, AKI, metabolic derangement.  Independent historian: Relative: Granddaughter  External data reviewed: Labs: reviewed prior labs for baseline  Initial Plan:  Screening labs including CBC and Metabolic panel to evaluate for infectious or metabolic etiology of disease.  Screening lactic to evaluate for sepsis Chest x-ray to evaluate for structural/infectious intra-thoracic pathology.  EKG to evaluate for cardiac pathology Objective evaluation as below reviewed   Labs: Ordered, Independent interpretation, and Details: RVP positive for flu A.  CBC without leukocytosis, thrombocytopenia.  Mild stable anemia on comparison to prior.  Lactic acid WNL.SABRA  BMP with creatinine at baseline.  No emergent electrolyte derangement.  Radiology: Ordered, Independent interpretation,  Details: No dense airspace opacification, cardiomediastinal silhouette derangement, pneumothorax, pleural effusion, bony derangement, and All images reviewed independently.  Agree with radiology report at this time.   DG Chest 2 View Result Date: 09/20/2024 CLINICAL DATA:  Upper respiratory infection symptoms x2 days. EXAM: CHEST - 2 VIEW COMPARISON:  July 29, 2024 FINDINGS: The heart size and mediastinal contours are within normal limits. Mildly increased suprahilar and infrahilar lung markings are seen. Mild linear atelectasis and/or infiltrate is noted within the left lung base. No pleural effusion or pneumothorax is identified. Multilevel degenerative changes are present throughout the thoracic spine. IMPRESSION: 1. Mild left basilar linear atelectasis and/or infiltrate. 2. Additional findings suggestive of viral bronchitis versus reactive airway disease. Electronically Signed   By: Suzen Dials M.D.   On: 09/20/2024 11:34    EKG/Medicine tests: Ordered and Independent interpretation EKG Interpretation:                  Interventions: Tamiflu , tizanidine , Tessalon  Perles, azithromycin , Augmentin   See the EMR for full details regarding lab and imaging results.  Presents to the emergency department for multiple symptoms.  Also reports that she has had poor p.o. intake and feels increasingly tired.  Patient underwent chest x-ray as well as RVP in triage and patient does have evidence of influenza A.  Additionally chest x-ray concerning for possible early developing pneumonia on chest x-ray.  Given possible pneumonia, as well as poor p.o., considered fluid bolus, however patient difficult stick, therefore will orally rehydrate.  Patient provided Tessalon  Perles as well as Zanaflex  for musculoskeletal chest wall pain.  Labs obtained and do not demonstrate evidence of metabolic derangement, significant dehydration.  Patient with no leukocytosis, no lactic acid elevation, therefore doubt sepsis.   Feel that patient is stable for outpatient management of influenza and possible early developing pneumonia.  Discussed this with patient and granddaughter at bedside.  Will treat pneumonia with course of Augmentin  and azithromycin .  First dose provided in the ED, and prescription sent to preferred pharmacy.  Patient did request a breathing treatment on my reevaluation, patient feels that it may help her chest wall pain, patient did not have any wheezing, feel that tizanidine  and Tessalon  would better help patient's muscular pain, patient amenable to this plan, has available inhalers at home, thus breathing treatment deferred and patient and daughter are agreeable to this plan.  Presentation is most consistent with acute complicated illness and I did consider and rule out  acute life/limb-threatening illness  Discussion of management or test interpretations with external provider(s): Not indicated  Risk Drugs:Prescription drug management  Disposition: DISCHARGE: I believe that the patient is safe for discharge home with outpatient follow-up. Patient was informed of all pertinent physical exam, laboratory, and imaging findings. Patient's suspected etiology of their symptom presentation was discussed with the patient and all questions were answered. We discussed following up with PCP. I provided thorough ED return precautions. The patient feels safe and comfortable with this plan.  MDM generated using voice dictation software and may contain dictation errors.  Please contact me for any clarification or with any questions.  Clinical Impression:  1. Influenza A   2. Pneumonia of left lung due to infectious organism, unspecified part of lung      Discharge   Final Clinical Impression(s) / ED Diagnoses Final diagnoses:  Influenza A  Pneumonia of left lung due to infectious organism, unspecified part of lung    Rx / DC Orders ED Discharge Orders          Ordered    amoxicillin -clavulanate  (AUGMENTIN ) 875-125 MG tablet  Every 12 hours        09/20/24 1459    azithromycin  (ZITHROMAX ) 250 MG tablet  Daily        09/20/24 1459    benzonatate  (TESSALON ) 100 MG capsule  3 times daily PRN        09/20/24 1459    oseltamivir  (TAMIFLU ) 30 MG capsule  Every 12 hours        09/20/24 1459    tiZANidine  (ZANAFLEX ) 4 MG tablet  Every 8 hours PRN        09/20/24 1459             Rogelia Jerilynn RAMAN, MD 09/20/24 1552  "

## 2024-09-20 NOTE — Discharge Instructions (Signed)
 Rachel [name]  Thank you for allowing us  to take care of you today.  You came to the Emergency Department today because you have had several days of cough, chest wall pain, congestion.  Here in the emergency department you were positive for flu A, therefore we are going to start you on an antiflu medication called Tamiflu , which reduces the risk of developing a severe infection from the flu.  Your chest x-ray also shows that you possibly have an early developing pneumonia, however your blood work was reassuring you do not have elevated white blood cell count, acid level, and you do not have signs of dehydration.  We recommend that you drink plenty of fluids by mouth.  For the early pneumonia we are putting you on 2 antibiotics called azithromycin  that you will take daily for the next 4 days, as well as Augmentin  that you will take twice a day for the next 7 days.  Your pain is likely muscular pain from your muscles and bones of your chest from where you are coughing so much.  We recommend Tylenol  and ibuprofen  every 4-6 hours as needed for pain or fever.  Additionally we are prescribing Tessalon  Perles, a medication that can help reduce cough and some patients.  Additionally you are prescribing tizanidine , this is a muscle relaxer that can reduce muscle pain and spasms in your chest wall which may also be contributing to your chest wall pain.  To-Do: 1. Please follow-up with your primary doctor within 1 - 2 weeks / as soon as possible.   Please return to the Emergency Department or call 911 if you experience have worsening of your symptoms, or do not get better, chest pain, shortness of breath, severe or significantly worsening pain, high fever, severe confusion, pass out or have any reason to think that you need emergency medical care.   We hope you feel better soon.   Mitzie Later, MD Department of Emergency Medicine [facility]

## 2024-09-20 NOTE — ED Provider Triage Note (Signed)
 Emergency Medicine Provider Triage Evaluation Note  Rachel [name] , a 83 y.o. female  was evaluated in triage.  Pt complains of cough for the past 2 days.  Denies fever or chills.  Denies any sick contacts.  Review of Systems  Positive: As above Negative: As above  Physical Exam  BP (!) 173/64   Pulse 73   Temp 98.7 F (37.1 C)   Resp 18   SpO2 96%  Gen:   Awake, no distress   Resp:  Normal effort  MSK:   Moves extremities without difficulty    Medical Decision Making  Medically screening exam initiated at 7:14 PM.  Appropriate orders placed.  Rachel [name] was informed that the remainder of the evaluation will be completed by another provider, this initial triage assessment does not replace that evaluation, and the importance of remaining in the ED until their evaluation is complete.     Veta Palma, PA-C 09/20/24 1914

## 2024-09-20 NOTE — ED Triage Notes (Signed)
 She c/o uri sx x 2 days. She c/o lower ribs area pain because I been coughing so much [sic].

## 2024-09-21 NOTE — Telephone Encounter (Signed)
 Report to Emergency Department/Urgent Care/call 911 for immediate medical evaluation. Follow-up with Primary Care.

## 2024-09-21 NOTE — Telephone Encounter (Signed)
 Patient seen already at [facility].

## 2024-09-24 ENCOUNTER — Ambulatory Visit: Payer: Self-pay | Admitting: Cardiovascular Disease

## 2024-09-29 NOTE — Progress Notes (Signed)
 Rachel [name]                                          MRN: [id number]   09/29/2024   The [facility] Quality Team Specialist reviewed this patient medical record for the purposes of chart review for care gap closure. The following were reviewed: chart review for care gap closure-controlling blood pressure.    [facility] Quality Team

## 2024-10-06 ENCOUNTER — Ambulatory Visit (HOSPITAL_BASED_OUTPATIENT_CLINIC_OR_DEPARTMENT_OTHER): Admitting: Internal Medicine

## 2024-10-08 ENCOUNTER — Other Ambulatory Visit: Payer: Self-pay | Admitting: Family

## 2024-10-08 DIAGNOSIS — E1165 Type 2 diabetes mellitus with hyperglycemia: Secondary | ICD-10-CM

## 2024-10-09 NOTE — Telephone Encounter (Signed)
 Complete

## 2024-10-13 ENCOUNTER — Telehealth: Payer: Self-pay | Admitting: Cardiovascular Disease

## 2024-10-13 NOTE — Telephone Encounter (Signed)
 Pt is calling in for clearance to hold her eliquis  for cataract surgery.   Pt states cataract sugery is still TBD at this time.   Advised the pt to call her [facility] office and have them fax us  a cardiac/medication clearance form to [phone] ATTN: Dr. Raford.  Advised the pt to have them include in the form which medication they would like for us  to advise on pt holding prior to the eye procedure, and for how long.    Pt states she will call their office now, to fax the clearance form.  Pt verbalized understanding and agrees with this plan.  Pt was more than gracious for all the assistance provided.

## 2024-10-13 NOTE — Telephone Encounter (Signed)
 Patient calling to see if its okay for her to have eye cardiac surgery. Please advise

## 2024-10-14 ENCOUNTER — Ambulatory Visit: Admitting: Family

## 2024-10-15 ENCOUNTER — Other Ambulatory Visit (HOSPITAL_COMMUNITY): Payer: Self-pay | Admitting: *Deleted

## 2024-10-15 MED ORDER — DILTIAZEM HCL ER COATED BEADS 180 MG PO CP24: 180.0000 mg | ORAL_CAPSULE | Freq: Every day | ORAL | 0 refills | Status: AC

## 2024-10-16 LAB — OPHTHALMOLOGY REPORT-SCANNED

## 2024-10-20 ENCOUNTER — Ambulatory Visit: Payer: Self-pay | Admitting: Family

## 2024-10-28 ENCOUNTER — Other Ambulatory Visit (HOSPITAL_BASED_OUTPATIENT_CLINIC_OR_DEPARTMENT_OTHER): Payer: Self-pay | Admitting: Cardiovascular Disease

## 2024-10-29 ENCOUNTER — Other Ambulatory Visit (HOSPITAL_BASED_OUTPATIENT_CLINIC_OR_DEPARTMENT_OTHER): Payer: Self-pay | Admitting: Pulmonary Disease

## 2024-10-29 DIAGNOSIS — K219 Gastro-esophageal reflux disease without esophagitis: Secondary | ICD-10-CM

## 2024-11-11 ENCOUNTER — Encounter (HOSPITAL_BASED_OUTPATIENT_CLINIC_OR_DEPARTMENT_OTHER)

## 2024-11-11 ENCOUNTER — Ambulatory Visit (HOSPITAL_BASED_OUTPATIENT_CLINIC_OR_DEPARTMENT_OTHER): Admitting: Pulmonary Disease

## 2024-11-25 ENCOUNTER — Encounter (HOSPITAL_BASED_OUTPATIENT_CLINIC_OR_DEPARTMENT_OTHER): Admitting: Cardiovascular Disease

## 2024-12-17 ENCOUNTER — Ambulatory Visit: Admitting: Endocrinology
